# Patient Record
Sex: Female | Born: 1955
Health system: Southern US, Community
[De-identification: ages and names within clinical notes are randomized; demographics above are authoritative.]

## PROBLEM LIST (undated history)

## (undated) DIAGNOSIS — E872 Acidosis, unspecified: Secondary | ICD-10-CM

## (undated) DIAGNOSIS — R569 Unspecified convulsions: Secondary | ICD-10-CM

## (undated) DIAGNOSIS — F419 Anxiety disorder, unspecified: Secondary | ICD-10-CM

## (undated) DIAGNOSIS — I10 Essential (primary) hypertension: Secondary | ICD-10-CM

## (undated) DIAGNOSIS — R748 Abnormal levels of other serum enzymes: Secondary | ICD-10-CM

## (undated) DIAGNOSIS — G43909 Migraine, unspecified, not intractable, without status migrainosus: Secondary | ICD-10-CM

## (undated) DIAGNOSIS — G629 Polyneuropathy, unspecified: Secondary | ICD-10-CM

## (undated) DIAGNOSIS — M4316 Spondylolisthesis, lumbar region: Secondary | ICD-10-CM

## (undated) DIAGNOSIS — R2681 Unsteadiness on feet: Secondary | ICD-10-CM

## (undated) DIAGNOSIS — F329 Major depressive disorder, single episode, unspecified: Secondary | ICD-10-CM

## (undated) DIAGNOSIS — Z9889 Other specified postprocedural states: Secondary | ICD-10-CM

## (undated) DIAGNOSIS — G4730 Sleep apnea, unspecified: Secondary | ICD-10-CM

## (undated) DIAGNOSIS — N179 Acute kidney failure, unspecified: Secondary | ICD-10-CM

## (undated) DIAGNOSIS — E669 Obesity, unspecified: Secondary | ICD-10-CM

## (undated) DIAGNOSIS — E039 Hypothyroidism, unspecified: Secondary | ICD-10-CM

## (undated) DIAGNOSIS — R112 Nausea with vomiting, unspecified: Secondary | ICD-10-CM

## (undated) DIAGNOSIS — D649 Anemia, unspecified: Secondary | ICD-10-CM

## (undated) DIAGNOSIS — E2740 Unspecified adrenocortical insufficiency: Secondary | ICD-10-CM

## (undated) DIAGNOSIS — K589 Irritable bowel syndrome without diarrhea: Secondary | ICD-10-CM

## (undated) DIAGNOSIS — K8020 Calculus of gallbladder without cholecystitis without obstruction: Secondary | ICD-10-CM

## (undated) DIAGNOSIS — Z973 Presence of spectacles and contact lenses: Secondary | ICD-10-CM

## (undated) DIAGNOSIS — Z8739 Personal history of other diseases of the musculoskeletal system and connective tissue: Secondary | ICD-10-CM

## (undated) DIAGNOSIS — M1990 Unspecified osteoarthritis, unspecified site: Secondary | ICD-10-CM

## (undated) DIAGNOSIS — K859 Acute pancreatitis without necrosis or infection, unspecified: Secondary | ICD-10-CM

## (undated) DIAGNOSIS — K219 Gastro-esophageal reflux disease without esophagitis: Secondary | ICD-10-CM

## (undated) HISTORY — DX: Essential (primary) hypertension: I10

## (undated) HISTORY — PX: COLONOSCOPY W/ BIOPSIES AND POLYPECTOMY: SHX1376

## (undated) HISTORY — PX: NASAL SINUS SURGERY: SHX719

## (undated) HISTORY — DX: Irritable bowel syndrome, unspecified: K58.9

## (undated) HISTORY — PX: BACK SURGERY: SHX140

## (undated) HISTORY — DX: Gastro-esophageal reflux disease without esophagitis: K21.9

## (undated) HISTORY — DX: Unspecified osteoarthritis, unspecified site: M19.90

## (undated) HISTORY — DX: Hypothyroidism, unspecified: E03.9

## (undated) HISTORY — DX: Major depressive disorder, single episode, unspecified: F32.9

## (undated) HISTORY — DX: Obesity, unspecified: E66.9

## (undated) HISTORY — PX: ANKLE GANGLION CYST EXCISION: SHX1148

## (undated) HISTORY — PX: LAPAROSCOPIC CHOLECYSTECTOMY: SUR755

## (undated) HISTORY — DX: Sleep apnea, unspecified: G47.30

## (undated) HISTORY — PX: BREAST SURGERY: SHX581

## (undated) HISTORY — DX: Unspecified convulsions: R56.9

## (undated) HISTORY — DX: Calculus of gallbladder without cholecystitis without obstruction: K80.20

---

## 1990-11-27 HISTORY — PX: TUBAL LIGATION: SHX77

## 1999-12-15 ENCOUNTER — Other Ambulatory Visit: Admission: RE | Admit: 1999-12-15 | Discharge: 1999-12-15 | Payer: Self-pay | Admitting: *Deleted

## 2001-11-27 DIAGNOSIS — R569 Unspecified convulsions: Secondary | ICD-10-CM

## 2001-11-27 HISTORY — DX: Unspecified convulsions: R56.9

## 2002-08-20 ENCOUNTER — Emergency Department (HOSPITAL_COMMUNITY): Admission: EM | Admit: 2002-08-20 | Discharge: 2002-08-20 | Payer: Self-pay

## 2005-01-06 ENCOUNTER — Ambulatory Visit (HOSPITAL_COMMUNITY): Admission: RE | Admit: 2005-01-06 | Discharge: 2005-01-06 | Payer: Self-pay

## 2005-02-01 ENCOUNTER — Encounter: Admission: RE | Admit: 2005-02-01 | Discharge: 2005-02-01 | Payer: Self-pay | Admitting: Gastroenterology

## 2005-05-02 ENCOUNTER — Encounter (INDEPENDENT_AMBULATORY_CARE_PROVIDER_SITE_OTHER): Payer: Self-pay | Admitting: Specialist

## 2005-05-02 ENCOUNTER — Ambulatory Visit (HOSPITAL_COMMUNITY): Admission: RE | Admit: 2005-05-02 | Discharge: 2005-05-02 | Payer: Self-pay | Admitting: General Surgery

## 2006-02-22 ENCOUNTER — Encounter: Admission: RE | Admit: 2006-02-22 | Discharge: 2006-02-22 | Payer: Self-pay | Admitting: Neurology

## 2008-09-21 ENCOUNTER — Ambulatory Visit: Payer: Self-pay | Admitting: Gastroenterology

## 2008-10-05 ENCOUNTER — Ambulatory Visit: Payer: Self-pay | Admitting: Gastroenterology

## 2010-09-13 LAB — IFOBT (OCCULT BLOOD): IFOBT: NEGATIVE

## 2010-11-27 HISTORY — PX: BUNIONECTOMY: SHX129

## 2010-11-27 HISTORY — PX: SHOULDER ARTHROSCOPY W/ ROTATOR CUFF REPAIR: SHX2400

## 2010-11-27 HISTORY — PX: EYE SURGERY: SHX253

## 2011-04-14 NOTE — Op Note (Signed)
NAME:  Crystal Larsen, VALVANO NO.:  [id number]   MEDICAL RECORD NO.:  [id number]          PATIENT TYPE:  AMB   LOCATION:  DAY                          FACILITY:  [facility]   PHYSICIAN:  Adolph Pollack, M.D.DATE OF BIRTH:  1956-10-31   DATE OF PROCEDURE:  05/02/2005  DATE OF DISCHARGE:                                 OPERATIVE REPORT   PREOPERATIVE DIAGNOSIS:  Symptomatic cholelithiasis.   POSTOPERATIVE DIAGNOSIS:  Chronic cholecystitis with cholelithiasis and  choledocholithiasis.   PROCEDURE:  Laparoscopic cholecystectomy with intraoperative cholangiogram.   SURGEON:  Adolph Pollack, M.D.   ASSISTANT:  Lebron Conners, M.D.   ANESTHESIA:  General.   INDICATIONS:  Ms. Wallman is a 55 year old female having some epigastric pain.  She has undergone an evaluation and an ultrasound.  This demonstrated that  she had some gallstones and gallbladder sludge and had a slight abnormality  of one of her liver function tests.  She has also had an upper endoscopy  that showed some mild gastritis.  She now presents for elective laparoscopic  cholecystectomy.  The procedure and the risks were discussed with her  preoperatively.   TECHNIQUE:  She was seen in the holding area and brought to the operating  room and placed supine on the operating table, and a general anesthetic was  administered.  The abdominal wall was sterilely prepped and draped.  There  was a previous transverse subumbilical incision with a little fullness at  the umbilicus.  I injected some dilute Marcaine solution superficially and  deep at this location and reincised the transverse incision through the skin  and subcutaneous tissue.  An incision was made in the midline fascia and the  peritoneum, and the peritoneal cavity was entered under direct vision.  A  pursestring suture of 0 Vicryl was placed around the fascial edges.  A  Hasson trocar was introduced into the peritoneal cavity, and  pneumoperitoneum  was created by insufflation of CO2 gas.  A laparoscope was  then introduced.   The area under the trocar was inspected, and no injuries were noted.  She  was placed in reverse Trendelenburg position, with the right side tilted  slightly upward.  A 10/11 mm trocar was placed through an epigastric  incisions, and two 5 mm trocars placed in the right midlateral abdomen.  The  fundus of the gallbladder was grasped, and filmy adhesions between the  gallbladder, duodenum, and omentum were lysed sharply.  The infundibulum was  grasped using blunt dissection, staying on the gallbladder.  The  infundibulum was mobilized.  Common bile duct was identified.  The cystic  duct was isolated and a window created around it.  There appeared to be a  stone passing the cystic duct, and I tried to milk this up into the  gallbladder.  A clip was placed at the cystic duct/gallbladder junction.  A  small incision was made in the cystic duct, and upon milking it more debris  and small stones were noted.  A cholangiocatheter was then passed through  the anterior abdominal wall and put into the cystic duct, and  cholangiogram  was performed.   Under real-time fluoroscopy, dilute contrast material was injected into the  cystic duct which was of moderate length.  Common hepatic and the right and  left hepatic ducts filled.  Common bile duct filled but had a small filling  defect present in it.  It tapered down, but I could not get it to directly  splash into the duodenum.  The small filling defect was consistent with a  small common bile duct stone, millimeters in size at most.  I subsequently  gave her some intravenous Glucagon and waited two or three minutes, as I  repeated the fluoroscopy and the contrast had splashed through.  I injected  more contrast, and it went through easily without any further evidence of a  further defect.   Following this, I removed the cholangiocatheter, clipped the cystic duct  three  times proximally, and divided it.  I then used blunt dissection to  identify an anterior and posterior branch of the cystic artery close to the  gallbladder, clipped them, and divided them.  The gallbladder was dissected  free from the liver bed intact using electrocautery and placed it in an  Endopouch bag.  The gallbladder fossa was irrigated, and bleeding points  were controlled with cautery.  I inspected the area, evacuated the  irrigation fluid, and noticed no bleeding.  I noticed no bile leak.  The  gallbladder was then removed through the subumbilical port.  I did not  detect an obvious fascial defect, just a fullness at the umbilicus.  I then  closed the fascial defect under laparoscopic vision by tightening up and  tying down the pursestring suture.  The perihepatic area was once again  irrigated.  Fluid returned clear, without evidence of bile leak or bleeding.  The remaining trocars were removed, and the pneumoperitoneum was released.  The skin incisions were closed with 4-0 Monocryl subcuticular stitches,  followed by Steri-Strips and sterile dressings.   She tolerated the procedure well, without any apparent complications, and  was taken to the recovery room in satisfactory condition.      TJR/MEDQ  D:  05/02/2005  T:  05/02/2005  Job:  [id number]   cc:   Anselmo Rod, M.D.  [address]  [address]  [place]  [place] [address]  Fax: [phone]   Gloriajean Dell. Andrey Campanile, M.D.  [address]  [place]  [place] [address]  Fax: [phone]   Adolph Pollack, M.D.  [address]  [place]  [place] [address]

## 2011-09-07 ENCOUNTER — Other Ambulatory Visit (HOSPITAL_COMMUNITY): Payer: Self-pay | Admitting: Orthopedic Surgery

## 2011-09-07 ENCOUNTER — Encounter (HOSPITAL_COMMUNITY)
Admission: RE | Admit: 2011-09-07 | Discharge: 2011-09-07 | Disposition: A | Discharge status: Home / Self Care | Payer: 59 | Source: Ambulatory Visit | Attending: Orthopedic Surgery | Admitting: Orthopedic Surgery

## 2011-09-07 DIAGNOSIS — M7540 Impingement syndrome of unspecified shoulder: Secondary | ICD-10-CM

## 2011-09-07 LAB — COMPREHENSIVE METABOLIC PANEL
ALT: 22 U/L (ref 0–35)
CO2: 27 mEq/L (ref 19–32)
Calcium: 10.1 mg/dL (ref 8.4–10.5)
GFR calc Af Amer: 70 mL/min — ABNORMAL LOW (ref >90)
GFR calc non Af Amer: 61 mL/min — ABNORMAL LOW (ref >90)
Glucose, Bld: 93 mg/dL (ref 70–99)
Sodium: 138 mEq/L (ref 135–145)
Total Bilirubin: 0.5 mg/dL (ref 0.3–1.2)

## 2011-09-07 LAB — CBC
MCV: 84 fL (ref 78.0–100.0)
Platelets: 232 10*3/uL (ref 150–400)
RBC: 4.87 MIL/uL (ref 3.87–5.11)
RDW: 13.7 % (ref 11.5–15.5)
WBC: 6.2 10*3/uL (ref 4.0–10.5)

## 2011-09-07 LAB — URINALYSIS, ROUTINE W REFLEX MICROSCOPIC
Bilirubin Urine: NEGATIVE
Leukocytes, UA: NEGATIVE
Nitrite: NEGATIVE
Specific Gravity, Urine: 1.013 (ref 1.005–1.030)
Urobilinogen, UA: 1 mg/dL (ref 0.0–1.0)
pH: 6.5 (ref 5.0–8.0)

## 2011-09-14 ENCOUNTER — Ambulatory Visit (HOSPITAL_COMMUNITY)
Admission: RE | Admit: 2011-09-14 | Discharge: 2011-09-14 | Disposition: A | Discharge status: Home / Self Care | Payer: 59 | Source: Ambulatory Visit | Attending: Orthopedic Surgery | Admitting: Orthopedic Surgery

## 2011-09-14 DIAGNOSIS — G8929 Other chronic pain: Secondary | ICD-10-CM | POA: Insufficient documentation

## 2011-09-14 DIAGNOSIS — M67919 Unspecified disorder of synovium and tendon, unspecified shoulder: Secondary | ICD-10-CM | POA: Insufficient documentation

## 2011-09-14 DIAGNOSIS — Z01818 Encounter for other preprocedural examination: Secondary | ICD-10-CM | POA: Insufficient documentation

## 2011-09-14 DIAGNOSIS — Z01810 Encounter for preprocedural cardiovascular examination: Secondary | ICD-10-CM | POA: Insufficient documentation

## 2011-09-14 DIAGNOSIS — Z01812 Encounter for preprocedural laboratory examination: Secondary | ICD-10-CM | POA: Insufficient documentation

## 2011-09-14 DIAGNOSIS — M25819 Other specified joint disorders, unspecified shoulder: Secondary | ICD-10-CM | POA: Insufficient documentation

## 2011-09-14 DIAGNOSIS — M719 Bursopathy, unspecified: Secondary | ICD-10-CM | POA: Insufficient documentation

## 2011-09-14 DIAGNOSIS — M19019 Primary osteoarthritis, unspecified shoulder: Secondary | ICD-10-CM | POA: Insufficient documentation

## 2011-09-26 NOTE — Op Note (Signed)
Crystal Larsen, Crystal Larsen NO.:  [id number]  MEDICAL RECORD NO.:  [id number]  LOCATION:  [facility]SC                         FACILITY:  [facility]  PHYSICIAN:  Vania Rea. Sabin Gibeault, M.D.  DATE OF BIRTH:  1956-05-10  DATE OF PROCEDURE:  09/14/2011 DATE OF DISCHARGE:  09/14/2011                              OPERATIVE REPORT   PREOPERATIVE DIAGNOSES: 1. Chronic right shoulder impingement syndrome. 2. Right shoulder acromioclavicular joint arthropathy.  POSTOPERATIVE DIAGNOSES: 1. Chronic right shoulder impingement syndrome. 2. Right shoulder acromioclavicular joint arthropathy. 3. Right shoulder full-thickness rotator cuff tear. 4. Right shoulder labral tear.  PROCEDURES: 1. Right shoulder examination under anesthesia. 2. Right shoulder glenohumeral joint diagnostic arthroscopy. 3. Debridement of complex and extensive labral tear. 4. Arthroscopic subacromial decompression and bursectomy. 5. Arthroscopic distal clavicle resection. 6. Arthroscopic rotator cuff repair using a double row suture bridge     repair construct.  SURGEON:  Vania Rea. Vardaan Depascale, MD  ASSISTANT:  Lucita Lora. Shuford, PA-C  ANESTHESIA:  General endotracheal as well as an interscalene block.  ESTIMATED BLOOD LOSS:  Minimal.  DRAINS:  None.  HISTORY:  Crystal Larsen is a 55 year old female who has had chronic and progressively increasing right shoulder pain with weakness and restricted mobility that had been refractory to prolonged attempts at conservative management.  Her preop x-ray showed advanced AC joint arthrosis with an MRI scan showing tendinosis of the rotator cuff but no obvious discrete full-thickness tears.  Due to her ongoing pain and functional limitations, she is brought to the operating room at this time for planned right shoulder arthroscopy as described below.  Preoperatively, we counseled Crystal Larsen on treatment options as well as risks versus benefits thereof.  Possible surgical complications  were reviewed including potential for bleeding, infection, neurovascular injury, persistent pain, loss of motion, anesthetic complication, recurrence of rotator cuff tear, and possible need for additional surgery.  She understands and accepts and agrees with the plan and procedure.  PROCEDURE IN DETAIL:  After undergoing routine preop evaluation, the patient received prophylactic antibiotics and an interscalene block was established in the holding area by the Anesthesia Department.  Placed supine on the operating table and underwent smooth induction of general endotracheal anesthesia.  Turned to the left lateral decubitus position on a beanbag and appropriately padded and protected.  Right shoulder examination under anesthesia reveals a modest restriction of mobility at 150 degrees of forward elevation with firm endpoint.  Gentle manipulation was performed achieving 170 degrees forward elevation as well as 80 degrees of external and internal rotation.  Right arm suspended at 70 degrees and abduction with 10 pounds of traction,  theright shoulder region was sterilely prepped and draped in a standard fashion.  Time-out was called.  I should mention that Crystal Bathe, PA- C was utilized throughout this case as surgical assistant and essential for completion of the procedure, assistance in positioning the extremity, utilizing the arthroscope, passing sutures, wound closure, and intraoperative decision making.  Posterior portal was established in the glenohumeral joint and the anterior portal was established under direct visualization.  The glenohumeral joint surfaces were all in excellent condition.  No instability patterns noted.  There was an extensive tear of  the superior labrum, degenerative in nature, consistent with type 1 SLAP lesion.  This was all debrided with tear to a stable margin and then the Stryker wand was used to obtain hemostasis. The biceps anchor was stable and biceps  tendon normal caliber.  Rotator cuff showed significant articular-sided tearing.  This area was debrided with a shaver and this was concerning for possible full-thickness tear. We did pass a tag suture of 0-PDS at this point involving the distal supraspinatus at its junction with the infraspinatus.  We then completed the glenohumeral joint inspection.  Fluids and instrument were removed. The arm was dropped down to 30 degrees of abduction with the arthroscope introduced in the subacromial space and posterior portal.  Direct lateral portal was established in the subacromial space.  Abundant dense bursal tissue, multiple adhesions were encountered and these were divided and excised with a combination of the shaver and the Stryker wand.  The wand was then used to remove the periosteum from the undersurface of the anterior half of the acromion.  The subacromial decompression was performed with bur creating a type 1 morphology.  The port was then established directly anterior to the distal clavicle and distal clavicle resection was performed with the bur.  Care was taken to confirm visualization of the entire circumference of the distal clavicle to ensure adequate removal of bone.  We then completed the subacromial/subdeltoid bursectomy.  The bursal surface rotator cuff showed severe attenuation and fraying and at the area of the tag suture, there was indeed a full-thickness tear.  We went ahead and debrided the rotator cuff back to a stable healthy appearing margin and ultimately had a defect of approximately 2 cm in width.  The greater tuberosity was then prepared with the Stryker wand and gently abraded with a bur. Accessory portal of Neviaser was established and through a stable lateral margin of the acromion placed an Arthrex PEEK corkscrew suture anchor.  Limbs of the suture anchor were then passed to adjacent margin of the rotator cuff in a horizontal mattress pattern and then tied  with sliding locking knots followed by multiple overhand throws and alternating posts.  We then created "suture bridge" with 2 PushLock suture anchors which nicely reinforced the repair and compressed the margin of the rotator cuff against the bony bed and tuberosity.  The overall repair construct was much to our satisfaction with good stability and  reapposition at the tuberosity.  Suture limbs were clipped.  Fluid and instruments were removed.  Portals closed with Monocryl and Steri-Strips.  A bulky dry dressing was taped at the right shoulder, right arm was placed in a sling immobilizer.  The patient was awakened, extubated, and taken to the recovery room in stable condition.     Vania Rea. Baylea Milburn, M.D.    KMS/MEDQ  D:  09/14/2011  T:  09/14/2011  Job:  [id number]  Electronically Signed by Francena Hanly M.D. on 09/26/2011 05:05:02 PM

## 2011-09-28 ENCOUNTER — Encounter: Payer: Self-pay | Admitting: Gastroenterology

## 2011-09-28 ENCOUNTER — Ambulatory Visit (INDEPENDENT_AMBULATORY_CARE_PROVIDER_SITE_OTHER): Payer: 59 | Admitting: Gastroenterology

## 2011-09-28 VITALS — BP 102/70 | HR 88 | Ht 67.5 in | Wt 203.0 lb

## 2011-09-28 DIAGNOSIS — K219 Gastro-esophageal reflux disease without esophagitis: Secondary | ICD-10-CM

## 2011-09-28 DIAGNOSIS — R195 Other fecal abnormalities: Secondary | ICD-10-CM

## 2011-09-28 DIAGNOSIS — K589 Irritable bowel syndrome without diarrhea: Secondary | ICD-10-CM

## 2011-09-28 DIAGNOSIS — G43909 Migraine, unspecified, not intractable, without status migrainosus: Secondary | ICD-10-CM

## 2011-09-28 DIAGNOSIS — G43109 Migraine with aura, not intractable, without status migrainosus: Secondary | ICD-10-CM

## 2011-09-28 NOTE — Progress Notes (Signed)
History of Present Illness:  This is a 55 year old Caucasian female seen in the past for screening colonoscopy. Recent exam by her primary care physician showed guaiac positive stool. Patient does have chronic constipation but denies hemorrhoidal bleeding. Also has had previous GI workup by Dr. Loreta Ave with a negative colonoscopy in 2006. 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 She cares a long-term diagnosis of IBS with gas and bloating, and has periodic episodes of severe upper GI discomfort every 3-4 years of unexplained etiology. Last severe attack was on July 2 of this year. Is chronically on Protonix, aspirin 81 mg a day, Celebrex 200 mg a day, and Depacon 1000 mg twice a day, Synthroid, and Prinivil and Effexor.  I have reviewed this patient's present history, medical and surgical past history, allergies and medications.     ROS: The remainder of the 10 point ROS is negative.. does have chronic depression, history of recurrent kidney stones, obesity, sleep apnea, visual migraines, rub or myalgia, and has had previous cholecystectomy in 2006. She complains of chronic arthritis and back pain and chronic insomnia. Denies abuse of alcohol, cigarettes, or other NSAIDs.   Past Medical History  Diagnosis Date  . GERD (gastroesophageal reflux disease)   . Chronic headaches   . Hyperthyroidism   . Anxiety and depression   . Arthritis   . Gallstones   . HTN (hypertension)   . Hypothyroidism   . IBS (irritable bowel syndrome)     ?  . Obesity   . Seizures   . Sleep apnea    Past Surgical History  Procedure Date  . Cholecystectomy   . Nasal sinus surgery   . Ganglian cyst right ankle   . Cesarean section   . Bunionectomy     left  . Rotator cuff repair     right    does not have a smoking history on file. She has never used smokeless tobacco. She reports that she drinks alcohol. She reports that she does not use illicit  drugs. family history includes Alcohol abuse in her maternal grandfather; Anxiety disorder in her sister; Asthma in her mother; Fibromyalgia in her sister; Heart disease in her maternal grandfather; Hyperlipidemia in her maternal grandfather; Lung cancer in her mother; and Nephrolithiasis in her daughter. Allergies  Allergen Reactions  . Erythromycin     REACTION: rash  . Sulfonamide Derivatives     REACTION: rash       Physical Exam: General well developed well nourished patient in no acute distress, appearing her stated age Eyes PERRLA, no icterus, fundoscopic exam per opthamologist Skin no lesions noted Neck supple, no adenopathy, no thyroid enlargement, no tenderness Chest clear to percussion and auscultation Heart no significant murmurs, gallops or rubs noted Abdomen no hepatosplenomegaly masses or tenderness, BS normal. . Extremities no acute joint lesions, edema, phlebitis or evidence of cellulitis. Neurologic patient oriented x 3, cranial nerves intact, no focal neurologic deficits noted. Psychological mental status normal and normal affect.  Assessment and plan: Chronic IBS and probable hemorrhoidal bleeding, rule out colonic polyposis underlying IBD. I have scheduled her for colonoscopy with propofol sedation. Also because of her chronic GERD we'll perform endoscopic exam. I have reviewed antireflux regime with her also. I reviewed all of her medications including her Protonix . Continue other medications per Dr. Warrick Parisian. I suggest a high-fiber diet as tolerated with liberal by mouth fluids.  No diagnosis found.

## 2011-09-28 NOTE — Patient Instructions (Addendum)
It has been recommended that you have a Colonoscopy/Endoscopy with Propoful scheduled with Dr Mosetta Pigeon will need to contact the office at [phone] when you are ready to schedule At that time you will be scheduled for your procedures along with a Previsit Nurse to sign paperwork and get all instructions

## 2011-09-29 ENCOUNTER — Encounter: Payer: Self-pay | Admitting: Gastroenterology

## 2011-10-03 ENCOUNTER — Ambulatory Visit (AMBULATORY_SURGERY_CENTER): Payer: 59

## 2011-10-03 ENCOUNTER — Encounter: Payer: Self-pay | Admitting: Gastroenterology

## 2011-10-03 VITALS — Ht 67.5 in | Wt 201.0 lb

## 2011-10-03 DIAGNOSIS — R195 Other fecal abnormalities: Secondary | ICD-10-CM

## 2011-10-03 DIAGNOSIS — K219 Gastro-esophageal reflux disease without esophagitis: Secondary | ICD-10-CM

## 2011-10-03 DIAGNOSIS — K589 Irritable bowel syndrome without diarrhea: Secondary | ICD-10-CM

## 2011-10-03 MED ORDER — PEG-KCL-NACL-NASULF-NA ASC-C 100 G PO SOLR: 1.0000 | Freq: Once | ORAL | Status: AC

## 2011-10-03 NOTE — Progress Notes (Signed)
Pt states she had a endoscopy by Dr Loreta Ave in 2006. A medical release form was filled out and was given to [facility]. Ulis Rias RN

## 2011-10-11 ENCOUNTER — Encounter: Payer: Self-pay | Admitting: Gastroenterology

## 2011-10-11 ENCOUNTER — Ambulatory Visit (AMBULATORY_SURGERY_CENTER): Payer: 59 | Admitting: Gastroenterology

## 2011-10-11 DIAGNOSIS — K2940 Chronic atrophic gastritis without bleeding: Secondary | ICD-10-CM

## 2011-10-11 DIAGNOSIS — R195 Other fecal abnormalities: Secondary | ICD-10-CM

## 2011-10-11 DIAGNOSIS — D126 Benign neoplasm of colon, unspecified: Secondary | ICD-10-CM | POA: Insufficient documentation

## 2011-10-11 DIAGNOSIS — Z1211 Encounter for screening for malignant neoplasm of colon: Secondary | ICD-10-CM

## 2011-10-11 DIAGNOSIS — K2950 Unspecified chronic gastritis without bleeding: Secondary | ICD-10-CM | POA: Insufficient documentation

## 2011-10-11 DIAGNOSIS — K589 Irritable bowel syndrome without diarrhea: Secondary | ICD-10-CM

## 2011-10-11 DIAGNOSIS — K219 Gastro-esophageal reflux disease without esophagitis: Secondary | ICD-10-CM

## 2011-10-11 MED ORDER — SODIUM CHLORIDE 0.9 % IV SOLN: 500.0000 mL | INTRAVENOUS | Status: DC

## 2011-10-11 NOTE — Progress Notes (Signed)
Patient did not experience any of the following events: a burn prior to discharge; a fall within the facility; wrong site/side/patient/procedure/implant event; or a hospital transfer or hospital admission upon discharge from the facility. (G8907) Patient did not have preoperative order for IV antibiotic SSI prophylaxis. (G8918)  

## 2011-10-11 NOTE — Patient Instructions (Signed)
Green and blue discharge instructions reviewed with patient and care partner.  Impressions/recommendations:  Polyp (handout given)  Repeat colonoscopy will be dependent on pathology results of polyp.  Gastritis (handout given)  Resume medications as you had been taking them prior to your procedure.

## 2011-10-11 NOTE — Progress Notes (Signed)
Pt post op 4 weeks shoulder surgery on right, complaining of shoulder hurting 5/10 and stated that she would feel better if she could be on her back with her head elevated. After repositioning she stated that her shoulder pain decreased 2-3/10.

## 2011-10-12 ENCOUNTER — Telehealth: Payer: Self-pay

## 2011-10-12 NOTE — Telephone Encounter (Signed)

## 2011-10-17 ENCOUNTER — Encounter: Payer: Self-pay | Admitting: Gastroenterology

## 2013-02-25 ENCOUNTER — Telehealth: Payer: Self-pay | Admitting: Neurology

## 2013-02-25 NOTE — Telephone Encounter (Signed)
This is an  xpected result of weaning off antidepressants- if this was done to prepare for a sleep stdy , she needs the sleep test ASAP to restart the medications. Or similar meds for a depression/ mood.

## 2013-02-25 NOTE — Telephone Encounter (Signed)
Pt called and has finally finished her taper of pristiq.  She discontinued this over 4 wks.   (missed the last 1/2 tab every third day over 2 wks).  Has now been off for 1 wk.  Noted since sx: dizziness, transient electrical zaps, crying/yelling, slamming doors.  Her questions: is this normal? And how long will this last?  Who does she need to see? Please advise.   LMVM is ok.  [phone].  Allergies: erythromycin, and sulfa.  [facility].

## 2013-02-26 NOTE — Telephone Encounter (Signed)
I called pt and relayed normal effects of coming off depressants.  Duration of these feeling (approximate time frame??)

## 2013-03-06 NOTE — Telephone Encounter (Signed)
I called and LMVM for pt on [phone] and relayed after speaking with Dr. Vickey Huger that expected sx after weaning off antidepressants an last up to 30 days.  If things persist after this time frame then to see pcp.   To call back if questions.

## 2013-03-11 ENCOUNTER — Other Ambulatory Visit: Payer: Self-pay | Admitting: Neurology

## 2013-03-18 ENCOUNTER — Other Ambulatory Visit: Payer: Self-pay | Admitting: Radiology

## 2013-05-27 HISTORY — PX: POSTERIOR LUMBAR FUSION: SHX6036

## 2013-06-05 ENCOUNTER — Other Ambulatory Visit: Payer: Self-pay | Admitting: Neurosurgery

## 2013-06-12 ENCOUNTER — Encounter (HOSPITAL_COMMUNITY): Payer: Self-pay

## 2013-06-20 ENCOUNTER — Encounter (HOSPITAL_COMMUNITY)
Admission: RE | Admit: 2013-06-20 | Discharge: 2013-06-20 | Disposition: A | Discharge status: Home / Self Care | Payer: 59 | Source: Ambulatory Visit | Attending: Anesthesiology | Admitting: Anesthesiology

## 2013-06-20 ENCOUNTER — Other Ambulatory Visit (HOSPITAL_COMMUNITY): Payer: 59

## 2013-06-20 ENCOUNTER — Encounter (HOSPITAL_COMMUNITY): Payer: Self-pay

## 2013-06-20 ENCOUNTER — Encounter (HOSPITAL_COMMUNITY)
Admission: RE | Admit: 2013-06-20 | Discharge: 2013-06-20 | Disposition: A | Discharge status: Home / Self Care | Payer: 59 | Source: Ambulatory Visit | Attending: Neurosurgery | Admitting: Neurosurgery

## 2013-06-20 DIAGNOSIS — Z01812 Encounter for preprocedural laboratory examination: Secondary | ICD-10-CM | POA: Insufficient documentation

## 2013-06-20 DIAGNOSIS — Z01818 Encounter for other preprocedural examination: Secondary | ICD-10-CM | POA: Insufficient documentation

## 2013-06-20 DIAGNOSIS — Z01810 Encounter for preprocedural cardiovascular examination: Secondary | ICD-10-CM | POA: Insufficient documentation

## 2013-06-20 HISTORY — DX: Other specified postprocedural states: Z98.890

## 2013-06-20 HISTORY — DX: Nausea with vomiting, unspecified: R11.2

## 2013-06-20 LAB — BASIC METABOLIC PANEL
Calcium: 10 mg/dL (ref 8.4–10.5)
GFR calc non Af Amer: 57 mL/min — ABNORMAL LOW (ref >90)
Sodium: 136 mEq/L (ref 135–145)

## 2013-06-20 LAB — CBC
MCH: 30.3 pg (ref 26.0–34.0)
Platelets: 231 10*3/uL (ref 150–400)
RBC: 4.95 MIL/uL (ref 3.87–5.11)
WBC: 5.9 10*3/uL (ref 4.0–10.5)

## 2013-06-20 LAB — ABO/RH: ABO/RH(D): O POS

## 2013-06-20 LAB — SURGICAL PCR SCREEN: Staphylococcus aureus: NEGATIVE

## 2013-06-20 NOTE — Pre-Procedure Instructions (Addendum)
Crystal Larsen  06/20/2013   Your procedure is scheduled on:  06/25/13  Report to [facility] at 630 AM.  Call this number if you have problems the morning of surgery: [phone]   Remember:   Do not eat food or drink liquids after midnight.   Take these medicines the morning of surgery with A SIP OF WATER: pristiq,depakote, levothyroxine, pain med, protonix, hormone           STOP aspirin, fish oil, celebrex now   Do not wear jewelry, make-up or nail polish.  Do not wear lotions, powders, or perfumes. You may wear deodorant.  Do not shave 48 hours prior to surgery. Men may shave face and neck.  Do not bring valuables to the hospital.  [facility] is not responsible                   for any belongings or valuables.  Contacts, dentures or bridgework may not be worn into surgery.  Leave suitcase in the car. After surgery it may be brought to your room.  For patients admitted to the hospital, checkout time is 11:00 AM the day of  discharge.   Patients discharged the day of surgery will not be allowed to drive  home.  Name and phone number of your driver:   Special Instructions: Shower using CHG 2 nights before surgery and the night before surgery.  If you shower the day of surgery use CHG.  Use special wash - you have one bottle of CHG for all showers.  You should use approximately 1/3 of the bottle for each shower.   Please read over the following fact sheets that you were given: Pain Booklet, Coughing and Deep Breathing, Blood Transfusion Information, MRSA Information and Surgical Site Infection Prevention

## 2013-06-24 MED ORDER — CEFAZOLIN SODIUM-DEXTROSE 2-3 GM-% IV SOLR
2.0000 g | INTRAVENOUS | Status: AC
  Administered 2013-06-25: 2 g via INTRAVENOUS

## 2013-06-25 ENCOUNTER — Inpatient Hospital Stay (HOSPITAL_COMMUNITY)
Admission: RE | Admit: 2013-06-25 | Discharge: 2013-07-02 | DRG: 460 | Disposition: A | Discharge status: Home / Self Care | Payer: 59 | Source: Ambulatory Visit | Attending: Neurosurgery | Admitting: Neurosurgery

## 2013-06-25 ENCOUNTER — Encounter (HOSPITAL_COMMUNITY): Payer: Self-pay | Admitting: Surgery

## 2013-06-25 ENCOUNTER — Ambulatory Visit (HOSPITAL_COMMUNITY): Payer: 59

## 2013-06-25 ENCOUNTER — Ambulatory Visit (HOSPITAL_COMMUNITY): Payer: 59 | Admitting: Anesthesiology

## 2013-06-25 ENCOUNTER — Encounter (HOSPITAL_COMMUNITY): Payer: Self-pay | Admitting: Anesthesiology

## 2013-06-25 ENCOUNTER — Encounter (HOSPITAL_COMMUNITY)
Admission: RE | Disposition: A | Discharge status: Home / Self Care | Payer: Self-pay | Source: Ambulatory Visit | Attending: Neurosurgery

## 2013-06-25 DIAGNOSIS — Z7982 Long term (current) use of aspirin: Secondary | ICD-10-CM

## 2013-06-25 DIAGNOSIS — M48061 Spinal stenosis, lumbar region without neurogenic claudication: Principal | ICD-10-CM | POA: Diagnosis present

## 2013-06-25 DIAGNOSIS — G4730 Sleep apnea, unspecified: Secondary | ICD-10-CM | POA: Diagnosis present

## 2013-06-25 DIAGNOSIS — R350 Frequency of micturition: Secondary | ICD-10-CM | POA: Diagnosis present

## 2013-06-25 DIAGNOSIS — F411 Generalized anxiety disorder: Secondary | ICD-10-CM | POA: Diagnosis present

## 2013-06-25 DIAGNOSIS — Z885 Allergy status to narcotic agent status: Secondary | ICD-10-CM

## 2013-06-25 DIAGNOSIS — F329 Major depressive disorder, single episode, unspecified: Secondary | ICD-10-CM | POA: Diagnosis present

## 2013-06-25 DIAGNOSIS — M62838 Other muscle spasm: Secondary | ICD-10-CM | POA: Diagnosis present

## 2013-06-25 DIAGNOSIS — K219 Gastro-esophageal reflux disease without esophagitis: Secondary | ICD-10-CM | POA: Diagnosis present

## 2013-06-25 DIAGNOSIS — K589 Irritable bowel syndrome without diarrhea: Secondary | ICD-10-CM | POA: Diagnosis present

## 2013-06-25 DIAGNOSIS — Z882 Allergy status to sulfonamides status: Secondary | ICD-10-CM

## 2013-06-25 DIAGNOSIS — I10 Essential (primary) hypertension: Secondary | ICD-10-CM | POA: Diagnosis present

## 2013-06-25 DIAGNOSIS — E0590 Thyrotoxicosis, unspecified without thyrotoxic crisis or storm: Secondary | ICD-10-CM | POA: Diagnosis present

## 2013-06-25 DIAGNOSIS — E669 Obesity, unspecified: Secondary | ICD-10-CM | POA: Diagnosis present

## 2013-06-25 DIAGNOSIS — Q762 Congenital spondylolisthesis: Secondary | ICD-10-CM

## 2013-06-25 DIAGNOSIS — Z881 Allergy status to other antibiotic agents status: Secondary | ICD-10-CM

## 2013-06-25 DIAGNOSIS — F3289 Other specified depressive episodes: Secondary | ICD-10-CM | POA: Diagnosis present

## 2013-06-25 LAB — CBC
Hemoglobin: 10.7 g/dL — ABNORMAL LOW (ref 12.0–15.0)
MCH: 30.7 pg (ref 26.0–34.0)
RBC: 3.49 MIL/uL — ABNORMAL LOW (ref 3.87–5.11)

## 2013-06-25 LAB — TYPE AND SCREEN: ABO/RH(D): O POS

## 2013-06-25 SURGERY — POSTERIOR LUMBAR FUSION 1 LEVEL
Anesthesia: General | Site: Back | Wound class: Clean

## 2013-06-25 MED ORDER — SENNA 8.6 MG PO TABS
1.0000 | ORAL_TABLET | Freq: Two times a day (BID) | ORAL | Status: DC
  Administered 2013-06-25 – 2013-07-02 (×13): 8.6 mg via ORAL
  Filled 2013-06-25 (×20): qty 1

## 2013-06-25 MED ORDER — LISINOPRIL 40 MG PO TABS
40.0000 mg | ORAL_TABLET | Freq: Every day | ORAL | Status: DC
  Administered 2013-06-25 – 2013-07-02 (×5): 40 mg via ORAL
  Filled 2013-06-25 (×8): qty 1

## 2013-06-25 MED ORDER — VECURONIUM BROMIDE 10 MG IV SOLR
INTRAVENOUS | Status: DC | PRN
  Administered 2013-06-25: 1 mg via INTRAVENOUS
  Administered 2013-06-25: 3 mg via INTRAVENOUS
  Administered 2013-06-25: 2 mg via INTRAVENOUS
  Administered 2013-06-25: 4 mg via INTRAVENOUS
  Administered 2013-06-25: 3 mg via INTRAVENOUS

## 2013-06-25 MED ORDER — ALBUMIN HUMAN 5 % IV SOLN
12.5000 g | Freq: Once | INTRAVENOUS | Status: AC
  Administered 2013-06-25: 12.5 g via INTRAVENOUS

## 2013-06-25 MED ORDER — ESTRADIOL 0.05 MG/24HR TD PTWK
0.0500 mg | MEDICATED_PATCH | TRANSDERMAL | Status: DC
  Filled 2013-06-25 (×2): qty 1

## 2013-06-25 MED ORDER — MORPHINE SULFATE (PF) 1 MG/ML IV SOLN
INTRAVENOUS | Status: DC
  Administered 2013-06-25: 12 mg via INTRAVENOUS
  Administered 2013-06-25: 3 mg via INTRAVENOUS
  Administered 2013-06-25: 13:00:00 via INTRAVENOUS
  Administered 2013-06-26: 4.5 mg via INTRAVENOUS
  Administered 2013-06-26: 25 mg via INTRAVENOUS
  Administered 2013-06-26: 3 mg via INTRAVENOUS
  Filled 2013-06-25: qty 25

## 2013-06-25 MED ORDER — FENTANYL CITRATE 0.05 MG/ML IJ SOLN
INTRAMUSCULAR | Status: DC | PRN
  Administered 2013-06-25: 50 ug via INTRAVENOUS
  Administered 2013-06-25: 100 ug via INTRAVENOUS
  Administered 2013-06-25: 25 ug via INTRAVENOUS
  Administered 2013-06-25: 100 ug via INTRAVENOUS
  Administered 2013-06-25: 50 ug via INTRAVENOUS

## 2013-06-25 MED ORDER — TRIAMTERENE-HCTZ 37.5-25 MG PO TABS
0.5000 | ORAL_TABLET | Freq: Every day | ORAL | Status: DC
  Administered 2013-06-28: 10:00:00 via ORAL
  Administered 2013-06-29 – 2013-07-02 (×4): 0.5 via ORAL
  Filled 2013-06-25 (×7): qty 0.5

## 2013-06-25 MED ORDER — MORPHINE SULFATE (PF) 1 MG/ML IV SOLN
INTRAVENOUS | Status: AC
  Filled 2013-06-25: qty 25

## 2013-06-25 MED ORDER — MIDAZOLAM HCL 5 MG/5ML IJ SOLN
INTRAMUSCULAR | Status: DC | PRN
  Administered 2013-06-25: 2 mg via INTRAVENOUS

## 2013-06-25 MED ORDER — THROMBIN 20000 UNITS EX SOLR
CUTANEOUS | Status: DC | PRN
  Administered 2013-06-25: 09:00:00 via TOPICAL

## 2013-06-25 MED ORDER — PHENOL 1.4 % MT LIQD: 1.0000 | OROMUCOSAL | Status: DC | PRN

## 2013-06-25 MED ORDER — ESTRADIOL 0.05 MG/24HR TD PTWK: 0.0500 mg | MEDICATED_PATCH | TRANSDERMAL | Status: DC

## 2013-06-25 MED ORDER — DIAZEPAM 5 MG PO TABS
5.0000 mg | ORAL_TABLET | Freq: Four times a day (QID) | ORAL | Status: DC | PRN
  Administered 2013-06-26 – 2013-06-28 (×5): 5 mg via ORAL
  Filled 2013-06-25 (×5): qty 1

## 2013-06-25 MED ORDER — NALOXONE HCL 0.4 MG/ML IJ SOLN: 0.4000 mg | INTRAMUSCULAR | Status: DC | PRN

## 2013-06-25 MED ORDER — CEFAZOLIN SODIUM-DEXTROSE 2-3 GM-% IV SOLR
INTRAVENOUS | Status: AC
  Filled 2013-06-25: qty 50

## 2013-06-25 MED ORDER — PROGESTERONE MICRONIZED 100 MG PO CAPS
100.0000 mg | ORAL_CAPSULE | Freq: Every day | ORAL | Status: DC
  Administered 2013-06-26 – 2013-07-02 (×7): 100 mg via ORAL
  Filled 2013-06-25 (×7): qty 1

## 2013-06-25 MED ORDER — MENTHOL 3 MG MT LOZG
1.0000 | LOZENGE | OROMUCOSAL | Status: DC | PRN
  Filled 2013-06-25: qty 9

## 2013-06-25 MED ORDER — NEOSTIGMINE METHYLSULFATE 1 MG/ML IJ SOLN
INTRAMUSCULAR | Status: DC | PRN
  Administered 2013-06-25: 4 mg via INTRAVENOUS

## 2013-06-25 MED ORDER — SODIUM CHLORIDE 0.9 % IJ SOLN: 3.0000 mL | INTRAMUSCULAR | Status: DC | PRN

## 2013-06-25 MED ORDER — CEFAZOLIN SODIUM 1-5 GM-% IV SOLN
1.0000 g | Freq: Three times a day (TID) | INTRAVENOUS | Status: AC
  Administered 2013-06-25 – 2013-06-26 (×2): 1 g via INTRAVENOUS
  Filled 2013-06-25 (×2): qty 50

## 2013-06-25 MED ORDER — EPHEDRINE SULFATE 50 MG/ML IJ SOLN
5.0000 mg | Freq: Once | INTRAMUSCULAR | Status: AC
  Administered 2013-06-25: 5 mg via INTRAVENOUS

## 2013-06-25 MED ORDER — PHENYLEPHRINE HCL 10 MG/ML IJ SOLN
10.0000 mg | INTRAVENOUS | Status: DC | PRN
  Administered 2013-06-25: 50 ug/min via INTRAVENOUS

## 2013-06-25 MED ORDER — DEXAMETHASONE SODIUM PHOSPHATE 4 MG/ML IJ SOLN
INTRAMUSCULAR | Status: DC | PRN
  Administered 2013-06-25: 4 mg via INTRAVENOUS

## 2013-06-25 MED ORDER — LEVOTHYROXINE SODIUM 175 MCG PO TABS
175.0000 ug | ORAL_TABLET | Freq: Every day | ORAL | Status: DC
  Administered 2013-06-26 – 2013-07-02 (×7): 175 ug via ORAL
  Filled 2013-06-25 (×11): qty 1

## 2013-06-25 MED ORDER — DIVALPROEX SODIUM ER 500 MG PO TB24
500.0000 mg | ORAL_TABLET | Freq: Every day | ORAL | Status: DC
  Administered 2013-06-25 – 2013-07-02 (×7): 500 mg via ORAL
  Filled 2013-06-25 (×8): qty 1

## 2013-06-25 MED ORDER — ZOLPIDEM TARTRATE 5 MG PO TABS
5.0000 mg | ORAL_TABLET | Freq: Every evening | ORAL | Status: DC | PRN
  Administered 2013-06-26: 5 mg via ORAL
  Filled 2013-06-25: qty 1

## 2013-06-25 MED ORDER — ARTIFICIAL TEARS OP OINT
TOPICAL_OINTMENT | OPHTHALMIC | Status: DC | PRN
  Administered 2013-06-25: 1 via OPHTHALMIC

## 2013-06-25 MED ORDER — DIVALPROEX SODIUM 500 MG PO DR TAB: 500.0000 mg | DELAYED_RELEASE_TABLET | Freq: Two times a day (BID) | ORAL | Status: DC

## 2013-06-25 MED ORDER — SODIUM CHLORIDE 0.9 % IV SOLN: 250.0000 mL | INTRAVENOUS | Status: DC

## 2013-06-25 MED ORDER — LACTATED RINGERS IV SOLN
INTRAVENOUS | Status: DC | PRN
  Administered 2013-06-25 (×3): via INTRAVENOUS

## 2013-06-25 MED ORDER — ACETAMINOPHEN 650 MG RE SUPP: 650.0000 mg | RECTAL | Status: DC | PRN

## 2013-06-25 MED ORDER — 0.9 % SODIUM CHLORIDE (POUR BTL) OPTIME
TOPICAL | Status: DC | PRN
  Administered 2013-06-25: 1000 mL

## 2013-06-25 MED ORDER — ALBUMIN HUMAN 5 % IV SOLN
INTRAVENOUS | Status: DC | PRN
  Administered 2013-06-25 (×2): via INTRAVENOUS

## 2013-06-25 MED ORDER — LIDOCAINE HCL (CARDIAC) 20 MG/ML IV SOLN
INTRAVENOUS | Status: DC | PRN
  Administered 2013-06-25: 100 mg via INTRAVENOUS

## 2013-06-25 MED ORDER — GLYCOPYRROLATE 0.2 MG/ML IJ SOLN
INTRAMUSCULAR | Status: DC | PRN
  Administered 2013-06-25: 0.6 mg via INTRAVENOUS

## 2013-06-25 MED ORDER — FENTANYL CITRATE 0.05 MG/ML IJ SOLN: 25.0000 ug | INTRAMUSCULAR | Status: DC | PRN

## 2013-06-25 MED ORDER — OXYCODONE-ACETAMINOPHEN 5-325 MG PO TABS: 1.0000 | ORAL_TABLET | ORAL | Status: DC | PRN

## 2013-06-25 MED ORDER — ACETAMINOPHEN 325 MG PO TABS
650.0000 mg | ORAL_TABLET | ORAL | Status: DC | PRN
  Administered 2013-06-26 – 2013-06-30 (×10): 650 mg via ORAL
  Filled 2013-06-25 (×10): qty 2

## 2013-06-25 MED ORDER — PANTOPRAZOLE SODIUM 40 MG PO TBEC
40.0000 mg | DELAYED_RELEASE_TABLET | Freq: Every day | ORAL | Status: DC
  Administered 2013-06-26 – 2013-07-02 (×7): 40 mg via ORAL
  Filled 2013-06-25 (×7): qty 1

## 2013-06-25 MED ORDER — ALBUMIN HUMAN 5 % IV SOLN
INTRAVENOUS | Status: AC
  Filled 2013-06-25: qty 250

## 2013-06-25 MED ORDER — SODIUM CHLORIDE 0.9 % IJ SOLN: 9.0000 mL | INTRAMUSCULAR | Status: DC | PRN

## 2013-06-25 MED ORDER — ROCURONIUM BROMIDE 100 MG/10ML IV SOLN
INTRAVENOUS | Status: DC | PRN
  Administered 2013-06-25: 50 mg via INTRAVENOUS

## 2013-06-25 MED ORDER — VENLAFAXINE HCL ER 37.5 MG PO CP24
37.5000 mg | ORAL_CAPSULE | Freq: Once | ORAL | Status: DC
  Filled 2013-06-25: qty 1

## 2013-06-25 MED ORDER — DIPHENHYDRAMINE HCL 12.5 MG/5ML PO ELIX: 12.5000 mg | ORAL_SOLUTION | Freq: Four times a day (QID) | ORAL | Status: DC | PRN

## 2013-06-25 MED ORDER — ONDANSETRON HCL 4 MG/2ML IJ SOLN: 4.0000 mg | INTRAMUSCULAR | Status: DC | PRN

## 2013-06-25 MED ORDER — PROPOFOL 10 MG/ML IV BOLUS
INTRAVENOUS | Status: DC | PRN
  Administered 2013-06-25: 200 mg via INTRAVENOUS

## 2013-06-25 MED ORDER — SODIUM CHLORIDE 0.9 % IJ SOLN
3.0000 mL | Freq: Two times a day (BID) | INTRAMUSCULAR | Status: DC
  Administered 2013-06-27 – 2013-06-28 (×2): 3 mL via INTRAVENOUS
  Administered 2013-06-28: 10:00:00 via INTRAVENOUS
  Administered 2013-06-29 – 2013-07-01 (×5): 3 mL via INTRAVENOUS

## 2013-06-25 MED ORDER — PHENYLEPHRINE HCL 10 MG/ML IJ SOLN
INTRAMUSCULAR | Status: DC | PRN
  Administered 2013-06-25 (×10): 40 ug via INTRAVENOUS

## 2013-06-25 MED ORDER — SODIUM CHLORIDE 0.9 % IV SOLN
INTRAVENOUS | Status: DC
  Administered 2013-06-25 – 2013-06-26 (×2): via INTRAVENOUS

## 2013-06-25 MED ORDER — DIPHENHYDRAMINE HCL 50 MG/ML IJ SOLN: 12.5000 mg | Freq: Four times a day (QID) | INTRAMUSCULAR | Status: DC | PRN

## 2013-06-25 MED ORDER — ONDANSETRON HCL 4 MG/2ML IJ SOLN
INTRAMUSCULAR | Status: DC | PRN
  Administered 2013-06-25: 4 mg via INTRAVENOUS

## 2013-06-25 MED ORDER — ONDANSETRON HCL 4 MG/2ML IJ SOLN: 4.0000 mg | Freq: Four times a day (QID) | INTRAMUSCULAR | Status: DC | PRN

## 2013-06-25 SURGICAL SUPPLY — 86 items
APL SKNCLS STERI-STRIP NONHPOA (GAUZE/BANDAGES/DRESSINGS) ×1
APL SRG 60D 8 XTD TIP BNDBL (TIP) ×1
BENZOIN TINCTURE PRP APPL 2/3 (GAUZE/BANDAGES/DRESSINGS) ×2 IMPLANT
BLADE SURG ROTATE 9660 (MISCELLANEOUS) IMPLANT
BUR ACORN 6.0 (BURR) ×2 IMPLANT
BUR MATCHSTICK NEURO 3.0 LAGG (BURR) ×2 IMPLANT
CANISTER SUCTION 2500CC (MISCELLANEOUS) ×2 IMPLANT
CAP REVERE LOCKING (Cap) ×6 IMPLANT
CLOTH BEACON ORANGE TIMEOUT ST (SAFETY) ×2 IMPLANT
CONN CROSSLINK REV 6.35 48-60 (Connector) ×2 IMPLANT
CONNECTOR CRSLNK REV6.35 48-60 (Connector) IMPLANT
CONT SPEC 4OZ CLIKSEAL STRL BL (MISCELLANEOUS) ×3 IMPLANT
COVER BACK TABLE 24X17X13 BIG (DRAPES) IMPLANT
COVER TABLE BACK 60X90 (DRAPES) ×2 IMPLANT
DRAPE C-ARM 42X72 X-RAY (DRAPES) ×4 IMPLANT
DRAPE LAPAROTOMY 100X72X124 (DRAPES) ×2 IMPLANT
DRAPE POUCH INSTRU U-SHP 10X18 (DRAPES) ×2 IMPLANT
DRSG PAD ABDOMINAL 8X10 ST (GAUZE/BANDAGES/DRESSINGS) ×1 IMPLANT
DURAPREP 26ML APPLICATOR (WOUND CARE) ×2 IMPLANT
DURASEAL APPLICATOR TIP (TIP) ×1 IMPLANT
DURASEAL SPINE SEALANT 3ML (MISCELLANEOUS) ×1 IMPLANT
ELECT BLADE 4.0 EZ CLEAN MEGAD (MISCELLANEOUS) ×2
ELECT REM PT RETURN 9FT ADLT (ELECTROSURGICAL) ×2
ELECTRODE BLDE 4.0 EZ CLN MEGD (MISCELLANEOUS) IMPLANT
ELECTRODE REM PT RTRN 9FT ADLT (ELECTROSURGICAL) ×1 IMPLANT
EVACUATOR 1/8 PVC DRAIN (DRAIN) IMPLANT
EVACUATOR 3/16  PVC DRAIN (DRAIN) ×1
EVACUATOR 3/16 PVC DRAIN (DRAIN) IMPLANT
GAUZE SPONGE 4X4 16PLY XRAY LF (GAUZE/BANDAGES/DRESSINGS) ×3 IMPLANT
GLOVE BIO SURGEON STRL SZ8.5 (GLOVE) ×1 IMPLANT
GLOVE BIOGEL M 8.0 STRL (GLOVE) ×4 IMPLANT
GLOVE BIOGEL PI IND STRL 7.0 (GLOVE) IMPLANT
GLOVE BIOGEL PI IND STRL 8 (GLOVE) IMPLANT
GLOVE BIOGEL PI INDICATOR 7.0 (GLOVE) ×1
GLOVE BIOGEL PI INDICATOR 8 (GLOVE) ×4
GLOVE ECLIPSE 7.5 STRL STRAW (GLOVE) ×3 IMPLANT
GLOVE ECLIPSE 8.0 STRL XLNG CF (GLOVE) ×1 IMPLANT
GLOVE EXAM NITRILE LRG STRL (GLOVE) IMPLANT
GLOVE EXAM NITRILE MD LF STRL (GLOVE) ×1 IMPLANT
GLOVE EXAM NITRILE XL STR (GLOVE) IMPLANT
GLOVE EXAM NITRILE XS STR PU (GLOVE) IMPLANT
GLOVE SS BIOGEL STRL SZ 8 (GLOVE) IMPLANT
GLOVE SUPERSENSE BIOGEL SZ 8 (GLOVE) ×1
GLOVE SURG SS PI 7.0 STRL IVOR (GLOVE) ×1 IMPLANT
GOWN BRE IMP SLV AUR LG STRL (GOWN DISPOSABLE) ×1 IMPLANT
GOWN BRE IMP SLV AUR XL STRL (GOWN DISPOSABLE) ×5 IMPLANT
GOWN STRL REIN 2XL LVL4 (GOWN DISPOSABLE) ×3 IMPLANT
KIT BASIN OR (CUSTOM PROCEDURE TRAY) ×2 IMPLANT
KIT INFUSE MEDIUM (Orthopedic Implant) ×1 IMPLANT
KIT ROOM TURNOVER OR (KITS) ×2 IMPLANT
MILL MEDIUM DISP (BLADE) ×1 IMPLANT
NDL HYPO 18GX1.5 BLUNT FILL (NEEDLE) IMPLANT
NDL HYPO 21X1.5 SAFETY (NEEDLE) IMPLANT
NDL HYPO 25X1 1.5 SAFETY (NEEDLE) IMPLANT
NEEDLE HYPO 18GX1.5 BLUNT FILL (NEEDLE) IMPLANT
NEEDLE HYPO 21X1.5 SAFETY (NEEDLE) ×2 IMPLANT
NEEDLE HYPO 25X1 1.5 SAFETY (NEEDLE) IMPLANT
NS IRRIG 1000ML POUR BTL (IV SOLUTION) ×2 IMPLANT
PACK LAMINECTOMY NEURO (CUSTOM PROCEDURE TRAY) ×2 IMPLANT
PAD ARMBOARD 7.5X6 YLW CONV (MISCELLANEOUS) ×7 IMPLANT
PATTIES SURGICAL .5 X1 (DISPOSABLE) ×1 IMPLANT
PATTIES SURGICAL .5 X3 (DISPOSABLE) IMPLANT
PATTIES SURGICAL 1X1 (DISPOSABLE) ×1 IMPLANT
ROD REVERE 6.35 CURVED 70MM (Rod) ×1 IMPLANT
ROD REVERE 6.35 CURVED 80MM (Rod) ×1 IMPLANT
SCREW 5.5X55 (Screw) ×2 IMPLANT
SCREW REVERE 5.5X45 (Screw) ×4 IMPLANT
SPACER SUSTAIN O SML 8X22 12MM (Spacer) ×2 IMPLANT
SPACER SUSTAIN O SML 8X22 15MM (Spacer) ×4 IMPLANT
SPONGE GAUZE 4X4 12PLY (GAUZE/BANDAGES/DRESSINGS) ×2 IMPLANT
SPONGE LAP 4X18 X RAY DECT (DISPOSABLE) IMPLANT
SPONGE NEURO XRAY DETECT 1X3 (DISPOSABLE) IMPLANT
SPONGE SURGIFOAM ABS GEL 100 (HEMOSTASIS) ×2 IMPLANT
STRIP CLOSURE SKIN 1/2X4 (GAUZE/BANDAGES/DRESSINGS) ×2 IMPLANT
SUT VIC AB 1 CT1 18XBRD ANBCTR (SUTURE) ×2 IMPLANT
SUT VIC AB 1 CT1 8-18 (SUTURE) ×4
SUT VIC AB 2-0 CP2 18 (SUTURE) ×2 IMPLANT
SUT VIC AB 3-0 SH 8-18 (SUTURE) ×2 IMPLANT
SYR 20CC LL (SYRINGE) IMPLANT
SYR 20ML ECCENTRIC (SYRINGE) ×2 IMPLANT
SYR 5ML LL (SYRINGE) IMPLANT
TAPE CLOTH SURG 4X10 WHT LF (GAUZE/BANDAGES/DRESSINGS) ×1 IMPLANT
TOWEL OR 17X24 6PK STRL BLUE (TOWEL DISPOSABLE) ×2 IMPLANT
TOWEL OR 17X26 10 PK STRL BLUE (TOWEL DISPOSABLE) ×2 IMPLANT
TRAY FOLEY CATH 14FRSI W/METER (CATHETERS) ×2 IMPLANT
WATER STERILE IRR 1000ML POUR (IV SOLUTION) ×2 IMPLANT

## 2013-06-25 NOTE — Progress Notes (Signed)
Doing well. Blood pressure down to 80.got volume. To get a cbc. Family seen her in the PACU

## 2013-06-25 NOTE — Anesthesia Postprocedure Evaluation (Signed)
  Anesthesia Post-op Note  Patient: Crystal Larsen  Procedure(s) Performed: Procedure(s) with comments: Lumbar four-five, [name] (N/A) - POSTERIOR LUMBAR FUSION 1 LEVEL  Patient Location: PACU  Anesthesia Type:General  Level of Consciousness: awake  Airway and Oxygen Therapy: Patient Spontanous Breathing  Post-op Pain: mild  Post-op Assessment: Post-op Vital signs reviewed  Post-op Vital Signs: Reviewed  Complications: No apparent anesthesia complications

## 2013-06-25 NOTE — Progress Notes (Signed)
Pt. Given 500 cc bolus of LR prior to transport , l liter and 2 albumins previously

## 2013-06-25 NOTE — Progress Notes (Signed)
Dr Jeral Fruit and Dr Randa Evens , aware of lab results and BP, oked to take to room after fluids

## 2013-06-25 NOTE — Transfer of Care (Signed)
Immediate Anesthesia Transfer of Care Note  Patient: Crystal Larsen  Procedure(s) Performed: Procedure(s) with comments: Lumbar four-five, [name] (N/A) - POSTERIOR LUMBAR FUSION 1 LEVEL  Patient Location: PACU  Anesthesia Type:General  Level of Consciousness: responds to stimulation  Airway & Oxygen Therapy: Patient Spontanous Breathing and Patient connected to nasal cannula oxygen  Post-op Assessment: Report given to PACU RN and Post -op Vital signs reviewed and stable  Post vital signs: Reviewed and stable  Complications: No apparent anesthesia complications

## 2013-06-25 NOTE — Plan of Care (Signed)
Problem: Consults Goal: Diagnosis - Spinal Surgery Thoraco/Lumbar Spine Fusion     

## 2013-06-25 NOTE — Progress Notes (Signed)
Op note [id number]

## 2013-06-25 NOTE — Anesthesia Preprocedure Evaluation (Addendum)
Anesthesia Evaluation  Patient identified by MRN, date of birth, ID band Patient awake    History of Anesthesia Complications (+) PONV  Airway Mallampati: II TM Distance: <3 FB Neck ROM: Full    Dental  (+) Teeth Intact and Dental Advisory Given   Pulmonary sleep apnea ,  breath sounds clear to auscultation        Cardiovascular hypertension, Rhythm:Regular Rate:Normal     Neuro/Psych    GI/Hepatic Neg liver ROS, GERD-  Medicated and Controlled,  Endo/Other  diabetes  Renal/GU negative Renal ROS     Musculoskeletal   Abdominal   Peds  Hematology   Anesthesia Other Findings   Reproductive/Obstetrics                         Anesthesia Physical Anesthesia Plan  ASA: III  Anesthesia Plan: General   Post-op Pain Management:    Induction: Intravenous  Airway Management Planned: Oral ETT  Additional Equipment:   Intra-op Plan:   Post-operative Plan:   Informed Consent: I have reviewed the patients History and Physical, chart, labs and discussed the procedure including the risks, benefits and alternatives for the proposed anesthesia with the patient or authorized representative who has indicated his/her understanding and acceptance.   Dental advisory given  Plan Discussed with: CRNA, Anesthesiologist and Surgeon  Anesthesia Plan Comments:        Anesthesia Quick Evaluation

## 2013-06-25 NOTE — Anesthesia Procedure Notes (Signed)
Procedure Name: Intubation Date/Time: 06/25/2013 8:43 AM Performed by: Luster Landsberg Pre-anesthesia Checklist: Patient identified, Emergency Drugs available, Suction available and Patient being monitored Patient Re-evaluated:Patient Re-evaluated prior to inductionOxygen Delivery Method: Circle system utilized Preoxygenation: Pre-oxygenation with 100% oxygen Intubation Type: IV induction Ventilation: Mask ventilation with difficulty and Oral airway inserted - appropriate to patient size Grade View: Grade II Tube size: 7.0 mm Number of attempts: 2 Airway Equipment and Method: Stylet and Video-laryngoscopy Placement Confirmation: ETT inserted through vocal cords under direct vision,  positive ETCO2 and breath sounds checked- equal and bilateral Secured at: 22 cm Tube secured with: Tape Dental Injury: Teeth and Oropharynx as per pre-operative assessment  Comments: Glidescope used r/t decreased TM distance and prominent front teeth.  Pt with good neck extension.  DVL x1 by CRNA with glidescope - good view of cords, unable to pass ETT. DVL x1 by MDA good view and ETT passed thru cords.  +/= BS +ETCO2

## 2013-06-25 NOTE — H&P (Signed)
Crystal Larsen is an 57 y.o. female.   Chief Complaint: lbp HPI: patient with a 3 years history of lbp with radiation to the right leg, no better with conservative treatment. Sometime while she walking her leg freeze and she can not walk  Past Medical History  Diagnosis Date  . GERD (gastroesophageal reflux disease)   . Hyperthyroidism   . Anxiety and depression   . Arthritis   . Gallstones   . HTN (hypertension)   . Hypothyroidism   . IBS (irritable bowel syndrome)     ?  . Obesity   . Depression   . PONV (postoperative nausea and vomiting) 81    only time  . Sleep apnea     does not use cpap  . Seizures 03     due to rx  . Chronic headaches     Past Surgical History  Procedure Laterality Date  . Cholecystectomy    . Nasal sinus surgery    . Ganglian cyst right ankle    . Cesarean section    . Bunionectomy      left  . Rotator cuff repair Right     right    Family History  Problem Relation Age of Onset  . Heart disease Maternal Grandfather     PGF  . Alcohol abuse Maternal Grandfather   . Hyperlipidemia Maternal Grandfather   . Anxiety disorder Sister   . Asthma Mother   . Lung cancer Mother   . Fibromyalgia Sister   . Nephrolithiasis Daughter   . Heart disease Father    Social History:  reports that she has never smoked. She has never used smokeless tobacco. She reports that she drinks about 1.2 ounces of alcohol per week. She reports that she does not use illicit drugs.  Allergies:  Allergies  Allergen Reactions  . Erythromycin     REACTION: rash  . Sulfonamide Derivatives     REACTION: rash  . Tramadol     seizures  . Adhesive (Tape) Rash    bandaide    Medications Prior to Admission  Medication Sig Dispense Refill  . aspirin 325 MG tablet Take 325 mg by mouth daily.      . celecoxib (CELEBREX) 200 MG capsule Take 200 mg by mouth daily.        [name] desvenlafaxine (PRISTIQ) 50 MG 24 hr tablet Take 50 mg by mouth daily.      . divalproex  (DEPAKOTE ER) 500 MG 24 hr tablet TAKE ONE TABLET BY MOUTH TWICE DAILY  180 tablet  1  . divalproex (DEPAKOTE) 500 MG DR tablet Take 500 mg by mouth 2 (two) times daily.        . Doxylamine Succinate, Sleep, (SLEEP AID PO) Take 1 tablet by mouth at bedtime.        [name] estradiol (MINIVELLE) 0.1 MG/24HR Place 1 patch onto the skin 2 (two) times a week.      . levothyroxine (SYNTHROID, LEVOTHROID) 175 MCG tablet Take 175 mcg by mouth daily before breakfast.      . lisinopril (PRINIVIL,ZESTRIL) 40 MG tablet Take 40 mg by mouth daily.        . Melatonin 10 MG TBCR Take 1 tablet by mouth at bedtime.        . Multiple Vitamin (MULTIVITAMIN) capsule Take 1 capsule by mouth daily.        . niacin (NIASPAN) 500 MG CR tablet Take 500 mg by mouth at bedtime.      [name]  Omega-3 Fatty Acids (FISH OIL) 1000 MG CAPS Take 3 capsules by mouth daily.        [name] oxyCODONE (OXYCONTIN) 10 MG 12 hr tablet Take 5-10 mg by mouth every 12 (twelve) hours.      . pantoprazole (PROTONIX) 40 MG tablet Take 40 mg by mouth daily.        . progesterone (PROMETRIUM) 100 MG capsule Take 100 mg by mouth daily.      [name] triamterene-hydrochlorothiazide (MAXZIDE-25) 37.5-25 MG per tablet Take 0.5 tablets by mouth daily.         No results found for this or any previous visit (from the past 48 hour(s)). No results found.  Review of Systems  HENT: Positive for neck pain.   Cardiovascular: Negative.   Gastrointestinal:       High cholesterol  Genitourinary: Negative.   Musculoskeletal: Positive for back pain.  Skin: Negative.   Neurological: Positive for sensory change and focal weakness.  Endo/Heme/Allergies: Negative.   Psychiatric/Behavioral: Negative.     Blood pressure 125/87, pulse 68, temperature 97 F (36.1 C), temperature source Oral, resp. rate 20, SpO2 99.00%. Physical Exam hent, nl. Neck, nl. Lung clear. Cv, nl. Abdomen, soft . Extremities, nl,,. NEURO weakness of both feet. Pain with walking in her heels. Mri shos severe  stenosis with the lumbar spine showing spondylolisthesis which gets worse with flexion.  Assessment/Plan Patient to go ahead with decompression and fusion at the l4-5. She is aware of risks and benefits  Casey Maxfield M 06/25/2013, 8:28 AM

## 2013-06-26 MED ORDER — ESTRADIOL 0.1 MG/24HR TD PTWK
0.1000 mg | MEDICATED_PATCH | TRANSDERMAL | Status: DC
  Filled 2013-06-26 (×2): qty 1

## 2013-06-26 MED ORDER — OXYCODONE HCL 5 MG PO TABS
15.0000 mg | ORAL_TABLET | ORAL | Status: DC | PRN
  Administered 2013-06-26 – 2013-06-28 (×11): 15 mg via ORAL
  Filled 2013-06-26 (×11): qty 3

## 2013-06-26 MED FILL — Sodium Chloride IV Soln 0.9%: INTRAVENOUS | Qty: 2000 | Status: AC

## 2013-06-26 MED FILL — Heparin Sodium (Porcine) Inj 1000 Unit/ML: INTRAMUSCULAR | Qty: 30 | Status: AC

## 2013-06-26 NOTE — Progress Notes (Signed)
Patient ID: Crystal Larsen, female   DOB: 10/10/1956, 57 y.o.   MRN: [id number] DOING WELL, NO PAIN IN LEGS. NO WEAKNESS. PT TO HELP

## 2013-06-26 NOTE — Progress Notes (Signed)
   CARE MANAGEMENT NOTE 06/26/2013  Patient:  Crystal Larsen, Crystal Larsen   Account Number:  [id number]  Date Initiated:  06/26/2013  Documentation initiated by:  Jiles Crocker  Subjective/Objective Assessment:   ADMITTED FOR SURGERY - Bilateral laminectomy     Action/Plan:   LIVES AT HOME WITH SPOUSE  CM FOLLOWING FOR DCP   Anticipated DC Date:  06/28/2013   Anticipated DC Plan:  HOME/SELF CARE      DC Planning Services  CM consult          Status of service:  In process, will continue to follow Medicare Important Message given?  NA - LOS <3 / Initial given by admissions (If response is "NO", the following Medicare IM given date fields will be blank)  Per UR Regulation:  Reviewed for med. necessity/level of care/duration of stay  Comments:  06/26/2013- B Antino Mayabb RN,BSN,MHA

## 2013-06-26 NOTE — Care Management Note (Unsigned)
    Page 1 of 1   06/26/2013     2:19:17 PM   CARE MANAGEMENT NOTE 06/26/2013  Patient:  Crystal Larsen, Crystal Larsen   Account Number:  [id number]  Date Initiated:  06/26/2013  Documentation initiated by:  Jiles Crocker  Subjective/Objective Assessment:   ADMITTED FOR SURGERY - Bilateral laminectomy     Action/Plan:   LIVES AT HOME WITH SPOUSE  CM FOLLOWING FOR DCP   Anticipated DC Date:  06/28/2013   Anticipated DC Plan:  HOME/SELF CARE      DC Planning Services  CM consult      Choice offered to / List presented to:     DME arranged  3-N-1  Levan Hurst      DME agency  [facility]        Status of service:  Completed, signed off Medicare Important Message given?  NA - LOS <3 / Initial given by admissions (If response is "NO", the following Medicare IM given date fields will be blank) Date Medicare IM given:   Date Additional Medicare IM given:    Discharge Disposition:    Per UR Regulation:  Reviewed for med. necessity/level of care/duration of stay  If discussed at Long Length of Stay Meetings, dates discussed:    Comments:  06/26/13 1415 Elmer Bales RN, MSN, CM- Met with patient to discuss DME needs.  Rolling Walker and 3N1 were ordered per PT/OT recommendations.  Pt denies any additional needs.  06/26/2013- B CHANDLER RN,BSN,MHA

## 2013-06-26 NOTE — Evaluation (Signed)
Physical Therapy Evaluation Patient Details Name: Crystal Larsen MRN: [id number] DOB: 27-Feb-1956 Today's Date: 06/26/2013 Time: 7829-5621 PT Time Calculation (min): 47 min  PT Assessment / Plan / Recommendation History of Present Illness  [name], [name]   Clinical Impression  Patient is s/p above back surgery resulting in the deficits listed below (see PT Problem List). Patient will benefit from skilled PT to increase their independence and safety with mobility (while adhering to their precautions) to allow discharge home with support of family. Anticipate she will reach modified independent level soon. Likely no need for HHPT at this time. Discussed possibility of OPPT for core strengthening when cleared by surgeon.      PT Assessment  Patient needs continued PT services    Follow Up Recommendations  No PT follow up    Does the patient have the potential to tolerate intense rehabilitation      Barriers to Discharge        Equipment Recommendations  Rolling walker with 5" wheels    Recommendations for Other Services     Frequency Min 5X/week    Precautions / Restrictions Precautions Precautions: Back Restrictions Weight Bearing Restrictions: No   Pertinent Vitals/Pain Reports 5/10 back pain, RN provided pain meds prior to session     Mobility  Bed Mobility Bed Mobility: Rolling Left;Left Sidelying to Sit Rolling Left: 4: Min guard Left Sidelying to Sit: 4: Min guard;HOB flat Details for Bed Mobility Assistance: sequencing cues for safe technique Transfers Transfers: Stand to Sit;Sit to Stand Sit to Stand: 4: Min assist;4: Min guard;With upper extremity assist Stand to Sit: 4: Min guard;With upper extremity assist Details for Transfer Assistance: initially needing min lift off assist for stability and follow through, performed sit<>stand another two times needing only  verbal cues for technique Ambulation/Gait Ambulation/Gait Assistance: 4: Min guard;5: Supervision Ambulation Distance (Feet): 400 Feet Assistive device: Rolling walker Ambulation/Gait Assistance Details: initially needing mingaurdA as she was moving slowly and for stability, as she felt more comfortable she progressed to supervision using RW Gait Pattern: Step-through pattern;Decreased trunk rotation;Decreased stride length Gait velocity: decreased, slow and gaurded Stairs: No    Exercises     PT Diagnosis: Difficulty walking;Acute pain  PT Problem List: Decreased activity tolerance;Decreased mobility;Decreased knowledge of precautions;Decreased knowledge of use of DME PT Treatment Interventions: DME instruction;Gait training;Stair training;Functional mobility training;Therapeutic activities;Therapeutic exercise;Patient/family education     PT Goals(Current goals can be found in the care plan section) Acute Rehab PT Goals Patient Stated Goal: back to walking and to the gym PT Goal Formulation: With patient Time For Goal Achievement: 07/03/13 Potential to Achieve Goals: Good  Visit Information  Last PT Received On: 06/26/13 Assistance Needed: +1 History of Present Illness: [name], [name]        Prior Functioning  Home Living Family/patient expects to be discharged to:: Private residence Living Arrangements: Spouse/significant other;Children Available Help at Discharge: Available 24 hours/day Type of Home: House Home Access: Stairs to enter Secretary/administrator of Steps: 2 Entrance Stairs-Rails: None Home Layout: One level Home Equipment: None Prior Function Level of Independence: Independent Comments: only could stand for 3 minutes before needing to sit down Communication Communication: No difficulties    Cognition  Cognition Arousal/Alertness: Awake/alert Behavior  During Therapy: WFL for tasks assessed/performed Overall Cognitive Status: Within Functional Limits for tasks assessed  Extremity/Trunk Assessment Upper Extremity Assessment Upper Extremity Assessment: Defer to OT evaluation Lower Extremity Assessment Lower Extremity Assessment: Overall WFL for tasks assessed   Balance    End of Session PT - End of Session Equipment Utilized During Treatment: Gait belt Activity Tolerance: Patient tolerated treatment well Patient left: in chair;with call bell/phone within reach Nurse Communication: Mobility status  GP     Ludger Nutting 06/26/2013, 10:35 AM

## 2013-06-26 NOTE — Evaluation (Signed)
Occupational Therapy Evaluation Patient Details Name: Crystal Larsen MRN: [id number] DOB: 1956/02/28 Today's Date: 06/26/2013 Time: 4098-1191 OT Time Calculation (min): 24 min  OT Assessment / Plan / Recommendation History of present illness [name], [name]    Clinical Impression   Patient is s/p lumbar fusion surgery resulting in the deficits listed below (see OT Problem List). Pt is very motivated with very good family support, anticipate good progress Patient will benefit from skilled OT to increase their safety and independence with ADL and functional mobility for ADL (while adhering to their precautions) to facilitate discharge to venue listed below.      OT Assessment  Patient needs continued OT Services    Follow Up Recommendations  No OT follow up;Supervision - Intermittent    Barriers to Discharge      Equipment Recommendations  3 in 1 bedside comode    Recommendations for Other Services    Frequency  Min 2X/week    Precautions / Restrictions Precautions Precautions: Back Restrictions Weight Bearing Restrictions: No   Pertinent Vitals/Pain     ADL  Eating/Feeding: Independent Where Assessed - Eating/Feeding: Chair Grooming: Wash/dry hands;Wash/dry face;Brushing hair;Supervision/safety Where Assessed - Grooming: Supported sitting Upper Body Bathing: Supervision/safety Where Assessed - Upper Body Bathing: Supported sitting Lower Body Bathing: Moderate assistance Where Assessed - Lower Body Bathing: Supported sit to stand Upper Body Dressing: Minimal assistance Where Assessed - Upper Body Dressing: Unsupported sitting Lower Body Dressing: Maximal assistance Where Assessed - Lower Body Dressing: Supported sit to stand Toilet Transfer: Hydrographic surveyor Method: Sit to stand;Stand pivot Acupuncturist: Raised toilet seat with arms (or 3-in-1 over  toilet) Toileting - Clothing Manipulation and Hygiene: Minimal assistance Where Assessed - Engineer, mining and Hygiene: Standing Equipment Used: Rolling walker Transfers/Ambulation Related to ADLs: min guard assist ADL Comments: Pt able to cross ankles over knees, but unable to doff socks due to pain with sitting.  Pt requires min verbal cues for back precautions.  Family very supportive    OT Diagnosis: Generalized weakness;Acute pain  OT Problem List: Decreased strength;Decreased activity tolerance;Decreased knowledge of use of DME or AE;Decreased knowledge of precautions;Pain OT Treatment Interventions: Self-care/ADL training;DME and/or AE instruction;Therapeutic activities;Patient/family education   OT Goals(Current goals can be found in the care plan section) Acute Rehab OT Goals Patient Stated Goal: back to walking and to the gym OT Goal Formulation: With patient Time For Goal Achievement: 07/03/13 Potential to Achieve Goals: Good ADL Goals Pt Will Perform Grooming: with supervision;standing Pt Will Perform Lower Body Bathing: with supervision;sit to/from stand Pt Will Perform Lower Body Dressing: with supervision;sit to/from stand Pt Will Transfer to Toilet: with supervision;ambulating;regular height toilet;bedside commode Pt Will Perform Toileting - Clothing Manipulation and hygiene: with supervision;sit to/from stand Pt Will Perform Tub/Shower Transfer: Shower transfer;with supervision;ambulating Additional ADL Goal #1: Pt will be independent with BADLs during all functional activities  Visit Information  Last OT Received On: 06/26/13 Assistance Needed: +1 History of Present Illness: [name], [name]        Prior Functioning     Home Living Family/patient expects to be discharged to:: Private residence Living Arrangements: Spouse/significant  other;Children Available Help at Discharge: Available 24 hours/day Type of Home: House Home Access: Stairs to enter [employer] of Steps: 2 Entrance Stairs-Rails: None Home Layout: One level Home Equipment: None Prior  Function Level of Independence: Independent Comments: only could stand for 3 minutes before needing to sit down Communication Communication: No difficulties         Vision/Perception     Cognition  Cognition Arousal/Alertness: Awake/alert Behavior During Therapy: WFL for tasks assessed/performed Overall Cognitive Status: Within Functional Limits for tasks assessed    Extremity/Trunk Assessment Upper Extremity Assessment Upper Extremity Assessment: Overall WFL for tasks assessed Lower Extremity Assessment Lower Extremity Assessment: Overall WFL for tasks assessed     Mobility Bed Mobility Bed Mobility: Sit to Sidelying Left Rolling Left: 4: Min guard Left Sidelying to Sit: 4: Min guard;HOB flat Sit to Sidelying Left: 3: Mod assist;HOB flat Details for Bed Mobility Assistance: Verbal cues for technique, back precautions and assist to lift LEs onto bed Transfers Transfers: Sit to Stand;Stand to Sit Sit to Stand: 4: Min guard;With upper extremity assist;From chair/3-in-1;From bed Stand to Sit: 4: Min guard;With upper extremity assist;To bed Details for Transfer Assistance: initially needing min lift off assist for stability and follow through, performed sit<>stand another two times needing only verbal cues for technique     Exercise     Balance     End of Session OT - End of Session Equipment Utilized During Treatment: Rolling walker Activity Tolerance: Patient limited by pain Patient left: in bed;with call bell/phone within reach;with family/visitor present Nurse Communication: Mobility status  GO     Crystal Larsen M 06/26/2013, 11:55 AM

## 2013-06-26 NOTE — Op Note (Signed)
NAMEORALIA, CRIGER NO.:  [id number]  MEDICAL RECORD NO.:  [id number]  LOCATION:  [id number]                        FACILITY:  [facility]  PHYSICIAN:  Hilda Lias, M.D.   DATE OF BIRTH:  30-Mar-1956  DATE OF PROCEDURE: DATE OF DISCHARGE:                              OPERATIVE REPORT   PREOPERATIVE DIAGNOSIS:  Lumbar stenosis L4-5 with spondylolisthesis and chronic radiculopathy.  POSTOPERATIVE DIAGNOSIS:  Lumbar stenosis with spondylolisthesis L4-L5, L5-S1 chronic radiculopathy.  PROCEDURE:  Bilateral laminectomy at L4-L5, facetectomy of l4 and l5 __________, diskectomy L4-L5, L5-S1 more than normal  discectomy  __________ to be able to introduce the cages, pedicle screws L4-L5, and L5-S1, posterolateral arthrodesis with autograft and BMP L4-L5 and L5-S1.  cell saver, c-arm__________.  SURGEON:  Hilda Lias, M.D.  ASSISTANT:  _Dr Jenkins_________  CLINICAL HISTORY:  The lady came to see me in my office complaining of back pain worsened to the right leg to the point then sometimes she was unable to move her right leg.  The pain had been going for many years. She had an MRI read by the neurologist who showed spondylolisthesis L4-5 and severe stenosis at the level.  The x-ray that we did in the office show spondylolisthesis at L4-5 and some step-off between L5-S1. Nevertheless, upon reviewing the MRI, and after I opened the spine, I had a telephone consult with the radiologist, Dr. Kemper Durie, which told me that indeed the L5-1 area was unstable.  This is something that we found during          the surgery when we pulled the spinal process of L5 and the area of the facets of l5s1 was  with quite a bit of degenerative and loose__________.  PROCEDURE:  The patient was taken to the OR, and after intubation, she was positioned on prone manner.  The back was cleaned with Betadine and later on with DuraPrep.  Drapes were applied.  Midline incision from L4 down to L5-S1  was made and retraction was done all the way laterally until we were able to feel the transverse process of L4-5 and the ALA of the sacrum.  Indeed when I probed the spinous process of L4, the area was quite loose.  I proceeded with removal of spinous process of L4, the lamina, and the facet.  The patient had a quite a bit of thickening of the yellow ligament.  Retraction of the thecal sac was made in the right side and later on the left side, and total gross diskectomy was achieved.  At this time, thus when I pulled the spinous process of L5 and found out that indeed that there was degenerative and quite loose. Thus, when the consult with the Radiology was made and from then, I proceeded with removal of spinous process of L5, the lamina, and the facet.  We entered the disk space of L5-S1 and total gross diskectomy was achieved bilaterally.  Then, first at the L4-5 to the left side, we introduced a cage of 15 x 26 with autograft and BMP inside.  The second cage was also introduced at the level of L4-5 and L5-S1.  Then we proceeded to the right side and 2  cages with the same size were introduced in the right side.  From then on using the C-arm first in the AP view and then a lateral view, I probed the pedicles of L4 and L5-S1. At the level of L4-L5, I inserted 2 screws of 5.5 x 45 and the level of S1 were introduced.  Two screws of 5.5 x 55.  The AP and lateral view showed good position of the screws.  Nevertheless, prior to inserting the screws, I was able to fill the holes to be sure that we were surrounded by bony lower quadrants.  Then rod from L4-S1 bilaterally was used and the caps to secure the rod in place.  Then cross-link from right to left was used.  I will laterally have to remove the periosteum of the lateral aspect of the facet of L4-5 and L5-S1, and a mix of BMP and autograft was used for arthrodesis.  The area was irrigated.  Valsalva maneuver was negative. There was an  arachnoih__________ pouch and we used a surgical glue into the area. Then, the area was irrigated.  Hemovac of 10 mm was inserted in the epidural space, and the wound was closed with several stitches of Vicryl and Steri-Strips.          ______________________________ Hilda Lias, M.D.     EB/MEDQ  D:  06/25/2013  T:  06/26/2013  Job:  [id number]

## 2013-06-27 MED ORDER — MANAGING BACK PAIN BOOK
Freq: Once | Status: AC
  Administered 2013-06-27: 06:00:00
  Filled 2013-06-27: qty 1

## 2013-06-27 MED ORDER — DIPHENHYDRAMINE HCL 25 MG PO CAPS: 50.0000 mg | ORAL_CAPSULE | ORAL | Status: DC | PRN

## 2013-06-27 NOTE — Progress Notes (Signed)
Pain radiating down both legs, hips to toes.  Dr Franky Macho notified, no new orders. Adm. Ice to lower back, all analgesics and musc. Relaxer with relief.

## 2013-06-27 NOTE — Progress Notes (Signed)
Patient ID: Crystal Larsen, female   DOB: 02/15/1956, 57 y.o.   MRN: [id number] C/o spasms, no weakness. Drain to be removed. Continue with pt

## 2013-06-27 NOTE — Progress Notes (Signed)
Occupational Therapy Treatment Patient Details Name: Crystal Larsen MRN: [id number] DOB: 07-23-1956 Today's Date: 06/27/2013 Time: 4098-1191 OT Time Calculation (min): 38 min  OT Assessment / Plan / Recommendation  History of present illness [name], [name]    OT comments  Pt currently at a supervision level for simulated selfcare tasks and functional transfers.  Pt will have PRN supervision from her husband at home.  She has been educated on AE use, DME use, and techniques needed to follow back precautions during ADLs.  No further acute OT needs.  Follow Up Recommendations  No OT follow up       Equipment Recommendations  3 in 1 bedside comode          Progress towards OT Goals Progress towards OT goals: Goals met/education completed, patient discharged from OT  Plan All goals met and education completed, patient discharged from OT services    Precautions / Restrictions Precautions Precautions: Back Precaution Comments: Pt able to recall 3/3 back precautions Restrictions Weight Bearing Restrictions: No   Pertinent Vitals/Pain Pain 2/10, meds given prior to session    ADL  Lower Body Dressing: Performed;Supervision/safety Where Assessed - Lower Body Dressing: Supported sit to Pharmacist, hospital Method: Other (comment) (simulated ambulating with the RW) Toilet Transfer Equipment: Bedside commode Toileting - Clothing Manipulation and Hygiene: Simulated;Supervision/safety Where Assessed - Toileting Clothing Manipulation and Hygiene: Sit to stand from 3-in-1 or toilet Tub/Shower Transfer: Simulated;Supervision/safety Tub/Shower Transfer Method: Science writer: Other (comment) (Pt plans to put 3:1 in shower.) Equipment Used: Rolling walker;Sock aid;Reacher Transfers/Ambulation Related to ADLs: Pt able to mobilize with close supervision. ADL Comments:  Pt able to cross LE over knee but feels pain/ stretch in her back.  Educated on AE for LB selfcare based on pt's current difficulty with reaching her feet.  Also discussed kitchen safety using the RW as well.       Visit Information  Last OT Received On: 06/27/13 Assistance Needed: +1 History of Present Illness: [name], [name]           Cognition  Cognition Arousal/Alertness: Awake/alert Behavior During Therapy: WFL for tasks assessed/performed Overall Cognitive Status: Within Functional Limits for tasks assessed    Mobility  Bed Mobility Bed Mobility: Rolling Left;Left Sidelying to Sit;Sit to Supine Rolling Left: 5: Supervision Left Sidelying to Sit: 5: Supervision Sitting - Scoot to Edge of Bed: 5: Supervision Sit to Supine: 5: Supervision Sit to Sidelying Left: 4: Min guard;HOB flat Details for Bed Mobility Assistance: Cues to reinforce back precautions & sequencing.   Transfers Transfers: Sit to Stand Sit to Stand: 5: Supervision;From bed;With upper extremity assist Stand to Sit: To bed;5: Supervision       Balance Balance Balance Assessed: Yes Static Standing Balance Static Standing - Balance Support: Right upper extremity supported;Left upper extremity supported Static Standing - Level of Assistance: 5: Stand by assistance   End of Session OT - End of Session Equipment Utilized During Treatment: Rolling walker Activity Tolerance: Patient tolerated treatment well Patient left: in bed;with call bell/phone within reach Nurse Communication: Mobility status     Ahnna Dungan OTR/L Pager number [phone] 06/27/2013, 2:17 PM

## 2013-06-27 NOTE — Progress Notes (Signed)
Physical Therapy Treatment Patient Details Name: Crystal Larsen MRN: [id number] DOB: 1956-02-06 Today's Date: 06/27/2013 Time: 1030-1059 PT Time Calculation (min): 29 min  PT Assessment / Plan / Recommendation  History of Present Illness [name], [name]    PT Comments   Pt very pleasant & willing to participate in PT session.  She reports better controlled at this time.  She moves well but slowly & guarded.  Pt's husband & daughter present entire session.  Pt & husband educated on & practiced steps today.     Follow Up Recommendations  No PT follow up     Does the patient have the potential to tolerate intense rehabilitation     Barriers to Discharge        Equipment Recommendations  Rolling walker with 5" wheels    Recommendations for Other Services    Frequency Min 5X/week   Progress towards PT Goals Progress towards PT goals: Progressing toward goals  Plan Current plan remains appropriate    Precautions / Restrictions Precautions Precautions: Back Precaution Comments: Pt able to recall 2/3 back precautions.  Reviewed all 3.  Occasional cues for "no twisting" with mobility Restrictions Weight Bearing Restrictions: No   Pertinent Vitals/Pain 2/10 back.      Mobility  Bed Mobility Bed Mobility: Left Sidelying to Sit;Sitting - Scoot to Edge of Bed;Sit to Sidelying Left Left Sidelying to Sit: HOB flat;4: Min guard Sitting - Scoot to Edge of Bed: 5: Supervision Sit to Sidelying Left: 4: Min guard;HOB flat Details for Bed Mobility Assistance: Cues to reinforce back precautions & sequencing.   Transfers Transfers: Sit to Stand;Stand to Sit Sit to Stand: 5: Supervision;With upper extremity assist;From bed;From chair/3-in-1 Stand to Sit: 5: Supervision;With upper extremity assist;With armrests;To chair/3-in-1;To bed Ambulation/Gait Ambulation/Gait Assistance: 5:  Supervision Ambulation Distance (Feet): 400 Feet Assistive device: Rolling walker Ambulation/Gait Assistance Details: Pt with slow gait speed but steady.  Pt does not appear to be relying much on RW at this time.   Gait Pattern: Step-through pattern;Decreased stride length Gait velocity: decreased Stairs: Yes Stairs Assistance: 4: Min assist Stairs Assistance Details (indicate cue type and reason): assist provided via HHA for stability & safety.  Pt's husband present & assisted with 2nd trial.   Stair Management Technique: Step to pattern;One rail Right;Forwards (HHA Lt side) Number of Stairs: 2 (2x's) Wheelchair Mobility Wheelchair Mobility: No      PT Goals (current goals can now be found in the care plan section) Acute Rehab PT Goals Patient Stated Goal: back to walking and to the gym PT Goal Formulation: With patient Time For Goal Achievement: 07/03/13 Potential to Achieve Goals: Good  Visit Information  Last PT Received On: 06/27/13 Assistance Needed: +1 History of Present Illness: [name], [name]     Subjective Data  Patient Stated Goal: back to walking and to the gym   Cognition  Cognition Arousal/Alertness: Awake/alert Behavior During Therapy: WFL for tasks assessed/performed Overall Cognitive Status: Within Functional Limits for tasks assessed    Balance     End of Session PT - End of Session Equipment Utilized During Treatment: Gait belt Activity Tolerance: Patient tolerated treatment well Patient left: in bed;with call bell/phone within reach;with family/visitor present Nurse Communication: Mobility status   GP     Lara Mulch 06/27/2013, 11:16 AM   Verdell Face, PTA [phone]  06/27/2013    

## 2013-06-28 MED ORDER — BACLOFEN 20 MG PO TABS
20.0000 mg | ORAL_TABLET | Freq: Three times a day (TID) | ORAL | Status: DC
  Administered 2013-06-28 – 2013-06-29 (×6): 20 mg via ORAL
  Filled 2013-06-28 (×9): qty 1

## 2013-06-28 MED ORDER — DEXAMETHASONE SODIUM PHOSPHATE 4 MG/ML IJ SOLN: 4.0000 mg | Freq: Four times a day (QID) | INTRAMUSCULAR | Status: DC

## 2013-06-28 MED ORDER — DIAZEPAM 5 MG PO TABS: 5.0000 mg | ORAL_TABLET | Freq: Four times a day (QID) | ORAL | Status: DC | PRN

## 2013-06-28 MED ORDER — DEXAMETHASONE SODIUM PHOSPHATE 4 MG/ML IJ SOLN
8.0000 mg | Freq: Once | INTRAMUSCULAR | Status: AC
  Administered 2013-06-28: 8 mg via INTRAVENOUS
  Filled 2013-06-28 (×2): qty 1

## 2013-06-28 NOTE — Progress Notes (Signed)
Patient ID: Crystal Larsen, female   DOB: Mar 28, 1956, 57 y.o.   MRN: [id number] Afeb.Vss No new neuro deficits. Complains of a lot of leg pain,c/w inflammation. Wound clean and dry. Will give a few doses of decadron, and make muscle relaxant available. See how she is feeling tomorrow.

## 2013-06-28 NOTE — Progress Notes (Signed)
Patient ID: Crystal Larsen, female   DOB: 1956/08/15, 57 y.o.   MRN: [id number] No leg pain or weakness but a lot of spasms specially at night.

## 2013-06-28 NOTE — Progress Notes (Signed)
Physical Therapy Treatment Patient Details Name: Crystal Larsen MRN: [id number] DOB: 1956/08/24 Today's Date: 06/28/2013 Time: 4098-1191 PT Time Calculation (min): 14 min  PT Assessment / Plan / Recommendation  History of Present Illness [name], [name]    PT Comments   Patient having increased spasms in buttocks reported since last night, limited ambulation this session. Planning on hopefully discharging tomorrow if pain can get under control  Follow Up Recommendations  No PT follow up     Does the patient have the potential to tolerate intense rehabilitation     Barriers to Discharge        Equipment Recommendations  Rolling walker with 5" wheels    Recommendations for Other Services    Frequency Min 5X/week   Progress towards PT Goals Progress towards PT goals: Progressing toward goals  Plan Current plan remains appropriate    Precautions / Restrictions Precautions Precautions: Back Precaution Comments: Pt able to recall 3/3 back precautions   Pertinent Vitals/Pain 8/10 buttocks spasms. RN provided medication to assist with pain control     Mobility  Bed Mobility Bed Mobility: Not assessed Transfers Sit to Stand: 5: Supervision;With upper extremity assist;From toilet Stand to Sit: 5: Supervision;To chair/3-in-1;With upper extremity assist Ambulation/Gait Ambulation/Gait Assistance: 5: Supervision Ambulation Distance (Feet): 150 Feet Assistive device: Rolling walker Ambulation/Gait Assistance Details: limited due to spasms Gait Pattern: Step-through pattern;Decreased stride length Gait velocity: decreased    Exercises     PT Diagnosis:    PT Problem List:   PT Treatment Interventions:     PT Goals (current goals can now be found in the care plan section)    Visit Information  Last PT Received On: 06/28/13 Assistance Needed: +1 History of Present Illness: 57  y/o female s/p Lumbar four-five, [name]     Subjective Data      Cognition  Cognition Arousal/Alertness: Awake/alert Behavior During Therapy: WFL for tasks assessed/performed Overall Cognitive Status: Within Functional Limits for tasks assessed    Balance     End of Session PT - End of Session Equipment Utilized During Treatment: Gait belt Activity Tolerance: Patient limited by pain Patient left: in chair;with call bell/phone within reach   GP     Fredrich Birks 06/28/2013, 1:28 PM 06/28/2013 Fredrich Birks PTA [phone] pager [phone] office

## 2013-06-28 NOTE — Progress Notes (Signed)
Pt ambulated in the hall with RN assist and walker, tolerated fairly well, slow but steady gait.

## 2013-06-29 MED ORDER — HYDROCODONE-ACETAMINOPHEN 5-325 MG PO TABS
1.0000 | ORAL_TABLET | ORAL | Status: DC | PRN
  Administered 2013-06-29 – 2013-06-30 (×2): 2 via ORAL
  Administered 2013-06-30: 1 via ORAL
  Administered 2013-07-01: 2 via ORAL
  Filled 2013-06-29: qty 2
  Filled 2013-06-29: qty 1
  Filled 2013-06-29 (×2): qty 2
  Filled 2013-06-29 (×2): qty 1

## 2013-06-29 MED ORDER — OXYCODONE-ACETAMINOPHEN 5-325 MG PO TABS
1.0000 | ORAL_TABLET | ORAL | Status: DC | PRN
  Administered 2013-06-29 – 2013-06-30 (×4): 1 via ORAL
  Administered 2013-06-30: 2 via ORAL
  Filled 2013-06-29 (×6): qty 1

## 2013-06-29 NOTE — Progress Notes (Signed)
Pt was  less alert this morning with delayed responses, although appropriate.  New orders over night for Vicodin in place of Oxy IR.  Pt becoming more alert as the morning progresses, will continue to monitor neuro status.

## 2013-06-29 NOTE — Progress Notes (Signed)
Patient ID: Crystal Larsen, female   DOB: 1956/02/21, 57 y.o.   MRN: [id number] Subjective:  the patient is mildly somnolent but easily arousable. By report at times she gets quite confused with pain medications, and at other times she has a lot of pain. I spoke with the patient's husband and daughter.  Objective: Vital signs in last 24 hours: Temp:  [98.1 F (36.7 C)-101 F (38.3 C)] 98.9 F (37.2 C) (08/03 1031) Pulse Rate:  [54-113] 103 (08/03 1031) Resp:  [16-20] 16 (08/03 1031) BP: (119-143)/(59-78) 127/76 mmHg (08/03 1031) SpO2:  [97 %-100 %] 99 % (08/03 1031)  Intake/Output from previous day: 08/02 0701 - 08/03 0700 In: 443 [P.O.:440; I.V.:3] Out: -  Intake/Output this shift:    Physical exam the patient is alert and pleasant. She is moving her lower extremities well. Her dressing has some old blood staining.  Lab Results: No results found for this basename: WBC, HGB, HCT, PLT,  in the last 72 hours BMET No results found for this basename: NA, K, CL, CO2, GLUCOSE, BUN, CREATININE, CALCIUM,  in the last 72 hours  Studies/Results: No results found.  Assessment/Plan: Postop day #4: I will discontinue the patient's Roxicodone and scar Percocet for severe pain and Norco for moderate pain.   LOS: 4 days     Lamount Bankson D 06/29/2013, 11:20 AM

## 2013-06-29 NOTE — Progress Notes (Signed)
Resident woke up very confused alert oriented to name and place but not month or year. Did mention that she had pain in her legs but  Unable to rate pain.  Call to M.D DR Lovell Sheehan on call order to give Vicodin 1-2 tabs Q4hrs prn pain. Pts vital signs T 100.0 P 118 R 20 B/P 132/58 will continue to monitor.

## 2013-06-29 NOTE — Progress Notes (Signed)
Pt still disoriented and confused although minimal amounts of narcotics (2 Perc in the last 12 hours) have been administered.  MD notified, no new orders at this time.  Will pass along to pm RN to monitor neuro status closely.

## 2013-06-29 NOTE — Progress Notes (Signed)
Physical Therapy Treatment Patient Details Name: Crystal Larsen MRN: [id number] DOB: 1956-09-16 Today's Date: 06/29/2013 Time: 4098-1191 PT Time Calculation (min): 14 min  PT Assessment / Plan / Recommendation  History of Present Illness [name], [name]    PT Comments   Pt with mild confusion although answers appropriately she is slow to respond at times.  Pt also required increased assist with mobility today & more cues to reinforce adherence with back precautions.  MD contributes confusion to medications.     Follow Up Recommendations  No PT follow up     Does the patient have the potential to tolerate intense rehabilitation     Barriers to Discharge        Equipment Recommendations  Rolling walker with 5" wheels    Recommendations for Other Services    Frequency Min 5X/week   Progress towards PT Goals Progress towards PT goals: Progressing toward goals  Plan Current plan remains appropriate    Precautions / Restrictions Precautions Precautions: Back Precaution Comments: Pt able to recall 3/3 back precautions but requires cues for "no twisting" with activity Restrictions Weight Bearing Restrictions: No   Pertinent Vitals/Pain Pain in buttocks, & LE's.  Pt never rated.      Mobility  Bed Mobility Bed Mobility: Not assessed Transfers Transfers: Sit to Stand;Stand to Sit Sit to Stand: 4: Min assist;With upper extremity assist;With armrests;From chair/3-in-1 Stand to Sit: 4: Min assist;With upper extremity assist;With armrests;To chair/3-in-1 Details for Transfer Assistance: cues for hand placement & technique.   Ambulation/Gait Ambulation/Gait Assistance: 4: Min guard Ambulation Distance (Feet): 200 Feet Assistive device: Rolling walker Ambulation/Gait Assistance Details: cues for "no twisting", keep hands on RW.   Last 5' of ambulation LE's began to give out & required  recliner to be brought up behind her for her to sit.   Gait Pattern: Step-through pattern;Decreased stride length Gait velocity: decreased General Gait Details: encouraged increased gait speed & increased stride length.   Stairs: No Wheelchair Mobility Wheelchair Mobility: No        PT Goals (current goals can now be found in the care plan section) Acute Rehab PT Goals PT Goal Formulation: With patient Time For Goal Achievement: 07/03/13 Potential to Achieve Goals: Good  Visit Information  Last PT Received On: 06/29/13 Assistance Needed: +1 History of Present Illness: [name], [name]     Subjective Data      Cognition  Cognition Arousal/Alertness: Awake/alert Behavior During Therapy: WFL for tasks assessed/performed Overall Cognitive Status: Impaired/Different from baseline (2/2 medications per MD)    Balance     End of Session PT - End of Session Equipment Utilized During Treatment: Gait belt Activity Tolerance: Patient tolerated treatment well Patient left: in chair;with call bell/phone within reach;with family/visitor present Nurse Communication: Mobility status   GP     Lara Mulch 06/29/2013, 11:15 AM   Verdell Face, PTA [phone] 06/29/2013

## 2013-06-29 NOTE — Progress Notes (Signed)
Patient alert easy to arouse  Oriented x4 rates pain at a 0 on the scale of 1-10. Will continue to monitor.

## 2013-06-30 LAB — URINALYSIS, ROUTINE W REFLEX MICROSCOPIC
Glucose, UA: NEGATIVE mg/dL
Ketones, ur: NEGATIVE mg/dL
Leukocytes, UA: NEGATIVE
Specific Gravity, Urine: 1.006 (ref 1.005–1.030)
pH: 7.5 (ref 5.0–8.0)

## 2013-06-30 LAB — URINE MICROSCOPIC-ADD ON

## 2013-06-30 MED ORDER — POLYETHYLENE GLYCOL 3350 17 G PO PACK
17.0000 g | PACK | Freq: Every day | ORAL | Status: DC
  Administered 2013-06-30 – 2013-07-02 (×3): 17 g via ORAL
  Filled 2013-06-30 (×3): qty 1

## 2013-06-30 NOTE — Progress Notes (Signed)
Pt more alert throughout the afternoon. Still with intermittent confusion but seems to be starting to get back to her baseline. Is now A/Ox3 (except time). Walked pt around the entire unit with min assist just needed cueing to remember to hold onto walker for balance and safety purposes. Has not had but 1 vicodin since 9am and only complains of occassional spasms in her R leg.

## 2013-06-30 NOTE — Progress Notes (Signed)
Patient is refusing IV re-stick attempt at this time. She verbalized understanding of in case of an emergency that we will place an IV because she is a full code.

## 2013-06-30 NOTE — Progress Notes (Signed)
Physical Therapy Treatment Patient Details Name: Crystal Larsen MRN: [id number] DOB: [birth year] Today's Date: 06/30/2013 Time: 3086-5784 PT Time Calculation (min): 32 min  PT Assessment / Plan / Recommendation  History of Present Illness [name], [name].  Pt with post-op confusion/AMS, slurred speech.  MD adjusting pain meds.     PT Comments   Pt is POD #5 with continued confusion, decreased attention, decreased safety awareness, decreased STM, and slow to process information.  She was not able to walk into the hallway this AM due to feeling hot and lightheaded.  PT to check back later this PM to try to progress gait into hallway with chair to follow for safety as pt has increased knee flexion and is unaware of leg weakness.    Follow Up Recommendations  No PT follow up (as long as pt clears mentally/cognition)     Does the patient have the potential to tolerate intense rehabilitation    NA  Barriers to Discharge   None      Equipment Recommendations  Rolling walker with 5" wheels    Recommendations for Other Services   None  Frequency Min 5X/week   Progress towards PT Goals Progress towards PT goals: Not progressing toward goals - comment (due to lethargy, confusion)  Plan Current plan remains appropriate    Precautions / Restrictions Precautions Precautions: Back Precaution Comments: Reviewed back precautions with pt and family as pt is unable to recall precautions at this time.   Required Braces or Orthoses:  (None)   Pertinent Vitals/Pain See vitals flow sheet.    Mobility  Bed Mobility Bed Mobility: Rolling Left;Left Sidelying to Sit;Sitting - Scoot to Edge of Bed Rolling Left: 4: Min guard;With rail Left Sidelying to Sit: 4: Min guard;HOB flat;With rails Sitting - Scoot to Edge of Bed: 5: Supervision;With rail Sit to Sidelying Left: 5: Supervision;With rail;HOB  flat Details for Bed Mobility Assistance: Verbal cues for technique to maintain back precautions, min guard assist to help with hand placement and prevent twisting during transition to sitting.  Max verbal cues to slow down and wait for therapist for next step.   Transfers Sit to Stand: 4: Min assist;With armrests;From bed;From toilet Stand to Sit: 4: Min assist;With upper extremity assist;To toilet;To bed Details for Transfer Assistance: Min assist to steady pt over flexed knees during transition especially from lower toilet.  Max verbal cues for safe hand palcement during transitions.   Ambulation/Gait Ambulation/Gait Assistance: 4: Min assist Ambulation Distance (Feet): 15 Feet Assistive device: Rolling walker Ambulation/Gait Assistance Details: cues for upright posture and knee extension as pt was walking and standing at times with flexed knee posture.  With cues she was able to right herself.   Gait Pattern: Step-through pattern;Right flexed knee in stance;Left flexed knee in stance;Trunk flexed General Gait Details: Pt felt lightheaded and "hot" after going to the bathroom.  Vitals checked and O2 sats 100% on RA, HR 89 bpm and BP 128/68 in sitting.  Cold cloth given and pt repositioned back in the bed.  PT to check back after lunch to re-attempt gait into the hallway.        PT Goals (current goals can now be found in the care plan section) Acute Rehab PT Goals Patient Stated Goal: family would like the post-op confusion to clear.    Visit Information  Last PT Received On: 06/30/13 Assistance Needed: +1 History of Present Illness: 57 y/o  female s/p Lumbar four-five, [name].  Pt with post-op confusion/AMS, slurred speech.  MD adjusting pain meds.      Subjective Data  Subjective: Pt not oriented to day, date, how long she has been in the hospital, and calling daughter by son's name.   Patient Stated Goal: family  would like the post-op confusion to clear.     Cognition  Cognition Arousal/Alertness: Lethargic Behavior During Therapy: Impulsive (mildly impulsive, needs verbal cues for safety) Overall Cognitive Status: Impaired/Different from baseline (likely due to heavy pain meds, MD adjusting at this time) Area of Impairment: Attention;Memory;Safety/judgement;Awareness;Orientation Orientation Level: Disoriented to;Person;Time Current Attention Level: Focused Memory: Decreased recall of precautions;Decreased short-term memory Safety/Judgement: Decreased awareness of safety;Decreased awareness of deficits Awareness: Intellectual General Comments: Pt needs cues for safety, in bathroom was unaware that her legs were becoming weak and more flexed when standing to wash hands at the sink.         End of Session PT - End of Session Activity Tolerance: Patient limited by fatigue;Patient limited by lethargy Patient left: in bed;with call bell/phone within reach;with family/visitor present (husband and daughter) Nurse Communication: Mobility status    Crystal Larsen, PT, DPT [phone]   06/30/2013, 10:53 AM

## 2013-06-30 NOTE — Progress Notes (Signed)
Physical Therapy Treatment Patient Details Name: Crystal Larsen MRN: [id number] DOB: 13-Jan-1956 Today's Date: 06/30/2013 Time: 4098-1191 PT Time Calculation (min): 28 min  PT Assessment / Plan / Recommendation  History of Present Illness [name], [name].  Pt with post-op confusion/AMS, slurred speech.  MD adjusting pain meds.     PT Comments   Pt is a little clearer this afternoon in regards to her cognition.  She is still not at baseline, but she has less word retrieval difficulties and is more oriented.  She was able to walk with minimal assistance around the entire unit, but is at high risk for falls as she is constantly taking her hands off of the RW while walking and had 4-5 LOB during gait. She needed constant reinforcement of safety cues.  Because of her continued cognitive deficits and balance deficits I am recommending HHPT f/u at discharge.   Follow Up Recommendations  Home health PT (due to continued safety and balance deficits)     Does the patient have the potential to tolerate intense rehabilitation    Yes  Barriers to Discharge   NA      Equipment Recommendations  Rolling walker with 5" wheels    Recommendations for Other Services   none  Frequency Min 5X/week   Progress towards PT Goals Progress towards PT goals: Progressing toward goals  Plan Current plan remains appropriate    Precautions / Restrictions Precautions Precautions: Back Precaution Comments: Pt able to report 2/3 back precautions.  Reviewed back precautions and functional examples of when she might break them.   Required Braces or Orthoses:  (None)   Pertinent Vitals/Pain See vitals flow sheet.     Mobility  Bed Mobility Bed Mobility: Not assessed (pt OOB in bathroom with RN tech) Transfers Sit to Stand: 4: Min assist;With upper extremity assist;From toilet Stand to Sit: 4: Min assist;With upper  extremity assist;With armrests;To chair/3-in-1 Details for Transfer Assistance: Min assist to support trunk for balance over weak knees.   Ambulation/Gait Ambulation/Gait Assistance: 4: Min assist Ambulation Distance (Feet): 400 Feet Assistive device: Rolling walker Ambulation/Gait Assistance Details: min assist for balance as pt would frequently take one hand off of RW while walking and lose her balance.  Max verbal cues to stop, then use the hand, then keep walking.   Gait Pattern: Step-through pattern;Trunk flexed General Gait Details: No reports of being light headed this afternoon.        PT Goals (current goals can now be found in the care plan section) Acute Rehab PT Goals Patient Stated Goal: family would like the post-op confusion to clear.    Visit Information  Last PT Received On: 06/30/13 Assistance Needed: +1 History of Present Illness: [name], [name].  Pt with post-op confusion/AMS, slurred speech.  MD adjusting pain meds.      Subjective Data  Subjective: Pt seemed mildly clearer this PM.  Remebers therapist visit in the AM.   Patient Stated Goal: family would like the post-op confusion to clear.     Cognition  Cognition Arousal/Alertness: Awake/alert Behavior During Therapy: Impulsive Overall Cognitive Status: Impaired/Different from baseline Area of Impairment: Attention;Memory;Safety/judgement;Awareness Current Attention Level: Focused Memory: Decreased recall of precautions;Decreased short-term memory Safety/Judgement: Decreased awareness of safety Awareness: Emergent General Comments: Pt needed repeated cues for safety throughout gait with RW.  End of Session PT - End of Session Activity Tolerance: Patient tolerated treatment well Patient left: in chair;with call bell/phone within reach Nurse Communication: Mobility status (need a chair alarm, RN aware  and looking for one)    Lurena Joiner B. Delaine Canter, PT, DPT [phone]   06/30/2013, 4:00 PM

## 2013-06-30 NOTE — Progress Notes (Signed)
Sedated. No weakneass, plan to stop strong pain medications including baclofen and benadryl. Spoke with husband and daughter

## 2013-07-01 MED ORDER — CYCLOBENZAPRINE HCL 10 MG PO TABS
10.0000 mg | ORAL_TABLET | Freq: Three times a day (TID) | ORAL | Status: DC
  Administered 2013-07-01 – 2013-07-02 (×3): 10 mg via ORAL
  Filled 2013-07-01 (×5): qty 1

## 2013-07-01 NOTE — Progress Notes (Signed)
C/o spasms. Increase urinary frequency, negative urine

## 2013-07-01 NOTE — Progress Notes (Signed)
Pt much more alert this morning, oriented x4, eating breakfast says her head is just a "little foggy, but not bad". Did have a small BM today and is starting to feel "less bloated."  Has not had much pain medicine over the last 24hrs and c/o only spasms especially while voiding. Family agrees that pts mental status is starting to appear at her baseline now. Will continue to monitor.

## 2013-07-01 NOTE — Progress Notes (Signed)
Physical Therapy Treatment Patient Details Name: Crystal Larsen MRN: [id number] DOB: 10-18-1956 Today's Date: 07/01/2013 Time: 0865-7846 PT Time Calculation (min): 44 min  PT Assessment / Plan / Recommendation  History of Present Illness [name], [name].  Pt with post-op confusion/AMS, slurred speech.  MD adjusting pain meds.     PT Comments   Appears more like herself today. Still feels a bit cloudy with the situation. Ambulating at supervision level. Making great progress.  Follow Up Recommendations  Home health PT; 24 hr assist     Does the patient have the potential to tolerate intense rehabilitation     Barriers to Discharge        Equipment Recommendations  Rolling walker with 5" wheels    Recommendations for Other Services    Frequency Min 5X/week   Progress towards PT Goals Progress towards PT goals: Progressing toward goals  Plan Current plan remains appropriate    Precautions / Restrictions Precautions Precautions: Back Precaution Comments: Pt able to report 3/3 back precautions.  Reviewed back precautions and functional examples of when she might break them.  Needs several reminders to not twist during functional activities   Pertinent Vitals/Pain Some spasm in her right leg initially, repositioned and it improved   Mobility  Bed Mobility Bed Mobility: Not assessed Transfers Transfers: Sit to Stand;Stand to Sit Sit to Stand: 4: Min assist;4: Min guard;With upper extremity assist Stand to Sit: 4: Min guard;With upper extremity assist Details for Transfer Assistance: to stand from surface without armrests she needed minA for stability and sequencing cues, stood mingaurdA from chair with armrests Ambulation/Gait Ambulation/Gait Assistance: 5: Supervision Ambulation Distance (Feet): 450 Feet Assistive device: Rolling walker Ambulation/Gait Assistance Details: cues for  safety with RW Gait velocity: decreased Stairs: Yes Stairs Assistance: 4: Min guard Stairs Assistance Details (indicate cue type and reason): attempted steps sideways and forwards with rail on the right, good performance, mingaurdA Stair Management Technique: Sideways;Forwards;One rail Right;Step to pattern Number of Stairs: 4 (2x2)      PT Goals (current goals can now be found in the care plan section)    Visit Information  Last PT Received On: 07/01/13 Assistance Needed: +1 History of Present Illness: [name], [name].  Pt with post-op confusion/AMS, slurred speech.  MD adjusting pain meds.      Subjective Data      Cognition  Cognition Arousal/Alertness: Awake/alert Overall Cognitive Status: Within Functional Limits for tasks assessed Awareness: Emergent General Comments: x2 took her hand off the RW during ambulation, seemed more aware of this today, much clearer today, a bit anxious and at times emotional    Balance     End of Session PT - End of Session Equipment Utilized During Treatment: Gait belt Activity Tolerance: Patient tolerated treatment well Patient left: in chair;with call bell/phone within reach;with chair alarm set Nurse Communication: Mobility status   GP     Ludger Nutting 07/01/2013, 4:01 PM

## 2013-07-01 NOTE — Progress Notes (Signed)
Patient ID: Crystal Larsen, female   DOB: Apr 11, 1956, 56 y.o.   MRN: [id number] Mentally back to normal. C/o muscle spasms and increase of urinary frequency. See orders

## 2013-07-01 NOTE — Progress Notes (Signed)
UR COMPLETED  

## 2013-07-02 LAB — URINE CULTURE: Colony Count: 80000

## 2013-07-02 NOTE — Progress Notes (Signed)
Discharge information reviewed with patient and family. No questions asked at this time. All belongs sent with patient.  Arthor Captain, RN

## 2013-07-02 NOTE — Discharge Summary (Signed)
Physician Discharge Summary  Patient ID: Crystal Larsen MRN: [id number] DOB/AGE: December 22, 1955 57 y.o.  Admit date: 06/25/2013 Discharge date: 07/02/2013  Admission Diagnoses:l45, l5s1 spondylolisthesis. Severe spinal stenosis  Discharge Diagnoses: same Active Problems:   * No active hospital problems. *   Discharged Condition:no pain  Hospital Course: surgery. oversedated Consults  none  Significant Diagnostic Studies: mri  Treatments: l4 to s1 fusion  Discharge Exam: Blood pressure 92/53, pulse 85, temperature 97.9 F (36.6 C), temperature source Oral, resp. rate 20, height 5\' 7"  (1.702 m), weight 94.4 kg (208 lb 1.8 oz), SpO2 100.00%. Ambulating, no pain  Disposition: home. To see me in 3 weeks   Future Appointments Provider Department Dept Phone   10/02/2013 1:00 PM Melvyn Novas, MD [facility] [phone]       Medication List    ASK your doctor about these medications       aspirin 325 MG tablet  Take 325 mg by mouth daily.     celecoxib 200 MG capsule  Commonly known as:  CELEBREX  Take 200 mg by mouth daily.     desvenlafaxine 50 MG 24 hr tablet  Commonly known as:  PRISTIQ  Take 50 mg by mouth daily.     divalproex 500 MG 24 hr tablet  Commonly known as:  DEPAKOTE ER  TAKE ONE TABLET BY MOUTH TWICE DAILY     Fish Oil 1000 MG Caps  Take 3 capsules by mouth daily.     levothyroxine 175 MCG tablet  Commonly known as:  SYNTHROID, LEVOTHROID  Take 175 mcg by mouth daily before breakfast.     lisinopril 40 MG tablet  Commonly known as:  PRINIVIL,ZESTRIL  Take 40 mg by mouth daily.     Melatonin 10 MG Tbcr  Take 1 tablet by mouth at bedtime.     MINIVELLE 0.1 MG/24HR patch  Generic drug:  estradiol  Place 1 patch onto the skin 2 (two) times a week.     multivitamin capsule  Take 1 capsule by mouth daily.     niacin 500 MG CR tablet  Commonly known as:  NIASPAN  Take 500 mg by mouth at bedtime.     oxyCODONE 10 MG 12  hr tablet  Commonly known as:  OXYCONTIN  Take 5-10 mg by mouth every 12 (twelve) hours.     pantoprazole 40 MG tablet  Commonly known as:  PROTONIX  Take 40 mg by mouth daily.     progesterone 100 MG capsule  Commonly known as:  PROMETRIUM  Take 100 mg by mouth daily.     SLEEP AID PO  Take 1 tablet by mouth at bedtime.     triamterene-hydrochlorothiazide 37.5-25 MG per tablet  Commonly known as:  MAXZIDE-25  Take 0.5 tablets by mouth daily.         Signed: Karn Cassis 07/02/2013, 10:13 AM

## 2013-07-02 NOTE — Progress Notes (Signed)
Orthopedic Tech Progress Note Patient Details:  Crystal Larsen [birth year] [id number]  Patient ID: Crystal Larsen, female   DOB: February 20, 1956, 57 y.o.   MRN: [id number]   Crystal Larsen 07/02/2013, 2:18 PMLumbar corset completed by bio-tech.

## 2013-07-02 NOTE — Progress Notes (Signed)
Physical Therapy Treatment Patient Details Name: Crystal Larsen MRN: [id number] DOB: Oct 29, 1956 Today's Date: 07/02/2013 Time: 4098-1191 PT Time Calculation (min): 25 min  PT Assessment / Plan / Recommendation  History of Present Illness [name], [name].  Pt with post-op confusion/AMS, slurred speech.  MD adjusting pain meds.     PT Comments   Pt presents with clearer mentation this session, occasionally forgetful about insignificant things, however safely maneuvers with RW and maintains back precautions without cues.  Ascend/descend stairs and has equipment on order. NO need for HHPT at d/c, which is planned for today.     Follow Up Recommendations  No PT follow up     Does the patient have the potential to tolerate intense rehabilitation     Barriers to Discharge        Equipment Recommendations       Recommendations for Other Services    Frequency Min 5X/week   Progress towards PT Goals Progress towards PT goals: Progressing toward goals  Plan Current plan remains appropriate    Precautions / Restrictions Precautions Precautions: Back Precaution Comments: demonstrates consistent application of back precautions this session   Pertinent Vitals/Pain No c/o     Mobility  Bed Mobility Bed Mobility: Rolling Left;Left Sidelying to Sit;Sitting - Scoot to Edge of Bed Rolling Left: 7: Independent Left Sidelying to Sit: 7: Independent;HOB flat Sitting - Scoot to Edge of Bed: 7: Independent Transfers Transfers: Sit to Stand;Stand to Sit Sit to Stand: 5: Supervision;With upper extremity assist;From bed;From toilet Stand to Sit: 5: Supervision;With upper extremity assist;To chair/3-in-1;To toilet Details for Transfer Assistance: struggles from lower surfaces, however moves well without cues or reminders to/from higher seats and seats with armrests Ambulation/Gait Ambulation/Gait  Assistance: 5: Supervision Ambulation Distance (Feet): 80 Feet Assistive device: Rolling walker Ambulation/Gait Assistance Details: able to walk and talk without loss of balance or stopping Gait Pattern: Step-through pattern Stairs: Yes Stairs Assistance: 5: Supervision Stairs Assistance Details (indicate cue type and reason): demonstrational cues for ascend/descend with walker Stair Management Technique: No rails;Backwards;Forwards;With walker;Step to pattern Number of Stairs: 2    Exercises     PT Diagnosis:    PT Problem List:   PT Treatment Interventions:     PT Goals (current goals can now be found in the care plan section) Acute Rehab PT Goals PT Goal Formulation: With patient Time For Goal Achievement: 07/03/13 Potential to Achieve Goals: Good  Visit Information  Last PT Received On: 07/02/13 Assistance Needed: +1 History of Present Illness: [name], [name].  Pt with post-op confusion/AMS, slurred speech.  MD adjusting pain meds.      Subjective Data      Cognition  Cognition Arousal/Alertness: Awake/alert Behavior During Therapy: WFL for tasks assessed/performed Overall Cognitive Status: Within Functional Limits for tasks assessed Current Attention Level: Selective Awareness: Intellectual General Comments: pt did not demonstrate behavior that would suggest cognitive problems this session.  Occasionally she will remember after the fact to (eg) wash hands after toileting; did not pose any overt safety concerns    Balance     End of Session PT - End of Session Activity Tolerance: Patient tolerated treatment well Patient left: in chair;with call bell/phone within reach;with chair alarm set Nurse Communication: Mobility status   GP     Dennis Bast 07/02/2013, 1:45 PM

## 2013-07-04 ENCOUNTER — Other Ambulatory Visit (HOSPITAL_COMMUNITY): Payer: 59

## 2013-08-19 ENCOUNTER — Other Ambulatory Visit: Payer: Self-pay | Admitting: Family Medicine

## 2013-08-19 DIAGNOSIS — E049 Nontoxic goiter, unspecified: Secondary | ICD-10-CM

## 2013-08-21 ENCOUNTER — Ambulatory Visit
Admission: RE | Admit: 2013-08-21 | Discharge: 2013-08-21 | Disposition: A | Discharge status: Home / Self Care | Payer: 59 | Source: Ambulatory Visit | Attending: Family Medicine | Admitting: Family Medicine

## 2013-08-21 ENCOUNTER — Other Ambulatory Visit: Payer: 59

## 2013-08-21 DIAGNOSIS — E049 Nontoxic goiter, unspecified: Secondary | ICD-10-CM

## 2013-09-16 ENCOUNTER — Other Ambulatory Visit: Payer: Self-pay | Admitting: Neurology

## 2013-09-30 ENCOUNTER — Encounter (INDEPENDENT_AMBULATORY_CARE_PROVIDER_SITE_OTHER): Payer: Self-pay

## 2013-09-30 ENCOUNTER — Encounter: Payer: Self-pay | Admitting: Neurology

## 2013-09-30 ENCOUNTER — Ambulatory Visit (INDEPENDENT_AMBULATORY_CARE_PROVIDER_SITE_OTHER): Payer: 59 | Admitting: Neurology

## 2013-09-30 VITALS — BP 105/72 | HR 73 | Resp 16 | Ht 68.0 in | Wt 186.0 lb

## 2013-09-30 DIAGNOSIS — F0781 Postconcussional syndrome: Secondary | ICD-10-CM | POA: Insufficient documentation

## 2013-09-30 DIAGNOSIS — G44309 Post-traumatic headache, unspecified, not intractable: Secondary | ICD-10-CM

## 2013-09-30 MED ORDER — DESVENLAFAXINE SUCCINATE ER 100 MG PO TB24: 100.0000 mg | ORAL_TABLET | Freq: Every day | ORAL | Status: DC

## 2013-09-30 MED ORDER — DIVALPROEX SODIUM ER 250 MG PO TB24: 250.0000 mg | ORAL_TABLET | Freq: Two times a day (BID) | ORAL | Status: DC

## 2013-09-30 NOTE — Patient Instructions (Signed)

## 2013-09-30 NOTE — Progress Notes (Signed)
[facility]  Provider:  Melvyn Novas, M D  Referring Provider: Halina Maidens., MD Primary Care Physician:  Warrick Parisian, MD  Chief Complaint  Patient presents with  . Headache/Dizziness    Annual    HPI:  Crystal Larsen is a 57 y.o. female  Is seen here as a revisit  from Dr. Creta Levin for her annual visit , post concussion syndrome.  Crystal Larsen was last seen in our office in November 2013 by Penelope Galas, she had originally been a patient of Dr. Alfredo Bach 20 years ago and later followed with Dr. Clint Bolder. Dr. Alfredo Bach had started the patient on fracture or for the treatment of dizziness after concussion. She remained having occasional headaches and balance issues.  Effexor also caused severe withdrawal symptoms , when not taken on a regular basis. These are described as electric shock sensations, like  a "cattle prod "and are frequently felt I man and within is a skipping  doses or even several doses of Effexor.  The patient change to Pristiq 50 mg daily.  She felt  Good,  she denied any depression- sometime she noticed a milder stinging sensation not nearly the degree that she had suffered under been taking Effexor. Discontinued the Pristiq , but needed to return after she had family stressors, grief and loss reaction earlier this year.   Christmas eve in 2011 she had a vision, a visual hallucination, thought to be a  migraine aura. She continues to take depakote Bid.     Review of Systems: Out of a complete 14 system review, the patient complains of only the following symptoms, and all other reviewed systems are negative. weight gain, menopause , hospital surgery - back  pain was  improving  right  after surgery , now, not longer on ATB> able to stand and walk.   History   Social History  . Marital Status: Married    Spouse Name: N/A    Number of Children: 2  . Years of Education: N/A   Occupational History  . retired    Social History Main  Topics  . Smoking status: Never Smoker   . Smokeless tobacco: Never Used  . Alcohol Use: 1.2 oz/week    2 Glasses of wine per week     Comment: ocass  . Drug Use: No  . Sexual Activity: Not on file   Other Topics Concern  . Not on file   Social History Narrative  . No narrative on file    Family History  Problem Relation Age of Onset  . Heart disease Maternal Grandfather     PGF  . Alcohol abuse Maternal Grandfather   . Hyperlipidemia Maternal Grandfather   . Anxiety disorder Sister   . Asthma Mother   . Lung cancer Mother   . Fibromyalgia Sister   . Nephrolithiasis Daughter   . Heart disease Father     Past Medical History  Diagnosis Date  . GERD (gastroesophageal reflux disease)   . Hyperthyroidism   . Anxiety and depression   . Arthritis   . Gallstones   . HTN (hypertension)   . Hypothyroidism   . IBS (irritable bowel syndrome)     ?  . Obesity   . Depression   . PONV (postoperative nausea and vomiting) 80    only time  . Sleep apnea     does not use cpap  . Seizures 03     due to rx  . Chronic headaches  Past Surgical History  Procedure Laterality Date  . Cholecystectomy    . Nasal sinus surgery    . Ganglian cyst right ankle    . Cesarean section    . Bunionectomy      left  . Rotator cuff repair Right     right    Current Outpatient Prescriptions  Medication Sig Dispense Refill  . aspirin 325 MG tablet Take 325 mg by mouth daily.      . celecoxib (CELEBREX) 200 MG capsule Take 200 mg by mouth daily.        [name] desvenlafaxine (PRISTIQ) 50 MG 24 hr tablet Take 100 mg by mouth daily.       . divalproex (DEPAKOTE ER) 500 MG 24 hr tablet TAKE ONE TABLET BY MOUTH TWICE DAILY  180 tablet  0  . Doxylamine Succinate, Sleep, (SLEEP AID PO) Take 1 tablet by mouth at bedtime.        [name] estradiol (MINIVELLE) 0.1 MG/24HR Place 1 patch onto the skin 2 (two) times a week.      . levothyroxine (SYNTHROID, LEVOTHROID) 175 MCG tablet Take 175 mcg by mouth  daily before breakfast.      . lisinopril (PRINIVIL,ZESTRIL) 40 MG tablet Take 40 mg by mouth daily.        . Melatonin 10 MG TBCR Take 1 tablet by mouth at bedtime.        . Multiple Vitamin (MULTIVITAMIN) capsule Take 1 capsule by mouth daily.        . niacin (NIASPAN) 500 MG CR tablet Take 500 mg by mouth at bedtime.      . Omega-3 Fatty Acids (FISH OIL) 1000 MG CAPS Take 3 capsules by mouth daily.        [name] oxyCODONE (OXYCONTIN) 10 MG 12 hr tablet Take 5-10 mg by mouth every 12 (twelve) hours.      . pantoprazole (PROTONIX) 40 MG tablet Take 40 mg by mouth daily.        . progesterone (PROMETRIUM) 100 MG capsule Take 100 mg by mouth daily.      [name] triamterene-hydrochlorothiazide (MAXZIDE-25) 37.5-25 MG per tablet Take 0.5 tablets by mouth daily.        No current facility-administered medications for this visit.    Allergies as of 09/30/2013 - Review Complete 09/30/2013  Allergen Reaction Noted  . Erythromycin  09/21/2008  . Sulfonamide derivatives  09/21/2008  . Tramadol  06/12/2013  . Adhesive [tape] Rash 06/20/2013    Vitals: BP 105/72  Pulse 73  Resp 16  Ht 5\' 8"  (1.727 m)  Wt 186 lb (84.369 kg)  BMI 28.29 kg/m2 Last Weight:  Wt Readings from Last 1 Encounters:  09/30/13 186 lb (84.369 kg)   Last Height:   Ht Readings from Last 1 Encounters:  09/30/13 5\' 8"  (1.727 m)   Physical exam:  General: The patient is awake, alert and appears not in acute distress. The patient is well groomed. Head: Normocephalic, atraumatic. Neck is supple. Mallampati 3 , neck circumference: 14 , retrognathia, there is evidence of  Bruxism.  Cardiovascular:  Regular rate and rhythm 68 without  murmurs or carotid bruit, and without distended neck veins. Respiratory: Lungs are clear to auscultation. Skin:  Without evidence of edema, or rash Trunk: BMI is still elevated and patient  has normal posture.  Neurologic exam : The patient is awake and alert, oriented to place and time.  Memory  subjective  described as intact. There is a normal attention span &  concentration ability.  Speech is fluent without   dysarthria, dysphonia or aphasia. Mood and affect are appropriate.  Cranial nerves: Pupils are equal and briskly reactive to light. Funduscopic exam without evidence of pallor or edema. Extraocular movements  in vertical and horizontal planes intact and without nystagmus. Visual fields by finger perimetry are intact. Hearing to finger rub intact.  Facial sensation intact to fine touch. Facial motor strength is symmetric and tongue and uvula move midline.  Motor exam:   Normal tone and normal muscle bulk and symmetric normal strength in all extremities.  Sensory:  Fine touch, pinprick and vibration were normal.  Coordination: Rapid alternating movements in the fingers/hands is tested and normal. Finger-to-nose maneuver tested and normal without evidence of ataxia, dysmetria or tremor.  Gait and station: Patient walks without assistive device . Strength within normal limits. Stance is stable and normal.   Steps are unfragmented. Romberg testing is normal.  Deep tendon reflexes: in the  upper and lower extremities are symmetric and intact. Babinski maneuver downgoing.    Assessment:  After physical and neurologic examination, review of pre-existing records, assessment is : 1) migraine aura without headaches , controlled on Depakote.  2) sleep talking and bruxism.  3) post concussion,  Remote.   Plan:  Treatment plan and additional workup :  Stay on pristiq for post-consussion and depression,  100 mg daily.  Stay on depakote.

## 2013-10-02 ENCOUNTER — Ambulatory Visit: Payer: Self-pay | Admitting: Neurology

## 2014-06-09 ENCOUNTER — Telehealth: Payer: Self-pay | Admitting: Neurology

## 2014-06-09 NOTE — Telephone Encounter (Signed)
Patient would like to discuss Alzheimer testing and has some questions. Please call

## 2014-06-12 NOTE — Telephone Encounter (Signed)
Spoke to patient. She said she would like to be tested for memory loss and have some questions about some minor memory issues she has been having over the past couple of years. The patient is concerned because she has a family hx of Alzheimer's. Advised patient to start first by contacting her PCP in reference to these new problems. Patient agreed.

## 2014-06-26 ENCOUNTER — Other Ambulatory Visit: Payer: Self-pay | Admitting: Neurosurgery

## 2014-06-26 DIAGNOSIS — M5416 Radiculopathy, lumbar region: Secondary | ICD-10-CM

## 2014-07-03 ENCOUNTER — Ambulatory Visit
Admission: RE | Admit: 2014-07-03 | Discharge: 2014-07-03 | Disposition: A | Discharge status: Home / Self Care | Payer: 59 | Source: Ambulatory Visit | Attending: Neurosurgery | Admitting: Neurosurgery

## 2014-07-03 DIAGNOSIS — M5416 Radiculopathy, lumbar region: Secondary | ICD-10-CM

## 2014-07-03 MED ORDER — IOHEXOL 180 MG/ML  SOLN
15.0000 mL | Freq: Once | INTRAMUSCULAR | Status: AC | PRN
  Administered 2014-07-03: 15 mL via INTRATHECAL

## 2014-07-03 MED ORDER — DIAZEPAM 5 MG PO TABS
10.0000 mg | ORAL_TABLET | Freq: Once | ORAL | Status: AC
  Administered 2014-07-03: 10 mg via ORAL

## 2014-07-03 NOTE — Progress Notes (Signed)
Patient states she has been off Pristiq for the past two days.  Brita Romp, RN

## 2014-07-03 NOTE — Discharge Instructions (Signed)
Myelogram Discharge Instructions  1. Go home and rest quietly for the next 24 hours.  It is important to lie flat for the next 24 hours.  Get up only to go to the restroom.  You may lie in the bed or on a couch on your back, your stomach, your left side or your right side.  You may have one pillow under your head.  You may have pillows between your knees while you are on your side or under your knees while you are on your back.  2. DO NOT drive today.  Recline the seat as far back as it will go, while still wearing your seat belt, on the way home.  3. You may get up to go to the bathroom as needed.  You may sit up for 10 minutes to eat.  You may resume your normal diet and medications unless otherwise indicated.  Drink lots of extra fluids today and tomorrow.  4. The incidence of headache, nausea, or vomiting is about 5% (one in 20 patients).  If you develop a headache, lie flat and drink plenty of fluids until the headache goes away.  Caffeinated beverages may be helpful.  If you develop severe nausea and vomiting or a headache that does not go away with flat bed rest, call [phone].  5. You may resume normal activities after your 24 hours of bed rest is over; however, do not exert yourself strongly or do any heavy lifting tomorrow. If when you get up you have a headache when standing, go back to bed and force fluids for another 24 hours.  6. Call your physician for a follow-up appointment.  The results of your myelogram will be sent directly to your physician by the following day.  7. If you have any questions or if complications develop after you arrive home, please call [phone].  Discharge instructions have been explained to the patient.  The patient, or the person responsible for the patient, fully understands these instructions.      May resume Pristig on Aug. 8, 2015, after 8:30 am.

## 2014-09-03 ENCOUNTER — Other Ambulatory Visit: Payer: Self-pay | Admitting: Neurosurgery

## 2014-09-03 DIAGNOSIS — M545 Low back pain: Secondary | ICD-10-CM

## 2014-09-07 ENCOUNTER — Ambulatory Visit
Admission: RE | Admit: 2014-09-07 | Discharge: 2014-09-07 | Disposition: A | Discharge status: Home / Self Care | Payer: 59 | Source: Ambulatory Visit | Attending: Neurosurgery | Admitting: Neurosurgery

## 2014-09-07 VITALS — BP 125/91 | HR 80

## 2014-09-07 DIAGNOSIS — M545 Low back pain: Secondary | ICD-10-CM

## 2014-09-07 MED ORDER — METHYLPREDNISOLONE ACETATE 40 MG/ML INJ SUSP (RADIOLOG
120.0000 mg | Freq: Once | INTRAMUSCULAR | Status: AC
  Administered 2014-09-07: 120 mg via EPIDURAL

## 2014-09-07 MED ORDER — IOHEXOL 180 MG/ML  SOLN
1.0000 mL | Freq: Once | INTRAMUSCULAR | Status: AC | PRN
  Administered 2014-09-07: 1 mL via EPIDURAL

## 2014-09-07 MED ORDER — DIAZEPAM 5 MG PO TABS
10.0000 mg | ORAL_TABLET | Freq: Once | ORAL | Status: AC
  Administered 2014-09-07: 10 mg via ORAL

## 2014-09-07 NOTE — Discharge Instructions (Signed)

## 2014-09-28 ENCOUNTER — Other Ambulatory Visit: Payer: Self-pay | Admitting: Neurosurgery

## 2014-09-30 ENCOUNTER — Encounter: Payer: Self-pay | Admitting: Neurology

## 2014-09-30 ENCOUNTER — Ambulatory Visit (INDEPENDENT_AMBULATORY_CARE_PROVIDER_SITE_OTHER): Payer: 59 | Admitting: Neurology

## 2014-09-30 VITALS — BP 119/82 | HR 88 | Temp 98.0°F | Resp 14 | Ht 67.0 in | Wt 187.0 lb

## 2014-09-30 DIAGNOSIS — G44309 Post-traumatic headache, unspecified, not intractable: Secondary | ICD-10-CM

## 2014-09-30 DIAGNOSIS — R270 Ataxia, unspecified: Secondary | ICD-10-CM

## 2014-09-30 DIAGNOSIS — F0781 Postconcussional syndrome: Secondary | ICD-10-CM

## 2014-09-30 MED ORDER — DIVALPROEX SODIUM ER 250 MG PO TB24: 250.0000 mg | ORAL_TABLET | Freq: Every day | ORAL | Status: DC

## 2014-09-30 MED ORDER — METHYLPHENIDATE HCL 10 MG PO TABS: 10.0000 mg | ORAL_TABLET | Freq: Two times a day (BID) | ORAL | Status: DC

## 2014-09-30 MED ORDER — DESVENLAFAXINE SUCCINATE ER 50 MG PO TB24: 50.0000 mg | ORAL_TABLET | Freq: Every day | ORAL | Status: DC

## 2014-09-30 NOTE — Progress Notes (Signed)
[facility]  Provider:  Larey Larsen, M D  Referring Provider: Herbert Pun., MD Primary Care Physician:  Crystal Logan, MD  MEMORY LOSS;   HPI:  Crystal Larsen is a 58 y.o. female  Is seen here as a revisit  from Crystal Larsen for her annual visit , post concussion syndrome.  Crystal Larsen was last seen in our office in November 2013 by Crystal Larsen, she had originally been a patient of Crystal Larsen 20 years ago and later followed with Crystal Larsen. Crystal Larsen had started the patient on fracture or for the treatment of dizziness after concussion. She remained having occasional headaches and balance issues.  Effexor also caused severe withdrawal symptoms , when not taken on a regular basis. These are described as electric shock sensations, like  a "cattle prod "and are frequently felt I man and within is a skipping  doses or even several doses of Effexor.  The patient change to Pristiq 50 mg daily.  She felt good,  she denied any depression- sometime she noticed a milder stinging sensation not nearly the degree that she had suffered under been taking Effexor. Discontinued the Pristiq , but needed to return after she had family stressors, grief and loss reaction earlier this year.   Christmas eve in 2011 she had a vision, a visual hallucination, thought to be a  migraine aura. She continues to take depakote Bid.    09-30-14 Interval history;  The patient has done well on Pristiq and her postconcussion syndrome is healed, She has a good appetite,  Her sleep is unchanged , she has some balance problems.  Headaches have resolved.  Last Migraine over a year ago. With visual aura.   MOCA 29-30.  Trail making failed.  she gets lost on the way to this office, she forgets words and names and still misplaces objects.  She has forgotten doctors appointments, easily distractable, multitasking is difficult.  She read a book " Still Alice " and she has seen herself in  this story of a 10 some year old woman suffering from Alzheimer's.  A positive dementia family history is also her concern, she retired 6 years ago and has not been involved in many projects she wanted to get involved in.  Crossword puzzles have been fun for her.     Decreased  depakote  To once a day for tremor. Decreased pristiq to 50 mg daily from 100mg .    Will try Ritalin.       Review of Systems: Out of a complete 14 system review, the patient complains of only the following symptoms, and all other reviewed systems are negative. weight gain, menopause , hospital surgery - back  pain was  improving  right  after surgery , now able to stand and walk.   She fell in June 2015 and now has to repeat back surgery .  She had a myelogram , L3 -4 -5-S1 have had surgical fusion.  Can not have a back MRI.   History   Social History  . Marital Status: Married    Spouse Name: N/A    Number of Children: 2  . Years of Education: N/A   Occupational History  . retired    Social History Main Topics  . Smoking status: Never Smoker   . Smokeless tobacco: Never Used  . Alcohol Use: 1.2 oz/week    2 Glasses of wine per week     Comment: ocass  . Drug Use:  No  . Sexual Activity: Not on file   Other Topics Concern  . Not on file   Social History Narrative   Right handed. Caffeine 3 cups coffee daily, BS, Married, 2 kids.     Family History  Problem Relation Age of Onset  . Heart disease Maternal Grandfather     PGF  . Alcohol abuse Maternal Grandfather   . Hyperlipidemia Maternal Grandfather   . Anxiety disorder Sister   . Asthma Mother   . Lung cancer Mother   . Fibromyalgia Sister   . Nephrolithiasis Daughter   . Heart disease Father     Past Medical History  Diagnosis Date  . GERD (gastroesophageal reflux disease)   . Hyperthyroidism   . Anxiety and depression   . Arthritis   . Gallstones   . HTN (hypertension)   . Hypothyroidism   . IBS (irritable bowel syndrome)      ?  . Obesity   . Depression   . PONV (postoperative nausea and vomiting) 80    only time  . Sleep apnea     does not use cpap  . Seizures 03     due to rx  . Chronic headaches     Past Surgical History  Procedure Laterality Date  . Cholecystectomy    . Nasal sinus surgery    . Ganglian cyst right ankle    . Cesarean section    . Bunionectomy      left  . Rotator cuff repair Right     right    Current Outpatient Prescriptions  Medication Sig Dispense Refill  . aspirin 81 MG tablet Take 81 mg by mouth daily.    . celecoxib (CELEBREX) 200 MG capsule Take 400 mg by mouth daily.     [name] desvenlafaxine (PRISTIQ) 100 MG 24 hr tablet Take 1 tablet (100 mg total) by mouth daily. 90 tablet 3  . divalproex (DEPAKOTE ER) 250 MG 24 hr tablet Take 1 tablet (250 mg total) by mouth daily. 90 tablet 3  . levothyroxine (SYNTHROID, LEVOTHROID) 175 MCG tablet Take 175 mcg by mouth daily before breakfast.    . lisinopril (PRINIVIL,ZESTRIL) 40 MG tablet Take 40 mg by mouth daily.      [name] loratadine (CLARITIN) 10 MG tablet Take 10 mg by mouth daily as needed for allergies.    [name] LYRICA 50 MG capsule 2-3 tabs daily    . Melatonin 10 MG TBCR Take 1 tablet by mouth at bedtime.      [name] MINIVELLE 0.0375 MG/24HR 1 patch 2 (two) times a week.     . Multiple Vitamin (MULTIVITAMIN) capsule Take 1 capsule by mouth daily.      . niacin (NIASPAN) 500 MG CR tablet Take 500 mg by mouth at bedtime.    . Omega-3 Fatty Acids (FISH OIL) 1000 MG CAPS Take 3 capsules by mouth daily.      . pantoprazole (PROTONIX) 40 MG tablet Take 40 mg by mouth daily.      . progesterone (PROMETRIUM) 100 MG capsule Take 100 mg by mouth daily.    [name] triamterene-hydrochlorothiazide (MAXZIDE-25) 37.5-25 MG per tablet Take 0.5 tablets by mouth daily.     . Doxylamine Succinate, Sleep, (SLEEP AID PO) Take 1 tablet by mouth at bedtime.      . methylphenidate (RITALIN) 10 MG tablet Take 1 tablet (10 mg total) by mouth 2 (two) times daily.  60 tablet 0   No current facility-administered medications for this visit.  Allergies as of 09/30/2014 - Review Complete 09/30/2014  Allergen Reaction Noted  . Tramadol Other (See Comments) 06/12/2013  . Gabapentin Itching 09/30/2014  . Adhesive [tape] Rash 06/20/2013  . Erythromycin Rash 09/21/2008  . Sulfonamide derivatives Rash 09/21/2008    Vitals: BP 119/82 mmHg  Pulse 88  Temp(Src) 98 F (36.7 C) (Oral)  Resp 14  Ht 5\' 7"  (1.702 m)  Wt 187 lb (84.823 kg)  BMI 29.28 kg/m2 Last Weight:  Wt Readings from Last 1 Encounters:  09/30/14 187 lb (84.823 kg)   Last Height:   Ht Readings from Last 1 Encounters:  09/30/14 5\' 7"  (1.702 m)   Physical exam:  General: The patient is awake, alert and appears not in acute distress. The patient is well groomed. Head: Normocephalic, atraumatic. Neck is supple. Mallampati 3 , neck circumference: 14 , retrognathia, there is evidence of  Bruxism.  Cardiovascular:  Regular rate and rhythm 68 without  murmurs or carotid bruit, and without distended neck veins. Respiratory: Lungs are clear to auscultation. Skin:  Without evidence of edema, or rash Trunk: BMI is still elevated and patient  has normal posture.  Neurologic exam : The patient is awake and alert, oriented to place and time.   Memory subjective  described as impaired.   There is a normal attention span & concentration ability.  Speech is fluent without  dysarthria, dysphonia or aphasia.  Mood and affect are appropriate.  Cranial nerves: Pupils are equal and briskly reactive to light. Funduscopic exam without evidence of pallor or edema. Extraocular movements  in vertical and horizontal planes intact and without nystagmus. Visual fields by finger perimetry are intact. Hearing to finger rub intact.  Facial sensation intact to fine touch. Facial motor strength is symmetric and tongue and uvula move midline.  Motor exam:   Normal tone and normal muscle bulk and symmetric  normal strength in all extremities.  Sensory:  Fine touch, pinprick and vibration were normal.  Coordination: Rapid alternating movements in the fingers/hands is tested and normal. Finger-to-nose maneuver tested and normal without evidence of ataxia, dysmetria or tremor.  Gait and station: Patient walks without assistive device . Strength within normal limits. Stance is stable and normal.   Steps are unfragmented. Romberg testing is normal.  Deep tendon reflexes: in the  upper and lower extremities are symmetric and intact. Babinski maneuver downgoing.    Assessment:  After physical and neurologic examination, review of pre-existing records, assessment is : 1) migraine aura without headaches , controlled on Depakote.  2) sleep talking and bruxism.  3) post concussion,  Remote.   Plan:  Treatment plan and additional workup :  Stay on pristiq for post-consussion and depression,  100 mg daily.  Inattentiveness ?  Started ritalin at low po dose.

## 2014-10-26 NOTE — Pre-Procedure Instructions (Signed)
[facility]  10/26/2014   Your procedure is scheduled on:  Wednesday, December 9th  Report to [place] at 630 AM.  Call this number if you have problems the morning of surgery: [phone]   Remember:   Do not eat food or drink liquids after midnight.   Take these medicines the morning of surgery with A SIP OF WATER: depakote, synthroid, claritin, protonix, lyrica  Stop taking aspirin, OTC vitamins/herbal medications, NSAIDS (ibuprofen, advil, motrin) 5 days prior to surgery.    Do not wear jewelry, make-up or nail polish.  Do not wear lotions, powders, or perfumes, deodorant.  Do not shave 48 hours prior to surgery. Men may shave face and neck.  Do not bring valuables to the hospital.  [facility] is not responsible   for any belongings or valuables.               Contacts, dentures or bridgework may not be worn into surgery.  Leave suitcase in the car. After surgery it may be brought to your room.  For patients admitted to the hospital, discharge time is determined by your treatment team.               Patients discharged the day of surgery will not be allowed to drive home.  Please read over the following fact sheets that you were given: Pain Booklet, Coughing and Deep Breathing, Blood Transfusion Information, MRSA Information and Surgical Site Infection Prevention  [place] - Preparing for Surgery  Before surgery, you can play an important role.  Because skin is not sterile, your skin needs to be as free of germs as possible.  You can reduce the number of germs on you skin by washing with CHG (chlorahexidine gluconate) soap before surgery.  CHG is an antiseptic cleaner which kills germs and bonds with the skin to continue killing germs even after washing.  Please DO NOT use if you have an allergy to CHG or antibacterial soaps.  If your skin becomes reddened/irritated stop using the CHG and inform your nurse when you arrive at Short Stay.  Do not  shave (including legs and underarms) for at least 48 hours prior to the first CHG shower.  You may shave your face.  Please follow these instructions carefully:   1.  Shower with CHG Soap the night before surgery and the morning of Surgery.  2.  If you choose to wash your hair, wash your hair first as usual with your normal shampoo.  3.  After you shampoo, rinse your hair and body thoroughly to remove the shampoo.  4.  Use CHG as you would any other liquid soap.  You can apply CHG directly to the skin and wash gently with scrungie or a clean washcloth.  5.  Apply the CHG Soap to your body ONLY FROM THE NECK DOWN.  Do not use on open wounds or open sores.  Avoid contact with your eyes, ears, mouth and genitals (private parts).  Wash genitals (private parts) with your normal soap.  6.  Wash thoroughly, paying special attention to the area where your surgery will be performed.  7.  Thoroughly rinse your body with warm water from the neck down.  8.  DO NOT shower/wash with your normal soap after using and rinsing off the CHG Soap.  9.  Pat yourself dry with a clean towel.            10.  Wear clean pajamas.  11.  Place clean sheets on your bed the night of your first shower and do not sleep with pets.  Day of Surgery  Do not apply any lotions/deoderants the morning of surgery.  Please wear clean clothes to the hospital/surgery center.

## 2014-10-27 ENCOUNTER — Encounter (HOSPITAL_COMMUNITY)
Admission: RE | Admit: 2014-10-27 | Discharge: 2014-10-27 | Disposition: A | Discharge status: Home / Self Care | Payer: 59 | Source: Ambulatory Visit | Attending: Neurosurgery | Admitting: Neurosurgery

## 2014-10-27 ENCOUNTER — Encounter (HOSPITAL_COMMUNITY): Payer: Self-pay

## 2014-10-27 DIAGNOSIS — I10 Essential (primary) hypertension: Secondary | ICD-10-CM | POA: Diagnosis not present

## 2014-10-27 DIAGNOSIS — K219 Gastro-esophageal reflux disease without esophagitis: Secondary | ICD-10-CM | POA: Insufficient documentation

## 2014-10-27 DIAGNOSIS — Z6829 Body mass index (BMI) 29.0-29.9, adult: Secondary | ICD-10-CM | POA: Diagnosis not present

## 2014-10-27 DIAGNOSIS — E039 Hypothyroidism, unspecified: Secondary | ICD-10-CM | POA: Diagnosis not present

## 2014-10-27 DIAGNOSIS — G4733 Obstructive sleep apnea (adult) (pediatric): Secondary | ICD-10-CM | POA: Insufficient documentation

## 2014-10-27 DIAGNOSIS — R569 Unspecified convulsions: Secondary | ICD-10-CM | POA: Insufficient documentation

## 2014-10-27 DIAGNOSIS — Z01818 Encounter for other preprocedural examination: Secondary | ICD-10-CM | POA: Diagnosis not present

## 2014-10-27 HISTORY — DX: Unsteadiness on feet: R26.81

## 2014-10-27 HISTORY — DX: Polyneuropathy, unspecified: G62.9

## 2014-10-27 LAB — CBC
HCT: 40.6 % (ref 36.0–46.0)
Hemoglobin: 13.7 g/dL (ref 12.0–15.0)
MCH: 28.7 pg (ref 26.0–34.0)
MCHC: 33.7 g/dL (ref 30.0–36.0)
MCV: 85.1 fL (ref 78.0–100.0)
PLATELETS: 234 10*3/uL (ref 150–400)
RBC: 4.77 MIL/uL (ref 3.87–5.11)
RDW: 14.4 % (ref 11.5–15.5)
WBC: 4.5 10*3/uL (ref 4.0–10.5)

## 2014-10-27 LAB — BASIC METABOLIC PANEL
ANION GAP: 13 (ref 5–15)
BUN: 17 mg/dL (ref 6–23)
CALCIUM: 9.8 mg/dL (ref 8.4–10.5)
CO2: 24 mEq/L (ref 19–32)
CREATININE: 0.85 mg/dL (ref 0.50–1.10)
Chloride: 102 mEq/L (ref 96–112)
GFR calc Af Amer: 85 mL/min — ABNORMAL LOW (ref >90)
GFR calc non Af Amer: 74 mL/min — ABNORMAL LOW (ref >90)
Glucose, Bld: 92 mg/dL (ref 70–99)
Potassium: 4.3 mEq/L (ref 3.7–5.3)
Sodium: 139 mEq/L (ref 137–147)

## 2014-10-27 LAB — TYPE AND SCREEN
ABO/RH(D): O POS
ANTIBODY SCREEN: NEGATIVE

## 2014-10-27 LAB — SURGICAL PCR SCREEN
MRSA, PCR: NEGATIVE
Staphylococcus aureus: NEGATIVE

## 2014-10-27 NOTE — Progress Notes (Addendum)
[facility] - [facility] NP  No cardiologist ekg in epic from 2014 no other cardiac testing

## 2014-10-29 NOTE — Progress Notes (Signed)
Anesthesia Chart Review:  Pt is 58 year old female scheduled for augmentation to L3-4 with decompression/fusion/pedicle screws on 11/04/2014 with Dr. Joya Salm.   PMH: HTN, OSA, seizures, hypothyroidism, GERD. BMI 29.  Medications include: ASA, lisinopril, maxzide, ritalin, depakote, levothyroxine, lyrica.   Preoperative labs reviewed.   EKG: NSR. Possible L atrial enlargement.   If no changes, I anticipate pt can proceed with surgery as scheduled.   Willeen Cass, FNP-BC [facility]/Anesthesiology Phone: [phone] 10/29/2014 2:41 PM

## 2014-11-03 MED ORDER — CEFAZOLIN SODIUM-DEXTROSE 2-3 GM-% IV SOLR
2.0000 g | INTRAVENOUS | Status: AC
  Administered 2014-11-04: 2 g via INTRAVENOUS
  Filled 2014-11-03: qty 50

## 2014-11-03 NOTE — H&P (Signed)
Crystal Larsen is an 58 y.o. female.   Chief Complaint: lumbar pain HPI: patient who in the past had a lumbar fusion atl45,l5s1. She did well but she fell and landed in her gluteus and since then she has been complaining of burning sensation in the left thigh and weakness in the right. Medication has not helped. Myelogram shows stenosis and spondylolisthesis at l34.  Past Medical History  Diagnosis Date  . GERD (gastroesophageal reflux disease)   . Anxiety and depression   . Arthritis   . Gallstones   . HTN (hypertension)   . Hypothyroidism   . IBS (irritable bowel syndrome)     ?  . Obesity   . Depression   . PONV (postoperative nausea and vomiting) 80    only time  . Sleep apnea     does not use cpap  . Chronic headaches   . Unsteady gait   . Seizures 03     due to tramadol reaction -- takes depakote for migraine prevention  . Neuropathy     right leg    Past Surgical History  Procedure Laterality Date  . Cholecystectomy    . Nasal sinus surgery    . Ganglian cyst right ankle    . Cesarean section    . Bunionectomy      left  . Rotator cuff repair Right     right  . Back surgery      Family History  Problem Relation Age of Onset  . Heart disease Maternal Grandfather     PGF  . Alcohol abuse Maternal Grandfather   . Hyperlipidemia Maternal Grandfather   . Anxiety disorder Sister   . Asthma Mother   . Lung cancer Mother   . Fibromyalgia Sister   . Nephrolithiasis Daughter   . Heart disease Father    Social History:  reports that she has never smoked. She has never used smokeless tobacco. She reports that she drinks about 1.2 oz of alcohol per week. She reports that she does not use illicit drugs.  Allergies:  Allergies  Allergen Reactions  . Tramadol Other (See Comments)    seizures  . Gabapentin Itching  . Adhesive [Tape] Rash    bandaide  . Erythromycin Rash  . Latex Rash  . Sulfonamide Derivatives Rash    No prescriptions prior to admission     No results found for this or any previous visit (from the past 48 hour(s)). No results found.  Review of Systems  Constitutional: Negative.   Eyes: Negative.   Respiratory: Negative.   Cardiovascular: Negative.   Gastrointestinal: Negative.   Genitourinary: Negative.   Musculoskeletal: Positive for back pain.  Skin: Negative.   Neurological: Positive for sensory change and focal weakness.  Endo/Heme/Allergies: Negative.   Psychiatric/Behavioral: Negative.     There were no vitals taken for this visit. Physical Examhent, nl. Neck, nl. Cv, nl. Lungs,clear. Abdomen, soft. Extremities, nl. NEURO femoral stretch positive bilaterally. Dtr, nl. Sensation normal but she complains of burning in the left thigh. Myelogram shows solid fusion at l45,l5s1. At l34, stenosis and spondylolisthesis   Assessment/Plan Patient to go ahead with decompression and fusion at l34. She and her husband are aware of risks and benefit  Ramonica Grigg M 11/03/2014, 4:51 PM

## 2014-11-04 ENCOUNTER — Inpatient Hospital Stay (HOSPITAL_COMMUNITY): Payer: 59

## 2014-11-04 ENCOUNTER — Encounter (HOSPITAL_COMMUNITY)
Admission: RE | Disposition: A | Discharge status: Home Health | Payer: 59 | Source: Ambulatory Visit | Attending: Neurosurgery

## 2014-11-04 ENCOUNTER — Inpatient Hospital Stay (HOSPITAL_COMMUNITY): Payer: 59 | Admitting: Critical Care Medicine

## 2014-11-04 ENCOUNTER — Inpatient Hospital Stay (HOSPITAL_COMMUNITY): Payer: 59 | Admitting: Emergency Medicine

## 2014-11-04 ENCOUNTER — Inpatient Hospital Stay (HOSPITAL_COMMUNITY)
Admission: RE | Admit: 2014-11-04 | Discharge: 2014-11-07 | DRG: 460 | Disposition: A | Discharge status: Home Health | Payer: 59 | Source: Ambulatory Visit | Attending: Neurosurgery | Admitting: Neurosurgery

## 2014-11-04 ENCOUNTER — Encounter (HOSPITAL_COMMUNITY): Payer: Self-pay | Admitting: Critical Care Medicine

## 2014-11-04 DIAGNOSIS — I10 Essential (primary) hypertension: Secondary | ICD-10-CM | POA: Diagnosis present

## 2014-11-04 DIAGNOSIS — M545 Low back pain: Secondary | ICD-10-CM | POA: Diagnosis present

## 2014-11-04 DIAGNOSIS — G629 Polyneuropathy, unspecified: Secondary | ICD-10-CM | POA: Diagnosis present

## 2014-11-04 DIAGNOSIS — R569 Unspecified convulsions: Secondary | ICD-10-CM | POA: Diagnosis present

## 2014-11-04 DIAGNOSIS — E039 Hypothyroidism, unspecified: Secondary | ICD-10-CM | POA: Diagnosis present

## 2014-11-04 DIAGNOSIS — M5116 Intervertebral disc disorders with radiculopathy, lumbar region: Secondary | ICD-10-CM | POA: Diagnosis present

## 2014-11-04 DIAGNOSIS — G4730 Sleep apnea, unspecified: Secondary | ICD-10-CM | POA: Diagnosis present

## 2014-11-04 DIAGNOSIS — M4316 Spondylolisthesis, lumbar region: Secondary | ICD-10-CM | POA: Diagnosis present

## 2014-11-04 HISTORY — DX: Migraine, unspecified, not intractable, without status migrainosus: G43.909

## 2014-11-04 HISTORY — PX: POSTERIOR LUMBAR FUSION: SHX6036

## 2014-11-04 HISTORY — DX: Personal history of other diseases of the musculoskeletal system and connective tissue: Z87.39

## 2014-11-04 SURGERY — POSTERIOR LUMBAR FUSION 1 LEVEL
Anesthesia: General | Site: Spine Lumbar

## 2014-11-04 MED ORDER — MIDAZOLAM HCL 2 MG/2ML IJ SOLN
INTRAMUSCULAR | Status: AC
  Filled 2014-11-04: qty 2

## 2014-11-04 MED ORDER — LEVOTHYROXINE SODIUM 50 MCG PO TABS
175.0000 ug | ORAL_TABLET | Freq: Every day | ORAL | Status: DC
  Administered 2014-11-05 – 2014-11-07 (×3): 175 ug via ORAL
  Filled 2014-11-04 (×10): qty 1

## 2014-11-04 MED ORDER — HYDROMORPHONE HCL 1 MG/ML IJ SOLN
INTRAMUSCULAR | Status: AC
  Filled 2014-11-04: qty 1

## 2014-11-04 MED ORDER — SODIUM CHLORIDE 0.9 % IV SOLN
INTRAVENOUS | Status: DC
  Administered 2014-11-04 – 2014-11-05 (×3): via INTRAVENOUS

## 2014-11-04 MED ORDER — PROPOFOL 10 MG/ML IV BOLUS
INTRAVENOUS | Status: DC | PRN
Start: 2014-11-04 — End: 2014-11-04
  Administered 2014-11-04: 140 mg via INTRAVENOUS

## 2014-11-04 MED ORDER — SODIUM CHLORIDE 0.9 % IJ SOLN
INTRAMUSCULAR | Status: AC
  Filled 2014-11-04: qty 10

## 2014-11-04 MED ORDER — SODIUM CHLORIDE 0.9 % IJ SOLN: 9.0000 mL | INTRAMUSCULAR | Status: DC | PRN

## 2014-11-04 MED ORDER — PHENOL 1.4 % MT LIQD
1.0000 | OROMUCOSAL | Status: DC | PRN
  Filled 2014-11-04: qty 177

## 2014-11-04 MED ORDER — VANCOMYCIN HCL 1000 MG IV SOLR
INTRAVENOUS | Status: DC | PRN
  Administered 2014-11-04: 1000 mg

## 2014-11-04 MED ORDER — ONDANSETRON HCL 4 MG/2ML IJ SOLN
4.0000 mg | Freq: Four times a day (QID) | INTRAMUSCULAR | Status: DC | PRN
  Filled 2014-11-04: qty 2

## 2014-11-04 MED ORDER — KETOROLAC TROMETHAMINE 30 MG/ML IJ SOLN
INTRAMUSCULAR | Status: AC
  Administered 2014-11-04: 30 mg via INTRAVENOUS
  Filled 2014-11-04: qty 1

## 2014-11-04 MED ORDER — TRIAMTERENE-HCTZ 37.5-25 MG PO TABS
0.5000 | ORAL_TABLET | Freq: Every day | ORAL | Status: DC
  Administered 2014-11-05 – 2014-11-07 (×3): 0.5 via ORAL
  Filled 2014-11-04 (×2): qty 1
  Filled 2014-11-04: qty 0.5
  Filled 2014-11-04: qty 1

## 2014-11-04 MED ORDER — SCOPOLAMINE 1 MG/3DAYS TD PT72
MEDICATED_PATCH | TRANSDERMAL | Status: AC
Start: 2014-11-04 — End: 2014-11-04
  Administered 2014-11-04: 1 via TRANSDERMAL
  Filled 2014-11-04: qty 1

## 2014-11-04 MED ORDER — DIPHENHYDRAMINE HCL 50 MG/ML IJ SOLN
12.5000 mg | Freq: Four times a day (QID) | INTRAMUSCULAR | Status: DC | PRN
  Filled 2014-11-04: qty 0.25

## 2014-11-04 MED ORDER — DEXAMETHASONE SODIUM PHOSPHATE 4 MG/ML IJ SOLN
INTRAMUSCULAR | Status: AC
  Filled 2014-11-04: qty 1

## 2014-11-04 MED ORDER — NALOXONE HCL 0.4 MG/ML IJ SOLN
0.4000 mg | INTRAMUSCULAR | Status: DC | PRN
  Filled 2014-11-04: qty 1

## 2014-11-04 MED ORDER — ZOLPIDEM TARTRATE 5 MG PO TABS
5.0000 mg | ORAL_TABLET | Freq: Every evening | ORAL | Status: DC | PRN
  Administered 2014-11-06: 5 mg via ORAL
  Filled 2014-11-04: qty 1

## 2014-11-04 MED ORDER — SODIUM CHLORIDE 0.9 % IV SOLN: 250.0000 mL | INTRAVENOUS | Status: DC

## 2014-11-04 MED ORDER — LIDOCAINE HCL (CARDIAC) 20 MG/ML IV SOLN
INTRAVENOUS | Status: AC
  Filled 2014-11-04: qty 5

## 2014-11-04 MED ORDER — DICYCLOMINE HCL 20 MG PO TABS
20.0000 mg | ORAL_TABLET | Freq: Three times a day (TID) | ORAL | Status: DC | PRN
  Filled 2014-11-04: qty 1

## 2014-11-04 MED ORDER — DIPHENHYDRAMINE HCL 12.5 MG/5ML PO ELIX
12.5000 mg | ORAL_SOLUTION | Freq: Four times a day (QID) | ORAL | Status: DC | PRN
  Filled 2014-11-04: qty 5

## 2014-11-04 MED ORDER — VANCOMYCIN HCL 1000 MG IV SOLR
INTRAVENOUS | Status: AC
  Filled 2014-11-04: qty 1000

## 2014-11-04 MED ORDER — LACTATED RINGERS IV SOLN
INTRAVENOUS | Status: DC | PRN
  Administered 2014-11-04 (×2): via INTRAVENOUS

## 2014-11-04 MED ORDER — 0.9 % SODIUM CHLORIDE (POUR BTL) OPTIME
TOPICAL | Status: DC | PRN
  Administered 2014-11-04: 1000 mL

## 2014-11-04 MED ORDER — ARTIFICIAL TEARS OP OINT
TOPICAL_OINTMENT | OPHTHALMIC | Status: AC
  Filled 2014-11-04: qty 3.5

## 2014-11-04 MED ORDER — HYDROMORPHONE HCL 1 MG/ML IJ SOLN
0.2500 mg | INTRAMUSCULAR | Status: DC | PRN
  Administered 2014-11-04 (×2): 0.5 mg via INTRAVENOUS

## 2014-11-04 MED ORDER — ROCURONIUM BROMIDE 100 MG/10ML IV SOLN
INTRAVENOUS | Status: DC | PRN
  Administered 2014-11-04: 50 mg via INTRAVENOUS
  Administered 2014-11-04 (×2): 10 mg via INTRAVENOUS

## 2014-11-04 MED ORDER — GLYCOPYRROLATE 0.2 MG/ML IJ SOLN
INTRAMUSCULAR | Status: AC
Start: 2014-11-04 — End: 2014-11-04
  Filled 2014-11-04: qty 3

## 2014-11-04 MED ORDER — LIDOCAINE HCL (CARDIAC) 20 MG/ML IV SOLN
INTRAVENOUS | Status: DC | PRN
  Administered 2014-11-04: 50 mg via INTRAVENOUS

## 2014-11-04 MED ORDER — LISINOPRIL 20 MG PO TABS
40.0000 mg | ORAL_TABLET | Freq: Every day | ORAL | Status: DC
  Administered 2014-11-05 – 2014-11-06 (×2): 40 mg via ORAL
  Filled 2014-11-04: qty 2
  Filled 2014-11-04: qty 1
  Filled 2014-11-04 (×2): qty 2

## 2014-11-04 MED ORDER — ROCURONIUM BROMIDE 50 MG/5ML IV SOLN
INTRAVENOUS | Status: AC
  Filled 2014-11-04: qty 1

## 2014-11-04 MED ORDER — ACETAMINOPHEN 650 MG RE SUPP: 650.0000 mg | RECTAL | Status: DC | PRN

## 2014-11-04 MED ORDER — MEPERIDINE HCL 25 MG/ML IJ SOLN: 6.2500 mg | INTRAMUSCULAR | Status: DC | PRN

## 2014-11-04 MED ORDER — VENLAFAXINE HCL ER 37.5 MG PO CP24
37.5000 mg | ORAL_CAPSULE | Freq: Every day | ORAL | Status: DC
  Filled 2014-11-04 (×2): qty 1

## 2014-11-04 MED ORDER — MENTHOL 3 MG MT LOZG
1.0000 | LOZENGE | OROMUCOSAL | Status: DC | PRN
  Filled 2014-11-04: qty 9

## 2014-11-04 MED ORDER — THROMBIN 20000 UNITS EX SOLR
CUTANEOUS | Status: DC | PRN
  Administered 2014-11-04: 20 mL via TOPICAL

## 2014-11-04 MED ORDER — SENNA 8.6 MG PO TABS
1.0000 | ORAL_TABLET | Freq: Every day | ORAL | Status: DC
  Administered 2014-11-05 – 2014-11-07 (×3): 8.6 mg via ORAL
  Filled 2014-11-04 (×3): qty 1

## 2014-11-04 MED ORDER — ONDANSETRON HCL 4 MG/2ML IJ SOLN
INTRAMUSCULAR | Status: AC
  Filled 2014-11-04: qty 2

## 2014-11-04 MED ORDER — GLYCOPYRROLATE 0.2 MG/ML IJ SOLN
INTRAMUSCULAR | Status: DC | PRN
  Administered 2014-11-04: 0.6 mg via INTRAVENOUS

## 2014-11-04 MED ORDER — BUPIVACAINE LIPOSOME 1.3 % IJ SUSP
20.0000 mL | INTRAMUSCULAR | Status: AC
  Filled 2014-11-04: qty 20

## 2014-11-04 MED ORDER — ACETAMINOPHEN 325 MG PO TABS: 650.0000 mg | ORAL_TABLET | ORAL | Status: DC | PRN

## 2014-11-04 MED ORDER — KETOROLAC TROMETHAMINE 30 MG/ML IJ SOLN
15.0000 mg | Freq: Once | INTRAMUSCULAR | Status: AC | PRN
  Administered 2014-11-04: 30 mg via INTRAVENOUS

## 2014-11-04 MED ORDER — LORATADINE 10 MG PO TABS
10.0000 mg | ORAL_TABLET | Freq: Every day | ORAL | Status: DC | PRN
  Filled 2014-11-04: qty 1

## 2014-11-04 MED ORDER — HYDROMORPHONE HCL 1 MG/ML IJ SOLN
INTRAMUSCULAR | Status: DC
Start: 2014-11-04 — End: 2014-11-04
  Filled 2014-11-04: qty 1

## 2014-11-04 MED ORDER — PROMETHAZINE HCL 25 MG/ML IJ SOLN: 6.2500 mg | INTRAMUSCULAR | Status: DC | PRN

## 2014-11-04 MED ORDER — ROCURONIUM BROMIDE 50 MG/5ML IV SOLN
INTRAVENOUS | Status: AC
  Filled 2014-11-04: qty 2

## 2014-11-04 MED ORDER — METHYLPHENIDATE HCL 5 MG PO TABS
10.0000 mg | ORAL_TABLET | Freq: Two times a day (BID) | ORAL | Status: DC
  Administered 2014-11-04 – 2014-11-06 (×3): 10 mg via ORAL
  Filled 2014-11-04 (×5): qty 2

## 2014-11-04 MED ORDER — FENTANYL CITRATE 0.05 MG/ML IJ SOLN
INTRAMUSCULAR | Status: DC | PRN
  Administered 2014-11-04: 50 ug via INTRAVENOUS
  Administered 2014-11-04 (×2): 25 ug via INTRAVENOUS
  Administered 2014-11-04 (×6): 50 ug via INTRAVENOUS

## 2014-11-04 MED ORDER — SODIUM CHLORIDE 0.9 % IJ SOLN
3.0000 mL | Freq: Two times a day (BID) | INTRAMUSCULAR | Status: DC
  Administered 2014-11-05 – 2014-11-06 (×3): 3 mL via INTRAVENOUS

## 2014-11-04 MED ORDER — PREGABALIN 50 MG PO CAPS
50.0000 mg | ORAL_CAPSULE | Freq: Every day | ORAL | Status: DC
  Administered 2014-11-04 – 2014-11-07 (×4): 50 mg via ORAL
  Filled 2014-11-04 (×4): qty 1

## 2014-11-04 MED ORDER — ONDANSETRON HCL 4 MG/2ML IJ SOLN
INTRAMUSCULAR | Status: DC | PRN
  Administered 2014-11-04: 4 mg via INTRAVENOUS

## 2014-11-04 MED ORDER — FENTANYL 10 MCG/ML IV SOLN
INTRAVENOUS | Status: DC
  Administered 2014-11-04: 0 ug via INTRAVENOUS
  Administered 2014-11-04: 14:00:00 via INTRAVENOUS
  Administered 2014-11-05 (×3): 0 mL via INTRAVENOUS
  Administered 2014-11-05: 0 ug via INTRAVENOUS
  Administered 2014-11-05 (×2): 30 ug via INTRAVENOUS
  Administered 2014-11-06: 0 via INTRAVENOUS
  Administered 2014-11-06: 30 ug via INTRAVENOUS
  Filled 2014-11-04 (×2): qty 50

## 2014-11-04 MED ORDER — NEOSTIGMINE METHYLSULFATE 10 MG/10ML IV SOLN
INTRAVENOUS | Status: AC
  Filled 2014-11-04: qty 1

## 2014-11-04 MED ORDER — SODIUM CHLORIDE 0.9 % IJ SOLN: 3.0000 mL | INTRAMUSCULAR | Status: DC | PRN

## 2014-11-04 MED ORDER — BUPIVACAINE LIPOSOME 1.3 % IJ SUSP
INTRAMUSCULAR | Status: DC | PRN
  Administered 2014-11-04: 20 mL

## 2014-11-04 MED ORDER — PANTOPRAZOLE SODIUM 40 MG PO TBEC
40.0000 mg | DELAYED_RELEASE_TABLET | Freq: Every day | ORAL | Status: DC
  Administered 2014-11-04 – 2014-11-07 (×4): 40 mg via ORAL
  Filled 2014-11-04 (×4): qty 1

## 2014-11-04 MED ORDER — FENTANYL CITRATE 0.05 MG/ML IJ SOLN
INTRAMUSCULAR | Status: AC
  Filled 2014-11-04: qty 5

## 2014-11-04 MED ORDER — DEXAMETHASONE SODIUM PHOSPHATE 4 MG/ML IJ SOLN
INTRAMUSCULAR | Status: DC | PRN
  Administered 2014-11-04: 4 mg via INTRAVENOUS

## 2014-11-04 MED ORDER — DEXTROSE 5 % IV SOLN
10.0000 mg | INTRAVENOUS | Status: DC | PRN
  Administered 2014-11-04: 10 ug/min via INTRAVENOUS

## 2014-11-04 MED ORDER — ONDANSETRON HCL 4 MG/2ML IJ SOLN: 4.0000 mg | INTRAMUSCULAR | Status: DC | PRN

## 2014-11-04 MED ORDER — PHENYLEPHRINE HCL 10 MG/ML IJ SOLN
INTRAMUSCULAR | Status: DC | PRN
  Administered 2014-11-04: 40 ug via INTRAVENOUS

## 2014-11-04 MED ORDER — MIDAZOLAM HCL 2 MG/2ML IJ SOLN: 0.5000 mg | Freq: Once | INTRAMUSCULAR | Status: DC | PRN

## 2014-11-04 MED ORDER — DIVALPROEX SODIUM ER 250 MG PO TB24
250.0000 mg | ORAL_TABLET | Freq: Every day | ORAL | Status: DC
  Administered 2014-11-05 – 2014-11-07 (×3): 250 mg via ORAL
  Filled 2014-11-04 (×5): qty 1

## 2014-11-04 MED ORDER — CEFAZOLIN SODIUM 1-5 GM-% IV SOLN
1.0000 g | Freq: Three times a day (TID) | INTRAVENOUS | Status: AC
  Administered 2014-11-04 – 2014-11-05 (×2): 1 g via INTRAVENOUS
  Filled 2014-11-04 (×2): qty 50

## 2014-11-04 MED ORDER — DIAZEPAM 5 MG PO TABS
5.0000 mg | ORAL_TABLET | Freq: Four times a day (QID) | ORAL | Status: DC | PRN
  Administered 2014-11-05 – 2014-11-07 (×2): 5 mg via ORAL
  Filled 2014-11-04 (×2): qty 1

## 2014-11-04 MED ORDER — OXYCODONE-ACETAMINOPHEN 5-325 MG PO TABS
2.0000 | ORAL_TABLET | ORAL | Status: DC | PRN
  Administered 2014-11-07: 2 via ORAL
  Filled 2014-11-04: qty 2

## 2014-11-04 MED ORDER — HYDROMORPHONE 0.3 MG/ML IV SOLN
INTRAVENOUS | Status: DC
Start: 2014-11-04 — End: 2014-11-04
  Filled 2014-11-04: qty 25

## 2014-11-04 MED ORDER — MIDAZOLAM HCL 5 MG/5ML IJ SOLN
INTRAMUSCULAR | Status: DC | PRN
  Administered 2014-11-04: 2 mg via INTRAVENOUS

## 2014-11-04 MED ORDER — PROPOFOL 10 MG/ML IV BOLUS
INTRAVENOUS | Status: AC
  Filled 2014-11-04: qty 20

## 2014-11-04 MED ORDER — EPHEDRINE SULFATE 50 MG/ML IJ SOLN
INTRAMUSCULAR | Status: AC
  Filled 2014-11-04: qty 1

## 2014-11-04 MED ORDER — ARTIFICIAL TEARS OP OINT
TOPICAL_OINTMENT | OPHTHALMIC | Status: DC | PRN
  Administered 2014-11-04: 1 via OPHTHALMIC

## 2014-11-04 MED ORDER — POLYETHYLENE GLYCOL 3350 17 G PO PACK
17.0000 g | PACK | Freq: Every day | ORAL | Status: DC
  Filled 2014-11-04: qty 1

## 2014-11-04 MED ORDER — SUCCINYLCHOLINE CHLORIDE 20 MG/ML IJ SOLN
INTRAMUSCULAR | Status: AC
  Filled 2014-11-04: qty 1

## 2014-11-04 MED ORDER — PROGESTERONE MICRONIZED 100 MG PO CAPS
100.0000 mg | ORAL_CAPSULE | Freq: Every day | ORAL | Status: DC
  Administered 2014-11-04 – 2014-11-07 (×4): 100 mg via ORAL
  Filled 2014-11-04 (×4): qty 1

## 2014-11-04 MED ORDER — NEOSTIGMINE METHYLSULFATE 10 MG/10ML IV SOLN
INTRAVENOUS | Status: DC | PRN
  Administered 2014-11-04: 4 mg via INTRAVENOUS

## 2014-11-04 MED ORDER — PHENYLEPHRINE 40 MCG/ML (10ML) SYRINGE FOR IV PUSH (FOR BLOOD PRESSURE SUPPORT)
PREFILLED_SYRINGE | INTRAVENOUS | Status: AC
  Filled 2014-11-04: qty 10

## 2014-11-04 MED ORDER — DOCUSATE SODIUM 100 MG PO CAPS
100.0000 mg | ORAL_CAPSULE | Freq: Two times a day (BID) | ORAL | Status: DC
  Administered 2014-11-04 – 2014-11-07 (×5): 100 mg via ORAL
  Filled 2014-11-04 (×6): qty 1

## 2014-11-04 MED FILL — Heparin Sodium (Porcine) Inj 1000 Unit/ML: INTRAMUSCULAR | Qty: 30 | Status: AC

## 2014-11-04 MED FILL — Sodium Chloride IV Soln 0.9%: INTRAVENOUS | Qty: 1000 | Status: AC

## 2014-11-04 SURGICAL SUPPLY — 79 items
APL SKNCLS STERI-STRIP NONHPOA (GAUZE/BANDAGES/DRESSINGS) ×1
BENZOIN TINCTURE PRP APPL 2/3 (GAUZE/BANDAGES/DRESSINGS) ×3 IMPLANT
BLADE CLIPPER SURG (BLADE) IMPLANT
BUR ACORN 6.0 (BURR) ×2 IMPLANT
BUR ACORN 6.0MM (BURR) ×1
BUR MATCHSTICK NEURO 3.0 LAGG (BURR) ×3 IMPLANT
CANISTER SUCT 3000ML (MISCELLANEOUS) ×3 IMPLANT
CAP REVERE LOCKING (Cap) ×4 IMPLANT
CLAMP CONNECTOR 6.5-6.5MM (Clamp) ×4 IMPLANT
CLOSURE WOUND 1/2 X4 (GAUZE/BANDAGES/DRESSINGS) ×1
CONT SPEC 4OZ CLIKSEAL STRL BL (MISCELLANEOUS) ×5 IMPLANT
COVER BACK TABLE 60X90IN (DRAPES) ×3 IMPLANT
DRAPE C-ARM 42X72 X-RAY (DRAPES) ×6 IMPLANT
DRAPE LAPAROTOMY 100X72X124 (DRAPES) ×3 IMPLANT
DRAPE POUCH INSTRU U-SHP 10X18 (DRAPES) ×3 IMPLANT
DRSG OPSITE 4X5.5 SM (GAUZE/BANDAGES/DRESSINGS) ×2 IMPLANT
DRSG OPSITE POSTOP 4X6 (GAUZE/BANDAGES/DRESSINGS) ×2 IMPLANT
DRSG PAD ABDOMINAL 8X10 ST (GAUZE/BANDAGES/DRESSINGS) IMPLANT
DURAPREP 26ML APPLICATOR (WOUND CARE) ×3 IMPLANT
ELECT BLADE 4.0 EZ CLEAN MEGAD (MISCELLANEOUS) ×3
ELECT REM PT RETURN 9FT ADLT (ELECTROSURGICAL) ×3
ELECTRODE BLDE 4.0 EZ CLN MEGD (MISCELLANEOUS) IMPLANT
ELECTRODE REM PT RTRN 9FT ADLT (ELECTROSURGICAL) ×1 IMPLANT
EVACUATOR 1/8 PVC DRAIN (DRAIN) IMPLANT
EVACUATOR 3/16  PVC DRAIN (DRAIN) ×2
EVACUATOR 3/16 PVC DRAIN (DRAIN) IMPLANT
GAUZE SPONGE 4X4 12PLY STRL (GAUZE/BANDAGES/DRESSINGS) ×3 IMPLANT
GAUZE SPONGE 4X4 16PLY XRAY LF (GAUZE/BANDAGES/DRESSINGS) ×3 IMPLANT
GLOVE BIOGEL M 8.0 STRL (GLOVE) ×1 IMPLANT
GLOVE BIOGEL PI IND STRL 7.5 (GLOVE) IMPLANT
GLOVE BIOGEL PI INDICATOR 7.5 (GLOVE) ×8
GLOVE EXAM NITRILE LRG STRL (GLOVE) IMPLANT
GLOVE EXAM NITRILE MD LF STRL (GLOVE) IMPLANT
GLOVE EXAM NITRILE XL STR (GLOVE) IMPLANT
GLOVE EXAM NITRILE XS STR PU (GLOVE) IMPLANT
GLOVE SS N UNI LF 7.5 STRL (GLOVE) ×8 IMPLANT
GLOVE SS N UNI LF 8.0 STRL (GLOVE) ×6 IMPLANT
GLOVE SURG SS PI 7.0 STRL IVOR (GLOVE) ×4 IMPLANT
GLOVE SURG SS PI 7.5 STRL IVOR (GLOVE) ×2 IMPLANT
GLOVE SURG SS PI 8.0 STRL IVOR (GLOVE) ×4 IMPLANT
GOWN STRL REUS W/ TWL LRG LVL3 (GOWN DISPOSABLE) ×1 IMPLANT
GOWN STRL REUS W/ TWL XL LVL3 (GOWN DISPOSABLE) IMPLANT
GOWN STRL REUS W/TWL 2XL LVL3 (GOWN DISPOSABLE) IMPLANT
GOWN STRL REUS W/TWL LRG LVL3 (GOWN DISPOSABLE) ×3
GOWN STRL REUS W/TWL XL LVL3 (GOWN DISPOSABLE) ×9
IMPLANT RISE 11X17-8MM SPINE (Neuro Prosthesis/Implant) ×4 IMPLANT
KIT BASIN OR (CUSTOM PROCEDURE TRAY) ×3 IMPLANT
KIT INFUSE MEDIUM (Orthopedic Implant) ×2 IMPLANT
KIT ROOM TURNOVER OR (KITS) ×3 IMPLANT
NDL HYPO 18GX1.5 BLUNT FILL (NEEDLE) IMPLANT
NDL HYPO 21X1.5 SAFETY (NEEDLE) IMPLANT
NDL HYPO 25X1 1.5 SAFETY (NEEDLE) IMPLANT
NEEDLE HYPO 18GX1.5 BLUNT FILL (NEEDLE) IMPLANT
NEEDLE HYPO 21X1.5 SAFETY (NEEDLE) ×3 IMPLANT
NEEDLE HYPO 25X1 1.5 SAFETY (NEEDLE) ×3 IMPLANT
NS IRRIG 1000ML POUR BTL (IV SOLUTION) ×3 IMPLANT
PACK LAMINECTOMY NEURO (CUSTOM PROCEDURE TRAY) ×3 IMPLANT
PAD ARMBOARD 7.5X6 YLW CONV (MISCELLANEOUS) ×13 IMPLANT
PATTIES SURGICAL .5 X1 (DISPOSABLE) ×1 IMPLANT
PATTIES SURGICAL .5 X3 (DISPOSABLE) IMPLANT
ROD REVERE 7.5MM (Rod) ×2 IMPLANT
ROD REVERE CURVED 65MM (Rod) ×2 IMPLANT
SCREW REVERE 5.5X45 (Screw) ×4 IMPLANT
SPONGE LAP 4X18 X RAY DECT (DISPOSABLE) IMPLANT
SPONGE NEURO XRAY DETECT 1X3 (DISPOSABLE) IMPLANT
SPONGE SURGIFOAM ABS GEL 100 (HEMOSTASIS) ×3 IMPLANT
STRIP CLOSURE SKIN 1/2X4 (GAUZE/BANDAGES/DRESSINGS) ×2 IMPLANT
SUT VIC AB 1 CT1 18XBRD ANBCTR (SUTURE) ×2 IMPLANT
SUT VIC AB 1 CT1 8-18 (SUTURE) ×6
SUT VIC AB 2-0 CP2 18 (SUTURE) ×3 IMPLANT
SUT VIC AB 3-0 SH 8-18 (SUTURE) ×3 IMPLANT
SYR 20CC LL (SYRINGE) ×2 IMPLANT
SYR 20ML ECCENTRIC (SYRINGE) ×3 IMPLANT
SYR 5ML LL (SYRINGE) IMPLANT
TAPE STRIPS DRAPE STRL (GAUZE/BANDAGES/DRESSINGS) ×2 IMPLANT
TOWEL OR 17X24 6PK STRL BLUE (TOWEL DISPOSABLE) ×3 IMPLANT
TOWEL OR 17X26 10 PK STRL BLUE (TOWEL DISPOSABLE) ×3 IMPLANT
TRAY FOLEY CATH 14FRSI W/METER (CATHETERS) ×3 IMPLANT
WATER STERILE IRR 1000ML POUR (IV SOLUTION) ×3 IMPLANT

## 2014-11-04 NOTE — Plan of Care (Signed)
Problem: Consults Goal: Skin Care Protocol Initiated - if Braden Score 18 or less If consults are not indicated, leave blank or document N/A  Outcome: Completed/Met Date Met:  11/04/14     

## 2014-11-04 NOTE — Progress Notes (Signed)
Patient stood up on the side of the bed with physical therapy.  Since brace is not here, until tomorrow, she will not ambulate until it is brought in.  She tolerated well. Sat and ate her supper on the edge of the bed.

## 2014-11-04 NOTE — Anesthesia Postprocedure Evaluation (Signed)
Anesthesia Post Note  Patient: Crystal Larsen  Procedure(s) Performed: Procedure(s) (LRB): Augmentation to Lumbar three-four with Decompression/Fusion/Pedicle Screws (N/A)  Anesthesia type: GA  Patient location: PACU  Post pain: Pain level controlled  Post assessment: Post-op Vital signs reviewed  Last Vitals:  Filed Vitals:   11/04/14 1220  BP: 107/65  Pulse: 85  Temp: 37.1 C  Resp: 17    Post vital signs: Reviewed  Level of consciousness: sedated  Complications: No apparent anesthesia complications

## 2014-11-04 NOTE — Transfer of Care (Signed)
Immediate Anesthesia Transfer of Care Note  Patient: Crystal Larsen  Procedure(s) Performed: Procedure(s): Augmentation to Lumbar three-four with Decompression/Fusion/Pedicle Screws (N/A)  Patient Location: PACU  Anesthesia Type:General  Level of Consciousness: awake, alert  and oriented  Airway & Oxygen Therapy: Patient Spontanous Breathing and Patient connected to nasal cannula oxygen  Post-op Assessment: Report given to PACU RN, Post -op Vital signs reviewed and stable and Patient moving all extremities X 4  Post vital signs: Reviewed and stable  Complications: No apparent anesthesia complications

## 2014-11-04 NOTE — Anesthesia Preprocedure Evaluation (Addendum)
Anesthesia Evaluation  Patient identified by MRN, date of birth, ID band Patient awake    Reviewed: Allergy & Precautions, H&P , Patient's Chart, lab work & pertinent test results, reviewed documented beta blocker date and time   History of Anesthesia Complications (+) PONV and history of anesthetic complications  Airway Mallampati: II  TM Distance: >3 FB Neck ROM: full    Dental  (+) Teeth Intact, Dental Advisory Given   Pulmonary sleep apnea ,  breath sounds clear to auscultation        Cardiovascular Exercise Tolerance: Good hypertension, Rhythm:regular Rate:Normal     Neuro/Psych  Headaches, Seizures -,  PSYCHIATRIC DISORDERS Depression    GI/Hepatic GERD-  ,  Endo/Other  Hypothyroidism   Renal/GU      Musculoskeletal  (+) Arthritis -,   Abdominal   Peds  Hematology   Anesthesia Other Findings   Reproductive/Obstetrics                            Anesthesia Physical Anesthesia Plan  ASA: II  Anesthesia Plan: General ETT   Post-op Pain Management:    Induction: Intravenous  Airway Management Planned: Oral ETT and Video Laryngoscope Planned  Additional Equipment:   Intra-op Plan:   Post-operative Plan: Extubation in OR  Informed Consent: I have reviewed the patients History and Physical, chart, labs and discussed the procedure including the risks, benefits and alternatives for the proposed anesthesia with the patient or authorized representative who has indicated his/her understanding and acceptance.   Dental Advisory Given  Plan Discussed with: CRNA and Surgeon  Anesthesia Plan Comments:        Anesthesia Quick Evaluation

## 2014-11-04 NOTE — Evaluation (Signed)
Physical Therapy Evaluation Patient Details Name: Crystal Larsen MRN: [id number] DOB: [birth year] Today's Date: 11/04/2014   History of Present Illness  Patient is a 58 y/o female s/p Augmentation to Lumbar three-four with Decompression/Fusion/Pedicle Screws. PMH of HTN, depression, seizures and unsteady gait.    Clinical Impression  Patient presents with generalized weakness and pain during evaluation. Mobility assessment limited due to not having brace in room. Husband to bring back brace tomorrow AM. Pt highly motivated to sit up EOB and start mobilizing. Education provided on back precautions. Pt would benefit from skilled PT to improve transfers, gait, balance and mobility so pt can maximize independence and return to PLOF.    Follow Up Recommendations Home health PT;Supervision - Intermittent    Equipment Recommendations  None recommended by PT    Recommendations for Other Services       Precautions / Restrictions Precautions Precautions: Back Precaution Booklet Issued: Yes (comment) Precaution Comments: Reviewed back precautions.  Required Braces or Orthoses: Spinal Brace Spinal Brace: Lumbar corset;Applied in sitting position Restrictions Weight Bearing Restrictions: No Other Position/Activity Restrictions: Brace not present in room. Husband to bring tomorrow AM by 9 am.      Mobility  Bed Mobility Overal bed mobility: Needs Assistance Bed Mobility: Rolling;Sidelying to Sit Rolling: Supervision Sidelying to sit: Supervision       General bed mobility comments: HOB flat, use of rails. VC's for log roll technique.  Transfers Overall transfer level: Needs assistance Equipment used: Rolling walker (2 wheeled) Transfers: Sit to/from Stand Sit to Stand: Min guard         General transfer comment: Min guard for safety. VC's for hand placement.  Ambulation/Gait Ambulation/Gait assistance:  (Gait deferred due to not having LSO brace in room.)               Stairs            Wheelchair Mobility    Modified Rankin (Stroke Patients Only)       Balance Overall balance assessment: Needs assistance Sitting-balance support: Feet supported;No upper extremity supported Sitting balance-Leahy Scale: Good Sitting balance - Comments: Able to reach outside BoS without difficulty.    Standing balance support: During functional activity;Bilateral upper extremity supported Standing balance-Leahy Scale: Poor Standing balance comment: Requires BUE support during static standing for balance/pain control.                             Pertinent Vitals/Pain Pain Assessment: 0-10 Pain Score: 4  Pain Location: surgical site Pain Descriptors / Indicators: Sore;Aching Pain Intervention(s): Monitored during session;Repositioned    Home Living Family/patient expects to be discharged to:: Private residence Living Arrangements: Spouse/significant other Available Help at Discharge: Family;Available 24 hours/day Type of Home: House Home Access: Stairs to enter Entrance Stairs-Rails: None Entrance Stairs-Number of Steps: 2 Home Layout: One level Home Equipment: Walker - 2 wheels;Bedside commode      Prior Function Level of Independence: Independent               Hand Dominance        Extremity/Trunk Assessment   Upper Extremity Assessment: Defer to OT evaluation;Overall WFL for tasks assessed           Lower Extremity Assessment: Generalized weakness         Communication   Communication: No difficulties  Cognition Arousal/Alertness: Awake/alert Behavior During Therapy: WFL for tasks assessed/performed Overall Cognitive Status: Within Functional Limits for tasks assessed  General Comments      Exercises        Assessment/Plan    PT Assessment Patient needs continued PT services  PT Diagnosis Acute pain;Generalized weakness   PT Problem List Pain;Decreased  strength;Decreased mobility;Decreased balance  PT Treatment Interventions Balance training;Gait training;Stair training;Patient/family education;Functional mobility training;Therapeutic activities;Therapeutic exercise   PT Goals (Current goals can be found in the Care Plan section) Acute Rehab PT Goals Patient Stated Goal: to return to independence PT Goal Formulation: With patient Time For Goal Achievement: 11/18/14 Potential to Achieve Goals: Good    Frequency Min 5X/week   Barriers to discharge        Co-evaluation               End of Session Equipment Utilized During Treatment: Gait belt Activity Tolerance: Patient tolerated treatment well Patient left: in bed;with call bell/phone within reach;with family/visitor present;with nursing/sitter in room (Sitting EOB eating dinner.) Nurse Communication: Mobility status         Time: 3128-1188 PT Time Calculation (min) (ACUTE ONLY): 17 min   Charges:   PT Evaluation $Initial PT Evaluation Tier I: 1 Procedure PT Treatments $Therapeutic Activity: 8-22 mins   PT G CodesCandy Sledge A 11/04/2014, 5:00 PM  Candy Sledge, [place], DPT [phone]

## 2014-11-04 NOTE — Progress Notes (Signed)
Patient arrived from PACU.  She has been settled in the room.  Vital signs and assessment has been completed

## 2014-11-04 NOTE — Plan of Care (Signed)
Problem: Consults Goal: Spinal Surgery Patient Education See Patient Education Module for education specifics. Outcome: Completed/Met Date Met:  11/04/14     

## 2014-11-04 NOTE — Plan of Care (Signed)
Problem: Consults Goal: Diabetes Guidelines if Diabetic/Glucose > 140 If diabetic or lab glucose is > 140 mg/dl - Initiate Diabetes/Hyperglycemia Guidelines & Document Interventions  Outcome: Not Applicable Date Met:  11/04/14     

## 2014-11-04 NOTE — Plan of Care (Signed)
Problem: Phase I Progression Outcomes Goal: Log roll for position change Outcome: Completed/Met Date Met:  11/04/14     

## 2014-11-04 NOTE — Op Note (Signed)
Crystal Larsen, Crystal Larsen NO.:  [id number]  MEDICAL RECORD NO.:  [id number]  LOCATION:  [id number]                        FACILITY:  [facility]  PHYSICIAN:  Leeroy Cha, M.D.   DATE OF BIRTH:  1956/03/02  DATE OF PROCEDURE:  11/04/2014 DATE OF DISCHARGE:                              OPERATIVE REPORT   PREOPERATIVE DIAGNOSIS:  L3-L4 spondylolisthesis with a large herniated disk radiculopathy.  Status post fusion 4-5 and 5-1.  POSTOPERATIVE DIAGNOSIS:  L3-L4 spondylolisthesis with a large herniated disk radiculopathy. Status post fusion 4-5 and 5-1.  PROCEDURE:  Bilateral L3 laminectomy and facetectomy, bilateral L3-L4 diskectomy more than 1 to be able to introduce 2 expandable cages. Lysis of adhesions, decompression of the thecal sac as well as L3 and L4 nerve root.  New set of pedicle screws at the level of L3.  Expand on the fusion from hooks at the level of the L4 rod to the new pedicle screws.  Posterolateral arthrodesis with BMP and autograft.  Cell Saver. C-arm.  SURGEON:  Leeroy Cha, MD  ASSISTANT:  Eustace Moore, MD.  CLINICAL HISTORY:  The patient underwent a fusion of the level 4-5 and 5- 1.  The patient did well.  She went back to normal.  She was going to the gym, lifting weight, doing aerobics, but unfortunately she fell and landed in her gluteus.  Since then, she developed back pain with radiation to both legs with a burning sensation mostly in the quadriceps.  She has failed with conservative treatment.  Myelogram showed that she has stenosis at level L3-L4 with spondylolisthesis and a large herniated disk at that level.  The patient has failed with conservative treatment and surgery was advised.  DESCRIPTION OF PROCEDURE:  The patient was taken to the OR, and after intubation, she was positioned on prone manner.  The skin was prepped with Betadine and DuraPrep.  Drapes were applied.  Then, midline incision using the upper part of the previous  one was made all the way up to the level of L3-L2.  Muscle was retracted laterally.  At the level of the upper fusion, the patient had quite a bit of overgrowth of bone secondary to the previous fusion and it took [place] at least half an hour to dissect the area to be able to see the crosslink as well as the upper part of the rod and the screws.  From then on, the thick yellow ligament was removed and the thecal sac was fully decompressed.  Lysis of adhesion to clean the L3 and L4 nerve root was achieved.  From then on, we retracted the thecal sac, first in the right side and then in the left side and we entered the disk space.  Total gross diskectomy laterally was achieved.  The diskectomy was more than normal to be able to introduce 2 cages expandable that were introduced and expanded all the way up to 40 mm height.  The cage had a BMP autograft inside.  Then, using the C-arm first in AP view and then in lateral view, we made 2 holes in the pedicle of L3.  Prior to insertion of the pedicle screws, we feel the hole just  to be sure that we were surrounded by bone in all 4 quadrants.  Two screws of 5.5 x 45 were inserted.  Then, after we removed the crosslink, we cleaned the rods bilaterally and we were able to introduce a hook for extension of the new fusion.  Then, 2 new hooks medially from the upper part of the L4 fusion to the new L3 pedicle screw was introduced and kept in place with caps.  AP and lateral showed good position of the new screws and the rods.  From then on, we went laterally and we removed the periosteum of L3-L4 and a mix of BMP and autograft was used for arthrodesis.  The area was irrigated.  Valsalva maneuver was negative.  Prior to closure, I went back and took a look of the foramen just to be sure that we were surrounded by bone.  Valsalva maneuver was negative up to 45.  From then on, Hemovac was left in the epidural space.  The wound was closed with several layers of  Vicryl and the skin with Steri-Strips.          ______________________________ Leeroy Cha, M.D.     EB/MEDQ  D:  11/04/2014  T:  11/04/2014  Job:  [id number]

## 2014-11-04 NOTE — Anesthesia Procedure Notes (Signed)
Procedure Name: Intubation Date/Time: 11/04/2014 8:42 AM Performed by: Carola Frost Pre-anesthesia Checklist: Patient identified, Timeout performed, Emergency Drugs available, Suction available and Patient being monitored Patient Re-evaluated:Patient Re-evaluated prior to inductionOxygen Delivery Method: Circle system utilized Preoxygenation: Pre-oxygenation with 100% oxygen Intubation Type: IV induction Ventilation: Mask ventilation without difficulty and Oral airway inserted - appropriate to patient size Grade View: Grade I Tube type: Oral Tube size: 7.0 mm Number of attempts: 1 Airway Equipment and Method: Stylet and Video-laryngoscopy Placement Confirmation: CO2 detector,  positive ETCO2,  ETT inserted through vocal cords under direct vision and breath sounds checked- equal and bilateral Secured at: 21 cm Tube secured with: Tape Dental Injury: Teeth and Oropharynx as per pre-operative assessment  Comments: Previous surgery intubated with glidescope, attempts x2.  Successful intubation with glidescope. DLX1 - grade 1 view. Easy mask.  Atraumatic placement with confirmation of tube placement.

## 2014-11-05 MED ORDER — DESVENLAFAXINE SUCCINATE ER 50 MG PO TB24
50.0000 mg | ORAL_TABLET | Freq: Every day | ORAL | Status: DC
  Administered 2014-11-05 – 2014-11-07 (×3): 50 mg via ORAL
  Filled 2014-11-05 (×3): qty 1

## 2014-11-05 MED ORDER — SIMETHICONE 80 MG PO CHEW
80.0000 mg | CHEWABLE_TABLET | Freq: Four times a day (QID) | ORAL | Status: DC | PRN
  Administered 2014-11-05 – 2014-11-06 (×3): 80 mg via ORAL
  Filled 2014-11-05 (×3): qty 1

## 2014-11-05 MED ORDER — BISACODYL 5 MG PO TBEC: 5.0000 mg | DELAYED_RELEASE_TABLET | Freq: Every day | ORAL | Status: DC | PRN

## 2014-11-05 NOTE — Progress Notes (Signed)
Physical Therapy Treatment Patient Details Name: Crystal Larsen MRN: [id number] DOB: [birth year] Today's Date: 11/05/2014    History of Present Illness Patient is a 58 y/o female s/p Augmentation to Lumbar three-four with Decompression/Fusion/Pedicle Screws. PMH of HTN, depression, seizures and unsteady gait.    PT Comments    Patient progressing well with mobility. Tolerated ambulation with drop in Sa02 on RA to high 80s. Pain in abdomen limiting mobility today. Pt able to recall 2/3 back precautions but requires reminders at times to avoid twisting. Will need to assess ability to negotiate steps next session. Will continue to follow acutely.   Follow Up Recommendations  Home health PT;Supervision - Intermittent     Equipment Recommendations  None recommended by PT    Recommendations for Other Services       Precautions / Restrictions Precautions Precautions: Back Precaution Booklet Issued: Yes (comment) Precaution Comments: Able to verbalize 2/3 back precautions. Required Braces or Orthoses: Spinal Brace Spinal Brace: Lumbar corset;Applied in sitting position Restrictions Weight Bearing Restrictions: No    Mobility  Bed Mobility Overal bed mobility: Needs Assistance Bed Mobility: Rolling;Sidelying to Sit Rolling: Supervision Sidelying to sit: Min assist       General bed mobility comments: HOB flat, no rails to simulate home environment. VC's for log roll technique. Reaching for husband's support however cues to perform independently. Min A to elevate trunk.  Transfers Overall transfer level: Needs assistance Equipment used: Rolling walker (2 wheeled) Transfers: Sit to/from Stand Sit to Stand: Min guard         General transfer comment: Min guard for safety. VC's for hand placement as pt with tendency to pull up on RW. Stood from [employer] x2; transferred to chair x1.  Ambulation/Gait Ambulation/Gait assistance: Min guard Ambulation Distance (Feet): 150  Feet Assistive device: Rolling walker (2 wheeled) Gait Pattern/deviations: Step-through pattern;Decreased stride length   Gait velocity interpretation: Below normal speed for age/gender General Gait Details: Pt with slow, mildly unsteady gait. No overt LOB. Sa02 decreased to high 80s however able to resolve quickly with cues for pursed lip breathing.   Stairs            Wheelchair Mobility    Modified Rankin (Stroke Patients Only)       Balance Overall balance assessment: Needs assistance Sitting-balance support: Feet supported;No upper extremity supported Sitting balance-Leahy Scale: Good Sitting balance - Comments: Able to donn brace with Min A for positioning.    Standing balance support: During functional activity Standing balance-Leahy Scale: Fair Standing balance comment: Able to perform static standing without UE support, requires BUE support during gait for balance/safety.                    Cognition Arousal/Alertness: Awake/alert Behavior During Therapy: WFL for tasks assessed/performed Overall Cognitive Status: Within Functional Limits for tasks assessed       Memory: Decreased recall of precautions              Exercises      General Comments General comments (skin integrity, edema, etc.): Encouraged pt to sit up in chair for all meals and change positions frequently. Took some time to get pt comfortable in recliner chair.      Pertinent Vitals/Pain Pain Assessment: 0-10 Pain Score: 5  Pain Location: back and abdomen(gas) Pain Descriptors / Indicators: Sore;Aching Pain Intervention(s): Limited activity within patient's tolerance;Monitored during session;Repositioned    Home Living  Prior Function            PT Goals (current goals can now be found in the care plan section) Progress towards PT goals: Progressing toward goals    Frequency  Min 5X/week    PT Plan Current plan remains appropriate     Co-evaluation             End of Session Equipment Utilized During Treatment: Gait belt Activity Tolerance: Patient tolerated treatment well Patient left: in chair;with call bell/phone within reach;with family/visitor present (Instructed pt to ask for assist back to bed for safety.)     Time: 0920-0950 PT Time Calculation (min) (ACUTE ONLY): 30 min  Charges:  $Gait Training: 8-22 mins $Therapeutic Activity: 8-22 mins                    G CodesCandy Sledge A 11/07/14, 10:06 AM Candy Sledge, PT, DPT [phone]

## 2014-11-05 NOTE — Progress Notes (Signed)
Occupational Therapy Evaluation Patient Details Name: Crystal Larsen MRN: [id number] DOB: 1956-08-22 Today's Date: 11/05/2014    History of Present Illness Patient is a 58 y/o female s/p Augmentation to Lumbar three-four with Decompression/Fusion/Pedicle Screws. PMH of HTN, depression, seizures and unsteady gait.   Clinical Impression   Patient is s/p above surgery resulting in functional limitations due to the deficits listed below (see OT problem list). Patient with abdominal pain limiting participation today. Patient will benefit from skilled OT acutely to increase independence and safety with ADLS to allow discharge home.    Follow Up Recommendations  No OT follow up;Supervision/Assistance - 24 hour    Equipment Recommendations  None recommended by OT    Recommendations for Other Services       Precautions / Restrictions Precautions Precautions: Back Precaution Booklet Issued: Yes (comment) Precaution Comments: Able to verbalize 3/3 precautions Required Braces or Orthoses: Spinal Brace Spinal Brace: Lumbar corset;Applied in sitting position Restrictions Weight Bearing Restrictions: No      Mobility Bed Mobility Overal bed mobility: Needs Assistance Bed Mobility: Rolling;Sit to Sidelying Rolling: Min guard Sidelying to sit: Min assist     Sit to sidelying: Min guard General bed mobility comments: HOB flat; no bedrails to simulate home environment. Verbal cues for log roll technique.  Transfers Overall transfer level: Needs assistance Equipment used: Rolling walker (2 wheeled) Transfers: Sit to/from Stand Sit to Stand: Min guard         General transfer comment: Min guard for safety. Verbal cues for hand placement as pt with tendency to pull up on RW.    Balance Overall balance assessment: Needs assistance Sitting-balance support: No upper extremity supported;Feet supported Sitting balance-Leahy Scale: Good Sitting balance - Comments: Able to complete R  and L lateral lean while therapist removed gown from underneath her   Standing balance support: Bilateral upper extremity supported;During functional activity Standing balance-Leahy Scale: Fair Standing balance comment: Able to perform static standing without UE support, requires BUE support during gait for balance/safety.                            ADL Overall ADL's : Needs assistance/impaired     Grooming: Wash/dry hands;Wash/dry face;Oral care;Min guard;Cueing for safety;Standing           Upper Body Dressing : Supervision/safety;Sitting Upper Body Dressing Details (indicate cue type and reason): Pt able to doff back brace while seated EOB.     Toilet Transfer: Min guard;Cueing for safety;Ambulation;BSC;RW Toilet Transfer Details (indicate cue type and reason): Min guard (A) for safety and balance. Verbal cues for RW positioning         Functional mobility during ADLs: Min guard;Rolling walker General ADL Comments: Pt with good adherence to back precautions during ADLs and transfers. Min verbal cues for RW positioning and hand placement. Pt already using compensatory technique for oral care (2 cups) from previous surgery.      Vision                     Perception     Praxis      Pertinent Vitals/Pain Pain Assessment: 0-10 Pain Score: 5  Pain Location: abdominal pain (gas) Pain Descriptors / Indicators: Discomfort Pain Intervention(s): Limited activity within patient's tolerance;Monitored during session;Repositioned     Hand Dominance Right   Extremity/Trunk Assessment Upper Extremity Assessment Upper Extremity Assessment: Generalized weakness   Lower Extremity Assessment Lower Extremity Assessment: Defer to PT evaluation  Cervical / Trunk Assessment Cervical / Trunk Assessment: Normal   Communication Communication Communication: No difficulties   Cognition Arousal/Alertness: Awake/alert Behavior During Therapy: WFL for tasks  assessed/performed Overall Cognitive Status: Within Functional Limits for tasks assessed       Memory: Decreased recall of precautions             General Comments       Exercises       Shoulder Instructions      Home Living Family/patient expects to be discharged to:: Private residence Living Arrangements: Spouse/significant other Available Help at Discharge: Family;Available 24 hours/day Type of Home: House Home Access: Stairs to enter [employer] of Steps: 2 Entrance Stairs-Rails: None Home Layout: One level     Bathroom Shower/Tub: Walk-in shower;Door   [employer] Toilet: Standard     Home Equipment: Environmental consultant - 2 wheels;Bedside commode;Hand held shower head          Prior Functioning/Environment Level of Independence: Independent        Comments: Was using RW to get in and out of bed    OT Diagnosis: Generalized weakness;Acute pain   OT Problem List: Decreased range of motion;Decreased strength;Decreased activity tolerance;Impaired balance (sitting and/or standing);Decreased coordination;Decreased safety awareness;Decreased knowledge of use of DME or AE;Decreased knowledge of precautions;Pain   OT Treatment/Interventions: Self-care/ADL training;Therapeutic exercise;DME and/or AE instruction;Therapeutic activities;Balance training;Patient/family education    OT Goals(Current goals can be found in the care plan section) Acute Rehab OT Goals Patient Stated Goal: to return to independence OT Goal Formulation: With patient Time For Goal Achievement: 11/19/14 Potential to Achieve Goals: Good ADL Goals Pt Will Perform Upper Body Bathing: with modified independence;sitting Pt Will Perform Lower Body Bathing: with modified independence;sitting/lateral leans Pt Will Perform Lower Body Dressing: with modified independence;with adaptive equipment;sit to/from stand Pt Will Transfer to Toilet: with modified independence;ambulating;bedside commode Pt Will  Perform Toileting - Clothing Manipulation and hygiene: with modified independence;sit to/from stand  OT Frequency: Min 2X/week   Barriers to D/C:            Co-evaluation              End of Session Equipment Utilized During Treatment: Gait belt;Rolling walker;Back brace Nurse Communication: Mobility status;Precautions  Activity Tolerance: Patient tolerated treatment well Patient left: in bed;with call bell/phone within reach;with bed alarm set;with nursing/sitter in room   Time: 1638-4536 OT Time Calculation (min): 24 min Charges:    G-Codes:    Redmond Baseman 11-15-2014, 11:29 AM

## 2014-11-05 NOTE — Plan of Care (Signed)
Problem: Phase I Progression Outcomes Goal: Pain controlled with appropriate interventions Outcome: Progressing     

## 2014-11-05 NOTE — Progress Notes (Signed)
Patient ID: Crystal Larsen, female   DOB: [birth year], 58 y.o.   MRN: [id number] Doing well. Ambulating. No weakness. C/o flatus

## 2014-11-06 NOTE — Plan of Care (Signed)
Problem: Phase I Progression Outcomes Goal: OOB as tolerated unless otherwise ordered Outcome: Progressing     

## 2014-11-06 NOTE — Progress Notes (Signed)
Wasted 33 cc of fentanyl PCA with Roselyn Reef, RN in sink.  Angeline Slim I 11/06/2014 5:32 PM

## 2014-11-06 NOTE — Progress Notes (Addendum)
Occupational Therapy Treatment Patient Details Name: Crystal Larsen MRN: [id number] DOB: February 28, 1956 Today's Date: 11/06/2014    History of present illness Patient is a 58 y/o female s/p Augmentation to Lumbar three-four with Decompression/Fusion/Pedicle Screws. PMH of HTN, depression, seizures and unsteady gait.   OT comments  Pt. Progressing well.  Able to complete shower stall transfer, toileting, LB dressing all while demonstrating back precautions and at S level of assist.  Eager for d/c home.  Goals met for acute OT.  Will notify evaluating therapist of D/C.   Follow Up Recommendations       Equipment Recommendations       Recommendations for Other Services      Precautions / Restrictions Precautions Precautions: Back Precaution Comments: Able to verbalize 3/3 precautions Required Braces or Orthoses: Spinal Brace Spinal Brace: Lumbar corset;Applied in sitting position       Mobility Bed Mobility               General bed mobility comments: Patient up in recliner before and after session  Transfers Overall transfer level: Needs assistance Equipment used: Rolling walker (2 wheeled) Transfers: Sit to/from [facility] Sit to Stand: Supervision         General transfer comment: Supervision for safety. No cues needed    Balance                                   ADL Overall ADL's : Needs assistance/impaired             Lower Body Bathing: Supervison/ safety;Sitting/lateral leans Lower Body Bathing Details (indicate cue type and reason): pt. able to cross L/R leg over knee, no A/E needs Upper Body Dressing : Supervision/safety;Sitting Upper Body Dressing Details (indicate cue type and reason): able to don brace with set up in sitting Lower Body Dressing: Supervision/safety;Sitting/lateral leans;Sit to/from stand Lower Body Dressing Details (indicate cue type and reason): able to cross L/R leg over knee to don/doff sock  and underwear, no A/E needs Toilet Transfer: Supervision/safety;Ambulation;RW;Regular Toilet;Grab bars   Toileting- Clothing Manipulation and Hygiene: Supervision/safety;Sitting/lateral lean;Sit to/from stand;Adhering to back precautions   Tub/ Shower Transfer: Min guard;Ambulation;Anterior/posterior;3 in 1   Functional mobility during ADLs: Supervision/safety;Rolling walker General ADL Comments: eager for d/c home, demonstrates safety techniques and adhering to back precautions.        Vision                     Perception     Praxis      Cognition   Behavior During Therapy: WFL for tasks assessed/performed Overall Cognitive Status: Within Functional Limits for tasks assessed                       Extremity/Trunk Assessment               Exercises     Shoulder Instructions       General Comments      Pertinent Vitals/ Pain       Pain Assessment: 0-10 Pain Score: 4  Pain Location: back Pain Descriptors / Indicators: Discomfort Pain Intervention(s): Repositioned;Monitored during session  Home Living                                          Prior Functioning/Environment  Frequency       Progress Toward Goals  OT Goals(current goals can now be found in the care plan section)  Progress towards OT goals: Goals met/education completed, patient discharged from [place] of Session     Activity Tolerance     Patient Left     Nurse Communication  reviewed with RN, pts. Eagerness for d/c home, OT goals met and D/C from OT        Time: [phone] OT Time Calculation (min): 13 min  Charges: OT General Charges $OT Visit: 1 Procedure OT Treatments $Self Care/Home Management : 8-22 mins  Janice Coffin, COTA/L 11/06/2014, 10:04 AM

## 2014-11-06 NOTE — Progress Notes (Signed)
Patient ID: Crystal Larsen, female   DOB: October 13, 1956, 58 y.o.   MRN: [id number] Doing well. Ambulating. No weakness. Discharge over the weekend

## 2014-11-06 NOTE — Progress Notes (Signed)
Physical Therapy Treatment Patient Details Name: Crystal Larsen MRN: [id number] DOB: [birth year] Today's Date: 11/06/2014    History of Present Illness Patient is a 58 y/o female s/p Augmentation to Lumbar three-four with Decompression/Fusion/Pedicle Screws. PMH of HTN, depression, seizures and unsteady gait.    PT Comments    Patient is progressing well with ambulation and was able to complete stair training this AM. Patient is hopeful that she can go home today but has not spoken with MD. Encouraged to ambulate with husband on IV pole has been disconnected. RN made aware. Patient safe to D/C from a mobility standpoint based on progression towards goals set on PT eval.    Follow Up Recommendations  Home health PT;Supervision - Intermittent     Equipment Recommendations  None recommended by PT    Recommendations for Other Services       Precautions / Restrictions Precautions Precautions: Back Precaution Comments: Able to verbalize 3/3 precautions Required Braces or Orthoses: Spinal Brace Spinal Brace: Lumbar corset;Applied in sitting position    Mobility  Bed Mobility               General bed mobility comments: Patient up in recliner before and after session  Transfers Overall transfer level: Needs assistance Equipment used: Rolling walker (2 wheeled)   Sit to Stand: Supervision         General transfer comment: Supervision for safety. No cues needed  Ambulation/Gait Ambulation/Gait assistance: Supervision Ambulation Distance (Feet): 250 Feet Assistive device: Rolling walker (2 wheeled) Gait Pattern/deviations: Step-through pattern;Decreased stride length     General Gait Details: Patient with good use of RW.    Stairs Stairs: Yes Stairs assistance: Min assist Stair Management: Step to pattern;Forwards;No rails Number of Stairs: 2 General stair comments: HHA on L. Cues for technique. Husband will assist at home.   Wheelchair Mobility     Modified Rankin (Stroke Patients Only)       Balance                                    Cognition Arousal/Alertness: Awake/alert Behavior During Therapy: WFL for tasks assessed/performed Overall Cognitive Status: Within Functional Limits for tasks assessed                      Exercises      General Comments        Pertinent Vitals/Pain Pain Score: 3  Pain Location: abdominal pain Pain Descriptors / Indicators: Discomfort Pain Intervention(s): Monitored during session    Home Living                      Prior Function            PT Goals (current goals can now be found in the care plan section) Progress towards PT goals: Progressing toward goals    Frequency  Min 5X/week    PT Plan Current plan remains appropriate    Co-evaluation             End of Session Equipment Utilized During Treatment: Gait belt Activity Tolerance: Patient tolerated treatment well Patient left: in chair;with call bell/phone within reach     Time: 0803-0828 PT Time Calculation (min) (ACUTE ONLY): 25 min  Charges:  $Gait Training: 8-22 mins $Therapeutic Activity: 8-22 mins  G Codes:      Jacqualyn Posey 11/06/2014, 8:33 AM  11/06/2014 Jacqualyn Posey PTA [phone] pager [phone] office

## 2014-11-06 NOTE — Plan of Care (Signed)
Problem: Phase I Progression Outcomes Goal: Hemodynamically stable Outcome: Completed/Met Date Met:  11/06/14

## 2014-11-06 NOTE — Plan of Care (Signed)
Problem: Phase I Progression Outcomes Goal: Pain controlled with appropriate interventions Outcome: Progressing     

## 2014-11-07 MED ORDER — OXYCODONE-ACETAMINOPHEN 5-325 MG PO TABS: 2.0000 | ORAL_TABLET | ORAL | Status: DC | PRN

## 2014-11-07 MED ORDER — DIAZEPAM 5 MG PO TABS: 5.0000 mg | ORAL_TABLET | Freq: Four times a day (QID) | ORAL | Status: DC | PRN

## 2014-11-07 NOTE — Progress Notes (Signed)
Physical Therapy Discharge Patient Details Name: [name] MRN: [id number] DOB: 09-22-1956 Today's Date: 11/07/2014 Time: 7530-0511 PT Time Calculation (min) (ACUTE ONLY): 28 min  Patient discharged from PT services secondary to goals met and no further PT needs identified.  Please see latest therapy progress note for current level of functioning and progress toward goals.    Progress and discharge plan discussed with patient and/or caregiver: Patient/Caregiver agrees with plan Patient has all needed equipment. Anticipates will have family assistance by husband and dau who is home for holidays.  Church members will assist with meals. Patient safe to ambulate with RW modified independently, needs physical assistance only for stair negoitation.  Patient may benefit from therapy on next venue of care to address balance, strength and activity tolerance, to which patient agreed.  Divide, PT [phone] [place] 11/07/2014, 9:38 AM

## 2014-11-07 NOTE — Progress Notes (Signed)
Pt transported out of unit per wheelchair with belongings and DME by 2 nurse techs. No acute distress noted.   Angeline Slim I 11/07/2014 1:45 PM

## 2014-11-07 NOTE — Progress Notes (Signed)
Discharge instructions and prescriptions given to pt. Pt verbally acknowledged understanding. Pt  Voiced no questions when prompted. Pt to be transported per car by family member. Pt to be transported to lobby per wheelchair by nurse tech after pt gets dressed.  Will monitor   Crystal Larsen I 11/07/2014 12:56 PM

## 2014-11-07 NOTE — Discharge Summary (Signed)
Physician Discharge Summary  Patient ID: PAULLA Larsen MRN: [id number] DOB/AGE: 03/05/1956 58 y.o.  Admit date: 11/04/2014 Discharge date: 11/07/2014  Admission Diagnoses: Lumbar Spondylosis  Discharge Diagnoses: Same Active Problems:   Spondylolisthesis of lumbar region   Discharged Condition: Stable  Hospital Course:  Crystal Larsen is a 58 y.o. female who was electively admitted after lumbar fusion. Her postoperative course was unremarkable. At the time of discharge, she was ambulating, voiding normally, tolerating diet, and pain was controlled with oral analgesics.  Treatments: Surgery - L3-4 lumbar fusion  Discharge Exam: Blood pressure 97/62, pulse 86, temperature 99.2 F (37.3 C), temperature source Oral, resp. rate 18, height 5\' 7"  (1.702 m), weight 86.728 kg (191 lb 3.2 oz), SpO2 98 %. Awake, alert, oriented Speech fluent, appropriate CN grossly intact 5/5 BUE/BLE Wound c/d/i  Follow-up: Follow-up in Dr. Harley Hallmark office [facility] [phone]) in 2-3 weeks  Disposition: 01-Home or Self Care     Medication List    STOP taking these medications        aspirin 81 MG tablet      TAKE these medications        celecoxib 200 MG capsule  Commonly known as:  CELEBREX  Take 400 mg by mouth daily.     desvenlafaxine 50 MG 24 hr tablet  Commonly known as:  PRISTIQ  Take 1 tablet (50 mg total) by mouth daily.     diazepam 5 MG tablet  Commonly known as:  VALIUM  Take 1 tablet (5 mg total) by mouth every 6 (six) hours as needed for muscle spasms.     dicyclomine 20 MG tablet  Commonly known as:  BENTYL  Take 20 mg by mouth 3 (three) times daily as needed for spasms.     divalproex 250 MG 24 hr tablet  Commonly known as:  DEPAKOTE ER  Take 1 tablet (250 mg total) by mouth daily.     Fish Oil 1000 MG Caps  Take 3 capsules by mouth daily.     levothyroxine 175 MCG tablet  Commonly known as:  SYNTHROID, LEVOTHROID  Take  175 mcg by mouth daily before breakfast.     lisinopril 40 MG tablet  Commonly known as:  PRINIVIL,ZESTRIL  Take 40 mg by mouth daily.     loratadine 10 MG tablet  Commonly known as:  CLARITIN  Take 10 mg by mouth daily as needed for allergies.     LYRICA 50 MG capsule  Generic drug:  pregabalin  Take 50 mg by mouth daily.     Melatonin 5 MG Tabs  Take 5 mg by mouth at bedtime.     methylphenidate 10 MG tablet  Commonly known as:  RITALIN  Take 1 tablet (10 mg total) by mouth 2 (two) times daily.     MINIVELLE 0.0375 MG/24HR  Generic drug:  estradiol  Place 1 patch onto the skin 2 (two) times a week. Thursday AND Sunday     multivitamin capsule  Take 1 capsule by mouth daily.     niacin 500 MG CR tablet  Commonly known as:  NIASPAN  Take 500 mg by mouth at bedtime.     oxyCODONE-acetaminophen 5-325 MG per tablet  Commonly known as:  PERCOCET/ROXICET  Take 2 tablets by mouth every 4 (four) hours as needed for moderate pain.     pantoprazole 40 MG tablet  Commonly known as:  PROTONIX  Take 40 mg by mouth daily.  progesterone 100 MG capsule  Commonly known as:  PROMETRIUM  Take 100 mg by mouth daily.     triamterene-hydrochlorothiazide 37.5-25 MG per tablet  Commonly known as:  MAXZIDE-25  Take 0.5 tablets by mouth daily.         SignedConsuella Lose, C 11/07/2014, 7:27 AM

## 2015-01-04 ENCOUNTER — Telehealth: Payer: Self-pay | Admitting: *Deleted

## 2015-01-04 NOTE — Telephone Encounter (Signed)
Called patient to reschedule appointment To 12 pm

## 2015-01-04 NOTE — Telephone Encounter (Signed)
rescheduled the appointment to 330 wed

## 2015-01-05 ENCOUNTER — Ambulatory Visit: Payer: 59 | Admitting: Adult Health

## 2015-01-06 ENCOUNTER — Ambulatory Visit (INDEPENDENT_AMBULATORY_CARE_PROVIDER_SITE_OTHER): Payer: 59 | Admitting: Adult Health

## 2015-01-06 ENCOUNTER — Encounter: Payer: Self-pay | Admitting: Adult Health

## 2015-01-06 VITALS — BP 113/80 | HR 72 | Ht 68.0 in | Wt 186.0 lb

## 2015-01-06 DIAGNOSIS — F0781 Postconcussional syndrome: Secondary | ICD-10-CM

## 2015-01-06 DIAGNOSIS — R413 Other amnesia: Secondary | ICD-10-CM

## 2015-01-06 DIAGNOSIS — G43009 Migraine without aura, not intractable, without status migrainosus: Secondary | ICD-10-CM

## 2015-01-06 NOTE — Progress Notes (Signed)
PATIENT: Crystal Larsen DOB: 1956/09/19  REASON FOR VISIT: follow up HISTORY FROM: patient  HISTORY OF PRESENT ILLNESS: Crystal Larsen is a 59 year old female with a history of post concussive syndrome. She returns today for follow-up. She is currently taking Prestiq for depression. She was started on Ritalin to help with her attentiveness. She reports that she really didn't notice the benefit. She did not have any refills so she is no longer taking it. She states that she has trouble remember things. She will think conversations take place that doesn't but after a couple of days she will recall it. Or she might forget what day something happened but the next day she can recall it. She is concerned about alzheimer's disease because it does run in her family.  She is also on Depakote for migraines and her dose was recently decreased. She continues to not have any migraines.    HISTORY 09/30/14 [facility]): Crystal Larsen is a 58 y.o. female  Is seen here as a revisit  from Dr. Nolon Rod for her annual visit , post concussion syndrome. Crystal Larsen was last seen in our office in November 2013 by Darleen Crocker, she had originally been a patient of Dr. Laveda Abbe 20 years ago and later followed with Dr. Shaune Pollack. Dr. Laveda Abbe had started the patient on fracture or for the treatment of dizziness after concussion. She remained having occasional headaches and balance issues.  Effexor also caused severe withdrawal symptoms , when not taken on a regular basis. These are described as electric shock sensations, like  a "cattle prod "and are frequently felt I man and within is a skipping  doses or even several doses of Effexor.   The patient change to Pristiq 50 mg daily.  She felt good,  she denied any depression- sometime she noticed a milder stinging sensation not nearly the degree that she had suffered under been taking Effexor. Discontinued the Pristiq , but needed to return after she had family stressors,  grief and loss reaction earlier this year.   Christmas eve in 2011 she had a vision, a visual hallucination, thought to be a  migraine aura. She continues to take Depakote Bid.    09-30-14 Interval history;   The patient has done well on Pristiq and her postconcussion syndrome is healed, She has a good appetite,  Her sleep is unchanged , she has some balance problems.   Headaches have resolved.  Last Migraine over a year ago. With visual aura.   MOCA 29-30.  Trail making failed.   she gets lost on the way to this office, she forgets words and names and still misplaces objects.  She has forgotten doctors appointments, easily distractable, multitasking is difficult.   She read a book " Still Alice " and she has seen herself in this story of a 3 some year old woman suffering from Alzheimer's.   A positive dementia family history is also her concern, she retired 6 years ago and has not been involved in many projects she wanted to get involved in.   Crossword puzzles have been fun for her.     Decreased  Depakote  To once a day for tremor. Decreased pristiq to 50 mg daily from 100mg .   REVIEW OF SYSTEMS: Out of a complete 14 system review of symptoms, the patient complains only of the following symptoms, and all other reviewed systems are negative.  Memory loss, snoring  ALLERGIES: Allergies  Allergen Reactions  . Tramadol  Other (See Comments)    seizures  . Gabapentin Itching  . Adhesive [Tape] Rash    bandaide  . Erythromycin Rash  . Latex Rash  . Sulfonamide Derivatives Rash    HOME MEDICATIONS: Outpatient Prescriptions Prior to Visit  Medication Sig Dispense Refill  . celecoxib (CELEBREX) 200 MG capsule Take 400 mg by mouth daily.     [name] desvenlafaxine (PRISTIQ) 50 MG 24 hr tablet Take 1 tablet (50 mg total) by mouth daily. 90 tablet 3  . dicyclomine (BENTYL) 20 MG tablet Take 20 mg by mouth 3 (three) times daily as needed for spasms.    . divalproex (DEPAKOTE ER) 250 MG 24  hr tablet Take 1 tablet (250 mg total) by mouth daily. 90 tablet 3  . levothyroxine (SYNTHROID, LEVOTHROID) 175 MCG tablet Take 175 mcg by mouth daily before breakfast.    . lisinopril (PRINIVIL,ZESTRIL) 40 MG tablet Take 40 mg by mouth daily.      [name] loratadine (CLARITIN) 10 MG tablet Take 10 mg by mouth daily as needed for allergies.    [name] LYRICA 50 MG capsule Take 50 mg by mouth daily.     . Melatonin 5 MG TABS Take 5 mg by mouth at bedtime.    [name] MINIVELLE 0.0375 MG/24HR Place 1 patch onto the skin 2 (two) times a week. Thursday AND Sunday    . Multiple Vitamin (MULTIVITAMIN) capsule Take 1 capsule by mouth daily.      . niacin (NIASPAN) 500 MG CR tablet Take 500 mg by mouth at bedtime.    . Omega-3 Fatty Acids (FISH OIL) 1000 MG CAPS Take 3 capsules by mouth daily.      . pantoprazole (PROTONIX) 40 MG tablet Take 40 mg by mouth daily.      . progesterone (PROMETRIUM) 100 MG capsule Take 100 mg by mouth daily.    [name] triamterene-hydrochlorothiazide (MAXZIDE-25) 37.5-25 MG per tablet Take 0.5 tablets by mouth daily.     . diazepam (VALIUM) 5 MG tablet Take 1 tablet (5 mg total) by mouth every 6 (six) hours as needed for muscle spasms. (Patient not taking: Reported on 01/06/2015) 30 tablet 0  . methylphenidate (RITALIN) 10 MG tablet Take 1 tablet (10 mg total) by mouth 2 (two) times daily. (Patient not taking: Reported on 01/06/2015) 60 tablet 0  . oxyCODONE-acetaminophen (PERCOCET/ROXICET) 5-325 MG per tablet Take 2 tablets by mouth every 4 (four) hours as needed for moderate pain. (Patient not taking: Reported on 01/06/2015) 60 tablet 0   No facility-administered medications prior to visit.    PAST MEDICAL HISTORY: Past Medical History  Diagnosis Date  . GERD (gastroesophageal reflux disease)   . Gallstones   . HTN (hypertension)   . Hypothyroidism   . IBS (irritable bowel syndrome)     ?  . Obesity   . Sleep apnea     does not use cpap  . Unsteady gait   . Neuropathy     right leg  .  PONV (postoperative nausea and vomiting) 1981 X 1    only time  . Migraine     "maybe once/yr" (11/04/2014)  . Seizures 2003     due to tramadol reaction -- takes depakote for migraine prevention  . Arthritis     "left foot; left hand; right shoulder; back" (11/04/2014)  . History of gout   . Depression     PAST SURGICAL HISTORY: Past Surgical History  Procedure Laterality Date  . Nasal sinus surgery  1980's  .  Ankle ganglion cyst excision Right 1980's  . Cesarean section  1986  . Bunionectomy Left 2012  . Shoulder arthroscopy w/ rotator cuff repair Right 2012  . Back surgery    . Posterior lumbar fusion  05/2013    L4-5; S1  . Posterior lumbar fusion  11/04/2014    L3-4  . Laparoscopic cholecystectomy  1990's  . Tubal ligation  1992  . Eye surgery Bilateral 2012    "lasered a hole in my iris cause pressure was building up"    FAMILY HISTORY: Family History  Problem Relation Age of Onset  . Heart disease Maternal Grandfather     PGF  . Alcohol abuse Maternal Grandfather   . Hyperlipidemia Maternal Grandfather   . Anxiety disorder Sister   . Asthma Mother   . Lung cancer Mother   . Fibromyalgia Sister   . Nephrolithiasis Daughter   . Heart disease Father          PHYSICAL EXAM  Filed Vitals:   01/06/15 1530  BP: 113/80  Pulse: 72  Height: 5\' 8"  (1.727 m)  Weight: 186 lb (84.369 kg)   Body mass index is 28.29 kg/(m^2).  Generalized: Well developed, in no acute distress   Neurological examination  Mentation: Alert oriented to time, place, history taking. Follows all commands speech and language fluent. MOCA 28/30 Cranial nerve II-XII: Pupils were equal round reactive to light. Extraocular movements were full, visual field were full on confrontational test. Facial sensation and strength were normal. Uvula tongue midline. Head turning and shoulder shrug  were normal and symmetric. Motor: The motor testing reveals 5 over 5 strength of all 4 extremities. Good  symmetric motor tone is noted throughout.  Sensory: Sensory testing is intact to soft touch on all 4 extremities. No evidence of extinction is noted.  Coordination: Cerebellar testing reveals good finger-nose-finger and heel-to-shin bilaterally.  Gait and station: Gait is normal. Tandem gait is normal. Romberg is negative. No drift is seen.  Reflexes: Deep tendon reflexes are symmetric and normal bilaterally.    DIAGNOSTIC DATA (LABS, IMAGING, TESTING) - I reviewed patient records, labs, notes, testing and imaging myself where available.  Lab Results  Component Value Date   WBC 4.5 10/27/2014   HGB 13.7 10/27/2014   HCT 40.6 10/27/2014   MCV 85.1 10/27/2014   PLT 234 10/27/2014      Component Value Date/Time   NA 139 10/27/2014 1033   K 4.3 10/27/2014 1033   CL 102 10/27/2014 1033   CO2 24 10/27/2014 1033   GLUCOSE 92 10/27/2014 1033   BUN 17 10/27/2014 1033   CREATININE 0.85 10/27/2014 1033   CALCIUM 9.8 10/27/2014 1033   PROT 7.7 09/07/2011 1044   ALBUMIN 3.5 09/07/2011 1044   AST 29 09/07/2011 1044   ALT 22 09/07/2011 1044   ALKPHOS 80 09/07/2011 1044   BILITOT 0.5 09/07/2011 1044   GFRNONAA 74* 10/27/2014 1033   GFRAA 28* 10/27/2014 1033      ASSESSMENT AND PLAN 59 y.o. year old female  has a past medical history of GERD (gastroesophageal reflux disease); Gallstones; HTN (hypertension); Hypothyroidism; IBS (irritable bowel syndrome); Obesity; Sleep apnea; Unsteady gait; Neuropathy; PONV (postoperative nausea and vomiting) (1981 X 1); Migraine; Seizures (2003); Arthritis; History of gout; and Depression. here with:  1. Hx of post concussive syndrome 2. Memory disturbance 3. Migraines  Overall patient is doing well. She continues to have issues remembering things but usually can recall it later. Her MOCA is 28/30- consistent with  previous score. We will continue to monitor. Ritalin was not benificial- this will be D/C. She continues to not experience any migraines and  it has been this way for several years. I will D/C Depakote. If her migraines return she will let us know. F/U in 6 months or sooner if needed.    Ward Givens, MSN, NP-C 01/06/2015, 3:33 PM [facility] [address], [place], [place] [address] [phone]  Note: This document was prepared with digital dictation and possible smart phrase technology. Any transcriptional errors that result from this process are unintentional.

## 2015-01-06 NOTE — Patient Instructions (Signed)
Stop Depakote. Continue Pristiq.  If symptoms worsen or you develop new symptoms please let us know.

## 2015-01-07 ENCOUNTER — Encounter: Payer: Self-pay | Admitting: Adult Health

## 2015-01-07 NOTE — Progress Notes (Signed)
I agree with the assessment and plan as directed by NP .The patient is known to me .   Alanya Vukelich, MD  

## 2015-02-01 ENCOUNTER — Other Ambulatory Visit: Payer: Self-pay | Admitting: Family Medicine

## 2015-02-03 ENCOUNTER — Emergency Department (HOSPITAL_COMMUNITY)
Admission: EM | Admit: 2015-02-03 | Discharge: 2015-02-03 | Disposition: A | Discharge status: Home / Self Care | Payer: 59 | Attending: Emergency Medicine | Admitting: Emergency Medicine

## 2015-02-03 ENCOUNTER — Encounter (HOSPITAL_COMMUNITY): Payer: Self-pay | Admitting: Emergency Medicine

## 2015-02-03 ENCOUNTER — Emergency Department (HOSPITAL_COMMUNITY): Payer: 59

## 2015-02-03 DIAGNOSIS — F329 Major depressive disorder, single episode, unspecified: Secondary | ICD-10-CM | POA: Insufficient documentation

## 2015-02-03 DIAGNOSIS — S299XXA Unspecified injury of thorax, initial encounter: Secondary | ICD-10-CM | POA: Diagnosis not present

## 2015-02-03 DIAGNOSIS — E669 Obesity, unspecified: Secondary | ICD-10-CM | POA: Insufficient documentation

## 2015-02-03 DIAGNOSIS — K219 Gastro-esophageal reflux disease without esophagitis: Secondary | ICD-10-CM | POA: Insufficient documentation

## 2015-02-03 DIAGNOSIS — M546 Pain in thoracic spine: Secondary | ICD-10-CM

## 2015-02-03 DIAGNOSIS — Z79899 Other long term (current) drug therapy: Secondary | ICD-10-CM | POA: Diagnosis not present

## 2015-02-03 DIAGNOSIS — Y998 Other external cause status: Secondary | ICD-10-CM | POA: Insufficient documentation

## 2015-02-03 DIAGNOSIS — E039 Hypothyroidism, unspecified: Secondary | ICD-10-CM | POA: Insufficient documentation

## 2015-02-03 DIAGNOSIS — Z791 Long term (current) use of non-steroidal anti-inflammatories (NSAID): Secondary | ICD-10-CM | POA: Insufficient documentation

## 2015-02-03 DIAGNOSIS — Y9289 Other specified places as the place of occurrence of the external cause: Secondary | ICD-10-CM | POA: Insufficient documentation

## 2015-02-03 DIAGNOSIS — Y9301 Activity, walking, marching and hiking: Secondary | ICD-10-CM | POA: Insufficient documentation

## 2015-02-03 DIAGNOSIS — W1830XA Fall on same level, unspecified, initial encounter: Secondary | ICD-10-CM | POA: Diagnosis not present

## 2015-02-03 DIAGNOSIS — Y9389 Activity, other specified: Secondary | ICD-10-CM | POA: Insufficient documentation

## 2015-02-03 DIAGNOSIS — I10 Essential (primary) hypertension: Secondary | ICD-10-CM | POA: Insufficient documentation

## 2015-02-03 DIAGNOSIS — G43909 Migraine, unspecified, not intractable, without status migrainosus: Secondary | ICD-10-CM | POA: Diagnosis not present

## 2015-02-03 DIAGNOSIS — Z91040 Latex allergy status: Secondary | ICD-10-CM | POA: Diagnosis not present

## 2015-02-03 NOTE — Discharge Instructions (Signed)
Back Exercises Back exercises help treat and prevent back injuries. The goal of back exercises is to increase the strength of your abdominal and back muscles and the flexibility of your back. These exercises should be started when you no longer have back pain. Back exercises include:  Pelvic Tilt. Lie on your back with your knees bent. Tilt your pelvis until the lower part of your back is against the floor. Hold this position 5 to 10 sec and repeat 5 to 10 times.  Knee to Chest. Pull first 1 knee up against your chest and hold for 20 to 30 seconds, repeat this with the other knee, and then both knees. This may be done with the other leg straight or bent, whichever feels better.  Sit-Ups or Curl-Ups. Bend your knees 90 degrees. Start with tilting your pelvis, and do a partial, slow sit-up, lifting your trunk only 30 to 45 degrees off the floor. Take at least 2 to 3 seconds for each sit-up. Do not do sit-ups with your knees out straight. If partial sit-ups are difficult, simply do the above but with only tightening your abdominal muscles and holding it as directed.  Hip-Lift. Lie on your back with your knees flexed 90 degrees. Push down with your feet and shoulders as you raise your hips a couple inches off the floor; hold for 10 seconds, repeat 5 to 10 times.  Back arches. Lie on your stomach, propping yourself up on bent elbows. Slowly press on your hands, causing an arch in your low back. Repeat 3 to 5 times. Any initial stiffness and discomfort should lessen with repetition over time.  Shoulder-Lifts. Lie face down with arms beside your body. Keep hips and torso pressed to floor as you slowly lift your head and shoulders off the floor. Do not overdo your exercises, especially in the beginning. Exercises may cause you some mild back discomfort which lasts for a few minutes; however, if the pain is more severe, or lasts for more than 15 minutes, do not continue exercises until you see your caregiver.  Improvement with exercise therapy for back problems is slow.  See your caregivers for assistance with developing a proper back exercise program. Document Released: 12/21/2004 Document Revised: 02/05/2012 Document Reviewed: 09/14/2011 [facility] Patient Information 2015 [place], [place]. This information is not intended to replace advice given to you by your health care provider. Make sure you discuss any questions you have with your health care provider.   Please contact your surgeon and inform him of your visit today. Please ask him to review all documents related to your visit today. Please use Ibuprofen and ice as needed for back discomfort. If symptoms worsen or you experience new symptoms please return for further evaluation.

## 2015-02-03 NOTE — ED Notes (Signed)
Pt transported to DG at present time.

## 2015-02-03 NOTE — ED Provider Notes (Signed)
CSN: [id number]     Arrival date & time 02/03/15  1150 History   First MD Initiated Contact with Patient 02/03/15 1222     Chief Complaint  Patient presents with  . Back Pain  . Fall   HPI Comments: 59 YOF with a history L3-S1 spinal fusion three months ago fell this morning landing on her buttocks. The fall was caused by her dog running into her. She states that she felt mild lower thoracic upper lumbar pain. She states that the pain is dull with no radiation of symptoms. She states the pain is made worse with back extension. She denies numbness, tingling, weakness, or difficulty ambulating. Denies no changes in bowel or bladder functioning. Pt states that she was concerned because of her recent back surgery; she spoke with her spine surgeons office and they recommend she have x-ray films. She attempted getting them at urgent care but they stated they do not have a radiologist to read them.    Patient is a 59 y.o. female presenting with back pain and fall.  Back Pain Fall    Past Medical History  Diagnosis Date  . GERD (gastroesophageal reflux disease)   . Gallstones   . HTN (hypertension)   . Hypothyroidism   . IBS (irritable bowel syndrome)     ?  . Obesity   . Sleep apnea     does not use cpap  . Unsteady gait   . Neuropathy     right leg  . PONV (postoperative nausea and vomiting) 1981 X 1    only time  . Migraine     "maybe once/yr" (11/04/2014)  . Seizures 2003     due to tramadol reaction -- takes depakote for migraine prevention  . Arthritis     "left foot; left hand; right shoulder; back" (11/04/2014)  . History of gout   . Depression    Past Surgical History  Procedure Laterality Date  . Nasal sinus surgery  1980's  . Ankle ganglion cyst excision Right 1980's  . Cesarean section  1986  . Bunionectomy Left 2012  . Shoulder arthroscopy w/ rotator cuff repair Right 2012  . Back surgery    . Posterior lumbar fusion  05/2013    L4-5; S1  . Posterior lumbar fusion   11/04/2014    L3-4  . Laparoscopic cholecystectomy  1990's  . Tubal ligation  1992  . Eye surgery Bilateral 2012    "lasered a hole in my iris cause pressure was building up"   Family History  Problem Relation Age of Onset  . Heart disease Maternal Grandfather     PGF  . Alcohol abuse Maternal Grandfather   . Hyperlipidemia Maternal Grandfather   . Anxiety disorder Sister   . Asthma Mother   . Lung cancer Mother   . Fibromyalgia Sister   . Nephrolithiasis Daughter   . Heart disease Father    History  Substance Use Topics  . Smoking status: Never Smoker   . Smokeless tobacco: Never Used  . Alcohol Use: 1.2 oz/week    2 Glasses of wine per week   OB History    No data available     Review of Systems  Musculoskeletal: Positive for back pain.  All other systems reviewed and are negative.   Allergies  Tramadol; Gabapentin; Adhesive; Erythromycin; Latex; and Sulfonamide derivatives  Home Medications   Prior to Admission medications   Medication Sig Start Date End Date Taking? Authorizing Provider  celecoxib (CELEBREX) 200  MG capsule Take 400 mg by mouth daily.     Historical Provider, MD  desvenlafaxine (PRISTIQ) 50 MG 24 hr tablet Take 1 tablet (50 mg total) by mouth daily. 09/30/14   Asencion Partridge Dohmeier, MD  dicyclomine (BENTYL) 20 MG tablet Take 20 mg by mouth 3 (three) times daily as needed for spasms.    Historical Provider, MD  divalproex (DEPAKOTE ER) 250 MG 24 hr tablet Take 1 tablet (250 mg total) by mouth daily. 09/30/14   Asencion Partridge Dohmeier, MD  levothyroxine (SYNTHROID, LEVOTHROID) 175 MCG tablet Take 175 mcg by mouth daily before breakfast.    Historical Provider, MD  lisinopril (PRINIVIL,ZESTRIL) 40 MG tablet Take 40 mg by mouth daily.      Historical Provider, MD  loratadine (CLARITIN) 10 MG tablet Take 10 mg by mouth daily as needed for allergies.    Historical Provider, MD  LYRICA 50 MG capsule Take 50 mg by mouth daily.  09/14/14   Historical Provider, MD   Melatonin 5 MG TABS Take 5 mg by mouth at bedtime.    Historical Provider, MD  MINIVELLE 0.0375 MG/24HR Place 1 patch onto the skin 2 (two) times a week. Thursday AND Sunday 09/23/14   Historical Provider, MD  Multiple Vitamin (MULTIVITAMIN) capsule Take 1 capsule by mouth daily.      Historical Provider, MD  niacin (NIASPAN) 500 MG CR tablet Take 500 mg by mouth at bedtime.    Historical Provider, MD  Omega-3 Fatty Acids (FISH OIL) 1000 MG CAPS Take 3 capsules by mouth daily.      Historical Provider, MD  pantoprazole (PROTONIX) 40 MG tablet Take 40 mg by mouth daily.      Historical Provider, MD  progesterone (PROMETRIUM) 100 MG capsule Take 100 mg by mouth daily.    Historical Provider, MD  triamterene-hydrochlorothiazide (MAXZIDE-25) 37.5-25 MG per tablet Take 0.5 tablets by mouth daily.  09/27/11   Historical Provider, MD   BP 150/100 mmHg  Pulse 81  Temp(Src) 97.8 F (36.6 C) (Oral)  Resp 18  SpO2 100% Physical Exam  Constitutional: She is oriented to person, place, and time. She appears well-developed and well-nourished.  HENT:  Head: Normocephalic and atraumatic.  Eyes: Conjunctivae are normal. Pupils are equal, round, and reactive to light. Right eye exhibits no discharge. Left eye exhibits no discharge. No scleral icterus.  Neck: Normal range of motion. No JVD present. No tracheal deviation present.  Pulmonary/Chest: Effort normal. No stridor.  Musculoskeletal:       Thoracic back: She exhibits tenderness. She exhibits normal range of motion, no bony tenderness, no swelling, no edema, no deformity, no laceration, no pain, no spasm and normal pulse.       Lumbar back: She exhibits normal range of motion, no tenderness, no bony tenderness, no swelling, no edema, no deformity, no laceration, no pain and no spasm.  Neurological: She is alert and oriented to person, place, and time. She has normal strength and normal reflexes. She displays no atrophy and no tremor. No sensory deficit.  She exhibits normal muscle tone. She displays no seizure activity. Coordination and gait normal.  Psychiatric: She has a normal mood and affect. Her behavior is normal. Judgment and thought content normal.  Nursing note and vitals reviewed.   ED Course  Procedures (including critical care time) Labs Review Labs Reviewed - No data to display  Imaging Review Dg Thoracic Spine 2 View  02/03/2015   CLINICAL DATA:  Fall, interscapular back pain without radiation. Initial encounter.  EXAM: THORACIC SPINE - 2 VIEW  COMPARISON:  Chest radiograph 06/20/2013.  FINDINGS: Alignment is anatomic. Vertebral body height is maintained. Mild endplate degenerative changes in the upper/mid thoracic spine. Cervicothoracic junction is grossly in anatomic alignment. Degenerative changes are incidentally imaged in the lower cervical spine.  IMPRESSION: Thoracic spondylosis without acute finding.   Electronically Signed   By: Lorin Picket M.D.   On: 02/03/2015 13:12   Dg Lumbar Spine Complete  02/03/2015   CLINICAL DATA:  Fall with low lumbar back pain. No radiation. Initial encounter.  EXAM: LUMBAR SPINE - COMPLETE 4+ VIEW  COMPARISON:  12/21/2014.  FINDINGS: Postoperative changes of L3-S1 posterior lumbar interbody fusion without complicating feature or change from the prior exam. The appearance of screws extending beyond anterior margin of S1 is likely projectional. Vertebral body height is maintained in the lower thoracic and upper lumbar spine. No degenerative changes.  IMPRESSION: Postoperative changes of L3-S1 PLIF without acute finding.   Electronically Signed   By: Lorin Picket M.D.   On: 02/03/2015 13:14     EKG Interpretation None      MDM   Final diagnoses:  Midline thoracic back pain   Pt's main concern for today's visit was making sure her hardware was not affected by today's fall. She reports only minor back pain and denied needing medication for pain control. Films showed not acute changes  from previous films. Pt was encouraged to follow-up with surgeon. She was also counseled to seek further care if new or worsening symptoms present. Pt has no concerns at time of evaluation and was able to ambulate without pain.     Okey Regal, PA-C 02/03/15 [place], MD 02/04/15 [phone]

## 2015-02-03 NOTE — ED Notes (Signed)
Pt reports was walking dog two hours ago resulting to fall. Pt reports L3/L4 back surgery 3 months ago and per protocol surgeon request DG to confirm no injury.

## 2015-05-26 ENCOUNTER — Ambulatory Visit: Payer: 59 | Admitting: Adult Health

## 2015-07-07 ENCOUNTER — Ambulatory Visit (INDEPENDENT_AMBULATORY_CARE_PROVIDER_SITE_OTHER): Payer: Commercial Managed Care - HMO | Admitting: Adult Health

## 2015-07-07 ENCOUNTER — Encounter: Payer: Self-pay | Admitting: Adult Health

## 2015-07-07 VITALS — BP 101/72 | HR 81 | Ht 67.0 in | Wt 179.0 lb

## 2015-07-07 DIAGNOSIS — R413 Other amnesia: Secondary | ICD-10-CM | POA: Diagnosis not present

## 2015-07-07 DIAGNOSIS — F0781 Postconcussional syndrome: Secondary | ICD-10-CM

## 2015-07-07 DIAGNOSIS — G43809 Other migraine, not intractable, without status migrainosus: Secondary | ICD-10-CM | POA: Diagnosis not present

## 2015-07-07 NOTE — Progress Notes (Signed)
I have read the note, and I agree with the clinical assessment and plan.  Crystal Larsen,Crystal Larsen   

## 2015-07-07 NOTE — Patient Instructions (Addendum)
Your memory score has remained stable. Discontinue Depakote. Let me know if your headaches return. If your symptoms worsen or you develop new symptoms please let us know.

## 2015-07-07 NOTE — Progress Notes (Signed)
I agree with the assessment and plan as directed by NP .The patient is known to me .   Kalayla Shadden, MD  

## 2015-07-07 NOTE — Progress Notes (Signed)
PATIENT: Crystal Larsen DOB: [birth year]  REASON FOR VISIT: follow up -postconcussive syndrome HISTORY FROM: patient  HISTORY OF PRESENT ILLNESS: Crystal Larsen is a 59 year old female with a history of postconcussive syndrome. She was referred by Dr. Nolon Rod. She returns today for follow-up. The patient was taking Depakote for migraine- after the last visit she decreased her dose by half- taking 250 mg daily. The patient reports that she has not had any migraine headaches. She continues to notice some changes in her memory. She has trouble remembering conversations but can recall it later on. Denies having to give up anything due to her memory. She is able to complete all ADLs independently. She operates a Teacher, music without difficulty. Denies any new neurological symptoms. She returns today for an evaluation  HISTORY 01/06/15: Crystal Larsen is a 59 year old female with a history of post concussive syndrome. She returns today for follow-up. She is currently taking Prestiq for depression. She was started on Ritalin to help with her attentiveness. She reports that she really didn't notice the benefit. She did not have any refills so she is no longer taking it. She states that she has trouble remember things. She will think conversations take place that doesn't but after a couple of days she will recall it. Or she might forget what day something happened but the next day she can recall it. She is concerned about alzheimer's disease because it does run in her family. She is also on Depakote for migraines and her dose was recently decreased. She continues to not have any migraines.   REVIEW OF SYSTEMS: Out of a complete 14 system review of symptoms, the patient complains only of the following symptoms, and all other reviewed systems are negative.  snoring  ALLERGIES: Allergies  Allergen Reactions  . Tramadol Other (See Comments)    seizures  . Gabapentin Itching  . Adhesive [Tape] Rash   bandaide  . Erythromycin Rash  . Latex Rash  . Sulfonamide Derivatives Rash    HOME MEDICATIONS: Outpatient Prescriptions Prior to Visit  Medication Sig Dispense Refill  . celecoxib (CELEBREX) 200 MG capsule Take 400 mg by mouth daily.     [name] desvenlafaxine (PRISTIQ) 50 MG 24 hr tablet Take 1 tablet (50 mg total) by mouth daily. 90 tablet 3  . dicyclomine (BENTYL) 20 MG tablet Take 20 mg by mouth 3 (three) times daily as needed for spasms.    . divalproex (DEPAKOTE ER) 250 MG 24 hr tablet Take 1 tablet (250 mg total) by mouth daily. 90 tablet 3  . levothyroxine (SYNTHROID, LEVOTHROID) 175 MCG tablet Take 175 mcg by mouth daily before breakfast.    . lisinopril (PRINIVIL,ZESTRIL) 40 MG tablet Take 40 mg by mouth daily.      [name] loratadine (CLARITIN) 10 MG tablet Take 10 mg by mouth daily as needed for allergies.    [name] LYRICA 50 MG capsule Take 50 mg by mouth daily.     . Melatonin 5 MG TABS Take 5 mg by mouth at bedtime.    [name] MINIVELLE 0.0375 MG/24HR Place 1 patch onto the skin 2 (two) times a week. Thursday AND Sunday    . Multiple Vitamin (MULTIVITAMIN) capsule Take 1 capsule by mouth daily.      . niacin (NIASPAN) 500 MG CR tablet Take 500 mg by mouth at bedtime.    . Omega-3 Fatty Acids (FISH OIL) 1000 MG CAPS Take 3 capsules by mouth daily.      [name]  pantoprazole (PROTONIX) 40 MG tablet Take 40 mg by mouth daily.      . progesterone (PROMETRIUM) 100 MG capsule Take 100 mg by mouth daily.    [name] triamterene-hydrochlorothiazide (MAXZIDE-25) 37.5-25 MG per tablet Take 0.5 tablets by mouth daily.      No facility-administered medications prior to visit.    PAST MEDICAL HISTORY: Past Medical History  Diagnosis Date  . GERD (gastroesophageal reflux disease)   . Gallstones   . HTN (hypertension)   . Hypothyroidism   . IBS (irritable bowel syndrome)     ?  . Obesity   . Sleep apnea     does not use cpap  . Unsteady gait   . Neuropathy     right leg  . PONV (postoperative nausea and  vomiting) 1981 X 1    only time  . Migraine     "maybe once/yr" (11/04/2014)  . Seizures 2003     due to tramadol reaction -- takes depakote for migraine prevention  . Arthritis     "left foot; left hand; right shoulder; back" (11/04/2014)  . History of gout   . Depression     PAST SURGICAL HISTORY: Past Surgical History  Procedure Laterality Date  . Nasal sinus surgery  1980's  . Ankle ganglion cyst excision Right 1980's  . Cesarean section  1986  . Bunionectomy Left 2012  . Shoulder arthroscopy w/ rotator cuff repair Right 2012  . Back surgery    . Posterior lumbar fusion  05/2013    L4-5; S1  . Posterior lumbar fusion  11/04/2014    L3-4  . Laparoscopic cholecystectomy  1990's  . Tubal ligation  1992  . Eye surgery Bilateral 2012    "lasered a hole in my iris cause pressure was building up"    FAMILY HISTORY: Family History  Problem Relation Age of Onset  . Heart disease Maternal Grandfather     PGF  . Alcohol abuse Maternal Grandfather   . Hyperlipidemia Maternal Grandfather   . Anxiety disorder Sister   . Asthma Mother   . Lung cancer Mother   . Fibromyalgia Sister   . Nephrolithiasis Daughter   . Heart disease Father     SOCIAL HISTORY: Social History   Social History  . Marital Status: Married    Spouse Name: N/A  . Number of Children: 2  . Years of Education: N/A   Occupational History  . retired    Social History Main Topics  . Smoking status: Never Smoker   . Smokeless tobacco: Never Used  . Alcohol Use: 1.2 oz/week    2 Glasses of wine per week  . Drug Use: No  . Sexual Activity: Not Currently   Other Topics Concern  . Not on file   Social History Narrative   Right handed. Caffeine 3 cups coffee daily, BS, Married, 2 kids.       PHYSICAL EXAM  Filed Vitals:   07/07/15 1502  BP: 101/72  Pulse: 81  Height: 5\' 7"  (1.702 m)  Weight: 179 lb (81.194 kg)   Body mass index is 28.03 kg/(m^2).   Montreal Cognitive Assessment   07/07/2015  Visuospatial/ Executive (0/5) 5  Naming (0/3) 3  Attention: Read list of digits (0/2) 2  Attention: Read list of letters (0/1) 1  Attention: Serial 7 subtraction starting at 100 (0/3) 2  Language: Repeat phrase (0/2) 2  Language : Fluency (0/1) 1  Abstraction (0/2) 2  Delayed Recall (0/5) 4  Orientation (0/6)  6  Total 28     Generalized: Well developed, in no acute distress   Neurological examination  Mentation: Alert oriented to time, place, history taking. Follows all commands speech and language fluent. MOCA 28/30 Cranial nerve II-XII: Pupils were equal round reactive to light. Extraocular movements were full, visual field were full on confrontational test. Facial sensation and strength were normal. Uvula tongue midline. Head turning and shoulder shrug  were normal and symmetric. Motor: The motor testing reveals 5 over 5 strength of all 4 extremities. Good symmetric motor tone is noted throughout.  Sensory: Sensory testing is intact to soft touch on all 4 extremities. No evidence of extinction is noted.  Coordination: Cerebellar testing reveals good finger-nose-finger and heel-to-shin bilaterally.  Gait and station: Gait is normal. Tandem gait is normal. Romberg is negative. No drift is seen.  Reflexes: Deep tendon reflexes are symmetric and normal bilaterally.   DIAGNOSTIC DATA (LABS, IMAGING, TESTING) - I reviewed patient records, labs, notes, testing and imaging myself where available.      ASSESSMENT AND PLAN 59 y.o. year old female  has a past medical history of GERD (gastroesophageal reflux disease); Gallstones; HTN (hypertension); Hypothyroidism; IBS (irritable bowel syndrome); Obesity; Sleep apnea; Unsteady gait; Neuropathy; PONV (postoperative nausea and vomiting) (1981 X 1); Migraine; Seizures (2003); Arthritis; History of gout; and Depression. here with:  1. History of Postconcussive syndrome 2. Memory disturbance 3. Migraines   Overall the patient is  doing well. She continues to not have any migraine headaches. She can discontinue Depakote. Advised to call and let us know if her headaches return. Her memory score is stable. Her MOCA today is 28/30 was previously 28/30. Patient advised that if her symptoms worsen or she develops new symptoms she should let us know. She will follow-up in one year or sooner if needed.  i    Ward Givens, MSN, NP-C 07/07/2015, 2:51 PM [facility] [address], [place], [place] [address] [phone]  Note: This document was prepared with digital dictation and possible smart phrase technology. Any transcriptional errors that result from this process are unintentional.

## 2015-08-30 ENCOUNTER — Other Ambulatory Visit: Payer: Self-pay | Admitting: Family Medicine

## 2015-08-30 DIAGNOSIS — N183 Chronic kidney disease, stage 3 unspecified: Secondary | ICD-10-CM

## 2015-09-03 ENCOUNTER — Ambulatory Visit
Admission: RE | Admit: 2015-09-03 | Discharge: 2015-09-03 | Disposition: A | Discharge status: Home / Self Care | Payer: 59 | Source: Ambulatory Visit | Attending: Family Medicine | Admitting: Family Medicine

## 2015-09-03 DIAGNOSIS — N183 Chronic kidney disease, stage 3 unspecified: Secondary | ICD-10-CM

## 2015-09-22 ENCOUNTER — Other Ambulatory Visit: Payer: Self-pay | Admitting: Neurology

## 2016-02-15 ENCOUNTER — Other Ambulatory Visit: Payer: Self-pay | Admitting: Gastroenterology

## 2016-02-15 DIAGNOSIS — R1013 Epigastric pain: Secondary | ICD-10-CM

## 2016-02-23 ENCOUNTER — Ambulatory Visit
Admission: RE | Admit: 2016-02-23 | Discharge: 2016-02-23 | Disposition: A | Discharge status: Home / Self Care | Payer: Commercial Managed Care - HMO | Source: Ambulatory Visit | Attending: Gastroenterology | Admitting: Gastroenterology

## 2016-02-23 DIAGNOSIS — R1013 Epigastric pain: Secondary | ICD-10-CM

## 2016-02-23 MED ORDER — GADOBENATE DIMEGLUMINE 529 MG/ML IV SOLN
17.0000 mL | Freq: Once | INTRAVENOUS | Status: AC | PRN
  Administered 2016-02-23: 17 mL via INTRAVENOUS

## 2016-03-23 ENCOUNTER — Other Ambulatory Visit: Payer: Self-pay | Admitting: Neurology

## 2016-04-24 ENCOUNTER — Emergency Department (HOSPITAL_COMMUNITY)
Admission: EM | Admit: 2016-04-24 | Discharge: 2016-04-25 | Disposition: A | Discharge status: Home / Self Care | Payer: Commercial Managed Care - HMO | Attending: Emergency Medicine | Admitting: Emergency Medicine

## 2016-04-24 ENCOUNTER — Emergency Department (HOSPITAL_COMMUNITY): Payer: Commercial Managed Care - HMO

## 2016-04-24 ENCOUNTER — Encounter (HOSPITAL_COMMUNITY): Payer: Self-pay | Admitting: *Deleted

## 2016-04-24 DIAGNOSIS — Z91040 Latex allergy status: Secondary | ICD-10-CM | POA: Diagnosis not present

## 2016-04-24 DIAGNOSIS — M545 Low back pain, unspecified: Secondary | ICD-10-CM

## 2016-04-24 DIAGNOSIS — F329 Major depressive disorder, single episode, unspecified: Secondary | ICD-10-CM | POA: Insufficient documentation

## 2016-04-24 DIAGNOSIS — E039 Hypothyroidism, unspecified: Secondary | ICD-10-CM | POA: Diagnosis not present

## 2016-04-24 DIAGNOSIS — Z79891 Long term (current) use of opiate analgesic: Secondary | ICD-10-CM | POA: Diagnosis not present

## 2016-04-24 DIAGNOSIS — I10 Essential (primary) hypertension: Secondary | ICD-10-CM | POA: Diagnosis not present

## 2016-04-24 DIAGNOSIS — Z79899 Other long term (current) drug therapy: Secondary | ICD-10-CM | POA: Diagnosis not present

## 2016-04-24 MED ORDER — DIAZEPAM 2 MG PO TABS
2.0000 mg | ORAL_TABLET | Freq: Once | ORAL | Status: AC
  Administered 2016-04-24: 2 mg via ORAL
  Filled 2016-04-24: qty 1

## 2016-04-24 MED ORDER — OXYCODONE-ACETAMINOPHEN 5-325 MG PO TABS
1.0000 | ORAL_TABLET | Freq: Once | ORAL | Status: AC
  Administered 2016-04-24: 1 via ORAL
  Filled 2016-04-24: qty 1

## 2016-04-24 NOTE — ED Provider Notes (Signed)
CSN: [id number]     Arrival date & time 04/24/16  2136 History   First MD Initiated Contact with Patient 04/24/16 2238     Chief Complaint  Patient presents with  . Back Pain     (Consider location/radiation/quality/duration/timing/severity/associated sxs/prior Treatment) HPI   Patient is a 60 year old female past medical history of reflux, hypertension, arthritis of back pain, onset one week. Patient reports she started having right lower back pain 1 week ago after cleaning her bathtub at home. She reports having constant cramping tightness to her right lower back . She notes the pain worsened over the week and began to present in her right anterior thigh, resulting in her seen her PCP 4 days ago. She states she was given muscle relaxant and pain meds. Patient reports initially having mild relief with medications but notes over the weekend her pain worsen. She states she began having the same cramping tightness in her right lower back which began to radiate around to her right lower abdomen/side and into her right anterior thigh. Patient reports pain is worse with movement. Patient denies any recent fall. Pt denies fever, N/V/D, numbness, tingling, saddle anesthesia, urinary retention, dysuria, hematuria, loss of bowel or bladder, weakness, IVDU, cancer or recent spinal manipulation. Pt reports hx of L3-S1 spinal fusion.  Past Medical History  Diagnosis Date  . GERD (gastroesophageal reflux disease)   . Gallstones   . HTN (hypertension)   . Hypothyroidism   . IBS (irritable bowel syndrome)     ?  . Obesity   . Sleep apnea     does not use cpap  . Unsteady gait   . Neuropathy (HCC)     right leg  . PONV (postoperative nausea and vomiting) 1981 X 1    only time  . Migraine     "maybe once/yr" (11/04/2014)  . Seizures ([place]) 2003     due to tramadol reaction -- takes depakote for migraine prevention  . Arthritis     "left foot; left hand; right shoulder; back" (11/04/2014)  . History  of gout   . Depression    Past Surgical History  Procedure Laterality Date  . Nasal sinus surgery  1980's  . Ankle ganglion cyst excision Right 1980's  . Cesarean section  1986  . Bunionectomy Left 2012  . Shoulder arthroscopy w/ rotator cuff repair Right 2012  . Back surgery    . Posterior lumbar fusion  05/2013    L4-5; S1  . Posterior lumbar fusion  11/04/2014    L3-4  . Laparoscopic cholecystectomy  1990's  . Tubal ligation  1992  . Eye surgery Bilateral 2012    "lasered a hole in my iris cause pressure was building up"   Family History  Problem Relation Age of Onset  . Heart disease Maternal Grandfather     PGF  . Alcohol abuse Maternal Grandfather   . Hyperlipidemia Maternal Grandfather   . Anxiety disorder Sister   . Asthma Mother   . Lung cancer Mother   . Fibromyalgia Sister   . Nephrolithiasis Daughter   . Heart disease Father    Social History  Substance Use Topics  . Smoking status: Never Smoker   . Smokeless tobacco: Never Used  . Alcohol Use: No   OB History    No data available     Review of Systems  Gastrointestinal: Positive for abdominal pain.  Musculoskeletal: Positive for myalgias (righ thigh pain) and back pain.  All other systems reviewed and  are negative.     Allergies  Tramadol; Gabapentin; Adhesive; Erythromycin; Latex; and Sulfonamide derivatives  Home Medications   Prior to Admission medications   Medication Sig Start Date End Date Taking? Authorizing Provider  dicyclomine (BENTYL) 20 MG tablet Take 20 mg by mouth 3 (three) times daily as needed for spasms.   Yes Historical Provider, MD  divalproex (DEPAKOTE ER) 250 MG 24 hr tablet Take 1 tablet (250 mg total) by mouth daily. 09/30/14  Yes Carmen Dohmeier, MD  levothyroxine (SYNTHROID, LEVOTHROID) 175 MCG tablet Take 150 mcg by mouth daily before breakfast.    Yes Historical Provider, MD  lisinopril (PRINIVIL,ZESTRIL) 40 MG tablet Take 1 tablet by mouth daily. 03/23/16  Yes  Historical Provider, MD  loratadine (CLARITIN) 10 MG tablet Take 10 mg by mouth daily as needed for allergies.   Yes Historical Provider, MD  Melatonin 5 MG TABS Take 5 mg by mouth at bedtime.   Yes Historical Provider, MD  MINIVELLE 0.0375 MG/24HR Place 0.5 patches onto the skin 2 (two) times a week. Thursday AND Sunday 09/23/14  Yes Historical Provider, MD  Multiple Vitamin (MULTIVITAMIN) capsule Take 1 capsule by mouth daily.     Yes Historical Provider, MD  niacin (NIASPAN) 500 MG CR tablet Take 500 mg by mouth at bedtime.   Yes Historical Provider, MD  Omega-3 Fatty Acids (FISH OIL) 1000 MG CAPS Take 3 capsules by mouth daily.     Yes Historical Provider, MD  PRISTIQ 50 MG 24 hr tablet TAKE ONE TABLET BY MOUTH ONCE DAILY 03/24/16  Yes Larey Seat, MD  progesterone (PROMETRIUM) 100 MG capsule Take 100 mg by mouth daily.   Yes Historical Provider, MD  triamterene-hydrochlorothiazide (MAXZIDE-25) 37.5-25 MG per tablet Take 0.5 tablets by mouth daily.  09/27/11  Yes Historical Provider, MD  diazepam (VALIUM) 5 MG tablet Take 1 tablet (5 mg total) by mouth every 12 (twelve) hours as needed for muscle spasms. 04/25/16   Nona Dell, PA-C  oxyCODONE-acetaminophen (PERCOCET/ROXICET) 5-325 MG tablet Take 1-2 tablets by mouth every 4 (four) hours as needed for severe pain. 04/25/16   Chesley Noon Nadeau, PA-C   BP 140/83 mmHg  Pulse 71  Temp(Src) 97.9 F (36.6 C) (Oral)  Resp 20  Ht 5\' 7"  (1.702 m)  Wt 83.462 kg  BMI 28.81 kg/m2  SpO2 99% Physical Exam  Constitutional: She is oriented to person, place, and time. She appears well-developed and well-nourished. No distress.  HENT:  Head: Normocephalic and atraumatic.  Eyes: Conjunctivae and EOM are normal. Right eye exhibits no discharge. Left eye exhibits no discharge. No scleral icterus.  Neck: Normal range of motion. Neck supple.  Cardiovascular: Normal rate, regular rhythm, normal heart sounds and intact distal pulses.    Pulmonary/Chest: Effort normal and breath sounds normal. No respiratory distress. She has no wheezes. She has no rales. She exhibits no tenderness.  Abdominal: Soft. Bowel sounds are normal. She exhibits no distension and no mass. There is tenderness (RLQ). There is no rebound and no guarding.  Musculoskeletal: Normal range of motion. She exhibits tenderness. She exhibits no edema.  No midline C, T, or L tenderness. TTP over right lumbar paraspinal muscles radiating around to RLQ of abdomen and right quadricep. Full range of motion of neck and decreased ROM of back due to reported pain. Full range of motion of bilateral upper extremities, dec ROM of right leg at hip due to reported back pain with movement. Full passive ROM of right hip. Sensation intact.  2+ radial and PT pulses. Cap refill <2 seconds.   Neurological: She is alert and oriented to person, place, and time. She has normal strength and normal reflexes. No sensory deficit.  Skin: Skin is warm and dry. She is not diaphoretic.  Nursing note and vitals reviewed.   ED Course  Procedures (including critical care time) Labs Review Labs Reviewed  URINE CULTURE - Abnormal; Notable for the following:    Culture MULTIPLE SPECIES PRESENT, SUGGEST RECOLLECTION (*)    All other components within normal limits  URINALYSIS, ROUTINE W REFLEX MICROSCOPIC (NOT AT [facility]) - Abnormal; Notable for the following:    Leukocytes, UA SMALL (*)    All other components within normal limits  URINE MICROSCOPIC-ADD ON - Abnormal; Notable for the following:    Squamous Epithelial / LPF 0-5 (*)    Bacteria, UA RARE (*)    All other components within normal limits    Imaging Review No results found. I have personally reviewed and evaluated these images and lab results as part of my medical decision-making.   EKG Interpretation None      MDM   Final diagnoses:  Right-sided low back pain without sciatica    Patient presents with worsening right lower  back pain radiating to her right lower quadrant and right anterior thigh. Pain initially started after bending over while cleaning the bathtub a week ago. Denies fall. No back pain red flags. VSS. Exam revealed tenderness over right lumbar paraspinal muscles, right lower quadrant and right anterior thigh. No peritoneal signs. No midline spinal tenderness. Reported pain with flexion of right and decreased range of motion of right leg due to reported pain. No neuro deficits. Patient given pain meds and muscle relaxant. Plan to order lumbar spine x-ray and UA to evaluate for vertebrae abnormality vs. Kidney stone.   Lumbar spine x-ray revealed no acute osseous abnormality. On reevaluation patient reports her pain has mildly improved. Patient able to stand and ambulate to the restroom. UA unremarkable, no concern for UTI or kidney stone. I suspect pt's sxs are likely musculoskeletal in etiology associated with her back pain from reported injury. I do not suspect cauda equina syndrome or appendicitis at this time and do not feel that any further workup or imaging is warranted at this time. Discussed results and plan for discharge with patient. Patient discharged home with symptomatic treatment advised to follow up with PCP within the next 2 days. Discussed return precautions with patient including fever, abdominal pain, vomiting, saddle anesthesia, urinary incontinence, numbness, tingling, weakness. Patient reports understanding and agreement.   Chesley Noon [place], [place] 04/27/16 [place], MD 04/27/16 2128

## 2016-04-24 NOTE — ED Notes (Signed)
Pt in radiology 

## 2016-04-24 NOTE — ED Notes (Signed)
Informed the pt that a urine specimen is needed. 

## 2016-04-24 NOTE — ED Notes (Signed)
Pt reports right sided back pain, pain down her right leg. Pt says she called her doctor who thinks she may have a budging disk. Pt took muscle relaxer and pain meds around 8pm without relief.

## 2016-04-25 LAB — URINALYSIS, ROUTINE W REFLEX MICROSCOPIC
BILIRUBIN URINE: NEGATIVE
GLUCOSE, UA: NEGATIVE mg/dL
Hgb urine dipstick: NEGATIVE
KETONES UR: NEGATIVE mg/dL
NITRITE: NEGATIVE
Protein, ur: NEGATIVE mg/dL
Specific Gravity, Urine: 1.02 (ref 1.005–1.030)
pH: 5.5 (ref 5.0–8.0)

## 2016-04-25 LAB — URINE MICROSCOPIC-ADD ON: RBC / HPF: NONE SEEN RBC/hpf (ref 0–5)

## 2016-04-25 MED ORDER — DIAZEPAM 5 MG PO TABS
5.0000 mg | ORAL_TABLET | Freq: Two times a day (BID) | ORAL | Status: DC | PRN
Start: 2016-04-25 — End: 2016-07-11

## 2016-04-25 MED ORDER — OXYCODONE-ACETAMINOPHEN 5-325 MG PO TABS: 1.0000 | ORAL_TABLET | ORAL | Status: DC | PRN

## 2016-04-25 NOTE — Discharge Instructions (Signed)
Take your medications as prescribed as needed for pain relief. I recommend continuing to rest and apply heat to affected area for 15-20 minutes 3-4 times daily for pain relief. Follow-up with your primary care provider within the next 2 days. Please return to the Emergency Department if symptoms worsen or new onset of fever, numbness, tingling, groin numbness, new/worsening abdominal pain, vomiting, loss of bowel or bladder, weakness.

## 2016-04-26 LAB — URINE CULTURE: Special Requests: NORMAL

## 2016-04-27 ENCOUNTER — Other Ambulatory Visit: Payer: Self-pay | Admitting: Neurosurgery

## 2016-04-27 DIAGNOSIS — M5416 Radiculopathy, lumbar region: Secondary | ICD-10-CM

## 2016-04-28 ENCOUNTER — Ambulatory Visit
Admission: RE | Admit: 2016-04-28 | Discharge: 2016-04-28 | Disposition: A | Discharge status: Home / Self Care | Payer: Commercial Managed Care - HMO | Source: Ambulatory Visit | Attending: Neurosurgery | Admitting: Neurosurgery

## 2016-04-28 ENCOUNTER — Other Ambulatory Visit: Payer: Self-pay | Admitting: Neurosurgery

## 2016-04-28 VITALS — BP 102/70 | HR 76

## 2016-04-28 DIAGNOSIS — M5416 Radiculopathy, lumbar region: Secondary | ICD-10-CM

## 2016-04-28 DIAGNOSIS — M4316 Spondylolisthesis, lumbar region: Secondary | ICD-10-CM

## 2016-04-28 MED ORDER — IOPAMIDOL (ISOVUE-M 200) INJECTION 41%
1.0000 mL | Freq: Once | INTRAMUSCULAR | Status: AC
  Administered 2016-04-28: 1 mL via EPIDURAL

## 2016-04-28 MED ORDER — DIAZEPAM 5 MG PO TABS
5.0000 mg | ORAL_TABLET | Freq: Once | ORAL | Status: AC
  Administered 2016-04-28: 5 mg via ORAL

## 2016-04-28 MED ORDER — METHYLPREDNISOLONE ACETATE 40 MG/ML INJ SUSP (RADIOLOG
120.0000 mg | Freq: Once | INTRAMUSCULAR | Status: AC
  Administered 2016-04-28: 120 mg via EPIDURAL

## 2016-04-28 NOTE — Discharge Instructions (Signed)

## 2016-06-22 ENCOUNTER — Other Ambulatory Visit: Payer: Self-pay | Admitting: Neurology

## 2016-07-06 ENCOUNTER — Ambulatory Visit: Payer: Commercial Managed Care - HMO | Admitting: Neurology

## 2016-07-11 ENCOUNTER — Ambulatory Visit (INDEPENDENT_AMBULATORY_CARE_PROVIDER_SITE_OTHER): Payer: Commercial Managed Care - HMO | Admitting: Neurology

## 2016-07-11 ENCOUNTER — Encounter: Payer: Self-pay | Admitting: Neurology

## 2016-07-11 VITALS — BP 102/68 | HR 68 | Resp 20 | Ht 67.0 in | Wt 176.0 lb

## 2016-07-11 DIAGNOSIS — G43109 Migraine with aura, not intractable, without status migrainosus: Secondary | ICD-10-CM | POA: Diagnosis not present

## 2016-07-11 NOTE — Progress Notes (Signed)
PATIENT: Crystal Larsen DOB: [birth year]  REASON FOR VISIT: follow up -postconcussive syndrome HISTORY FROM: patient  HISTORY OF PRESENT ILLNESS:  07-11-2016 CD  Crystal Larsen is a 60 year old female with a history of postconcussive syndrome- The concussion occurred 24 years ago. She was originally referred by Dr. Nolon Rod in 2015. The patient was taking Depakote for migraine- after the last visit she decreased her dose by half- taking 250 mg daily and meanwhile discontinued the medication completely.  The patient reports that she has not had any migraine headaches. She continues to notice some changes in her memory. She has trouble remembering conversations but can recall it later on. Denies having to give up anything due to her perceived memory disturbance. She is able to complete all ADLs independently. She operates a Teacher, music without difficulty. Denies any new neurological symptoms. She returns today for an evaluation after having had 3-4 migrainous auras without any pain. She called the office and was told to take Depakote as a pain medication, not a preventive. I explained today that we should use IV Depakote for headaches, not for aura. She is here for a repeat MOCA. Crystal Larsen reports that she sometimes misses parts of the conversation that her family members may recall. She especially mentioned an emergency room visit after which she was convinced that she was told she had muscle spasms but neither her husband nor her daughter recalls her being given a diagnosis at all.  HISTORY 01/06/15: Crystal Larsen is a 60 year old female with a history of post concussive syndrome. She returns today for follow-up. She is currently taking Prestiq for depression. She was started on Ritalin to help with her attentiveness. She reports that she really didn't notice the benefit. She did not have any refills so she is no longer taking it. She states that she has trouble remember things. She will think  conversations take place that doesn't but after a couple of days she will recall it. Or she might forget what day something happened but the next day she can recall it. She is concerned about alzheimer's disease because it does run in her family. She is also on Depakote for migraines and her dose was recently decreased. She continues to not have any migraines.   REVIEW OF SYSTEMS: Out of a complete 14 system review of symptoms, the patient complains only of the following symptoms, and all other reviewed systems are negative.  Snoring, mild cognitive impairment, visual aura.   ALLERGIES: Allergies  Allergen Reactions  . Tramadol Other (See Comments)    seizures  . Gabapentin Itching  . Adhesive [Tape] Rash    bandaide  . Erythromycin Rash  . Latex Rash  . Sulfonamide Derivatives Rash    HOME MEDICATIONS: Outpatient Medications Prior to Visit  Medication Sig Dispense Refill  . dicyclomine (BENTYL) 20 MG tablet Take 20 mg by mouth 3 (three) times daily as needed for spasms.    Crystal Larsen levothyroxine (SYNTHROID, LEVOTHROID) 175 MCG tablet Take 150 mcg by mouth daily before breakfast.     . lisinopril (PRINIVIL,ZESTRIL) 40 MG tablet Take 1 tablet by mouth daily.    Crystal Larsen loratadine (CLARITIN) 10 MG tablet Take 10 mg by mouth daily as needed for allergies.    . Melatonin 5 MG TABS Take 5 mg by mouth at bedtime.    Crystal Larsen MINIVELLE 0.0375 MG/24HR Place 0.5 patches onto the skin 2 (two) times a week. Thursday AND Sunday    . Multiple Vitamin (  MULTIVITAMIN) capsule Take 1 capsule by mouth daily.      . niacin (NIASPAN) 500 MG CR tablet Take 500 mg by mouth at bedtime.    . Omega-3 Fatty Acids (FISH OIL) 1000 MG CAPS Take 3 capsules by mouth daily.      Crystal Larsen oxyCODONE-acetaminophen (PERCOCET/ROXICET) 5-325 MG tablet Take 1-2 tablets by mouth every 4 (four) hours as needed for severe pain. 12 tablet 0  . PRISTIQ 50 MG 24 hr tablet TAKE ONE TABLET BY MOUTH ONCE DAILY 90 tablet 0  . progesterone (PROMETRIUM) 100  MG capsule Take 100 mg by mouth daily.    Crystal Larsen triamterene-hydrochlorothiazide (MAXZIDE-25) 37.5-25 MG per tablet Take 0.5 tablets by mouth daily.     . diazepam (VALIUM) 5 MG tablet Take 1 tablet (5 mg total) by mouth every 12 (twelve) hours as needed for muscle spasms. 10 tablet 0  . divalproex (DEPAKOTE ER) 250 MG 24 hr tablet Take 1 tablet (250 mg total) by mouth daily. 90 tablet 3   No facility-administered medications prior to visit.     PAST MEDICAL HISTORY: Past Medical History:  Diagnosis Date  . Arthritis    "left foot; left hand; right shoulder; back" (11/04/2014)  . Depression   . Gallstones   . GERD (gastroesophageal reflux disease)   . History of gout   . HTN (hypertension)   . Hypothyroidism   . IBS (irritable bowel syndrome)    ?  Crystal Larsen Migraine    "maybe once/yr" (11/04/2014)  . Neuropathy (HCC)    right leg  . Obesity   . PONV (postoperative nausea and vomiting) 1981 X 1   only time  . Seizures ([place]) 2003    due to tramadol reaction -- takes depakote for migraine prevention  . Sleep apnea    does not use cpap  . Unsteady gait     PHYSICAL EXAM  Vitals:   07/11/16 0958  BP: 102/68  Pulse: 68  Resp: 20  Weight: 176 lb (79.8 kg)  Height: 5\' 7"  (1.702 m)   Body mass index is 27.57 kg/m.  Generalized: Well developed, in no acute distress , very jovial and happy. At ease.  Neurological examination  Mentation: Alert oriented to time, place, history taking. Follows all commands speech and language fluent.  Montreal Cognitive Assessment  07/11/2016 07/07/2015  Visuospatial/ Executive (0/5) 5 5  Naming (0/3) 3 3  Attention: Read list of digits (0/2) 2 2  Attention: Read list of letters (0/1) 1 1  Attention: Serial 7 subtraction starting at 100 (0/3) 3 2  Language: Repeat phrase (0/2) 2 2  Language : Fluency (0/1) 1 1  Abstraction (0/2) 2 2  Delayed Recall (0/5) 5 4  Orientation (0/6) 6 6  Total 30 28  Adjusted Score (based on education) 30 -    Cranial  nerve ; The patient reports a change in her taste perception but not so much in smell. Food tastes different seasonings are not bringing the tastes they were before. This caused a [place] impairment can be related to a medication side effect. Pupils were equal round reactive to light. Extraocular movements were full, visual field were full on confrontational test. Facial sensation and strength were normal. Uvula tongue midline. Head turning and shoulder shrug  were normal and symmetric. Motor: The motor testing reveals 5 over 5 strength of all 4 extremities. Good symmetric motor tone is noted throughout.  Sensory: Sensory testing is intact to soft touch on all 4 extremities. No evidence  of extinction is noted.  Coordination: Cerebellar testing reveals good finger-nose-finger and heel-to-shin bilaterally.  Gait and station: Gait is normal. Tandem gait is normal. Romberg is negative. No drift is seen.  Reflexes: Deep tendon reflexes are symmetric and normal bilaterally.   DIAGNOSTIC DATA (LABS, IMAGING, TESTING) - I reviewed patient records, labs, notes, testing and imaging myself where available.  MOCA 30-30  and geriatric depression score 5/15  ASSESSMENT AND PLAN 60 y.o. year old female  has a past medical history of Arthritis; Depression; Gallstones; GERD (gastroesophageal reflux disease); History of gout; HTN (hypertension); Hypothyroidism; IBS (irritable bowel syndrome); Migraine; Neuropathy ([place]); Obesity; PONV (postoperative nausea and vomiting) (1981 X 1); Seizures ([place]) (2003); Sleep apnea; and Unsteady gait. here with:  1. History of Postconcussive syndrome, 24 years ago-  2. Memory disturbance, related to postconcussion .  3. Migraines, painless but visual aura- un related to trauma, was previously on Depakote, meantime discontinued.   Overall the patient is doing well. She continues to not have any migraine associated  Headaches, only a visual aura. New is a gustatory change.  She can  discontinue Depakote. Advised to call and let us know if her headaches return- in that case I would recommend IV treatment during office hours. Her memory score is stable.  Her MOCA today is 30/30 was previously 28/30. Patient advised that if her symptoms worsen or she develops new symptoms she should let us know. She will follow-up prn with NP.       Larey Seat, MD   [facility], Charlynn Grimes  07/11/2016, 10:11 AM [facility] [address] [place], [place] [address] [phone]

## 2016-07-14 ENCOUNTER — Other Ambulatory Visit: Payer: Self-pay | Admitting: Neurosurgery

## 2016-07-14 DIAGNOSIS — M5416 Radiculopathy, lumbar region: Secondary | ICD-10-CM

## 2016-07-17 ENCOUNTER — Encounter: Payer: Self-pay | Admitting: Gastroenterology

## 2016-07-25 ENCOUNTER — Other Ambulatory Visit: Payer: Self-pay | Admitting: Neurosurgery

## 2016-07-25 ENCOUNTER — Ambulatory Visit
Admission: RE | Admit: 2016-07-25 | Discharge: 2016-07-25 | Disposition: A | Discharge status: Home / Self Care | Payer: Commercial Managed Care - HMO | Source: Ambulatory Visit | Attending: Neurosurgery | Admitting: Neurosurgery

## 2016-07-25 DIAGNOSIS — M5416 Radiculopathy, lumbar region: Secondary | ICD-10-CM

## 2016-07-25 MED ORDER — IOPAMIDOL (ISOVUE-M 200) INJECTION 41%
1.0000 mL | Freq: Once | INTRAMUSCULAR | Status: AC
  Administered 2016-07-25: 1 mL via EPIDURAL

## 2016-07-25 MED ORDER — METHYLPREDNISOLONE ACETATE 40 MG/ML INJ SUSP (RADIOLOG
120.0000 mg | Freq: Once | INTRAMUSCULAR | Status: AC
  Administered 2016-07-25: 120 mg via EPIDURAL

## 2016-07-25 MED ORDER — DIAZEPAM 5 MG PO TABS
5.0000 mg | ORAL_TABLET | Freq: Once | ORAL | Status: AC
  Administered 2016-07-25: 5 mg via ORAL

## 2016-08-18 ENCOUNTER — Other Ambulatory Visit: Payer: Self-pay | Admitting: Neurosurgery

## 2016-08-18 DIAGNOSIS — M5416 Radiculopathy, lumbar region: Secondary | ICD-10-CM

## 2016-08-28 ENCOUNTER — Ambulatory Visit
Admission: RE | Admit: 2016-08-28 | Discharge: 2016-08-28 | Disposition: A | Discharge status: Home / Self Care | Payer: Commercial Managed Care - HMO | Source: Ambulatory Visit | Attending: Neurosurgery | Admitting: Neurosurgery

## 2016-08-28 VITALS — BP 97/58 | HR 65

## 2016-08-28 DIAGNOSIS — M5416 Radiculopathy, lumbar region: Secondary | ICD-10-CM

## 2016-08-28 DIAGNOSIS — M4316 Spondylolisthesis, lumbar region: Secondary | ICD-10-CM

## 2016-08-28 MED ORDER — IOPAMIDOL (ISOVUE-M 200) INJECTION 41%
15.0000 mL | Freq: Once | INTRAMUSCULAR | Status: AC
  Administered 2016-08-28: 15 mL via INTRATHECAL

## 2016-08-28 MED ORDER — ONDANSETRON HCL 4 MG/2ML IJ SOLN
4.0000 mg | Freq: Once | INTRAMUSCULAR | Status: AC
  Administered 2016-08-28: 4 mg via INTRAMUSCULAR

## 2016-08-28 MED ORDER — DIAZEPAM 5 MG PO TABS
10.0000 mg | ORAL_TABLET | Freq: Once | ORAL | Status: AC
  Administered 2016-08-28: 10 mg via ORAL

## 2016-08-28 MED ORDER — MEPERIDINE HCL 100 MG/ML IJ SOLN
75.0000 mg | Freq: Once | INTRAMUSCULAR | Status: AC
  Administered 2016-08-28: 75 mg via INTRAMUSCULAR

## 2016-08-28 NOTE — Progress Notes (Signed)
Pt states last dose of Pistiq was Saturday.

## 2016-08-28 NOTE — Discharge Instructions (Signed)
Myelogram Discharge Instructions  1. Go home and rest quietly for the next 24 hours.  It is important to lie flat for the next 24 hours.  Get up only to go to the restroom.  You may lie in the bed or on a couch on your back, your stomach, your left side or your right side.  You may have one pillow under your head.  You may have pillows between your knees while you are on your side or under your knees while you are on your back.  2. DO NOT drive today.  Recline the seat as far back as it will go, while still wearing your seat belt, on the way home.  3. You may get up to go to the bathroom as needed.  You may sit up for 10 minutes to eat.  You may resume your normal diet and medications unless otherwise indicated.  Drink lots of extra fluids today and tomorrow.  4. The incidence of headache, nausea, or vomiting is about 5% (one in 20 patients).  If you develop a headache, lie flat and drink plenty of fluids until the headache goes away.  Caffeinated beverages may be helpful.  If you develop severe nausea and vomiting or a headache that does not go away with flat bed rest, call [phone].  5. You may resume normal activities after your 24 hours of bed rest is over; however, do not exert yourself strongly or do any heavy lifting tomorrow. If when you get up you have a headache when standing, go back to bed and force fluids for another 24 hours.  6. Call your physician for a follow-up appointment.  The results of your myelogram will be sent directly to your physician by the following day.  7. If you have any questions or if complications develop after you arrive home, please call [phone].  Discharge instructions have been explained to the patient.  The patient, or the person responsible for the patient, fully understands these instructions.        May resume Pristiq on Oct. 3, 2017, after 10:30 am.

## 2016-08-30 ENCOUNTER — Telehealth: Payer: Self-pay

## 2016-08-30 NOTE — Telephone Encounter (Signed)
Spoke with patient after her myelogram here 08/28/16.  She denies any headache but reports some lingering pain at injection site.  jkl

## 2016-09-11 ENCOUNTER — Other Ambulatory Visit: Payer: Self-pay | Admitting: Neurosurgery

## 2016-09-11 DIAGNOSIS — M4316 Spondylolisthesis, lumbar region: Secondary | ICD-10-CM

## 2016-09-20 ENCOUNTER — Ambulatory Visit
Admission: RE | Admit: 2016-09-20 | Discharge: 2016-09-20 | Disposition: A | Discharge status: Home / Self Care | Payer: Commercial Managed Care - HMO | Source: Ambulatory Visit | Attending: Neurosurgery | Admitting: Neurosurgery

## 2016-09-20 DIAGNOSIS — M4316 Spondylolisthesis, lumbar region: Secondary | ICD-10-CM

## 2016-09-20 MED ORDER — DIAZEPAM 5 MG PO TABS
10.0000 mg | ORAL_TABLET | Freq: Once | ORAL | Status: AC
  Administered 2016-09-20: 10 mg via ORAL

## 2016-09-20 MED ORDER — IOPAMIDOL (ISOVUE-M 200) INJECTION 41%
1.0000 mL | Freq: Once | INTRAMUSCULAR | Status: AC
  Administered 2016-09-20: 1 mL via EPIDURAL

## 2016-09-20 MED ORDER — METHYLPREDNISOLONE ACETATE 40 MG/ML INJ SUSP (RADIOLOG
120.0000 mg | Freq: Once | INTRAMUSCULAR | Status: AC
  Administered 2016-09-20: 120 mg via EPIDURAL

## 2016-09-20 NOTE — Discharge Instructions (Signed)

## 2016-09-23 ENCOUNTER — Telehealth: Payer: Self-pay | Admitting: Neurology

## 2016-10-11 ENCOUNTER — Telehealth: Payer: Self-pay | Admitting: Radiology

## 2016-10-11 NOTE — Telephone Encounter (Signed)
Broke out in rash 10 days after her injection with lots of itching. Explained I didn't think this was from her injection but I couldn't completely rule it out. Asked her to see her dermatologist and see what he thought.

## 2016-10-25 ENCOUNTER — Telehealth: Payer: Self-pay | Admitting: Neurology

## 2016-10-25 NOTE — Telephone Encounter (Signed)
error 

## 2016-11-06 NOTE — Telephone Encounter (Signed)
Diana: I have currently no experience with the generic Pristiq, and can not advise her either way. She should probably try it out. CD

## 2016-11-06 NOTE — Telephone Encounter (Signed)
Patient is calling to see if it is ok to take generic PRISTIQ 50 MG 24 hr tablet. Her insurance will not cover brand name as of November 27, 2016. Please call patient  and discuss.

## 2016-11-07 NOTE — Telephone Encounter (Signed)
Per Dr Brett Fairy, spoke with patient and advised her of Dr Dohmeier's reply. She stated she still has medication, but when she needs refill she will have pharmacist request generic may be given. Advised she call with any questions, problems. She verbalized understanding, appreciation.

## 2016-11-28 DIAGNOSIS — L308 Other specified dermatitis: Secondary | ICD-10-CM | POA: Diagnosis not present

## 2016-11-28 DIAGNOSIS — L0390 Cellulitis, unspecified: Secondary | ICD-10-CM | POA: Diagnosis not present

## 2016-12-15 DIAGNOSIS — K219 Gastro-esophageal reflux disease without esophagitis: Secondary | ICD-10-CM | POA: Diagnosis not present

## 2016-12-15 DIAGNOSIS — E039 Hypothyroidism, unspecified: Secondary | ICD-10-CM | POA: Diagnosis not present

## 2016-12-25 ENCOUNTER — Other Ambulatory Visit: Payer: Self-pay

## 2016-12-25 ENCOUNTER — Telehealth: Payer: Self-pay | Admitting: Neurology

## 2016-12-25 MED ORDER — DESVENLAFAXINE SUCCINATE ER 50 MG PO TB24: 50.0000 mg | ORAL_TABLET | Freq: Every day | ORAL | 0 refills | Status: DC

## 2016-12-25 NOTE — Telephone Encounter (Signed)
Patient called to see if she is able to receive a one month supply for generic pristiq due to mail order being delivered in about a month.  Pharmacy [facility]

## 2016-12-25 NOTE — Telephone Encounter (Signed)
Received a fax from [facility]. Pt is requesting a 90 day supply of generic pristiq refilled to [facility]. Sent to Dr. Brett Fairy for review.

## 2016-12-25 NOTE — Telephone Encounter (Signed)
error 

## 2016-12-25 NOTE — Telephone Encounter (Signed)
I spoke to pt and advised her that Dr. Brett Fairy approved a one month supply for generic pristiq that went to [facility] on [address]. Pt verbalized understanding.

## 2016-12-25 NOTE — Addendum Note (Signed)
Addended by: Lester [place] A on: 12/25/2016 04:46 PM   Modules accepted: Orders

## 2017-01-04 DIAGNOSIS — L309 Dermatitis, unspecified: Secondary | ICD-10-CM | POA: Diagnosis not present

## 2017-01-04 DIAGNOSIS — L500 Allergic urticaria: Secondary | ICD-10-CM | POA: Diagnosis not present

## 2017-01-08 DIAGNOSIS — L989 Disorder of the skin and subcutaneous tissue, unspecified: Secondary | ICD-10-CM | POA: Diagnosis not present

## 2017-01-31 DIAGNOSIS — M4316 Spondylolisthesis, lumbar region: Secondary | ICD-10-CM | POA: Diagnosis not present

## 2017-02-02 ENCOUNTER — Other Ambulatory Visit: Payer: Self-pay | Admitting: Neurological Surgery

## 2017-02-13 ENCOUNTER — Encounter (HOSPITAL_COMMUNITY): Payer: Self-pay

## 2017-02-14 ENCOUNTER — Encounter (HOSPITAL_COMMUNITY)
Admission: RE | Admit: 2017-02-14 | Discharge: 2017-02-14 | Disposition: A | Discharge status: Home / Self Care | Payer: Commercial Managed Care - HMO | Source: Ambulatory Visit | Attending: Neurological Surgery | Admitting: Neurological Surgery

## 2017-02-14 ENCOUNTER — Encounter (HOSPITAL_COMMUNITY): Payer: Self-pay

## 2017-02-14 DIAGNOSIS — Z01812 Encounter for preprocedural laboratory examination: Secondary | ICD-10-CM | POA: Insufficient documentation

## 2017-02-14 DIAGNOSIS — I10 Essential (primary) hypertension: Secondary | ICD-10-CM | POA: Insufficient documentation

## 2017-02-14 DIAGNOSIS — E669 Obesity, unspecified: Secondary | ICD-10-CM | POA: Diagnosis not present

## 2017-02-14 DIAGNOSIS — Z01810 Encounter for preprocedural cardiovascular examination: Secondary | ICD-10-CM | POA: Insufficient documentation

## 2017-02-14 DIAGNOSIS — M4316 Spondylolisthesis, lumbar region: Secondary | ICD-10-CM | POA: Diagnosis not present

## 2017-02-14 HISTORY — DX: Abnormal levels of other serum enzymes: R74.8

## 2017-02-14 HISTORY — DX: Acute pancreatitis without necrosis or infection, unspecified: K85.90

## 2017-02-14 HISTORY — DX: Spondylolisthesis, lumbar region: M43.16

## 2017-02-14 HISTORY — DX: Presence of spectacles and contact lenses: Z97.3

## 2017-02-14 LAB — COMPREHENSIVE METABOLIC PANEL
ALT: 24 U/L (ref 14–54)
ANION GAP: 10 (ref 5–15)
AST: 32 U/L (ref 15–41)
Albumin: 3.7 g/dL (ref 3.5–5.0)
Alkaline Phosphatase: 76 U/L (ref 38–126)
BUN: 21 mg/dL — ABNORMAL HIGH (ref 6–20)
CALCIUM: 9.7 mg/dL (ref 8.9–10.3)
CHLORIDE: 107 mmol/L (ref 101–111)
CO2: 22 mmol/L (ref 22–32)
Creatinine, Ser: 1.21 mg/dL — ABNORMAL HIGH (ref 0.44–1.00)
GFR calc non Af Amer: 47 mL/min — ABNORMAL LOW (ref >60)
GFR, EST AFRICAN AMERICAN: 55 mL/min — AB (ref >60)
Glucose, Bld: 86 mg/dL (ref 65–99)
Potassium: 3.9 mmol/L (ref 3.5–5.1)
SODIUM: 139 mmol/L (ref 135–145)
Total Bilirubin: 0.8 mg/dL (ref 0.3–1.2)
Total Protein: 7.6 g/dL (ref 6.5–8.1)

## 2017-02-14 LAB — SURGICAL PCR SCREEN
MRSA, PCR: POSITIVE — AB
Staphylococcus aureus: POSITIVE — AB

## 2017-02-14 LAB — TYPE AND SCREEN
ABO/RH(D): O POS
Antibody Screen: NEGATIVE

## 2017-02-14 LAB — CBC
HCT: 39.8 % (ref 36.0–46.0)
HEMOGLOBIN: 13.5 g/dL (ref 12.0–15.0)
MCH: 30.2 pg (ref 26.0–34.0)
MCHC: 33.9 g/dL (ref 30.0–36.0)
MCV: 89 fL (ref 78.0–100.0)
Platelets: 269 10*3/uL (ref 150–400)
RBC: 4.47 MIL/uL (ref 3.87–5.11)
RDW: 14.8 % (ref 11.5–15.5)
WBC: 5 10*3/uL (ref 4.0–10.5)

## 2017-02-14 NOTE — Pre-Procedure Instructions (Signed)
SHERIDEN ARCHIBEQUE  02/14/2017      [place], [place] [address] [place] [address] Phone: [phone] Fax: [place], [place] [facility] [address] [place] [address] [place] [address] Phone: [phone] Fax: [phone]    Your procedure is scheduled on Monday, February 19, 2017  Report to [facility] Admitting at 9:00 A.M.  Call this number if you have problems the morning of surgery:  [phone]   Remember:  Do not eat food or drink liquids after midnight Sunday, February 18, 2017  Take these medicines the morning of surgery with A SIP OF WATER : desvenlafaxine (PRISTIQ),  levothyroxine (SYNTHROID), loratadine (CLARITIN), ranitidine (ZANTAC), if needed: Tylenol for pain Stop taking Aspirin, vitamins, fish oil and herbal medications (Melatonin) . Do not take any NSAIDs ie: Ibuprofen, Advil, Naproxen ( Anaprox) or any medication containing Aspirin; stop now.  Do not wear jewelry, make-up or nail polish.  Do not wear lotions, powders, or perfumes, or deoderant.  Do not shave 48 hours prior to surgery.    Do not bring valuables to the hospital.  [facility] is not responsible for any belongings or valuables.  Contacts, dentures or bridgework may not be worn into surgery.  Leave your suitcase in the car.  After surgery it may be brought to your room. For patients admitted to the hospital, discharge time will be determined by your treatment team. Patients discharged the day of surgery will not be allowed to drive home.  Special instructions:   [place] - Preparing for Surgery  Before surgery, you can play an important role.  Because skin is not sterile, your skin needs to be as free of germs as possible.  You can reduce the number of germs on you skin by washing with CHG (chlorahexidine gluconate) soap before surgery.  CHG is an antiseptic cleaner which  kills germs and bonds with the skin to continue killing germs even after washing.  Please DO NOT use if you have an allergy to CHG or antibacterial soaps.  If your skin becomes reddened/irritated stop using the CHG and inform your nurse when you arrive at Short Stay.  Do not shave (including legs and underarms) for at least 48 hours prior to the first CHG shower.  You may shave your face.  Please follow these instructions carefully:   1.  Shower with CHG Soap the night before surgery and the morning of Surgery.  2.  If you choose to wash your hair, wash your hair first as usual with your normal shampoo.  3.  After you shampoo, rinse your hair and body thoroughly to remove the Shampoo.  4.  Use CHG as you would any other liquid soap.  You can apply chg directly  to the skin and wash gently with scrungie or a clean washcloth.  5.  Apply the CHG Soap to your body ONLY FROM THE NECK DOWN.  Do not use on open wounds or open sores.  Avoid contact with your eyes, ears, mouth and genitals (private parts).  Wash genitals (private parts) with your normal soap.  6.  Wash thoroughly, paying special attention to the area where your surgery will be performed.  7.  Thoroughly rinse your body with warm water from the neck down.  8.  DO NOT shower/wash with your normal soap after using and rinsing off the CHG Soap.  9.  Pat yourself dry with a clean towel.            10.  Wear clean pajamas.            11.  Place clean sheets on your bed the night of your first shower and do not sleep with pets.  Day of Surgery  Do not apply any lotions/deodorants the morning of surgery.  Please wear clean clothes to the hospital/surgery center.  Please read over the following fact sheets that you were given. Pain Booklet, Coughing and Deep Breathing, Blood Transfusion Information, MRSA Information and Surgical Site Infection Prevention

## 2017-02-14 NOTE — Progress Notes (Signed)
Pt denies SOB, chest pain, and being under the care of a cardiologist. Pt denies having a stress test, echo and cardiac cath. Pt denies having an EKG and chest x ray within the last year. Pt denies having recent labs. 

## 2017-02-18 MED ORDER — CEFAZOLIN SODIUM-DEXTROSE 2-4 GM/100ML-% IV SOLN
2.0000 g | INTRAVENOUS | Status: AC
  Administered 2017-02-19: 2 g via INTRAVENOUS
  Filled 2017-02-18: qty 100

## 2017-02-18 NOTE — H&P (Signed)
She has been a patient of Dr. Harley Hallmark, and she has been followed by him with the last visit being in October of this past year.  At that time, she had a myelogram and a post-myelogram CAT scan that demonstrated that she has developed a retrolisthesis at L2-L3 with centralized disc bulging, facet hypertrophy, and a severe and high-grade stenosis.  I demonstrated the findings on the films.  It appeared that the myelogram injection was done above the L2-L3 level, and while she was lying flat and standing all the dye had remained above that level.  Then suddenly, with flexion, it appears the dye traversed the L2-L3 level to fill the lower portion of the spinal canal.  The CT scan demonstrated the degree and severity of the stenosis at L2-L3.  Crystal Larsen has a fusion at L3-L4 that was done in the second procedure, and the original fusion from L4 to the sacrum.  Each of these levels appears to have healed nicely.  The L1-L2 level above her L2-L3 level appears to be quite healthy with normal disc height and smooth borders.  An MRI scan was last completed in 2014 that demonstrated that aside from L4-L5 and L5-S1 at that time, the other discs appear to be quite healthy.     Crystal Larsen appears to be having substantial problems with back pain and some radicular pain that radiates around to the anterior border of the abdomen, particularly on the right side.  She notes that standing straight for any length of time, sitting for too long, and any particular posture for any length of time tends to aggravate pain significantly.  She notes that she has had to give up a lot of physical activities that she enjoyed over the past year because of the degree and severity of this pain.  She is seen now for further evaluation of this process.  She has also had an evaluation by Dr. Lynann Bologna who noted the stenosis at L2-L3 and also suggested that she does need surgery for this process.  EXAMINATION: On examination today, I note that  Crystal Larsen can stand straight and erect for brief periods of time, but in a casual position she does tend to favor a slight forward stoop.  Her motor function appears good in the iliopsoas and the quadriceps, and the tibialis anterior and gastrocs is noted by her ability to toe and heel walk and step onto an 8-inch step with either foot individually.  Her reflexes are symmetric and intact in the patellae and the achilles both.  IMPRESSION: Crystal Larsen has evidence of a significant and quite severe stenosis at the level of L2-L3.  I noted that her adjacent levels above that appear to be healthy.  However, this was the case with her previous surgeries also.  I noted to her that unfortunately, none of [place] can determine when and how soon an adjacent level will degenerate.  But, it is part of a natural history of problems with the discs in the lumbar spine, and this L2-L3 process is no exception.  I did note that at the current time, certainly, her B2-W4, and the joints above that, appear to be quite stable and healthy.  As regard to her postoperative activities, I suggested to Crystal Larsen that I much prefer that she stay as physically active as possible.  I did suggest a program of some postural exercises, and these include things such as yoga, low-impact Pilates, dancing, or either non-contact martial arts that seem to focus on the  patient's posture and stance, engaging the muscles of the lower extremities and the core in an effort to stabilize the spine.     Mckynlie has previously had good relief with the epidural steroid injections, though she did have 1 injection that seemed to yield no relief.  Though the epidurals can be tried intermittently, if the relief is affording her less than 2 to 3 months of relief, this is not a good way to treat this process for the long term.  Ultimately, I do believe that Crystal Larsen needs to consider seriously surgical decompression and stabilization of the L2-L3 joint.  The fate of the  adjacent levels thereafter will be determined independently, but I do believe that she would see good relief with the symptoms that she is experiencing now given the process that is occurring at L2-L3.

## 2017-02-19 ENCOUNTER — Encounter (HOSPITAL_COMMUNITY)
Admission: RE | Disposition: A | Discharge status: Home / Self Care | Payer: Self-pay | Source: Ambulatory Visit | Attending: Neurological Surgery

## 2017-02-19 ENCOUNTER — Inpatient Hospital Stay (HOSPITAL_COMMUNITY): Payer: Commercial Managed Care - HMO

## 2017-02-19 ENCOUNTER — Inpatient Hospital Stay (HOSPITAL_COMMUNITY)
Admission: RE | Admit: 2017-02-19 | Discharge: 2017-02-21 | DRG: 455 | Disposition: A | Discharge status: Home / Self Care | Payer: Commercial Managed Care - HMO | Source: Ambulatory Visit | Attending: Neurological Surgery | Admitting: Neurological Surgery

## 2017-02-19 ENCOUNTER — Encounter (HOSPITAL_COMMUNITY): Payer: Self-pay | Admitting: Certified Registered Nurse Anesthetist

## 2017-02-19 ENCOUNTER — Inpatient Hospital Stay (HOSPITAL_COMMUNITY): Payer: Commercial Managed Care - HMO | Admitting: Certified Registered Nurse Anesthetist

## 2017-02-19 DIAGNOSIS — E039 Hypothyroidism, unspecified: Secondary | ICD-10-CM | POA: Diagnosis not present

## 2017-02-19 DIAGNOSIS — M5416 Radiculopathy, lumbar region: Secondary | ICD-10-CM | POA: Diagnosis present

## 2017-02-19 DIAGNOSIS — M479 Spondylosis, unspecified: Secondary | ICD-10-CM | POA: Diagnosis present

## 2017-02-19 DIAGNOSIS — M48062 Spinal stenosis, lumbar region with neurogenic claudication: Secondary | ICD-10-CM | POA: Diagnosis not present

## 2017-02-19 DIAGNOSIS — K589 Irritable bowel syndrome without diarrhea: Secondary | ICD-10-CM | POA: Diagnosis not present

## 2017-02-19 DIAGNOSIS — I10 Essential (primary) hypertension: Secondary | ICD-10-CM | POA: Diagnosis not present

## 2017-02-19 DIAGNOSIS — M4316 Spondylolisthesis, lumbar region: Secondary | ICD-10-CM | POA: Diagnosis not present

## 2017-02-19 DIAGNOSIS — Z981 Arthrodesis status: Secondary | ICD-10-CM | POA: Diagnosis not present

## 2017-02-19 DIAGNOSIS — M4326 Fusion of spine, lumbar region: Secondary | ICD-10-CM | POA: Diagnosis not present

## 2017-02-19 DIAGNOSIS — M1990 Unspecified osteoarthritis, unspecified site: Secondary | ICD-10-CM | POA: Diagnosis present

## 2017-02-19 DIAGNOSIS — K219 Gastro-esophageal reflux disease without esophagitis: Secondary | ICD-10-CM | POA: Diagnosis present

## 2017-02-19 DIAGNOSIS — G4730 Sleep apnea, unspecified: Secondary | ICD-10-CM | POA: Diagnosis present

## 2017-02-19 DIAGNOSIS — Z419 Encounter for procedure for purposes other than remedying health state, unspecified: Secondary | ICD-10-CM

## 2017-02-19 SURGERY — POSTERIOR LUMBAR FUSION 1 LEVEL
Anesthesia: General | Site: Spine Lumbar

## 2017-02-19 MED ORDER — SODIUM CHLORIDE 0.9 % IV SOLN
INTRAVENOUS | Status: DC
  Administered 2017-02-19: 16:00:00 via INTRAVENOUS

## 2017-02-19 MED ORDER — HYDROMORPHONE HCL 1 MG/ML IJ SOLN
1.0000 mg | INTRAMUSCULAR | Status: DC | PRN
  Administered 2017-02-19: 1 mg via INTRAVENOUS
  Filled 2017-02-19: qty 1

## 2017-02-19 MED ORDER — 0.9 % SODIUM CHLORIDE (POUR BTL) OPTIME
TOPICAL | Status: DC | PRN
  Administered 2017-02-19: 1000 mL

## 2017-02-19 MED ORDER — LIDOCAINE-EPINEPHRINE 2 %-1:100000 IJ SOLN
INTRAMUSCULAR | Status: AC
  Filled 2017-02-19: qty 1

## 2017-02-19 MED ORDER — THROMBIN 5000 UNITS EX SOLR
OROMUCOSAL | Status: DC | PRN
  Administered 2017-02-19: 5 mL via TOPICAL

## 2017-02-19 MED ORDER — TRIAMTERENE-HCTZ 37.5-25 MG PO TABS
0.5000 | ORAL_TABLET | Freq: Every day | ORAL | Status: DC
  Administered 2017-02-20 – 2017-02-21 (×2): 0.5 via ORAL
  Filled 2017-02-19 (×3): qty 0.5

## 2017-02-19 MED ORDER — DOCUSATE SODIUM 100 MG PO CAPS
100.0000 mg | ORAL_CAPSULE | Freq: Two times a day (BID) | ORAL | Status: DC
  Administered 2017-02-19 – 2017-02-21 (×4): 100 mg via ORAL
  Filled 2017-02-19 (×4): qty 1

## 2017-02-19 MED ORDER — MELATONIN 5 MG PO TABS
5.0000 mg | ORAL_TABLET | Freq: Every day | ORAL | Status: DC
  Filled 2017-02-19: qty 1

## 2017-02-19 MED ORDER — BUPIVACAINE HCL (PF) 0.5 % IJ SOLN
INTRAMUSCULAR | Status: DC | PRN
  Administered 2017-02-19: 12 mL
  Administered 2017-02-19: 5 mL

## 2017-02-19 MED ORDER — ALUM & MAG HYDROXIDE-SIMETH 200-200-20 MG/5ML PO SUSP
30.0000 mL | Freq: Four times a day (QID) | ORAL | Status: DC | PRN
  Administered 2017-02-21: 30 mL via ORAL
  Filled 2017-02-19: qty 30

## 2017-02-19 MED ORDER — CHLORHEXIDINE GLUCONATE CLOTH 2 % EX PADS: 6.0000 | MEDICATED_PAD | Freq: Once | CUTANEOUS | Status: DC

## 2017-02-19 MED ORDER — PROBIOTIC PO CAPS: ORAL_CAPSULE | Freq: Every day | ORAL | Status: DC

## 2017-02-19 MED ORDER — DEXAMETHASONE 2 MG PO TABS
2.0000 mg | ORAL_TABLET | Freq: Two times a day (BID) | ORAL | Status: DC
  Administered 2017-02-19 – 2017-02-21 (×4): 2 mg via ORAL
  Filled 2017-02-19 (×5): qty 1

## 2017-02-19 MED ORDER — LIDOCAINE 2% (20 MG/ML) 5 ML SYRINGE
INTRAMUSCULAR | Status: DC | PRN
  Administered 2017-02-19: 80 mg via INTRAVENOUS

## 2017-02-19 MED ORDER — ALBUMIN HUMAN 5 % IV SOLN
INTRAVENOUS | Status: DC | PRN
  Administered 2017-02-19: 12:00:00 via INTRAVENOUS

## 2017-02-19 MED ORDER — OXYCODONE HCL 5 MG PO TABS
5.0000 mg | ORAL_TABLET | ORAL | Status: DC | PRN
  Administered 2017-02-20 – 2017-02-21 (×7): 10 mg via ORAL
  Filled 2017-02-19 (×7): qty 2

## 2017-02-19 MED ORDER — POLYETHYLENE GLYCOL 3350 17 G PO PACK
17.0000 g | PACK | Freq: Every day | ORAL | Status: DC
  Administered 2017-02-20 – 2017-02-21 (×2): 17 g via ORAL
  Filled 2017-02-19 (×2): qty 1

## 2017-02-19 MED ORDER — SODIUM CHLORIDE 0.9% FLUSH
3.0000 mL | Freq: Two times a day (BID) | INTRAVENOUS | Status: DC
  Administered 2017-02-19 – 2017-02-20 (×2): 3 mL via INTRAVENOUS

## 2017-02-19 MED ORDER — MIDAZOLAM HCL 5 MG/5ML IJ SOLN
INTRAMUSCULAR | Status: DC | PRN
  Administered 2017-02-19: 2 mg via INTRAVENOUS

## 2017-02-19 MED ORDER — CHLORHEXIDINE GLUCONATE CLOTH 2 % EX PADS
6.0000 | MEDICATED_PAD | Freq: Every day | CUTANEOUS | Status: DC
  Administered 2017-02-20 – 2017-02-21 (×2): 6 via TOPICAL

## 2017-02-19 MED ORDER — LIDOCAINE-EPINEPHRINE 2 %-1:100000 IJ SOLN
INTRAMUSCULAR | Status: DC | PRN
  Administered 2017-02-19: 5 mL

## 2017-02-19 MED ORDER — FLEET ENEMA 7-19 GM/118ML RE ENEM: 1.0000 | ENEMA | Freq: Once | RECTAL | Status: DC | PRN

## 2017-02-19 MED ORDER — SODIUM CHLORIDE 0.9 % IR SOLN
Status: DC | PRN
  Administered 2017-02-19: 500 mL

## 2017-02-19 MED ORDER — BISACODYL 10 MG RE SUPP: 10.0000 mg | Freq: Every day | RECTAL | Status: DC | PRN

## 2017-02-19 MED ORDER — FAMOTIDINE 20 MG PO TABS
20.0000 mg | ORAL_TABLET | Freq: Two times a day (BID) | ORAL | Status: DC
  Administered 2017-02-19 – 2017-02-21 (×4): 20 mg via ORAL
  Filled 2017-02-19 (×4): qty 1

## 2017-02-19 MED ORDER — FENTANYL CITRATE (PF) 100 MCG/2ML IJ SOLN
INTRAMUSCULAR | Status: DC | PRN
  Administered 2017-02-19: 25 ug via INTRAVENOUS
  Administered 2017-02-19: 50 ug via INTRAVENOUS
  Administered 2017-02-19: 25 ug via INTRAVENOUS
  Administered 2017-02-19: 100 ug via INTRAVENOUS

## 2017-02-19 MED ORDER — ACETAMINOPHEN 325 MG PO TABS
650.0000 mg | ORAL_TABLET | ORAL | Status: DC | PRN
  Administered 2017-02-20 – 2017-02-21 (×2): 650 mg via ORAL
  Filled 2017-02-19 (×2): qty 2

## 2017-02-19 MED ORDER — FENTANYL CITRATE (PF) 100 MCG/2ML IJ SOLN
INTRAMUSCULAR | Status: AC
  Filled 2017-02-19: qty 4

## 2017-02-19 MED ORDER — DEXAMETHASONE SODIUM PHOSPHATE 10 MG/ML IJ SOLN
INTRAMUSCULAR | Status: DC | PRN
  Administered 2017-02-19: 10 mg via INTRAVENOUS

## 2017-02-19 MED ORDER — MENTHOL 3 MG MT LOZG: 1.0000 | LOZENGE | OROMUCOSAL | Status: DC | PRN

## 2017-02-19 MED ORDER — RISAQUAD PO CAPS
1.0000 | ORAL_CAPSULE | Freq: Every day | ORAL | Status: DC
  Administered 2017-02-20 – 2017-02-21 (×2): 1 via ORAL
  Filled 2017-02-19 (×2): qty 1

## 2017-02-19 MED ORDER — LACTATED RINGERS IV SOLN
INTRAVENOUS | Status: DC
Start: 2017-02-19 — End: 2017-02-21
  Administered 2017-02-19 (×2): via INTRAVENOUS
  Administered 2017-02-19: 50 mL/h via INTRAVENOUS

## 2017-02-19 MED ORDER — LEVOCETIRIZINE DIHYDROCHLORIDE 5 MG PO TABS: 5.0000 mg | ORAL_TABLET | Freq: Every day | ORAL | Status: DC

## 2017-02-19 MED ORDER — DIPHENHYDRAMINE HCL 25 MG PO CAPS: 25.0000 mg | ORAL_CAPSULE | Freq: Every evening | ORAL | Status: DC | PRN

## 2017-02-19 MED ORDER — CEFAZOLIN SODIUM-DEXTROSE 2-4 GM/100ML-% IV SOLN
2.0000 g | Freq: Three times a day (TID) | INTRAVENOUS | Status: AC
  Administered 2017-02-19 – 2017-02-20 (×2): 2 g via INTRAVENOUS
  Filled 2017-02-19 (×2): qty 100

## 2017-02-19 MED ORDER — SURGIFOAM 100 EX MISC
CUTANEOUS | Status: DC | PRN
  Administered 2017-02-19: 20 mL via TOPICAL

## 2017-02-19 MED ORDER — PROPOFOL 10 MG/ML IV BOLUS
INTRAVENOUS | Status: DC | PRN
Start: 2017-02-19 — End: 2017-02-19
  Administered 2017-02-19: 40 mg via INTRAVENOUS
  Administered 2017-02-19: 130 mg via INTRAVENOUS

## 2017-02-19 MED ORDER — VENLAFAXINE HCL ER 37.5 MG PO CP24
37.5000 mg | ORAL_CAPSULE | Freq: Every day | ORAL | Status: DC
  Administered 2017-02-20 – 2017-02-21 (×2): 37.5 mg via ORAL
  Filled 2017-02-19 (×2): qty 1

## 2017-02-19 MED ORDER — LEVOTHYROXINE SODIUM 137 MCG PO TABS
137.0000 ug | ORAL_TABLET | Freq: Every day | ORAL | Status: DC
  Administered 2017-02-20 – 2017-02-21 (×2): 137 ug via ORAL
  Filled 2017-02-19 (×2): qty 1

## 2017-02-19 MED ORDER — SENNA 8.6 MG PO TABS
1.0000 | ORAL_TABLET | Freq: Two times a day (BID) | ORAL | Status: DC
  Administered 2017-02-19 – 2017-02-20 (×2): 8.6 mg via ORAL
  Filled 2017-02-19 (×2): qty 1

## 2017-02-19 MED ORDER — PROGESTERONE MICRONIZED 100 MG PO CAPS
100.0000 mg | ORAL_CAPSULE | Freq: Every day | ORAL | Status: DC
  Administered 2017-02-19 – 2017-02-20 (×2): 100 mg via ORAL
  Filled 2017-02-19 (×2): qty 1

## 2017-02-19 MED ORDER — ONDANSETRON HCL 4 MG/2ML IJ SOLN: 4.0000 mg | Freq: Four times a day (QID) | INTRAMUSCULAR | Status: DC | PRN

## 2017-02-19 MED ORDER — LORATADINE 10 MG PO TABS
10.0000 mg | ORAL_TABLET | Freq: Every day | ORAL | Status: DC
  Administered 2017-02-20 – 2017-02-21 (×2): 10 mg via ORAL
  Filled 2017-02-19 (×2): qty 1

## 2017-02-19 MED ORDER — SUCCINYLCHOLINE CHLORIDE 200 MG/10ML IV SOSY
PREFILLED_SYRINGE | INTRAVENOUS | Status: DC | PRN
  Administered 2017-02-19: 60 mg via INTRAVENOUS

## 2017-02-19 MED ORDER — SUGAMMADEX SODIUM 200 MG/2ML IV SOLN
INTRAVENOUS | Status: DC | PRN
  Administered 2017-02-19: 168.4 mg via INTRAVENOUS

## 2017-02-19 MED ORDER — PHENYLEPHRINE 40 MCG/ML (10ML) SYRINGE FOR IV PUSH (FOR BLOOD PRESSURE SUPPORT)
PREFILLED_SYRINGE | INTRAVENOUS | Status: DC | PRN
  Administered 2017-02-19 (×5): 40 ug via INTRAVENOUS

## 2017-02-19 MED ORDER — PHENYLEPHRINE HCL 10 MG/ML IJ SOLN
INTRAVENOUS | Status: DC | PRN
  Administered 2017-02-19: 20 ug/min via INTRAVENOUS

## 2017-02-19 MED ORDER — THROMBIN 5000 UNITS EX SOLR
CUTANEOUS | Status: AC
  Filled 2017-02-19: qty 5000

## 2017-02-19 MED ORDER — ROCURONIUM BROMIDE 10 MG/ML (PF) SYRINGE
PREFILLED_SYRINGE | INTRAVENOUS | Status: DC | PRN
  Administered 2017-02-19: 50 mg via INTRAVENOUS
  Administered 2017-02-19: 10 mg via INTRAVENOUS
  Administered 2017-02-19: 20 mg via INTRAVENOUS

## 2017-02-19 MED ORDER — DOXYLAMINE SUCCINATE (SLEEP) 25 MG PO TABS
25.0000 mg | ORAL_TABLET | Freq: Every day | ORAL | Status: DC
  Filled 2017-02-19: qty 1

## 2017-02-19 MED ORDER — THROMBIN 20000 UNITS EX SOLR
CUTANEOUS | Status: AC
  Filled 2017-02-19: qty 20000

## 2017-02-19 MED ORDER — NIACIN ER (ANTIHYPERLIPIDEMIC) 500 MG PO TBCR
500.0000 mg | EXTENDED_RELEASE_TABLET | Freq: Every day | ORAL | Status: DC
  Filled 2017-02-19 (×2): qty 1

## 2017-02-19 MED ORDER — ESTRADIOL 0.05 MG/24HR TD PTWK: 0.0500 mg | MEDICATED_PATCH | TRANSDERMAL | Status: DC

## 2017-02-19 MED ORDER — ONDANSETRON HCL 4 MG/2ML IJ SOLN
INTRAMUSCULAR | Status: DC | PRN
  Administered 2017-02-19: 4 mg via INTRAVENOUS

## 2017-02-19 MED ORDER — LISINOPRIL 20 MG PO TABS
40.0000 mg | ORAL_TABLET | Freq: Every day | ORAL | Status: DC
  Filled 2017-02-19: qty 2

## 2017-02-19 MED ORDER — DEXAMETHASONE SODIUM PHOSPHATE 4 MG/ML IJ SOLN
2.0000 mg | Freq: Two times a day (BID) | INTRAMUSCULAR | Status: DC
Start: 2017-02-19 — End: 2017-02-21
  Administered 2017-02-19: 2 mg via INTRAVENOUS
  Filled 2017-02-19 (×2): qty 1

## 2017-02-19 MED ORDER — PHENOL 1.4 % MT LIQD: 1.0000 | OROMUCOSAL | Status: DC | PRN

## 2017-02-19 MED ORDER — ONDANSETRON HCL 4 MG PO TABS
4.0000 mg | ORAL_TABLET | Freq: Four times a day (QID) | ORAL | Status: DC | PRN
  Administered 2017-02-19: 4 mg via ORAL
  Filled 2017-02-19: qty 1

## 2017-02-19 MED ORDER — PROPOFOL 10 MG/ML IV BOLUS
INTRAVENOUS | Status: AC
  Filled 2017-02-19: qty 20

## 2017-02-19 MED ORDER — MELATONIN 3 MG PO TABS
3.0000 mg | ORAL_TABLET | Freq: Every day | ORAL | Status: DC
  Administered 2017-02-19 – 2017-02-20 (×2): 3 mg via ORAL
  Filled 2017-02-19 (×2): qty 1

## 2017-02-19 MED ORDER — SODIUM CHLORIDE 0.9% FLUSH: 3.0000 mL | INTRAVENOUS | Status: DC | PRN

## 2017-02-19 MED ORDER — MIDAZOLAM HCL 2 MG/2ML IJ SOLN
INTRAMUSCULAR | Status: AC
  Filled 2017-02-19: qty 2

## 2017-02-19 MED ORDER — POLYETHYLENE GLYCOL 3350 17 G PO PACK: 17.0000 g | PACK | Freq: Every day | ORAL | Status: DC | PRN

## 2017-02-19 MED ORDER — ACETAMINOPHEN 650 MG RE SUPP: 650.0000 mg | RECTAL | Status: DC | PRN

## 2017-02-19 SURGICAL SUPPLY — 73 items
ADH SKN CLS APL DERMABOND .7 (GAUZE/BANDAGES/DRESSINGS) ×1
APL SRG 60D 8 XTD TIP BNDBL (TIP)
BAG DECANTER FOR FLEXI CONT (MISCELLANEOUS) ×3 IMPLANT
BASKET BONE COLLECTION (BASKET) ×2 IMPLANT
BLADE CLIPPER SURG (BLADE) IMPLANT
BUR MATCHSTICK NEURO 3.0 LAGG (BURR) ×3 IMPLANT
CAGE COROENT MP 8X23 (Cage) ×4 IMPLANT
CANISTER SUCT 3000ML PPV (MISCELLANEOUS) ×3 IMPLANT
CARTRIDGE OIL MAESTRO DRILL (MISCELLANEOUS) ×1 IMPLANT
CONNECTOR RELINE 5-6/6.35MM (Connector) ×4 IMPLANT
CONT SPEC 4OZ CLIKSEAL STRL BL (MISCELLANEOUS) ×3 IMPLANT
COVER BACK TABLE 60X90IN (DRAPES) ×3 IMPLANT
DECANTER SPIKE VIAL GLASS SM (MISCELLANEOUS) ×3 IMPLANT
DERMABOND ADVANCED (GAUZE/BANDAGES/DRESSINGS) ×2
DERMABOND ADVANCED .7 DNX12 (GAUZE/BANDAGES/DRESSINGS) ×1 IMPLANT
DEVICE DISSECT PLASMABLAD 3.0S (MISCELLANEOUS) ×1 IMPLANT
DIFFUSER DRILL AIR PNEUMATIC (MISCELLANEOUS) ×1 IMPLANT
DRAPE C-ARM 42X72 X-RAY (DRAPES) ×6 IMPLANT
DRAPE HALF SHEET 40X57 (DRAPES) ×2 IMPLANT
DRAPE LAPAROTOMY 100X72X124 (DRAPES) ×3 IMPLANT
DRAPE POUCH INSTRU U-SHP 10X18 (DRAPES) ×3 IMPLANT
DRSG OPSITE POSTOP 4X8 (GAUZE/BANDAGES/DRESSINGS) ×2 IMPLANT
DURAPREP 26ML APPLICATOR (WOUND CARE) ×3 IMPLANT
DURASEAL APPLICATOR TIP (TIP) IMPLANT
DURASEAL SPINE SEALANT 3ML (MISCELLANEOUS) IMPLANT
ELECT REM PT RETURN 9FT ADLT (ELECTROSURGICAL) ×3
ELECTRODE REM PT RTRN 9FT ADLT (ELECTROSURGICAL) ×1 IMPLANT
GAUZE SPONGE 4X4 12PLY STRL (GAUZE/BANDAGES/DRESSINGS) ×1 IMPLANT
GAUZE SPONGE 4X4 16PLY XRAY LF (GAUZE/BANDAGES/DRESSINGS) ×2 IMPLANT
GLOVE BIOGEL PI IND STRL 7.0 (GLOVE) IMPLANT
GLOVE BIOGEL PI IND STRL 7.5 (GLOVE) IMPLANT
GLOVE BIOGEL PI IND STRL 8.5 (GLOVE) ×2 IMPLANT
GLOVE BIOGEL PI INDICATOR 7.0 (GLOVE) ×4
GLOVE BIOGEL PI INDICATOR 7.5 (GLOVE) ×4
GLOVE BIOGEL PI INDICATOR 8.5 (GLOVE) ×4
GLOVE ECLIPSE 8.5 STRL (GLOVE) ×2 IMPLANT
GLOVE SURG SS PI 7.0 STRL IVOR (GLOVE) ×10 IMPLANT
GLOVE SURG SS PI 8.5 STRL IVOR (GLOVE) ×4
GLOVE SURG SS PI 8.5 STRL STRW (GLOVE) IMPLANT
GOWN STRL REUS W/ TWL LRG LVL3 (GOWN DISPOSABLE) IMPLANT
GOWN STRL REUS W/ TWL XL LVL3 (GOWN DISPOSABLE) IMPLANT
GOWN STRL REUS W/TWL 2XL LVL3 (GOWN DISPOSABLE) ×6 IMPLANT
GOWN STRL REUS W/TWL LRG LVL3 (GOWN DISPOSABLE) ×3
GOWN STRL REUS W/TWL XL LVL3 (GOWN DISPOSABLE) ×6
HEMOSTAT POWDER KIT SURGIFOAM (HEMOSTASIS) ×2 IMPLANT
KIT BASIN OR (CUSTOM PROCEDURE TRAY) ×3 IMPLANT
KIT INFUSE SMALL (Orthopedic Implant) ×2 IMPLANT
KIT ROOM TURNOVER OR (KITS) ×3 IMPLANT
MODULE POWER NUVASIVE (MISCELLANEOUS) IMPLANT
NEEDLE HYPO 22GX1.5 SAFETY (NEEDLE) ×3 IMPLANT
NS IRRIG 1000ML POUR BTL (IV SOLUTION) ×3 IMPLANT
OIL CARTRIDGE MAESTRO DRILL (MISCELLANEOUS)
PACK LAMINECTOMY NEURO (CUSTOM PROCEDURE TRAY) ×3 IMPLANT
PAD ARMBOARD 7.5X6 YLW CONV (MISCELLANEOUS) ×13 IMPLANT
PATTIES SURGICAL .5 X1 (DISPOSABLE) ×1 IMPLANT
PLASMABLADE 3.0S (MISCELLANEOUS) ×3
POWER MODULE NUVASIVE (MISCELLANEOUS) ×3
ROD RELINE TI LATERAL MED OFF (Rod) ×4 IMPLANT
SCREW LOCK RELINE 5.5 TULIP (Screw) ×16 IMPLANT
SCREW RELINE-O POLY 6.5X45 (Screw) ×8 IMPLANT
SPONGE LAP 4X18 X RAY DECT (DISPOSABLE) IMPLANT
SPONGE SURGIFOAM ABS GEL 100 (HEMOSTASIS) ×3 IMPLANT
SUT PROLENE 6 0 BV (SUTURE) IMPLANT
SUT VIC AB 1 CT1 18XBRD ANBCTR (SUTURE) ×1 IMPLANT
SUT VIC AB 1 CT1 8-18 (SUTURE) ×3
SUT VIC AB 2-0 CP2 18 (SUTURE) ×3 IMPLANT
SUT VIC AB 3-0 SH 8-18 (SUTURE) ×5 IMPLANT
SYR 3ML LL SCALE MARK (SYRINGE) ×12 IMPLANT
TOWEL GREEN STERILE (TOWEL DISPOSABLE) ×3 IMPLANT
TOWEL GREEN STERILE FF (TOWEL DISPOSABLE) ×3 IMPLANT
TRAY FOLEY CATH SILVER 16FR LF (SET/KITS/TRAYS/PACK) ×2 IMPLANT
TRAY FOLEY W/METER SILVER 16FR (SET/KITS/TRAYS/PACK) ×1 IMPLANT
WATER STERILE IRR 1000ML POUR (IV SOLUTION) ×3 IMPLANT

## 2017-02-19 NOTE — Transfer of Care (Signed)
Immediate Anesthesia Transfer of Care Note  Patient: Crystal Larsen  Procedure(s) Performed: Procedure(s): Lumbar two-three Posterior lumbar interbody fusion with revision to adjacent level (N/A)  Patient Location: PACU  Anesthesia Type:General  Level of Consciousness: awake and alert   Airway & Oxygen Therapy: Patient Spontanous Breathing and Patient connected to nasal cannula oxygen  Post-op Assessment: Report given to RN and Post -op Vital signs reviewed and stable  Post vital signs: Reviewed and stable  Last Vitals:  Vitals:   02/19/17 0921  BP: 109/83  Pulse: 69  Resp: 20  Temp: 36.6 C    Last Pain:  Vitals:   02/19/17 0921  TempSrc: Oral      Patients Stated Pain Goal: 3 (83/66/29 [id number])  Complications: No apparent anesthesia complications

## 2017-02-19 NOTE — Anesthesia Postprocedure Evaluation (Signed)
Anesthesia Post Note  Patient: Crystal Larsen  Procedure(s) Performed: Procedure(s) (LRB): Lumbar two-three Posterior lumbar interbody fusion with revision to adjacent level (N/A)  Patient location during evaluation: PACU Anesthesia Type: General Level of consciousness: awake and alert Pain management: pain level controlled Vital Signs Assessment: post-procedure vital signs reviewed and stable Respiratory status: spontaneous breathing, nonlabored ventilation, respiratory function stable and patient connected to nasal cannula oxygen Cardiovascular status: blood pressure returned to baseline and stable Postop Assessment: no signs of nausea or vomiting Anesthetic complications: no       Last Vitals:  Vitals:   02/19/17 1515 02/19/17 1530  BP: 93/64 111/64  Pulse: 91 83  Resp: (!) 22 15  Temp:  36.6 C    Last Pain:  Vitals:   02/19/17 1515  TempSrc:   PainSc: Tyler Deis

## 2017-02-19 NOTE — Op Note (Signed)
Date of surgery: 02/19/2017 Preoperative diagnosis: Lumbar spinal stenosis L2-L3, status post decompression arthrodesis L3 to sacrum. Neurogenic claudication, lumbar radiculopathy. Postoperative diagnosis: Same Procedure: Decompression of L2-L3 with bilateral laminotomies and work more then for simple interbody technique. Posterior lumbar interbody arthrodesis using peek spacers local autograft and allograft and infuse L2-3, segmental fixation L2-L3 to sacrum with removal of previous hardware from L3 and revision fixation L2 to the sacrum. Posterior lateral arthrodesis with local autograft and allograft and infuse.  Surgeon: Kristeen Miss First assistant: None Anesthesia: Gen. endotracheal Indications: Crystal Larsen is 61 year old individual who's had significant back bilateral lower extremity pain. She's had previous decompression fusion done by Dr. Joya Salm a myelogram performed in November demonstrated the recurrence of stenosis now at the adjacent level of L2-L3. She was advised regarding the need for surgery.  Procedure: Patient was brought to the operating room supine on a stretcher. After the smooth induction of general endotracheal anesthesia she was carefully turned prone. The back was prepped without wall DuraPrep and draped in a sterile fashion. An elliptical incision was made around her previous scars in the lumbar spine. These were excised. Subcutaneous tissue was dissected down to the lumbar dorsal fascia which was opened on his side of midline to expose the old hardware. At L3 the pedicle screws were asked opposed and the hardware was exposed and to its connection from the previous L4 to sacrum fusion. Side connectors were loosened and the hardware including the L3 screws and side connectors were all removed. Then after exploring and removing fascial tissues around this area to side cars were placed on the previous hardware to allow placement of a 5.5 mm Z rod to the new hardware. The L3  pedicle screws were removed. The dissection was then continued cephalad.  The lamina of L2 was then removed out to and including the entirety of the facet at the L to 3 junction. The thickened redundant yellow ligament and this region was taken up and this allowed for good decompression of the central canal across L2-3 then by working laterally were able to mobilize dura off a significant mass of severely degenerated and desiccated disc material in the subligamentous space. This was removed. A total discectomy performed at the L2-3 level. Comminution of curettes and rongeurs were used to remove the articular cartilage from the endplates at the Q0-0 space. Once the area was prepared appropriately and interbody spacer measuring 8 mm in height with 4 of lordosis and 23 mm in length was used to support this interspace. Similar procedure was carried out on the opposite side and to 8 x 8 x 23 mm 4 lordotic spacers were placed. A total of 6 mL of autologous bone graft along with infuse was placed into the interspace at L2-3. Then pedicle entry sites were chosen at the L2 vertebrae. Transverse processes were cleared and decorticated as were the transverse processes and region of L3. 6.5 x 45 mm pedicle screws were then placed into the pedicles of L2 to and 6.5 x 45 mm screws were placed into L3 bilaterally. A Z rod was then fashioned to the appropriate length and contoured to fit neutrally in the saddles from L2 down to the L4 to sacrum fixation in the side cars. WERE applied and the system was torqued and tightened in a neutral construct. Final radiographs confirm good position of the hardware. Lateral gutters which had previously been decorticated within packed with the remainder of autograft and infuse at L2-L3. Hemostasis in the soft tissues  was checked final confirmation that the common dural tube the L2 and the L3 nerve roots were well decompressed was obtained. With this lumbar dorsal fascia was closed with #1  Vicryl in interrupted fashion 2-0 Vicryl in subcutaneous anus tissues and 3-0 Vicryl subcuticularly. Estimated at 500 mL.

## 2017-02-19 NOTE — Anesthesia Preprocedure Evaluation (Addendum)
Anesthesia Evaluation  Patient identified by MRN, date of birth, ID band Patient awake    Reviewed: Allergy & Precautions, NPO status , Patient's Chart, lab work & pertinent test results  Airway Mallampati: II  TM Distance: >3 FB Neck ROM: Full    Dental  (+) Dental Advisory Given   Pulmonary sleep apnea ,    breath sounds clear to auscultation       Cardiovascular hypertension, Pt. on medications  Rhythm:Regular Rate:Normal     Neuro/Psych    GI/Hepatic Neg liver ROS, GERD  ,  Endo/Other  Hypothyroidism   Renal/GU negative Renal ROS     Musculoskeletal  (+) Arthritis ,   Abdominal   Peds  Hematology negative hematology ROS (+)   Anesthesia Other Findings   Reproductive/Obstetrics                            Lab Results  Component Value Date   WBC 5.0 02/14/2017   HGB 13.5 02/14/2017   HCT 39.8 02/14/2017   MCV 89.0 02/14/2017   PLT 269 02/14/2017   Lab Results  Component Value Date   CREATININE 1.21 (H) 02/14/2017   BUN 21 (H) 02/14/2017   NA 139 02/14/2017   K 3.9 02/14/2017   CL 107 02/14/2017   CO2 22 02/14/2017    Anesthesia Physical Anesthesia Plan  ASA: II  Anesthesia Plan: General   Post-op Pain Management:    Induction: Intravenous  Airway Management Planned: Oral ETT  Additional Equipment:   Intra-op Plan:   Post-operative Plan: Extubation in OR  Informed Consent: I have reviewed the patients History and Physical, chart, labs and discussed the procedure including the risks, benefits and alternatives for the proposed anesthesia with the patient or authorized representative who has indicated his/her understanding and acceptance.   Dental advisory given  Plan Discussed with:   Anesthesia Plan Comments:        Anesthesia Quick Evaluation

## 2017-02-19 NOTE — Anesthesia Procedure Notes (Signed)
Procedure Name: Intubation Date/Time: 02/19/2017 11:18 AM Performed by: Merdis Delay Pre-anesthesia Checklist: Patient identified, Emergency Drugs available, Suction available, Patient being monitored and Timeout performed Patient Re-evaluated:Patient Re-evaluated prior to inductionOxygen Delivery Method: Circle system utilized Preoxygenation: Pre-oxygenation with 100% oxygen Intubation Type: IV induction Ventilation: Mask ventilation without difficulty Laryngoscope Size: Miller and 2 Grade View: Grade III Tube type: Oral Tube size: 7.5 mm Number of attempts: 2 Placement Confirmation: ETT inserted through vocal cords under direct vision,  positive ETCO2 and breath sounds checked- equal and bilateral Secured at: 22 cm Tube secured with: Tape Dental Injury: Injury to lip  Difficulty Due To: Difficulty was anticipated Comments: Performed by Ermalene Postin MD. Poor view with MAC/Miller blades. Stiff larynx. Bougie utilized. RECOMMEND GLIDESCOPE as previously used.

## 2017-02-19 NOTE — Progress Notes (Signed)
Patient ID: Crystal Larsen, female   DOB: 1956-11-23, 61 y.o.   MRN: [id number] Vital signs are stable Patient is doing reasonably well No significant leg pain Dressing is clean and dry

## 2017-02-20 LAB — CBC
HCT: 31.4 % — ABNORMAL LOW (ref 36.0–46.0)
HEMOGLOBIN: 10.8 g/dL — AB (ref 12.0–15.0)
MCH: 30.4 pg (ref 26.0–34.0)
MCHC: 34.4 g/dL (ref 30.0–36.0)
MCV: 88.5 fL (ref 78.0–100.0)
Platelets: 232 10*3/uL (ref 150–400)
RBC: 3.55 MIL/uL — ABNORMAL LOW (ref 3.87–5.11)
RDW: 15.1 % (ref 11.5–15.5)
WBC: 9.1 10*3/uL (ref 4.0–10.5)

## 2017-02-20 LAB — BASIC METABOLIC PANEL
Anion gap: 9 (ref 5–15)
BUN: 16 mg/dL (ref 6–20)
CHLORIDE: 105 mmol/L (ref 101–111)
CO2: 22 mmol/L (ref 22–32)
Calcium: 8.7 mg/dL — ABNORMAL LOW (ref 8.9–10.3)
Creatinine, Ser: 0.97 mg/dL (ref 0.44–1.00)
GFR calc Af Amer: >60 mL/min (ref >60)
GFR calc non Af Amer: >60 mL/min (ref >60)
GLUCOSE: 146 mg/dL — AB (ref 65–99)
POTASSIUM: 4.3 mmol/L (ref 3.5–5.1)
SODIUM: 136 mmol/L (ref 135–145)

## 2017-02-20 MED ORDER — METHOCARBAMOL 500 MG PO TABS
500.0000 mg | ORAL_TABLET | Freq: Four times a day (QID) | ORAL | Status: DC | PRN
  Administered 2017-02-20 – 2017-02-21 (×3): 500 mg via ORAL
  Filled 2017-02-20 (×3): qty 1

## 2017-02-20 NOTE — Evaluation (Signed)
Occupational Therapy Evaluation Patient Details Name: Crystal Larsen MRN: [id number] DOB: 08-10-1956 Today's Date: 02/20/2017    History of Present Illness Pt is a 61 y.o. female S/P L2-L3 laminectomies and fusion to sacrum.  PMH includes L2-L3 spinal stenosis, arthrodesis L3 to sacrum, neurogenic  claudication, lumbar radiculopathy, HTN, sleep apnea, GERD, hypothyroidism and arthritis   Clinical Impression   Patient evaluated by Occupational Therapy with no further acute OT needs identified. All education has been completed and the patient has no further questions. See below for any follow-up Occupational Therapy or equipment needs. OT to sign off. Thank you for referral.      Follow Up Recommendations  No OT follow up    Equipment Recommendations  None recommended by OT    Recommendations for Other Services       Precautions / Restrictions Precautions Precautions: Back;Fall Precaution Booklet Issued: Yes (comment) Precaution Comments: Back Precautions reviewed for adls. Required Braces or Orthoses: Spinal Brace Spinal Brace: Lumbar corset;Applied in sitting position (adjusted in standing) Restrictions Weight Bearing Restrictions: No      Mobility Bed Mobility               General bed mobility comments: in chair on arrival and does not express concern or questions for bed mobility at thi s time  Transfers       Sit to Stand: Supervision              Balance                                           ADL either performed or assessed with clinical judgement   ADL Overall ADL's : Modified independent                                       General ADL Comments: pt will have spouse (A) upon d/c   Pt educated on bathing and avoid washing directly on incision. Pt educated to use new wash cloth and towel each day. Pt educated to allow water to run across dressing and not to soak in a tub at this time. Pt advised RN will  instruct on any bandages required otherwise is open to air. Back handout provided and reviewed adls in detail. Pt educated on: clothing between brace, never sleep in brace, set an alarm at night for medication, avoid sitting for long periods of time, correct bed positioning for sleeping, correct sequence for bed mobility, avoiding lifting more than 5 pounds and never wash directly over incision. All education is complete and patient indicates understanding. Pt educated on dog safety in the home.        Vision   Vision Assessment?: No apparent visual deficits     Perception     Praxis      Pertinent Vitals/Pain Pain Assessment: 0-10 Pain Score: 4  Pain Location: incision site Pain Descriptors / Indicators: Sore;Operative site guarding Pain Intervention(s): Monitored during session;Premedicated before session;Repositioned     Hand Dominance Right   Extremity/Trunk Assessment Upper Extremity Assessment Upper Extremity Assessment: Overall WFL for tasks assessed   Lower Extremity Assessment Lower Extremity Assessment: Defer to PT evaluation   Cervical / Trunk Assessment Cervical / Trunk Assessment: Normal   Communication Communication Communication: No difficulties   Cognition Arousal/Alertness: Awake/alert Behavior  During Therapy: WFL for tasks assessed/performed;Anxious Overall Cognitive Status: Within Functional Limits for tasks assessed                                 General Comments: pt reports things are a little different this time but in a good way   General Comments  pt reports "i am a little sore from getting up but I am really good compared to last time. "     Exercises     Shoulder Instructions      Home Living Family/patient expects to be discharged to:: Private residence Living Arrangements: Spouse/significant other Available Help at Discharge: Family Type of Home: House Home Access: Stairs to enter Technical brewer of Steps:  2 Entrance Stairs-Rails: None Home Layout: One level     Bathroom Shower/Tub: Teacher, early years/pre: Standard Bathroom Accessibility: Yes   Home Equipment: Environmental consultant - 2 wheels;Bedside commode;Hand held shower head   Additional Comments: educated on safety with 92 pound german shepard dog at home. given strategies for safety and home setup      Prior Functioning/Environment Level of Independence: Independent        Comments: uses 3-in-1 for bathing with supervision from husband for safety        OT Problem List:        OT Treatment/Interventions:      OT Goals(Current goals can be found in the care plan section) Acute Rehab OT Goals Patient Stated Goal: Wants to get back to volunteering at [employer] to Achieve Goals: Good  OT Frequency:     Barriers to D/C:            Co-evaluation              End of Session Equipment Utilized During Treatment: Back brace Nurse Communication: Mobility status;Precautions  Activity Tolerance: Patient tolerated treatment well Patient left: in chair;with call bell/phone within reach  OT Visit Diagnosis: Unsteadiness on feet (R26.81)                Time: 2694-8546 OT Time Calculation (min): 28 min Charges:  OT General Charges $OT Visit: 1 Procedure OT Evaluation $OT Eval Moderate Complexity: 1 Procedure G-Codes:      Jeri Modena   OTR/L Pager: [phone] Office: [phone] .   Parke Poisson B 02/20/2017, 1:53 PM

## 2017-02-20 NOTE — Progress Notes (Signed)
Patient ID: Crystal Larsen, female   DOB: 11-28-1955, 61 y.o.   MRN: [id number] Vital signs are stable Patient is feeling well She is ambulated today Voiding spontaneously Mobilizing well We'll plan discharge for the morning

## 2017-02-20 NOTE — Evaluation (Signed)
Physical Therapy Evaluation Patient Details Name: Crystal Larsen MRN: [id number] DOB: 10-03-1956 Today's Date: 02/20/2017   History of Present Illness  Pt is a 60 y.o. female S/P L2-L3 laminectomies and fusion to sacrum.  PMH includes L2-L3 spinal stenosis, arthrodesis L3 to sacrum, neurogenic  claudication, lumbar radiculopathy, HTN, sleep apnea, GERD, hypothyroidism and arthritis  Clinical Impression  Pt admitted with above diagnosis. Pt currently with functional limitations due to the deficits listed below (see PT Problem List). At the time of PT eval pt was able to perform transfers and ambulation with gross min guard assist to supervision for safety. Pt completed stair training this session and was educated on precautions and brace application. Pt anticipates d/c home with husband either later today or tomorrow. Pt will benefit from skilled PT to increase their independence and safety with mobility to allow discharge to the venue listed below.      Follow Up Recommendations Outpatient PT    Equipment Recommendations  Single Point Cane    Recommendations for Other Services       Precautions / Restrictions Precautions Precautions: Back;Fall Precaution Booklet Issued: Yes (comment) Precaution Comments: Back Precautions Required Braces or Orthoses: Spinal Brace Spinal Brace: Lumbar corset;Applied in sitting position (adjusted in standing) Restrictions Weight Bearing Restrictions: No      Mobility  Bed Mobility Overal bed mobility: Needs Assistance Bed Mobility: Rolling;Sidelying to Sit Rolling: Min guard Sidelying to sit: Min guard       General bed mobility comments: Pt familiar with log roll from previous surgeries, but needed reinforcement and practice with sequencing and maintaining precautions.  Transfers Overall transfer level: Needs assistance Equipment used: Straight cane;Rolling walker (2 wheeled) Transfers: Sit to/from Stand Sit to Stand: Supervision          General transfer comment: no AD for sit to stand but pt had straight cane for stand to sit. pt instructed to put cane aside and use both hands on armrests for controlled descent into chair.  Ambulation/Gait Ambulation/Gait assistance: Min guard;Supervision Ambulation Distance (Feet): 250 Feet Assistive device: None;Straight cane Gait Pattern/deviations: Step-through pattern;Decreased stride length;Wide base of support Gait velocity: decreased Gait velocity interpretation: Below normal speed for age/gender General Gait Details: pt started without cane, but requested to try it after about 100 feet.  Pt did not depend on the cane for support, but felt more confident and safe with it.  She switched from holding it in R and L hand because of uncertainty which felt better.  Stairs Stairs: Yes Stairs assistance: Min guard Stair Management: One rail Left;Step to pattern;With cane Number of Stairs: 3 (x 2) General stair comments: Pt used L rail on first trial but with second trial, demonstrated more confidence and stability and was able to complete without railing.  After stair training pt was surprised with her performance.  Wheelchair Mobility    Modified Rankin (Stroke Patients Only)       Balance Overall balance assessment: Needs assistance Sitting-balance support: Bilateral upper extremity supported Sitting balance-Leahy Scale: Good     Standing balance support: No upper extremity supported;Single extremity supported Standing balance-Leahy Scale: Good Standing balance comment: Pt's perceived balance abilities were possibly altered by her lack of self confidence.                             Pertinent Vitals/Pain Pain Assessment: 0-10 Pain Score: 4  Pain Location: incision site Pain Descriptors / Indicators: Sore;Operative site guarding Pain  Intervention(s): Monitored during session;Repositioned    Home Living Family/patient expects to be discharged to:: Private  residence Living Arrangements: Spouse/significant other Available Help at Discharge: Family Type of Home: House Home Access: Stairs to enter Entrance Stairs-Rails: None Entrance Stairs-Number of Steps: 2 Home Layout: One level Home Equipment: Environmental consultant - 2 wheels;Bedside commode;Hand held shower head      Prior Function Level of Independence: Independent         Comments: uses 3-in-1 for bathing with supervision from husband for safety     Hand Dominance        Extremity/Trunk Assessment   Upper Extremity Assessment Upper Extremity Assessment: Defer to OT evaluation    Lower Extremity Assessment Lower Extremity Assessment: Overall WFL for tasks assessed;Generalized weakness    Cervical / Trunk Assessment Cervical / Trunk Assessment: Normal  Communication   Communication: No difficulties  Cognition Arousal/Alertness: Awake/alert Behavior During Therapy: WFL for tasks assessed/performed;Anxious Overall Cognitive Status: Within Functional Limits for tasks assessed                                 General Comments: pt was unsure of what she is "allowed to do" and seemed easily distracted at different times during the session. Pt able to recall back precautions 3/3 with added "A" for "arch."      General Comments General comments (skin integrity, edema, etc.): Pt was surprised by her abilities after surgery.  She had spinal surgery in 2015 and stated that her therapy experience was much different from today's: she was unable to get up before therapy saw her before, but stated she had been up with nursing before we came.    Exercises     Assessment/Plan    PT Assessment Patient needs continued PT services  PT Problem List Decreased strength;Decreased balance;Decreased coordination;Decreased mobility;Decreased activity tolerance;Decreased safety awareness;Decreased knowledge of precautions;Pain       PT Treatment Interventions DME instruction;Gait  training;Stair training;Functional mobility training;Therapeutic activities;Therapeutic exercise;Balance training;Neuromuscular re-education;Patient/family education    PT Goals (Current goals can be found in the Care Plan section)  Acute Rehab PT Goals Patient Stated Goal: Wants to get back to volunteering at [facility] PT Goal Formulation: With patient Time For Goal Achievement: 02/27/18 Potential to Achieve Goals: Good    Frequency Min 5X/week   Barriers to discharge        Co-evaluation               End of Session Equipment Utilized During Treatment: Gait belt;Back brace Activity Tolerance: Patient tolerated treatment well Patient left: in chair;with call bell/phone within reach Nurse Communication: Mobility status PT Visit Diagnosis: Unsteadiness on feet (R26.81);Pain Pain - part of body:  (Back)    Time: 4536-4680 PT Time Calculation (min) (ACUTE ONLY): 42 min   Charges:   PT Evaluation $PT Eval Moderate Complexity: 1 Procedure PT Treatments $Gait Training: 23-37 mins   PT G Codes:        Rolinda Roan, PT, DPT Acute Rehabilitation Services Pager: [phone]   Thelma Comp 02/20/2017, 10:44 AM   Rolinda Roan, PT, DPT Acute Rehabilitation Services Pager: [phone]

## 2017-02-21 MED ORDER — OXYCODONE HCL 5 MG PO TABS: 5.0000 mg | ORAL_TABLET | ORAL | 0 refills | Status: DC | PRN

## 2017-02-21 MED ORDER — DEXAMETHASONE 1 MG PO TABS: ORAL_TABLET | ORAL | 0 refills | Status: DC

## 2017-02-21 MED ORDER — METHOCARBAMOL 500 MG PO TABS: 500.0000 mg | ORAL_TABLET | Freq: Four times a day (QID) | ORAL | 3 refills | Status: DC | PRN

## 2017-02-21 NOTE — Progress Notes (Signed)
Patient alert and oriented, mae's well, voiding adequate amount of urine, swallowing without difficulty, no c/o pain at time of discharge. Patient discharged home with family. Script and discharged instructions given to patient. Patient and family stated understanding of instructions given. Patient has an appointment with Dr. Elsner  

## 2017-02-21 NOTE — Progress Notes (Signed)
qPhysical Therapy Treatment and [place] Patient Details Name: JAVIONNA LEDER MRN: [id number] DOB: 16-May-1956 Today's Date: 02/21/2017    History of Present Illness Pt is a 61 y.o. female S/P L2-L3 laminectomies and fusion to sacrum.  PMH includes L2-L3 spinal stenosis, arthrodesis L3 to sacrum, neurogenic  claudication, lumbar radiculopathy, HTN, sleep apnea, GERD, hypothyroidism and arthritis.    PT Comments    Pt is progressing well towards goals and is Modified Independent with mobility, transfers, and ambulation.  She will benefit from Outpatient PT to regain strength, endurance, and full functional mobility.  PT has signed off but please contact if further assistance is needed.    Follow Up Recommendations  Outpatient PT     Equipment Recommendations  Cane (Pt bought independently)    Recommendations for Other Services       Precautions / Restrictions Precautions Precautions: Back Precaution Comments: Pt able to recall precautions BLT, adding A for Arch. Required Braces or Orthoses: Spinal Brace Spinal Brace: Lumbar corset Restrictions Weight Bearing Restrictions: No    Mobility  Bed Mobility Overal bed mobility: Modified Independent Bed Mobility: Sit to Sidelying;Rolling Rolling: Independent       Sit to sidelying: Modified independent (Device/Increase time) General bed mobility comments: Pt mentioned discomfort and bloated feeling with brace when sitting in chair. Pt went to bed at end of session to take a break from wearing brace.  Transfers Overall transfer level: Modified independent Equipment used: Straight cane Transfers: Sit to/from Stand Sit to Stand: Modified independent (Device/Increase time)         General transfer comment: Pt maintained precautions and demonstrated proper hand placement.  Ambulation/Gait Ambulation/Gait assistance: Modified independent (Device/Increase time) Ambulation Distance (Feet): 250 Feet Assistive device: Straight  cane Gait Pattern/deviations: Decreased stride length;Step-through pattern;Wide base of support Gait velocity: decreased Gait velocity interpretation: at or above normal speed for age/gender General Gait Details: pt used cane but did not depend on it for stability.  She seems more confident by having an extra contact point with the ground.  Held cane in L hand.   Stairs Stairs: Yes   Stair Management: Step to pattern Number of Stairs: 6 General stair comments: Pt cued on correct sequence- up with the good, down with the bad.  She used rail on R to descend. Pt stated she was comfortable and did not need a second practice trial on stairs.  Wheelchair Mobility    Modified Rankin (Stroke Patients Only)       Balance Overall balance assessment: Modified Independent Sitting-balance support: Bilateral upper extremity supported Sitting balance-Leahy Scale: Good     Standing balance support: No upper extremity supported;Single extremity supported Standing balance-Leahy Scale: Good Standing balance comment: Pt's perceived balance abilities were possibly altered by her lack of self confidence.                            Cognition Arousal/Alertness: Awake/alert Behavior During Therapy: WFL for tasks assessed/performed Overall Cognitive Status: Within Functional Limits for tasks assessed                                 General Comments: Pt still seemed anxious but less than previous session.      Exercises      General Comments General comments (skin integrity, edema, etc.): Pt's main source of discomfort was incision site and brace tightness.  Pertinent Vitals/Pain Pain Assessment: Faces Faces Pain Scale: Hurts a little bit Pain Location: incision site Pain Descriptors / Indicators: Sore;Operative site guarding Pain Intervention(s): Monitored during session;Repositioned    Home Living                      Prior Function             PT Goals (current goals can now be found in the care plan section) Acute Rehab PT Goals Patient Stated Goal: Wants to get back to volunteering at [facility] PT Goal Formulation: With patient Time For Goal Achievement: 02/28/17 Potential to Achieve Goals: Good Progress towards PT goals: Progressing toward goals    Frequency    Min 5X/week      PT Plan Current plan remains appropriate    Co-evaluation             End of Session Equipment Utilized During Treatment: Gait belt;Back brace Activity Tolerance: Patient tolerated treatment well Patient left: in bed;with call bell/phone within reach Nurse Communication: Mobility status PT Visit Diagnosis: Unsteadiness on feet (R26.81);Pain Pain - part of body:  (Back)     Time: 8828-0034 PT Time Calculation (min) (ACUTE ONLY): 24 min  Charges:  $Gait Training: 23-37 mins                    G Codes:        Gaetano Net SPT  Gaetano Net 02/21/2017, 1:21 PM

## 2017-02-21 NOTE — Discharge Summary (Signed)
Physician Discharge Summary  Patient ID: Crystal Larsen MRN: [id number] DOB/AGE: [birth year] 61 y.o.  Admit date: 02/19/2017 Discharge date: 02/21/2017  Admission Diagnoses:Spondylosis and stenosis L2-L3 with lumbar radiculopathy status post arthrodesis L3 to sacrum  Discharge Diagnoses: Spondylosis and stenosis L2-3 with lumbar radiculopathy, status post arthrodesis L3 to sacrum. Active Problems:   Lumbar stenosis with neurogenic claudication   Discharged Condition: good  Hospital Course: Patient was admitted to undergo surgical revision of her back fusion with an additional fusion at L2-L3. She tolerated this well.  Consults: None  Significant Diagnostic Studies: None  Treatments: surgery: Decompression and fusion L2-3 with tie into L3 to sacrum fusion.  Discharge Exam: Blood pressure 108/74, pulse 71, temperature 97.5 F (36.4 C), temperature source Axillary, resp. rate 18, SpO2 100 %. Incision is clean and dry station and gait are intact.  Disposition: 01-Home or Self Care  Discharge Instructions    Call MD for:  redness, tenderness, or signs of infection (pain, swelling, redness, odor or green/yellow discharge around incision site)    Complete by:  As directed    Call MD for:  severe uncontrolled pain    Complete by:  As directed    Call MD for:  temperature >100.4    Complete by:  As directed    Diet - low sodium heart healthy    Complete by:  As directed    Discharge instructions    Complete by:  As directed    Okay to shower. Do not apply salves or appointments to incision. No heavy lifting with the upper extremities greater than 15 pounds. May resume driving when not requiring pain medication and patient feels comfortable with doing so.   Incentive spirometry RT    Complete by:  As directed    Increase activity slowly    Complete by:  As directed      Allergies as of 02/21/2017      Reactions   Ultram [tramadol] Other (See Comments)   seizures   Adhesive  [tape] Rash   bandaide   Erythromycin Rash   Latex Rash   Neurontin [gabapentin] Itching   Sulfonamide Derivatives Rash      Medication List    STOP taking these medications   naproxen sodium 220 MG tablet Commonly known as:  ANAPROX     TAKE these medications   acetaminophen 500 MG tablet Commonly known as:  TYLENOL Take 1,000 mg by mouth every 6 (six) hours as needed (for general aches/pain.).   aspirin EC 81 MG tablet Take 81 mg by mouth at bedtime.   desvenlafaxine 50 MG 24 hr tablet Commonly known as:  PRISTIQ Take 1 tablet (50 mg total) by mouth daily.   dexamethasone 1 MG tablet Commonly known as:  DECADRON 2 tablets twice daily for 2 days, one tablet twice daily for 2 days, one tablet daily for 2 days.   doxylamine (Sleep) 25 MG tablet Commonly known as:  UNISOM Take 25 mg by mouth at bedtime.   Fish Oil 1000 MG Caps Take 3,000 mg by mouth at bedtime.   levocetirizine 5 MG tablet Commonly known as:  XYZAL Take 5 mg by mouth at bedtime.   levothyroxine 137 MCG tablet Commonly known as:  SYNTHROID, LEVOTHROID Take 137 mcg by mouth daily before breakfast.   lisinopril 40 MG tablet Commonly known as:  PRINIVIL,ZESTRIL Take 40 mg by mouth daily.   loratadine 10 MG tablet Commonly known as:  CLARITIN Take 10 mg by mouth daily.  Melatonin 5 MG Tabs Take 5 mg by mouth at bedtime.   methocarbamol 500 MG tablet Commonly known as:  ROBAXIN Take 1 tablet (500 mg total) by mouth every 6 (six) hours as needed for muscle spasms.   MINIVELLE 0.0375 MG/24HR Generic drug:  estradiol Place 0.5 patches onto the skin 2 (two) times a week. Thursday AND Sunday   multivitamin with minerals Tabs tablet Take 1 tablet by mouth at bedtime.   niacin 500 MG CR tablet Commonly known as:  NIASPAN Take 500 mg by mouth at bedtime.   oxyCODONE 5 MG immediate release tablet Commonly known as:  Oxy IR/ROXICODONE Take 1-2 tablets (5-10 mg total) by mouth every 3 (three)  hours as needed for breakthrough pain.   polyethylene glycol packet Commonly known as:  MIRALAX / GLYCOLAX Take 17 g by mouth daily.   PROBIOTIC PO Take 1 capsule by mouth daily.   progesterone 100 MG capsule Commonly known as:  PROMETRIUM Take 100 mg by mouth at bedtime.   ranitidine 150 MG tablet Commonly known as:  ZANTAC Take 150 mg by mouth 2 (two) times daily.   triamterene-hydrochlorothiazide 37.5-25 MG tablet Commonly known as:  MAXZIDE-25 Take 0.5 tablets by mouth daily.        SignedEarleen Newport 02/21/2017, 9:02 AM

## 2017-03-06 NOTE — Addendum Note (Signed)
Encounter addended by: Ronnette Hila, RN on: 03/06/2017  3:17 PM<BR>    Actions taken: Delete clinical note

## 2017-03-15 DIAGNOSIS — M4316 Spondylolisthesis, lumbar region: Secondary | ICD-10-CM | POA: Diagnosis not present

## 2017-03-19 DIAGNOSIS — K219 Gastro-esophageal reflux disease without esophagitis: Secondary | ICD-10-CM | POA: Diagnosis not present

## 2017-03-20 DIAGNOSIS — Z79899 Other long term (current) drug therapy: Secondary | ICD-10-CM | POA: Diagnosis not present

## 2017-03-20 DIAGNOSIS — N183 Chronic kidney disease, stage 3 (moderate): Secondary | ICD-10-CM | POA: Diagnosis not present

## 2017-03-20 DIAGNOSIS — Z1159 Encounter for screening for other viral diseases: Secondary | ICD-10-CM | POA: Diagnosis not present

## 2017-03-20 DIAGNOSIS — E039 Hypothyroidism, unspecified: Secondary | ICD-10-CM | POA: Diagnosis not present

## 2017-03-20 DIAGNOSIS — Z0000 Encounter for general adult medical examination without abnormal findings: Secondary | ICD-10-CM | POA: Diagnosis not present

## 2017-03-20 DIAGNOSIS — I10 Essential (primary) hypertension: Secondary | ICD-10-CM | POA: Diagnosis not present

## 2017-03-22 ENCOUNTER — Other Ambulatory Visit: Payer: Self-pay | Admitting: Neurology

## 2017-04-09 DIAGNOSIS — L239 Allergic contact dermatitis, unspecified cause: Secondary | ICD-10-CM | POA: Diagnosis not present

## 2017-04-09 DIAGNOSIS — Z0182 Encounter for allergy testing: Secondary | ICD-10-CM | POA: Diagnosis not present

## 2017-04-09 DIAGNOSIS — L509 Urticaria, unspecified: Secondary | ICD-10-CM | POA: Diagnosis not present

## 2017-04-11 DIAGNOSIS — L239 Allergic contact dermatitis, unspecified cause: Secondary | ICD-10-CM | POA: Diagnosis not present

## 2017-04-11 DIAGNOSIS — Z0182 Encounter for allergy testing: Secondary | ICD-10-CM | POA: Diagnosis not present

## 2017-04-13 DIAGNOSIS — L239 Allergic contact dermatitis, unspecified cause: Secondary | ICD-10-CM | POA: Diagnosis not present

## 2017-04-13 DIAGNOSIS — Z0182 Encounter for allergy testing: Secondary | ICD-10-CM | POA: Diagnosis not present

## 2017-04-18 DIAGNOSIS — J309 Allergic rhinitis, unspecified: Secondary | ICD-10-CM | POA: Diagnosis not present

## 2017-04-18 DIAGNOSIS — L299 Pruritus, unspecified: Secondary | ICD-10-CM | POA: Diagnosis not present

## 2017-05-16 DIAGNOSIS — Z1231 Encounter for screening mammogram for malignant neoplasm of breast: Secondary | ICD-10-CM | POA: Diagnosis not present

## 2017-06-13 DIAGNOSIS — Z01419 Encounter for gynecological examination (general) (routine) without abnormal findings: Secondary | ICD-10-CM | POA: Diagnosis not present

## 2017-08-20 DIAGNOSIS — L509 Urticaria, unspecified: Secondary | ICD-10-CM | POA: Diagnosis not present

## 2017-09-05 DIAGNOSIS — M48062 Spinal stenosis, lumbar region with neurogenic claudication: Secondary | ICD-10-CM | POA: Diagnosis not present

## 2017-09-05 DIAGNOSIS — M4316 Spondylolisthesis, lumbar region: Secondary | ICD-10-CM | POA: Diagnosis not present

## 2017-09-06 ENCOUNTER — Encounter: Payer: Self-pay | Admitting: Adult Health

## 2017-09-06 ENCOUNTER — Ambulatory Visit (INDEPENDENT_AMBULATORY_CARE_PROVIDER_SITE_OTHER): Payer: 59 | Admitting: Adult Health

## 2017-09-06 VITALS — BP 149/84 | HR 92 | Ht 67.0 in | Wt 186.0 lb

## 2017-09-06 DIAGNOSIS — H539 Unspecified visual disturbance: Secondary | ICD-10-CM | POA: Diagnosis not present

## 2017-09-06 DIAGNOSIS — R51 Headache: Secondary | ICD-10-CM

## 2017-09-06 DIAGNOSIS — F0781 Postconcussional syndrome: Secondary | ICD-10-CM

## 2017-09-06 DIAGNOSIS — R519 Headache, unspecified: Secondary | ICD-10-CM

## 2017-09-06 MED ORDER — DIVALPROEX SODIUM ER 250 MG PO TB24: 250.0000 mg | ORAL_TABLET | Freq: Every day | ORAL | 5 refills | Status: DC

## 2017-09-06 NOTE — Progress Notes (Signed)
PATIENT: Crystal Larsen DOB: 05-20-1956  REASON FOR VISIT: follow up-post concussive syndrome HISTORY FROM: patient  HISTORY OF PRESENT ILLNESS: Today 09/06/17 Ms. Nixon is a 61 year old female with a history of postconcussive syndrome. She returns today for follow-up. Her headaches had essentially resolved until about 2-3 months ago. She states that she has 2 types of headaches. She reports that she will get an aura that consists of changes with her vision. She reports that those changes including blurry vision and the inability to focus on objects. She reports that this will last 30-45 minutes but is typically never followed by a headache. She states that it occurs approximately once every 3 weeks. She does note that fluorescent lights and bright lights are triggers for these events. She reports that she does have headaches that occur consecutively for 6-8 days a month. These are not associated with the visual changes.  These headaches are typically located in the frontal region and behind the eyes. She denies photophobia and phonophobia as well as nausea and vomiting. She states that she does feel that she has a fullness in the maxillary regions bilaterally. She is on four anti-histamines due to itching. She does not feel that the anti-histamines has made the headaches worse or better. In the past she has been on Depakote and that essentially resolved her headaches. She returns today for an evaluation.  HISTORY 07/11/16: Ms. Lievanos is a 61 year old female with a history of postconcussive syndrome- The concussion occurred 24 years ago. She was originally referred by Dr. Nolon Rod in 2015. The patient was taking Depakote for migraine- after the last visit she decreased her dose by half- taking 250 mg daily and meanwhile discontinued the medication completely.  The patient reports that she has not had any migraine headaches. She continues to notice some changes in her memory. She has trouble  remembering conversations but can recall it later on. Denies having to give up anything due to her perceived memory disturbance. She is able to complete all ADLs independently. She operates a Teacher, music without difficulty. Denies any new neurological symptoms. She returns today for an evaluation after having had 3-4 migrainous auras without any pain. She called the office and was told to take Depakote as a pain medication, not a preventive. I explained today that we should use IV Depakote for headaches, not for aura. She is here for a repeat MOCA. Mrs. Rickett reports that she sometimes misses parts of the conversation that her family members may recall. She especially mentioned an emergency room visit after which she was convinced that she was told she had muscle spasms but neither her husband nor her daughter recalls her being given a diagnosis at all.   REVIEW OF SYSTEMS: Out of a complete 14 system review of symptoms, the patient complains only of the following symptoms, and all other reviewed systems are negative.  Blurred vision, environmental allergies, headache, itching  ALLERGIES: Allergies  Allergen Reactions  . Ultram [Tramadol] Other (See Comments)    seizures  . Adhesive [Tape] Rash    bandaide  . Erythromycin Rash  . Latex Rash  . Neurontin [Gabapentin] Itching  . Sulfonamide Derivatives Rash    HOME MEDICATIONS: Outpatient Medications Prior to Visit  Medication Sig Dispense Refill  . acetaminophen (TYLENOL) 500 MG tablet Take 1,000 mg by mouth every 6 (six) hours as needed (for general aches/pain.).    [name] aspirin EC 81 MG tablet Take 81 mg by mouth at bedtime.    [name]  desvenlafaxine (PRISTIQ) 50 MG 24 hr tablet TAKE 1 TABLET BY MOUTH  DAILY 90 tablet 1  . dexamethasone (DECADRON) 1 MG tablet 2 tablets twice daily for 2 days, one tablet twice daily for 2 days, one tablet daily for 2 days. 15 tablet 0  . doxylamine, Sleep, (UNISOM) 25 MG tablet Take 25 mg by mouth at bedtime.      [name] levocetirizine (XYZAL) 5 MG tablet Take 5 mg by mouth at bedtime.    [name] levothyroxine (SYNTHROID, LEVOTHROID) 137 MCG tablet Take 137 mcg by mouth daily before breakfast.    . lisinopril (PRINIVIL,ZESTRIL) 40 MG tablet Take 40 mg by mouth daily.     [name] loratadine (CLARITIN) 10 MG tablet Take 10 mg by mouth daily.     . Melatonin 5 MG TABS Take 5 mg by mouth at bedtime.    . methocarbamol (ROBAXIN) 500 MG tablet Take 1 tablet (500 mg total) by mouth every 6 (six) hours as needed for muscle spasms. 40 tablet 3  . MINIVELLE 0.0375 MG/24HR Place 0.5 patches onto the skin 2 (two) times a week. Thursday AND Sunday    . Multiple Vitamin (MULTIVITAMIN WITH MINERALS) TABS tablet Take 1 tablet by mouth at bedtime.    . niacin (NIASPAN) 500 MG CR tablet Take 500 mg by mouth at bedtime.    . Omega-3 Fatty Acids (FISH OIL) 1000 MG CAPS Take 3,000 mg by mouth at bedtime.    [name] oxyCODONE (OXY IR/ROXICODONE) 5 MG immediate release tablet Take 1-2 tablets (5-10 mg total) by mouth every 3 (three) hours as needed for breakthrough pain. 60 tablet 0  . polyethylene glycol (MIRALAX / GLYCOLAX) packet Take 17 g by mouth daily.    . Probiotic Product (PROBIOTIC PO) Take 1 capsule by mouth daily.     . progesterone (PROMETRIUM) 100 MG capsule Take 100 mg by mouth at bedtime.     . ranitidine (ZANTAC) 150 MG tablet Take 150 mg by mouth 2 (two) times daily.    [name] triamterene-hydrochlorothiazide (MAXZIDE-25) 37.5-25 MG per tablet Take 0.5 tablets by mouth daily.      No facility-administered medications prior to visit.     PAST MEDICAL HISTORY: Past Medical History:  Diagnosis Date  . Arthritis    "left foot; left hand; right shoulder; back" (11/04/2014)  . Depression   . Elevated liver enzymes   . Gallstones   . GERD (gastroesophageal reflux disease)   . History of gout   . HTN (hypertension)   . Hypothyroidism   . IBS (irritable bowel syndrome)    ?  [name] Migraine    "maybe once/yr" (11/04/2014)  . Neuropathy  (HCC)    right leg  . Obesity   . Pancreatitis   . PONV (postoperative nausea and vomiting) 1981 X 1   only time  . Seizures ([place]) 2003    due to tramadol reaction -- takes depakote for migraine prevention  . Sleep apnea    does not use cpap  . Spondylolisthesis of lumbar region   . Unsteady gait   . Wears glasses     PAST SURGICAL HISTORY: Past Surgical History:  Procedure Laterality Date  . ANKLE GANGLION CYST EXCISION Right 1980's  . BACK SURGERY    . BREAST SURGERY     biopsy  . BUNIONECTOMY Left 2012  . CESAREAN SECTION  1986  . COLONOSCOPY W/ BIOPSIES AND POLYPECTOMY    . EYE SURGERY Bilateral 2012   "lasered a hole in my iris  cause pressure was building up"  . LAPAROSCOPIC CHOLECYSTECTOMY  1990's  . NASAL SINUS SURGERY  1980's  . POSTERIOR LUMBAR FUSION  05/2013   L4-5; S1  . POSTERIOR LUMBAR FUSION  11/04/2014   L3-4  . SHOULDER ARTHROSCOPY W/ ROTATOR CUFF REPAIR Right 2012  . TUBAL LIGATION  1992    FAMILY HISTORY: Family History  Problem Relation Age of Onset  . Asthma Mother   . Lung cancer Mother   . Heart disease Father   . Heart disease Maternal Grandfather        PGF  . Alcohol abuse Maternal Grandfather   . Hyperlipidemia Maternal Grandfather   . Anxiety disorder Sister   . Fibromyalgia Sister   . Nephrolithiasis Daughter     SOCIAL HISTORY: Social History   Social History  . Marital status: Married    Spouse name: N/A  . Number of children: 2  . Years of education: N/A   Occupational History  . retired    Social History Main Topics  . Smoking status: Never Smoker  . Smokeless tobacco: Never Used  . Alcohol use 0.6 oz/week    1 Glasses of wine per week     Comment: occasional  . Drug use: No  . Sexual activity: Not Currently   Other Topics Concern  . Not on file   Social History Narrative   Right handed. Caffeine 3 cups coffee daily, BS, Married, 2 kids.       PHYSICAL EXAM  Vitals:   09/06/17 0736  BP: (!) 149/84    Pulse: 92  Weight: 186 lb (84.4 kg)  Height: 5\' 7"  (1.702 m)   Body mass index is 29.13 kg/m.  Generalized: Well developed, in no acute distress   Neurological examination  Mentation: Alert oriented to time, place, history taking. Follows all commands speech and language fluent Cranial nerve II-XII: Pupils were equal round reactive to light. Extraocular movements were full, visual field were full on confrontational test. Facial sensation and strength were normal. Uvula tongue midline. Head turning and shoulder shrug  were normal and symmetric. Motor: The motor testing reveals 5 over 5 strength of all 4 extremities. Good symmetric motor tone is noted throughout.  Sensory: Sensory testing is intact to soft touch on all 4 extremities. No evidence of extinction is noted.  Coordination: Cerebellar testing reveals good finger-nose-finger and heel-to-shin bilaterally.  Gait and station: Gait is normal. Tandem gait is normal. Romberg is negative. No drift is seen.  Reflexes: Deep tendon reflexes are symmetric and normal bilaterally.   DIAGNOSTIC DATA (LABS, IMAGING, TESTING) - I reviewed patient records, labs, notes, testing and imaging myself where available.  Lab Results  Component Value Date   WBC 9.1 02/20/2017   HGB 10.8 (L) 02/20/2017   HCT 31.4 (L) 02/20/2017   MCV 88.5 02/20/2017   PLT 232 02/20/2017      Component Value Date/Time   NA 136 02/20/2017 0140   K 4.3 02/20/2017 0140   CL 105 02/20/2017 0140   CO2 22 02/20/2017 0140   GLUCOSE 146 (H) 02/20/2017 0140   BUN 16 02/20/2017 0140   CREATININE 0.97 02/20/2017 0140   CALCIUM 8.7 (L) 02/20/2017 0140   PROT 7.6 02/14/2017 0955   ALBUMIN 3.7 02/14/2017 0955   AST 32 02/14/2017 0955   ALT 24 02/14/2017 0955   ALKPHOS 76 02/14/2017 0955   BILITOT 0.8 02/14/2017 0955   GFRNONAA >60 02/20/2017 0140   GFRAA >60 02/20/2017 0140  ASSESSMENT AND PLAN 61 y.o. year old female  has a past medical history of Arthritis;  Depression; Elevated liver enzymes; Gallstones; GERD (gastroesophageal reflux disease); History of gout; HTN (hypertension); Hypothyroidism; IBS (irritable bowel syndrome); Migraine; Neuropathy ([place]); Obesity; Pancreatitis; PONV (postoperative nausea and vomiting) (1981 X 1); Seizures ([place]) (2003); Sleep apnea; Spondylolisthesis of lumbar region; Unsteady gait; and Wears glasses. here with:  1. Postconcussive syndrome 2. Visual disturbance 3. Headache  The patient will be restarted on Depakote extended release 250 mg daily. In the past she has been on this medication and it resolved her symptoms. I again reviewed the potential side effects of Depakote with the patient. She verbalized understanding. I did advise that if her symptoms worsen or she develops new symptoms she should let us know. She will follow-up in 6 months with Dr. Mechele Claude, MSN, NP-C 09/06/2017, 7:33 AM [facility] [address], [place], [place] [address] [phone]

## 2017-09-06 NOTE — Patient Instructions (Signed)
Your Plan:  Restart Depakote ER 250 mg daily  If your symptoms worsen or you develop new symptoms please let us know.    Thank you for coming to see [place] at [facility]. I hope we have been able to provide you high quality care today.  You may receive a patient satisfaction survey over the next few weeks. We would appreciate your feedback and comments so that we may continue to improve ourselves and the health of our patients.

## 2017-09-08 ENCOUNTER — Other Ambulatory Visit: Payer: Self-pay | Admitting: Neurology

## 2017-10-16 DIAGNOSIS — M1990 Unspecified osteoarthritis, unspecified site: Secondary | ICD-10-CM | POA: Diagnosis not present

## 2017-11-28 ENCOUNTER — Other Ambulatory Visit: Payer: Self-pay | Admitting: Neurology

## 2017-12-26 DIAGNOSIS — M25511 Pain in right shoulder: Secondary | ICD-10-CM | POA: Diagnosis not present

## 2017-12-26 DIAGNOSIS — S46811A Strain of other muscles, fascia and tendons at shoulder and upper arm level, right arm, initial encounter: Secondary | ICD-10-CM | POA: Diagnosis not present

## 2018-01-03 DIAGNOSIS — M25511 Pain in right shoulder: Secondary | ICD-10-CM | POA: Diagnosis not present

## 2018-01-09 DIAGNOSIS — M7541 Impingement syndrome of right shoulder: Secondary | ICD-10-CM | POA: Diagnosis not present

## 2018-01-09 DIAGNOSIS — S46011D Strain of muscle(s) and tendon(s) of the rotator cuff of right shoulder, subsequent encounter: Secondary | ICD-10-CM | POA: Diagnosis not present

## 2018-01-17 DIAGNOSIS — L247 Irritant contact dermatitis due to plants, except food: Secondary | ICD-10-CM | POA: Diagnosis not present

## 2018-01-17 DIAGNOSIS — L218 Other seborrheic dermatitis: Secondary | ICD-10-CM | POA: Diagnosis not present

## 2018-03-04 ENCOUNTER — Other Ambulatory Visit: Payer: Self-pay | Admitting: Adult Health

## 2018-03-11 ENCOUNTER — Ambulatory Visit: Payer: 59 | Admitting: Neurology

## 2018-03-13 DIAGNOSIS — L600 Ingrowing nail: Secondary | ICD-10-CM | POA: Diagnosis not present

## 2018-03-13 DIAGNOSIS — L03039 Cellulitis of unspecified toe: Secondary | ICD-10-CM | POA: Diagnosis not present

## 2018-03-22 DIAGNOSIS — S91209D Unspecified open wound of unspecified toe(s) with damage to nail, subsequent encounter: Secondary | ICD-10-CM | POA: Diagnosis not present

## 2018-03-27 ENCOUNTER — Ambulatory Visit: Payer: 59 | Admitting: Neurology

## 2018-03-27 ENCOUNTER — Encounter: Payer: Self-pay | Admitting: Neurology

## 2018-03-27 VITALS — BP 103/72 | HR 72 | Ht 67.0 in | Wt 191.0 lb

## 2018-03-27 DIAGNOSIS — G43911 Migraine, unspecified, intractable, with status migrainosus: Secondary | ICD-10-CM

## 2018-03-27 MED ORDER — DIVALPROEX SODIUM ER 250 MG PO TB24: 250.0000 mg | ORAL_TABLET | Freq: Every day | ORAL | 3 refills | Status: DC

## 2018-03-27 NOTE — Patient Instructions (Signed)
Valproic Acid, Divalproex Sodium delayed or extended-release tablets What is this medicine? DIVALPROEX SODIUM (dye VAL pro ex SO dee um) is used to prevent seizures caused by some forms of epilepsy. It is also used to treat bipolar mania and to prevent migraine headaches. This medicine may be used for other purposes; ask your health care provider or pharmacist if you have questions. COMMON BRAND NAME(S): Depakote, Depakote ER What should I tell my health care provider before I take this medicine? They need to know if you have any of these conditions: -if you often drink alcohol -kidney disease -liver disease -low platelet counts -mitochondrial disease -suicidal thoughts, plans, or attempt; a previous suicide attempt by you or a family member -urea cycle disorder (UCD) -an unusual or allergic reaction to divalproex sodium, sodium valproate, valproic acid, other medicines, foods, dyes, or preservatives -pregnant or trying to get pregnant -breast-feeding How should I use this medicine? Take this medicine by mouth with a drink of water. Follow the directions on the prescription label. Do not cut, crush or chew this medicine. You can take it with or without food. If it upsets your stomach, take it with food. Take your medicine at regular intervals. Do not take it more often than directed. Do not stop taking except on your doctor's advice. A special MedGuide will be given to you by the pharmacist with each prescription and refill. Be sure to read this information carefully each time. Talk to your pediatrician regarding the use of this medicine in children. While this drug may be prescribed for children as young as 10 years for selected conditions, precautions do apply. Overdosage: If you think you have taken too much of this medicine contact a poison control center or emergency room at once. NOTE: This medicine is only for you. Do not share this medicine with others. What if I miss a dose? If you  miss a dose, take it as soon as you can. If it is almost time for your next dose, take only that dose. Do not take double or extra doses. What may interact with this medicine? Do not take this medicine with any of the following medications: -sodium phenylbutyrate This medicine may also interact with the following medications: -aspirin -certain antibiotics like ertapenem, imipenem, meropenem -certain medicines for depression, anxiety, or psychotic disturbances -certain medicines for seizures like carbamazepine, clonazepam, diazepam, ethosuximide, felbamate, lamotrigine, phenobarbital, phenytoin, primidone, rufinamide, topiramate -certain medicines that treat or prevent blood clots like warfarin -cholestyramine -female hormones, like estrogens and birth control pills, patches, or rings -propofol -rifampin -ritonavir -tolbutamide -zidovudine This list may not describe all possible interactions. Give your health care provider a list of all the medicines, herbs, non-prescription drugs, or dietary supplements you use. Also tell them if you smoke, drink alcohol, or use illegal drugs. Some items may interact with your medicine. What should I watch for while using this medicine? Tell your doctor or healthcare professional if your symptoms do not get better or they start to get worse. Wear a medical ID bracelet or chain, and carry a card that describes your disease and details of your medicine and dosage times. You may get drowsy, dizzy, or have blurred vision. Do not drive, use machinery, or do anything that needs mental alertness until you know how this medicine affects you. To reduce dizzy or fainting spells, do not sit or stand up quickly, especially if you are an older patient. Alcohol can increase drowsiness and dizziness. Avoid alcoholic drinks. This medicine can make you   more sensitive to the sun. Keep out of the sun. If you cannot avoid being in the sun, wear protective clothing and use  sunscreen. Do not use sun lamps or tanning beds/booths. Patients and their families should watch out for new or worsening depression or thoughts of suicide. Also watch out for sudden changes in feelings such as feeling anxious, agitated, panicky, irritable, hostile, aggressive, impulsive, severely restless, overly excited and hyperactive, or not being able to sleep. If this happens, especially at the beginning of treatment or after a change in dose, call your health care professional. Women should inform their doctor if they wish to become pregnant or think they might be pregnant. There is a potential for serious side effects to an unborn child. Talk to your health care professional or pharmacist for more information. Women who become pregnant while using this medicine may enroll in the [place] Antiepileptic Drug Pregnancy Registry by calling 1-888-233-2334. This registry collects information about the safety of antiepileptic drug use during pregnancy. What side effects may I notice from receiving this medicine? Side effects that you should report to your doctor or health care professional as soon as possible: -allergic reactions like skin rash, itching or hives, swelling of the face, lips, or tongue -changes in vision -redness, blistering, peeling or loosening of the skin, including inside the mouth -signs and symptoms of liver injury like dark yellow or brown urine; general ill feeling or flu-like symptoms; light-colored stools; loss of appetite; nausea; right upper belly pain; unusually weak or tired; yellowing of the eyes or skin -suicidal thoughts or other mood changes -unusual bleeding or bruising Side effects that usually do not require medical attention (report to your doctor or health care professional if they continue or are bothersome): -constipation -diarrhea -dizziness -hair loss -headache -loss of appetite -weight gain This list may not describe all possible side effects. Call  your doctor for medical advice about side effects. You may report side effects to FDA at 1-800-FDA-1088. Where should I keep my medicine? Keep out of reach of children. Store at room temperature between 15 and 30 degrees C (59 and 86 degrees F). Keep container tightly closed. Throw away any unused medicine after the expiration date. NOTE: This sheet is a summary. It may not cover all possible information. If you have questions about this medicine, talk to your doctor, pharmacist, or health care provider.  2018 Elsevier/Gold Standard (2016-02-17 07:11:40)  

## 2018-03-27 NOTE — Progress Notes (Signed)
PATIENT: Crystal Larsen DOB: 06/12/1956  REASON FOR VISIT: follow up-post concussive syndrome HISTORY FROM: patient  HISTORY OF PRESENT ILLNESS: Today is  03/27/18 , and I see the patient for a RV. She is doing well.  This is good reports that she has done very well on Depakote as a headache preventative but she does have a period of 2 to 3 weeks with almost relentless headaches she woke up this time, she went to bed with son and they have meanwhile resolved she is not sure what brought them on what led to their disappearance.  Overall the patient is doing very well she is looking healthy and feeling well at this time.  I will refill her medication today I think it would be fine if we need once a year from now on.    Crystal Larsen is a 62 year old female with a history of postconcussive syndrome. She returns today for follow-up. Her headaches had essentially resolved until about 2-3 months ago. She states that she has 2 types of headaches. She reports that she will get an aura that consists of changes with her vision. She reports that those changes including blurry vision and the inability to focus on objects. She reports that this will last 30-45 minutes but is typically never followed by a headache. She states that it occurs approximately once every 3 weeks. She does note that fluorescent lights and bright lights are triggers for these events. She reports that she does have headaches that occur consecutively for 6-8 days a month. These are not associated with the visual changes.  These headaches are typically located in the frontal region and behind the eyes. She denies photophobia and phonophobia as well as nausea and vomiting. She states that she does feel that she has a fullness in the maxillary regions bilaterally. She is on four anti-histamines due to itching. She does not feel that the anti-histamines has made the headaches worse or better. In the past she has been on Depakote and that  essentially resolved her headaches. She returns today for an evaluation.  HISTORY 07/11/16: Crystal Larsen is a 62 year old female with a history of postconcussive syndrome- The concussion occurred 24 years ago. She was originally referred by Dr. Nolon Rod in 2015. The patient was taking Depakote for migraine- after the last visit she decreased her dose by half- taking 250 mg daily and meanwhile discontinued the medication completely.  The patient reports that she has not had any migraine headaches. She continues to notice some changes in her memory. She has trouble remembering conversations but can recall it later on. Denies having to give up anything due to her perceived memory disturbance. She is able to complete all ADLs independently. She operates a Teacher, music without difficulty. Denies any new neurological symptoms. She returns today for an evaluation after having had 3-4 migrainous auras without any pain. She called the office and was told to take Depakote as a pain medication, not a preventive. I explained today that we should use IV Depakote for headaches, not for aura. She is here for a repeat MOCA. Crystal Larsen reports that she sometimes misses parts of the conversation that her family members may recall. She especially mentioned an emergency room visit after which she was convinced that she was told she had muscle spasms but neither her husband nor her daughter recalls her being given a diagnosis at all.   REVIEW OF SYSTEMS: Out of a complete 14 system review of symptoms,  the patient complains only of the following symptoms, and all other reviewed systems are negative.  Blurred vision, environmental allergies, headache, itching  ALLERGIES: Allergies  Allergen Reactions  . Ultram [Tramadol] Other (See Comments)    seizures  . Adhesive [Tape] Rash    bandaide  . Erythromycin Rash  . Latex Rash  . Neurontin [Gabapentin] Itching  . Sulfonamide Derivatives Rash    HOME  MEDICATIONS: Outpatient Medications Prior to Visit  Medication Sig Dispense Refill  . acetaminophen (TYLENOL) 500 MG tablet Take 1,000 mg by mouth every 6 (six) hours as needed (for general aches/pain.).    [name] aspirin EC 81 MG tablet Take 81 mg by mouth at bedtime.    [name] desvenlafaxine (PRISTIQ) 50 MG 24 hr tablet TAKE 1 TABLET BY MOUTH  DAILY 90 tablet 3  . divalproex (DEPAKOTE ER) 250 MG 24 hr tablet TAKE 1 TABLET BY MOUTH ONCE DAILY 30 tablet 5  . doxylamine, Sleep, (UNISOM) 25 MG tablet Take 25 mg by mouth at bedtime.    . hydrOXYzine (ATARAX/VISTARIL) 25 MG tablet     . levocetirizine (XYZAL) 5 MG tablet Take 5 mg by mouth at bedtime.    [name] levothyroxine (SYNTHROID, LEVOTHROID) 137 MCG tablet Take 137 mcg by mouth daily before breakfast.    . lisinopril (PRINIVIL,ZESTRIL) 40 MG tablet Take 40 mg by mouth daily.     [name] loratadine (CLARITIN) 10 MG tablet Take 10 mg by mouth daily.     . Melatonin 5 MG TABS Take 5 mg by mouth at bedtime.    [name] MINIVELLE 0.0375 MG/24HR Place 0.5 patches onto the skin 2 (two) times a week. Thursday AND Sunday    . Multiple Vitamin (MULTIVITAMIN WITH MINERALS) TABS tablet Take 1 tablet by mouth at bedtime.    . niacin (NIASPAN) 500 MG CR tablet Take 500 mg by mouth at bedtime.    . Omega-3 Fatty Acids (FISH OIL) 1000 MG CAPS Take 3,000 mg by mouth at bedtime.    . pantoprazole (PROTONIX) 40 MG tablet pantoprazole 40 mg tablet,delayed release    . polyethylene glycol (MIRALAX / GLYCOLAX) packet Take 17 g by mouth daily.    . Probiotic Product (PROBIOTIC PO) Take 1 capsule by mouth daily.     . progesterone (PROMETRIUM) 100 MG capsule Take 100 mg by mouth at bedtime.     . triamterene-hydrochlorothiazide (MAXZIDE-25) 37.5-25 MG per tablet Take 0.5 tablets by mouth daily.      No facility-administered medications prior to visit.     PAST MEDICAL HISTORY: Past Medical History:  Diagnosis Date  . Arthritis    "left foot; left hand; right shoulder; back"  (11/04/2014)  . Depression   . Elevated liver enzymes   . Gallstones   . GERD (gastroesophageal reflux disease)   . History of gout   . HTN (hypertension)   . Hypothyroidism   . IBS (irritable bowel syndrome)    ?  [name] Migraine    "maybe once/yr" (11/04/2014)  . Neuropathy    right leg  . Obesity   . Pancreatitis   . PONV (postoperative nausea and vomiting) 1981 X 1   only time  . Seizures ([place]) 2003    due to tramadol reaction -- takes depakote for migraine prevention  . Sleep apnea    does not use cpap  . Spondylolisthesis of lumbar region   . Unsteady gait   . Wears glasses     PAST SURGICAL HISTORY: Past Surgical History:  Procedure  Laterality Date  . ANKLE GANGLION CYST EXCISION Right 1980's  . BACK SURGERY    . BREAST SURGERY     biopsy  . BUNIONECTOMY Left 2012  . CESAREAN SECTION  1986  . COLONOSCOPY W/ BIOPSIES AND POLYPECTOMY    . EYE SURGERY Bilateral 2012   "lasered a hole in my iris cause pressure was building up"  . LAPAROSCOPIC CHOLECYSTECTOMY  1990's  . NASAL SINUS SURGERY  1980's  . POSTERIOR LUMBAR FUSION  05/2013   L4-5; S1  . POSTERIOR LUMBAR FUSION  11/04/2014   L3-4  . SHOULDER ARTHROSCOPY W/ ROTATOR CUFF REPAIR Right 2012  . TUBAL LIGATION  1992    FAMILY HISTORY: Family History  Problem Relation Age of Onset  . Asthma Mother   . Lung cancer Mother   . Heart disease Father   . Heart disease Maternal Grandfather        PGF  . Alcohol abuse Maternal Grandfather   . Hyperlipidemia Maternal Grandfather   . Anxiety disorder Sister   . Fibromyalgia Sister   . Nephrolithiasis Daughter     SOCIAL HISTORY: Social History   Socioeconomic History  . Marital status: Married    Spouse name: Not on file  . Number of children: 2  . Years of education: Not on file  . Highest education level: Not on file  Occupational History  . Occupation: retired  Scientific laboratory technician  . Financial resource strain: Not on file  . Food insecurity:    Worry: Not on  file    Inability: Not on file  . Transportation needs:    Medical: Not on file    Non-medical: Not on file  Tobacco Use  . Smoking status: Never Smoker  . Smokeless tobacco: Never Used  Substance and Sexual Activity  . Alcohol use: Yes    Alcohol/week: 0.6 oz    Types: 1 Glasses of wine per week    Comment: occasional  . Drug use: No  . Sexual activity: Not Currently  Lifestyle  . Physical activity:    Days per week: Not on file    Minutes per session: Not on file  . Stress: Not on file  Relationships  . Social connections:    Talks on phone: Not on file    Gets together: Not on file    Attends religious service: Not on file    Active member of club or organization: Not on file    Attends meetings of clubs or organizations: Not on file    Relationship status: Not on file  . Intimate partner violence:    Fear of current or ex partner: Not on file    Emotionally abused: Not on file    Physically abused: Not on file    Forced sexual activity: Not on file  Other Topics Concern  . Not on file  Social History Narrative   Right handed. Caffeine 3 cups coffee daily, BS, Married, 2 kids.       PHYSICAL EXAM  Vitals:   03/27/18 1410  BP: 103/72  Pulse: 72  Weight: 191 lb (86.6 kg)  Height: 5\' 7"  (1.702 m)   Body mass index is 29.91 kg/m.  Generalized: Well developed, in no acute distress   Neurological examination: Mentation: Alert oriented to time, place, history taking. Follows all commands speech and language fluent Cranial nerve: taste and smell intact.  Pupils were equal round reactive to light. Extraocular movements were full, visual field were full on confrontational test. Facial  sensation and strength were normal. Head turning and shoulder shrug  were symmetric. There is right shoulder pain.  Motor: 5 / 5 strength of all 4 extremities,  symmetric motor tone is noted throughout.  Sensory: intact to soft touch on all 4 extremities. No evidence of extinction is  noted.  Coordination: inatct finger-nose bilaterally.  Gait and station: Gait is normal.   DIAGNOSTIC DATA (LABS, IMAGING, TESTING) - I reviewed patient records, labs, notes, testing and imaging myself where available.  Lab Results  Component Value Date   WBC 9.1 02/20/2017   HGB 10.8 (L) 02/20/2017   HCT 31.4 (L) 02/20/2017   MCV 88.5 02/20/2017   PLT 232 02/20/2017      Component Value Date/Time   NA 136 02/20/2017 0140   K 4.3 02/20/2017 0140   CL 105 02/20/2017 0140   CO2 22 02/20/2017 0140   GLUCOSE 146 (H) 02/20/2017 0140   BUN 16 02/20/2017 0140   CREATININE 0.97 02/20/2017 0140   CALCIUM 8.7 (L) 02/20/2017 0140   PROT 7.6 02/14/2017 0955   ALBUMIN 3.7 02/14/2017 0955   AST 32 02/14/2017 0955   ALT 24 02/14/2017 0955   ALKPHOS 76 02/14/2017 0955   BILITOT 0.8 02/14/2017 0955   GFRNONAA >60 02/20/2017 0140   GFRAA >60 02/20/2017 0140     ASSESSMENT AND PLAN 62 y.o. year old female  has a past medical history of Arthritis, Depression, Elevated liver enzymes, Gallstones, GERD (gastroesophageal reflux disease), History of gout, HTN (hypertension), Hypothyroidism, IBS (irritable bowel syndrome), Migraine, Neuropathy, Obesity, Pancreatitis, PONV (postoperative nausea and vomiting) (1981 X 1), Seizures ([place]) (2003), Sleep apnea, Spondylolisthesis of lumbar region, Unsteady gait, and Wears glasses. here with:  1. Postconcussive syndrome, recovered  2. Visual disturbance, recovered  3. Headache, much improved on Depakote.   The patient will continue on Depakote extended release 250 mg daily. In the past she has been on this medication and it resolved her symptoms. I again reviewed the potential side effects of Depakote with the patient. She is willing to follow once a year.    Larey Seat, MD  03/27/2018, 2:58 PM [facility] [address], [place], [place] [address] [phone]

## 2018-03-28 ENCOUNTER — Telehealth: Payer: Self-pay | Admitting: Neurology

## 2018-03-28 LAB — COMPREHENSIVE METABOLIC PANEL
ALBUMIN: 4 g/dL (ref 3.6–4.8)
ALK PHOS: 87 IU/L (ref 39–117)
ALT: 20 IU/L (ref 0–32)
AST: 30 IU/L (ref 0–40)
Albumin/Globulin Ratio: 1.1 — ABNORMAL LOW (ref 1.2–2.2)
BILIRUBIN TOTAL: 0.3 mg/dL (ref 0.0–1.2)
BUN/Creatinine Ratio: 19 (ref 12–28)
BUN: 21 mg/dL (ref 8–27)
CHLORIDE: 102 mmol/L (ref 96–106)
CO2: 24 mmol/L (ref 20–29)
Calcium: 9.6 mg/dL (ref 8.7–10.3)
Creatinine, Ser: 1.1 mg/dL — ABNORMAL HIGH (ref 0.57–1.00)
GFR calc Af Amer: 62 mL/min/{1.73_m2} (ref >59)
GFR calc non Af Amer: 54 mL/min/{1.73_m2} — ABNORMAL LOW (ref >59)
GLUCOSE: 101 mg/dL — AB (ref 65–99)
Globulin, Total: 3.5 g/dL (ref 1.5–4.5)
Potassium: 4.2 mmol/L (ref 3.5–5.2)
Sodium: 138 mmol/L (ref 134–144)
Total Protein: 7.5 g/dL (ref 6.0–8.5)

## 2018-03-28 NOTE — Telephone Encounter (Signed)
-----   Message from Larey Seat, MD sent at 03/28/2018 12:28 PM EDT ----- Patient has progressivly rising creatine levels. Share with PCP. No change in neurological medical managment needed.

## 2018-03-28 NOTE — Telephone Encounter (Signed)
Called the patient to make her aware of lab work. No answer. LVM for the pt to call back.

## 2018-03-29 NOTE — Telephone Encounter (Signed)
Pt called back and I was able to review the lab work with her. Pt has an upcoming apt with her PCP and will bring to her attention. Pt verbalized understanding and was appreciative for the results.

## 2018-04-08 DIAGNOSIS — Z79899 Other long term (current) drug therapy: Secondary | ICD-10-CM | POA: Diagnosis not present

## 2018-04-08 DIAGNOSIS — I10 Essential (primary) hypertension: Secondary | ICD-10-CM | POA: Diagnosis not present

## 2018-04-08 DIAGNOSIS — Z0000 Encounter for general adult medical examination without abnormal findings: Secondary | ICD-10-CM | POA: Diagnosis not present

## 2018-04-08 DIAGNOSIS — E039 Hypothyroidism, unspecified: Secondary | ICD-10-CM | POA: Diagnosis not present

## 2018-04-08 DIAGNOSIS — N183 Chronic kidney disease, stage 3 (moderate): Secondary | ICD-10-CM | POA: Diagnosis not present

## 2018-05-17 DIAGNOSIS — Z1231 Encounter for screening mammogram for malignant neoplasm of breast: Secondary | ICD-10-CM | POA: Diagnosis not present

## 2018-05-28 DIAGNOSIS — E039 Hypothyroidism, unspecified: Secondary | ICD-10-CM | POA: Diagnosis not present

## 2018-06-17 DIAGNOSIS — H35361 Drusen (degenerative) of macula, right eye: Secondary | ICD-10-CM | POA: Diagnosis not present

## 2018-08-02 DIAGNOSIS — Z780 Asymptomatic menopausal state: Secondary | ICD-10-CM | POA: Diagnosis not present

## 2018-08-02 DIAGNOSIS — Z01419 Encounter for gynecological examination (general) (routine) without abnormal findings: Secondary | ICD-10-CM | POA: Diagnosis not present

## 2018-08-02 DIAGNOSIS — Z6829 Body mass index (BMI) 29.0-29.9, adult: Secondary | ICD-10-CM | POA: Diagnosis not present

## 2018-09-30 ENCOUNTER — Other Ambulatory Visit: Payer: Self-pay | Admitting: Neurology

## 2018-09-30 DIAGNOSIS — I10 Essential (primary) hypertension: Secondary | ICD-10-CM | POA: Diagnosis not present

## 2018-09-30 DIAGNOSIS — L602 Onychogryphosis: Secondary | ICD-10-CM | POA: Diagnosis not present

## 2018-09-30 DIAGNOSIS — L299 Pruritus, unspecified: Secondary | ICD-10-CM | POA: Diagnosis not present

## 2018-09-30 MED ORDER — DIVALPROEX SODIUM ER 250 MG PO TB24: 250.0000 mg | ORAL_TABLET | Freq: Every day | ORAL | 1 refills | Status: DC

## 2018-11-06 DIAGNOSIS — I10 Essential (primary) hypertension: Secondary | ICD-10-CM | POA: Diagnosis not present

## 2018-12-13 ENCOUNTER — Other Ambulatory Visit: Payer: Self-pay | Admitting: Neurology

## 2018-12-20 ENCOUNTER — Other Ambulatory Visit: Payer: Self-pay | Admitting: Family Medicine

## 2018-12-20 ENCOUNTER — Ambulatory Visit
Admission: RE | Admit: 2018-12-20 | Discharge: 2018-12-20 | Disposition: A | Discharge status: Home / Self Care | Payer: Commercial Managed Care - HMO | Source: Ambulatory Visit | Attending: Family Medicine | Admitting: Family Medicine

## 2018-12-20 DIAGNOSIS — M1812 Unilateral primary osteoarthritis of first carpometacarpal joint, left hand: Secondary | ICD-10-CM | POA: Diagnosis not present

## 2018-12-20 DIAGNOSIS — M79641 Pain in right hand: Secondary | ICD-10-CM | POA: Diagnosis not present

## 2018-12-20 DIAGNOSIS — R52 Pain, unspecified: Secondary | ICD-10-CM

## 2019-02-10 DIAGNOSIS — E039 Hypothyroidism, unspecified: Secondary | ICD-10-CM | POA: Diagnosis not present

## 2019-02-10 DIAGNOSIS — R5383 Other fatigue: Secondary | ICD-10-CM | POA: Diagnosis not present

## 2019-02-10 DIAGNOSIS — I10 Essential (primary) hypertension: Secondary | ICD-10-CM | POA: Diagnosis not present

## 2019-02-19 DIAGNOSIS — R200 Anesthesia of skin: Secondary | ICD-10-CM | POA: Diagnosis not present

## 2019-02-19 DIAGNOSIS — M542 Cervicalgia: Secondary | ICD-10-CM | POA: Diagnosis not present

## 2019-02-19 DIAGNOSIS — M79644 Pain in right finger(s): Secondary | ICD-10-CM | POA: Diagnosis not present

## 2019-02-19 DIAGNOSIS — M180 Bilateral primary osteoarthritis of first carpometacarpal joints: Secondary | ICD-10-CM | POA: Diagnosis not present

## 2019-02-21 DIAGNOSIS — M180 Bilateral primary osteoarthritis of first carpometacarpal joints: Secondary | ICD-10-CM | POA: Diagnosis not present

## 2019-02-21 DIAGNOSIS — M79645 Pain in left finger(s): Secondary | ICD-10-CM | POA: Diagnosis not present

## 2019-02-28 ENCOUNTER — Other Ambulatory Visit: Payer: Self-pay | Admitting: Neurology

## 2019-03-12 ENCOUNTER — Encounter: Payer: Self-pay | Admitting: *Deleted

## 2019-03-12 ENCOUNTER — Telehealth: Payer: Self-pay | Admitting: *Deleted

## 2019-03-12 NOTE — Telephone Encounter (Signed)
Called pt to r/s appt from MM/NP due to maternity leave to [name], NP 03-27-19 at 0900 consented to VV webex / Iphone.  Due to current COVID 19 pandemic, our office is severely reducing in office visits for at least the next 2 weeks, in order to minimize the risk to our patients and healthcare providers.  Pt understands that although there may be some limitations with this type of visit, we will take all precautions to reduce any security or privacy concerns.  Pt understands that this will be treated like an in office visit and we will file with pt's insurance. Pt's email is [email or web address]. Pt understands that the [employer] webex software must be downloaded and operational on the device pt plans to use for the visit.  Updated chart.

## 2019-03-13 DIAGNOSIS — K146 Glossodynia: Secondary | ICD-10-CM | POA: Diagnosis not present

## 2019-03-17 DIAGNOSIS — E039 Hypothyroidism, unspecified: Secondary | ICD-10-CM | POA: Diagnosis not present

## 2019-03-27 ENCOUNTER — Ambulatory Visit: Payer: Self-pay | Admitting: Family Medicine

## 2019-03-27 DIAGNOSIS — J029 Acute pharyngitis, unspecified: Secondary | ICD-10-CM | POA: Diagnosis not present

## 2019-03-31 ENCOUNTER — Ambulatory Visit: Payer: 59 | Admitting: Adult Health

## 2019-04-01 ENCOUNTER — Encounter (HOSPITAL_COMMUNITY): Payer: Self-pay | Admitting: Emergency Medicine

## 2019-04-01 ENCOUNTER — Other Ambulatory Visit: Payer: Self-pay

## 2019-04-01 ENCOUNTER — Observation Stay (HOSPITAL_COMMUNITY)
Admission: EM | Admit: 2019-04-01 | Discharge: 2019-04-03 | Disposition: A | Discharge status: Home / Self Care | Payer: 59 | Attending: Internal Medicine | Admitting: Internal Medicine

## 2019-04-01 DIAGNOSIS — E2740 Unspecified adrenocortical insufficiency: Secondary | ICD-10-CM | POA: Insufficient documentation

## 2019-04-01 DIAGNOSIS — E669 Obesity, unspecified: Secondary | ICD-10-CM | POA: Diagnosis not present

## 2019-04-01 DIAGNOSIS — F329 Major depressive disorder, single episode, unspecified: Secondary | ICD-10-CM | POA: Diagnosis not present

## 2019-04-01 DIAGNOSIS — M109 Gout, unspecified: Secondary | ICD-10-CM | POA: Insufficient documentation

## 2019-04-01 DIAGNOSIS — E871 Hypo-osmolality and hyponatremia: Secondary | ICD-10-CM | POA: Diagnosis present

## 2019-04-01 DIAGNOSIS — N183 Chronic kidney disease, stage 3 (moderate): Secondary | ICD-10-CM | POA: Insufficient documentation

## 2019-04-01 DIAGNOSIS — K219 Gastro-esophageal reflux disease without esophagitis: Secondary | ICD-10-CM | POA: Diagnosis present

## 2019-04-01 DIAGNOSIS — D649 Anemia, unspecified: Secondary | ICD-10-CM | POA: Diagnosis not present

## 2019-04-01 DIAGNOSIS — E2749 Other adrenocortical insufficiency: Secondary | ICD-10-CM | POA: Diagnosis not present

## 2019-04-01 DIAGNOSIS — E872 Acidosis, unspecified: Secondary | ICD-10-CM | POA: Diagnosis present

## 2019-04-01 DIAGNOSIS — Z881 Allergy status to other antibiotic agents status: Secondary | ICD-10-CM | POA: Insufficient documentation

## 2019-04-01 DIAGNOSIS — Z8249 Family history of ischemic heart disease and other diseases of the circulatory system: Secondary | ICD-10-CM | POA: Diagnosis not present

## 2019-04-01 DIAGNOSIS — Z79899 Other long term (current) drug therapy: Secondary | ICD-10-CM | POA: Insufficient documentation

## 2019-04-01 DIAGNOSIS — R7989 Other specified abnormal findings of blood chemistry: Secondary | ICD-10-CM | POA: Diagnosis present

## 2019-04-01 DIAGNOSIS — Z885 Allergy status to narcotic agent status: Secondary | ICD-10-CM | POA: Insufficient documentation

## 2019-04-01 DIAGNOSIS — I129 Hypertensive chronic kidney disease with stage 1 through stage 4 chronic kidney disease, or unspecified chronic kidney disease: Secondary | ICD-10-CM | POA: Insufficient documentation

## 2019-04-01 DIAGNOSIS — Z888 Allergy status to other drugs, medicaments and biological substances status: Secondary | ICD-10-CM | POA: Insufficient documentation

## 2019-04-01 DIAGNOSIS — K581 Irritable bowel syndrome with constipation: Secondary | ICD-10-CM | POA: Diagnosis not present

## 2019-04-01 DIAGNOSIS — Z882 Allergy status to sulfonamides status: Secondary | ICD-10-CM | POA: Insufficient documentation

## 2019-04-01 DIAGNOSIS — Z6825 Body mass index (BMI) 25.0-25.9, adult: Secondary | ICD-10-CM | POA: Diagnosis not present

## 2019-04-01 DIAGNOSIS — Z79890 Hormone replacement therapy (postmenopausal): Secondary | ICD-10-CM | POA: Insufficient documentation

## 2019-04-01 DIAGNOSIS — Z03818 Encounter for observation for suspected exposure to other biological agents ruled out: Secondary | ICD-10-CM | POA: Diagnosis not present

## 2019-04-01 DIAGNOSIS — G4730 Sleep apnea, unspecified: Secondary | ICD-10-CM | POA: Diagnosis not present

## 2019-04-01 DIAGNOSIS — Z7982 Long term (current) use of aspirin: Secondary | ICD-10-CM | POA: Diagnosis not present

## 2019-04-01 DIAGNOSIS — N179 Acute kidney failure, unspecified: Secondary | ICD-10-CM | POA: Diagnosis present

## 2019-04-01 DIAGNOSIS — R682 Dry mouth, unspecified: Secondary | ICD-10-CM | POA: Diagnosis not present

## 2019-04-01 DIAGNOSIS — G629 Polyneuropathy, unspecified: Secondary | ICD-10-CM | POA: Insufficient documentation

## 2019-04-01 DIAGNOSIS — Z91040 Latex allergy status: Secondary | ICD-10-CM | POA: Insufficient documentation

## 2019-04-01 DIAGNOSIS — R569 Unspecified convulsions: Secondary | ICD-10-CM | POA: Diagnosis not present

## 2019-04-01 DIAGNOSIS — R946 Abnormal results of thyroid function studies: Secondary | ICD-10-CM | POA: Diagnosis not present

## 2019-04-01 DIAGNOSIS — Z20828 Contact with and (suspected) exposure to other viral communicable diseases: Secondary | ICD-10-CM | POA: Diagnosis not present

## 2019-04-01 DIAGNOSIS — I10 Essential (primary) hypertension: Secondary | ICD-10-CM | POA: Diagnosis present

## 2019-04-01 DIAGNOSIS — J029 Acute pharyngitis, unspecified: Secondary | ICD-10-CM | POA: Diagnosis not present

## 2019-04-01 DIAGNOSIS — E039 Hypothyroidism, unspecified: Secondary | ICD-10-CM | POA: Diagnosis not present

## 2019-04-01 HISTORY — DX: Acute kidney failure, unspecified: N17.9

## 2019-04-01 HISTORY — DX: Acidosis: E87.2

## 2019-04-01 HISTORY — DX: Acidosis, unspecified: E87.20

## 2019-04-01 LAB — BASIC METABOLIC PANEL
Anion gap: 7 (ref 5–15)
BUN: 33 mg/dL — ABNORMAL HIGH (ref 8–23)
CO2: 22 mmol/L (ref 22–32)
Calcium: 9.6 mg/dL (ref 8.9–10.3)
Chloride: 99 mmol/L (ref 98–111)
Creatinine, Ser: 1.8 mg/dL — ABNORMAL HIGH (ref 0.44–1.00)
GFR calc Af Amer: 34 mL/min — ABNORMAL LOW (ref >60)
GFR calc non Af Amer: 29 mL/min — ABNORMAL LOW (ref >60)
Glucose, Bld: 105 mg/dL — ABNORMAL HIGH (ref 70–99)
Potassium: 4.9 mmol/L (ref 3.5–5.1)
Sodium: 128 mmol/L — ABNORMAL LOW (ref 135–145)

## 2019-04-01 LAB — CBC WITH DIFFERENTIAL/PLATELET
Abs Immature Granulocytes: 0.17 10*3/uL — ABNORMAL HIGH (ref 0.00–0.07)
Basophils Absolute: 0 10*3/uL (ref 0.0–0.1)
Basophils Relative: 0 %
Eosinophils Absolute: 0.1 10*3/uL (ref 0.0–0.5)
Eosinophils Relative: 1 %
HCT: 30.2 % — ABNORMAL LOW (ref 36.0–46.0)
Hemoglobin: 10.3 g/dL — ABNORMAL LOW (ref 12.0–15.0)
Immature Granulocytes: 4 %
Lymphocytes Relative: 19 %
Lymphs Abs: 0.8 10*3/uL (ref 0.7–4.0)
MCH: 30.7 pg (ref 26.0–34.0)
MCHC: 34.1 g/dL (ref 30.0–36.0)
MCV: 90.1 fL (ref 80.0–100.0)
Monocytes Absolute: 0.2 10*3/uL (ref 0.1–1.0)
Monocytes Relative: 4 %
Neutro Abs: 2.8 10*3/uL (ref 1.7–7.7)
Neutrophils Relative %: 72 %
Platelets: 394 10*3/uL (ref 150–400)
RBC: 3.35 MIL/uL — ABNORMAL LOW (ref 3.87–5.11)
RDW: 16.8 % — ABNORMAL HIGH (ref 11.5–15.5)
WBC: 4 10*3/uL (ref 4.0–10.5)
nRBC: 0 % (ref 0.0–0.2)

## 2019-04-01 LAB — URINALYSIS, ROUTINE W REFLEX MICROSCOPIC
Bacteria, UA: NONE SEEN
Bilirubin Urine: NEGATIVE
Glucose, UA: NEGATIVE mg/dL
Ketones, ur: NEGATIVE mg/dL
Leukocytes,Ua: NEGATIVE
Nitrite: NEGATIVE
Protein, ur: NEGATIVE mg/dL
Specific Gravity, Urine: 1.018 (ref 1.005–1.030)
pH: 5 (ref 5.0–8.0)

## 2019-04-01 LAB — SARS CORONAVIRUS 2 BY RT PCR (HOSPITAL ORDER, PERFORMED IN ~~LOC~~ HOSPITAL LAB): SARS Coronavirus 2: NEGATIVE

## 2019-04-01 LAB — T4, FREE: Free T4: 1.07 ng/dL (ref 0.82–1.77)

## 2019-04-01 LAB — TSH: TSH: 13.506 u[IU]/mL — ABNORMAL HIGH (ref 0.350–4.500)

## 2019-04-01 MED ORDER — POLYETHYLENE GLYCOL 3350 17 G PO PACK
17.0000 g | PACK | Freq: Every day | ORAL | Status: DC
  Administered 2019-04-02 – 2019-04-03 (×2): 17 g via ORAL
  Filled 2019-04-01 (×2): qty 1

## 2019-04-01 MED ORDER — SODIUM CHLORIDE 0.9 % IV BOLUS
1000.0000 mL | Freq: Once | INTRAVENOUS | Status: AC
  Administered 2019-04-01: 1000 mL via INTRAVENOUS

## 2019-04-01 MED ORDER — NIACIN ER (ANTIHYPERLIPIDEMIC) 500 MG PO TBCR
500.0000 mg | EXTENDED_RELEASE_TABLET | Freq: Every day | ORAL | Status: DC
  Administered 2019-04-02 (×2): 500 mg via ORAL
  Filled 2019-04-01 (×4): qty 1

## 2019-04-01 MED ORDER — HYDROXYZINE HCL 25 MG PO TABS
25.0000 mg | ORAL_TABLET | Freq: Two times a day (BID) | ORAL | Status: DC
  Administered 2019-04-02 – 2019-04-03 (×3): 25 mg via ORAL
  Filled 2019-04-01 (×3): qty 1

## 2019-04-01 MED ORDER — DIVALPROEX SODIUM ER 250 MG PO TB24
250.0000 mg | ORAL_TABLET | Freq: Every day | ORAL | Status: DC
  Administered 2019-04-02 – 2019-04-03 (×2): 250 mg via ORAL
  Filled 2019-04-01 (×2): qty 1

## 2019-04-01 MED ORDER — DOXYLAMINE SUCCINATE (SLEEP) 25 MG PO TABS
25.0000 mg | ORAL_TABLET | Freq: Every day | ORAL | Status: DC
  Administered 2019-04-02: 25 mg via ORAL
  Filled 2019-04-01 (×2): qty 1

## 2019-04-01 MED ORDER — ACETAMINOPHEN 500 MG PO TABS: 1000.0000 mg | ORAL_TABLET | Freq: Four times a day (QID) | ORAL | Status: DC | PRN

## 2019-04-01 MED ORDER — LEVOCETIRIZINE DIHYDROCHLORIDE 5 MG PO TABS: 5.0000 mg | ORAL_TABLET | Freq: Every day | ORAL | Status: DC

## 2019-04-01 MED ORDER — LORATADINE 10 MG PO TABS
10.0000 mg | ORAL_TABLET | Freq: Every day | ORAL | Status: DC
  Administered 2019-04-02 – 2019-04-03 (×2): 10 mg via ORAL
  Filled 2019-04-01 (×2): qty 1

## 2019-04-01 MED ORDER — PROGESTERONE MICRONIZED 100 MG PO CAPS
100.0000 mg | ORAL_CAPSULE | Freq: Every day | ORAL | Status: DC
  Administered 2019-04-02: 100 mg via ORAL
  Filled 2019-04-01 (×2): qty 1

## 2019-04-01 MED ORDER — PANTOPRAZOLE SODIUM 40 MG PO TBEC
40.0000 mg | DELAYED_RELEASE_TABLET | Freq: Every day | ORAL | Status: DC
  Administered 2019-04-02 – 2019-04-03 (×2): 40 mg via ORAL
  Filled 2019-04-01 (×2): qty 1

## 2019-04-01 MED ORDER — LEVOTHYROXINE SODIUM 75 MCG PO TABS
175.0000 ug | ORAL_TABLET | Freq: Every day | ORAL | Status: DC
  Administered 2019-04-02 – 2019-04-03 (×2): 175 ug via ORAL
  Filled 2019-04-01 (×2): qty 1

## 2019-04-01 MED ORDER — HYDRALAZINE HCL 20 MG/ML IJ SOLN
5.0000 mg | INTRAMUSCULAR | Status: DC | PRN
  Administered 2019-04-02: 5 mg via INTRAVENOUS
  Filled 2019-04-01: qty 1

## 2019-04-01 MED ORDER — MELATONIN 3 MG PO TABS
3.0000 mg | ORAL_TABLET | Freq: Every day | ORAL | Status: DC
  Administered 2019-04-02: 3 mg via ORAL
  Filled 2019-04-01 (×2): qty 1

## 2019-04-01 MED ORDER — HYDROCORTISONE NA SUCCINATE PF 100 MG IJ SOLR
50.0000 mg | Freq: Four times a day (QID) | INTRAMUSCULAR | Status: DC
  Administered 2019-04-02 (×2): 50 mg via INTRAVENOUS
  Filled 2019-04-01 (×2): qty 2

## 2019-04-01 MED ORDER — HYDROCORTISONE NA SUCCINATE PF 100 MG IJ SOLR
100.0000 mg | Freq: Once | INTRAMUSCULAR | Status: AC
  Administered 2019-04-02: 100 mg via INTRAVENOUS
  Filled 2019-04-01: qty 2

## 2019-04-01 MED ORDER — VENLAFAXINE HCL ER 75 MG PO CP24
75.0000 mg | ORAL_CAPSULE | Freq: Every day | ORAL | Status: DC
  Administered 2019-04-02 – 2019-04-03 (×2): 75 mg via ORAL
  Filled 2019-04-01 (×3): qty 1

## 2019-04-01 MED ORDER — ENOXAPARIN SODIUM 40 MG/0.4ML ~~LOC~~ SOLN
40.0000 mg | SUBCUTANEOUS | Status: DC
  Administered 2019-04-02 – 2019-04-03 (×2): 40 mg via SUBCUTANEOUS
  Filled 2019-04-01 (×2): qty 0.4

## 2019-04-01 MED ORDER — ASPIRIN EC 81 MG PO TBEC
81.0000 mg | DELAYED_RELEASE_TABLET | Freq: Every day | ORAL | Status: DC
  Administered 2019-04-02 (×2): 81 mg via ORAL
  Filled 2019-04-01 (×2): qty 1

## 2019-04-01 NOTE — ED Triage Notes (Signed)
Pt states she has had a sore throat for 5 weeks, lost weight, no appetite. Pt went to PCP today and had blood work drawn. Pt received phone call from her PCP that she was in adrenal crisis, and she needed to come to the ED.

## 2019-04-01 NOTE — ED Notes (Signed)
ED Provider at bedside. 

## 2019-04-01 NOTE — ED Notes (Signed)
Admitting Provider at bedside. 

## 2019-04-01 NOTE — ED Provider Notes (Signed)
[facility] Provider Note   CSN: [id number] Arrival date & time: 04/01/19  1734    History   Chief Complaint Chief Complaint  Patient presents with  . Sore Throat    HPI Crystal Larsen is a 63 y.o. female with HTN, hypothyroidism, chronic back pain, chronic itching on antihistamines and hormone replacement therapy presenting to the ED with sore throat, fatigue, weight loss and decreased appetite. She states these symptoms have been ongoing for about the past 4 weeks. She also noticed sores in her mouth and her PCP related these and her sore throat to chronic antihistamine use for itching. She was evaluated in the office today and had some bloodwork done. She was called later by her PCP that she was in adrenal crisis and to report to the ED.   She reports she has lost about 15 pounds in the last 3 weeks. She noticed a decrease in appetite and worsening fatigue. She has chronic constipation from opiate use for back pain but uses miralax and has bowel movements about every other day. She has increased sensation in her hands, mostly in bilateral index fingers, where she says any type of twisting movement causes severe pain. She is supposed to have a nerve conduction study done at [facility] for this. She has chronic aches in bilateral legs. Denies any new sensory changes or numbness. Denies abdominal pain, n/v, chest pain, SOB, fevers, recent illnesses or changes in medications. She does report at times she feels that her heart is racing. She also feels that her skin is dry. She also states she has been trying to wean off of her hormonal replacement patch but this has caused problems with her thyroid disease. She has attempted to wean off of this three times. She started a gradual taper in January from two patches a week, to one patch a week and is currently on half a patch a week.      HPI  Past Medical History:  Diagnosis Date  . Arthritis    "left foot;  left hand; right shoulder; back" (11/04/2014)  . Depression   . Elevated liver enzymes   . Gallstones   . GERD (gastroesophageal reflux disease)   . History of gout   . HTN (hypertension)   . Hypothyroidism   . IBS (irritable bowel syndrome)    ?  [name] Migraine    "maybe once/yr" (11/04/2014)  . Neuropathy    right leg  . Obesity   . Pancreatitis   . PONV (postoperative nausea and vomiting) 1981 X 1   only time  . Seizures ([place]) 2003    due to tramadol reaction -- takes depakote for migraine prevention  . Sleep apnea    does not use cpap  . Spondylolisthesis of lumbar region   . Unsteady gait   . Wears glasses     Patient Active Problem List   Diagnosis Date Noted  . Lumbar stenosis with neurogenic claudication 02/19/2017  . Migraine with aura and without status migrainosus, not intractable 07/11/2016  . Spondylolisthesis of lumbar region 11/04/2014  . Postconcussional syndrome 09/30/2013  . Post-concussion headache 09/30/2013  . Esophageal reflux 10/11/2011  . Benign neoplasm of colon 10/11/2011  . Gastritis, chronic 10/11/2011  . Guaiac + stool 09/28/2011  . IBS (irritable bowel syndrome) 09/28/2011  . GERD (gastroesophageal reflux disease) 09/28/2011  . Migraine syndrome 09/28/2011    Past Surgical History:  Procedure Laterality Date  . ANKLE GANGLION CYST EXCISION Right 1980's  .  BACK SURGERY    . BREAST SURGERY     biopsy  . BUNIONECTOMY Left 2012  . CESAREAN SECTION  1986  . COLONOSCOPY W/ BIOPSIES AND POLYPECTOMY    . EYE SURGERY Bilateral 2012   "lasered a hole in my iris cause pressure was building up"  . LAPAROSCOPIC CHOLECYSTECTOMY  1990's  . NASAL SINUS SURGERY  1980's  . POSTERIOR LUMBAR FUSION  05/2013   L4-5; S1  . POSTERIOR LUMBAR FUSION  11/04/2014   L3-4  . SHOULDER ARTHROSCOPY W/ ROTATOR CUFF REPAIR Right 2012  . TUBAL LIGATION  1992     OB History   No obstetric history on file.      Home Medications    Prior to Admission  medications   Medication Sig Start Date End Date Taking? Authorizing Provider  acetaminophen (TYLENOL) 500 MG tablet Take 1,000 mg by mouth every 6 (six) hours as needed (for general aches/pain.).    [provider]  aspirin EC 81 MG tablet Take 81 mg by mouth at bedtime.    [provider]  desvenlafaxine (PRISTIQ) 50 MG 24 hr tablet TAKE 1 TABLET BY MOUTH  DAILY 12/13/18   Dohmeier, Asencion Partridge, MD  divalproex (DEPAKOTE ER) 250 MG 24 hr tablet TAKE 1 TABLET BY MOUTH  DAILY 03/03/19   Dohmeier, Asencion Partridge, MD  doxylamine, Sleep, (UNISOM) 25 MG tablet Take 25 mg by mouth at bedtime.    [provider]  hydrOXYzine (ATARAX/VISTARIL) 25 MG tablet Take 25 mg by mouth 2 (two) times daily.  02/09/18   [provider]  levocetirizine (XYZAL) 5 MG tablet Take 5 mg by mouth at bedtime.    [provider]  levothyroxine (SYNTHROID, LEVOTHROID) 150 MCG tablet TAKE 1 TABLET BY MOUTH ONCE DAILY IN THE MORNING ON AN EMPTY STOMACH FOR 30 DAYS (DOSAGE CHANGE) 02/17/19   [provider]  lisinopril (PRINIVIL,ZESTRIL) 40 MG tablet Take 40 mg by mouth daily.  03/23/16   [provider]  loratadine (CLARITIN) 10 MG tablet Take 10 mg by mouth daily.     [provider]  Melatonin 5 MG TABS Take 5 mg by mouth at bedtime.    [provider]  MINIVELLE 0.0375 MG/24HR Place 0.5 patches onto the skin 2 (two) times a week.  09/23/14   [provider]  niacin (NIASPAN) 500 MG CR tablet Take 500 mg by mouth at bedtime.    [provider]  Omega-3 Fatty Acids (FISH OIL) 1000 MG CAPS Take 1,000 mg by mouth at bedtime.     [provider]  pantoprazole (PROTONIX) 40 MG tablet pantoprazole 40 mg tablet,delayed release    [provider]  polyethylene glycol (MIRALAX / GLYCOLAX) packet Take 17 g by mouth daily.    [provider]  Probiotic Product (PROBIOTIC PO) Take 1 capsule by mouth daily.     [provider]   progesterone (PROMETRIUM) 100 MG capsule Take 100 mg by mouth at bedtime.     [provider]  triamterene-hydrochlorothiazide (MAXZIDE-25) 37.5-25 MG per tablet Take 0.5 tablets by mouth daily.  09/27/11   [provider]    Family History Family History  Problem Relation Age of Onset  . Asthma Mother   . Lung cancer Mother   . Heart disease Father   . Heart disease Maternal Grandfather        PGF  . Alcohol abuse Maternal Grandfather   . Hyperlipidemia Maternal Grandfather   . Anxiety disorder Sister   .  Fibromyalgia Sister   . Nephrolithiasis Daughter     Social History Social History   Tobacco Use  . Smoking status: Never Smoker  . Smokeless tobacco: Never Used  Substance Use Topics  . Alcohol use: Yes    Alcohol/week: 1.0 standard drinks    Types: 1 Glasses of wine per week    Comment: occasional  . Drug use: No     Allergies   Ultram [tramadol]; Adhesive [tape]; Erythromycin; Latex; Neurontin [gabapentin]; and Sulfonamide derivatives   Review of Systems Review of Systems  Constitutional: Positive for activity change, appetite change, fatigue and unexpected weight change. Negative for chills, diaphoresis and fever.  HENT: Positive for mouth sores and sore throat. Negative for congestion.   Eyes: Negative for visual disturbance.  Respiratory: Positive for cough. Negative for chest tightness and shortness of breath.   Cardiovascular: Negative for chest pain.  Gastrointestinal: Positive for constipation. Negative for abdominal pain, diarrhea, nausea and vomiting.  Genitourinary: Positive for difficulty urinating.  Neurological: Positive for weakness. Negative for dizziness.     Physical Exam Updated Vital Signs BP (!) 145/84   Pulse 92   Temp 98.3 F (36.8 C) (Oral)   Resp 17   Ht 5\' 7"  (1.702 m)   Wt 74.8 kg   SpO2 100%   BMI 25.84 kg/m   Physical Exam Constitutional:      Appearance: She is well-developed.  HENT:      Mouth/Throat:     Mouth: Mucous membranes are moist. No oral lesions.     Pharynx: No pharyngeal swelling.  Neck:     Thyroid: No thyromegaly.  Cardiovascular:     Rate and Rhythm: Normal rate and regular rhythm.     Heart sounds: Normal heart sounds.  Pulmonary:     Effort: Pulmonary effort is normal. No respiratory distress.     Breath sounds: Normal breath sounds. No wheezing or rales.  Abdominal:     General: Bowel sounds are normal. There is no distension.     Palpations: Abdomen is soft.     Tenderness: There is no abdominal tenderness.  Skin:    General: Skin is warm and dry.  Neurological:     Mental Status: She is alert and oriented to person, place, and time.  Psychiatric:        Mood and Affect: Mood normal.        Behavior: Behavior normal.      ED Treatments / Results  Labs (all labs ordered are listed, but only abnormal results are displayed) Labs Reviewed  CBC WITH DIFFERENTIAL/PLATELET - Abnormal; Notable for the following components:      Result Value   RBC 3.35 (*)    Hemoglobin 10.3 (*)    HCT 30.2 (*)    RDW 16.8 (*)    Abs Immature Granulocytes 0.17 (*)    All other components within normal limits  BASIC METABOLIC PANEL - Abnormal; Notable for the following components:   Sodium 128 (*)    Glucose, Bld 105 (*)    BUN 33 (*)    Creatinine, Ser 1.80 (*)    GFR calc non Af Amer 29 (*)    GFR calc Af Amer 34 (*)    All other components within normal limits  TSH - Abnormal; Notable for the following components:   TSH 13.506 (*)    All other components within normal limits  URINALYSIS, ROUTINE W REFLEX MICROSCOPIC - Abnormal; Notable for the following components:   Hgb urine  dipstick SMALL (*)    All other components within normal limits  T4, FREE    EKG None  Radiology No results found.  Procedures Procedures (including critical care time)  Medications Ordered in ED Medications  sodium chloride 0.9 % bolus 1,000 mL (1,000 mLs  Intravenous New Bag/Given 04/01/19 2101)     Initial Impression / Assessment and Plan / ED Course  I have reviewed the triage vital signs and the nursing notes.  Pertinent labs & imaging results that were available during my care of the patient were reviewed by me and considered in my medical decision making (see chart for details).  Pt is a 63 yo female with HTN, hypothyroidism, on hormonal replacement therapy presenting to the ED with sore throat, fatigue, weight loss and decreased appetite for the past 4 weeks. She was seen by her PCP and informed that her lab work indicated adrenal crisis and was referred to the ED. TSH at PCP's office was 12.3. She also states her synthroid was increased from 137 mcg to 150 mcg about 2 months ago. She denies missing any doses.   She recently started a taper of her hormonal replacement patch in January. She has weaned down from two patches a week to currently 1/2 a patch a week. She states in the past this has worsened her thyroid disease in the past.    TSH 13.5, T4 1.07. Cr elevated at 1.80, baseline below 1.0. Patient was given 1L NS bolus. PCP was concerned for adrenal insufficieny due to new hyponatremia. Will consult hospitalist for admission.    Final Clinical Impressions(s) / ED Diagnoses   Final diagnoses:  Elevated TSH  AKI (acute kidney injury) [facility])    ED Discharge Orders    None       Ysidro Ramsay N, DO 04/01/19 2237    Lajean Saver, MD 04/02/19 1535

## 2019-04-01 NOTE — H&P (Signed)
History and Physical    Crystal Larsen DJM:[id number] DOB: [birth year] DOA: 04/01/2019  PCP: Jonathon Jordan, MD Patient coming from: Home  Chief Complaint: Fatigue  HPI: Crystal Larsen is a 63 y.o. female with medical history significant of arthritis, depression, gallstones, GERD, hypertension, hypothyroidism, gout, neuropathy, obesity, and conditions listed below presenting to the hospital for evaluation of fatigue, weight loss, and decreased appetite x 4 weeks.  She was seen by her PCP today and had blood work done.  She was later called by her PCP and asked to come into the hospital due to concern for adrenal crisis.  Patient states she also previously had ulcers in her mouth which she was told by her PCP were due to chronic antihistamine use.  States the ulcers have now resolved.  States she has also been having a sore throat for the past 1 month for which she has been evaluated by her PCP but no cause has been found.  States she has lost about 15 pounds in the past 3 to 4 weeks.  She has been feeling very tired and not eating much.  States she is on chronic hormone replacement therapy with estradiol and the dose is being weaned since January.  States she has been on Synthroid since 1981.  The dose of Synthroid was recently increased from 137 mcg to 150 mcg about 6 weeks ago.  Reports compliance with Synthroid.  Patient denies any fevers, chills, cough, shortness of breath, chest pain, nausea, vomiting, abdominal pain, diarrhea, or dysuria.  States her skin appears darker only in sun exposed areas as she likes spending time outdoors.  ED Course: On arrival, temperature 98.3 F, pulse 111, respiratory rate 15, blood pressure 135/83, SPO2 100% on room air.  No leukocytosis.  Hemoglobin 10.3, close to baseline.  Platelet count 394.  Sodium 128.  Potassium 4.9.  Glucose 105.  BUN 33.  Creatinine 1.8, baseline 0.9-1.1.  Calcium 9.6.  TSH 13.5.  Free T4 1.07.  UA not suggestive of infection. SARS-CoV-2  rapid test pending.  Received 1 L normal saline bolus.  Tachycardia resolved.  Review of Systems: As per HPI otherwise 10 point review of systems negative.  Past Medical History:  Diagnosis Date  . Arthritis    "left foot; left hand; right shoulder; back" (11/04/2014)  . Depression   . Elevated liver enzymes   . Gallstones   . GERD (gastroesophageal reflux disease)   . History of gout   . HTN (hypertension)   . Hypothyroidism   . IBS (irritable bowel syndrome)    ?  [name] Migraine    "maybe once/yr" (11/04/2014)  . Neuropathy    right leg  . Obesity   . Pancreatitis   . PONV (postoperative nausea and vomiting) 1981 X 1   only time  . Seizures ([place]) 2003    due to tramadol reaction -- takes depakote for migraine prevention  . Sleep apnea    does not use cpap  . Spondylolisthesis of lumbar region   . Unsteady gait   . Wears glasses     Past Surgical History:  Procedure Laterality Date  . ANKLE GANGLION CYST EXCISION Right 1980's  . BACK SURGERY    . BREAST SURGERY     biopsy  . BUNIONECTOMY Left 2012  . CESAREAN SECTION  1986  . COLONOSCOPY W/ BIOPSIES AND POLYPECTOMY    . EYE SURGERY Bilateral 2012   "lasered a hole in my iris cause pressure was building up"  .  LAPAROSCOPIC CHOLECYSTECTOMY  1990's  . NASAL SINUS SURGERY  1980's  . POSTERIOR LUMBAR FUSION  05/2013   L4-5; S1  . POSTERIOR LUMBAR FUSION  11/04/2014   L3-4  . SHOULDER ARTHROSCOPY W/ ROTATOR CUFF REPAIR Right 2012  . TUBAL LIGATION  1992     reports that she has never smoked. She has never used smokeless tobacco. She reports current alcohol use of about 1.0 standard drinks of alcohol per week. She reports that she does not use drugs.  Allergies  Allergen Reactions  . Ultram [Tramadol] Other (See Comments)    seizures  . Adhesive [Tape] Rash    bandaide  . Erythromycin Rash  . Latex Rash  . Neurontin [Gabapentin] Itching  . Sulfonamide Derivatives Rash    Family History  Problem Relation Age of  Onset  . Asthma Mother   . Lung cancer Mother   . Heart disease Father   . Heart disease Maternal Grandfather        PGF  . Alcohol abuse Maternal Grandfather   . Hyperlipidemia Maternal Grandfather   . Anxiety disorder Sister   . Fibromyalgia Sister   . Nephrolithiasis Daughter     Prior to Admission medications   Medication Sig Start Date End Date Taking? Authorizing Provider  acetaminophen (TYLENOL) 500 MG tablet Take 1,000 mg by mouth every 6 (six) hours as needed (for general aches/pain.).    [provider]  aspirin EC 81 MG tablet Take 81 mg by mouth at bedtime.    [provider]  desvenlafaxine (PRISTIQ) 50 MG 24 hr tablet TAKE 1 TABLET BY MOUTH  DAILY 12/13/18   Dohmeier, Asencion Partridge, MD  divalproex (DEPAKOTE ER) 250 MG 24 hr tablet TAKE 1 TABLET BY MOUTH  DAILY 03/03/19   Dohmeier, Asencion Partridge, MD  doxylamine, Sleep, (UNISOM) 25 MG tablet Take 25 mg by mouth at bedtime.    [provider]  hydrOXYzine (ATARAX/VISTARIL) 25 MG tablet Take 25 mg by mouth 2 (two) times daily.  02/09/18   [provider]  levocetirizine (XYZAL) 5 MG tablet Take 5 mg by mouth at bedtime.    [provider]  levothyroxine (SYNTHROID, LEVOTHROID) 150 MCG tablet TAKE 1 TABLET BY MOUTH ONCE DAILY IN THE MORNING ON AN EMPTY STOMACH FOR 30 DAYS (DOSAGE CHANGE) 02/17/19   [provider]  lisinopril (PRINIVIL,ZESTRIL) 40 MG tablet Take 40 mg by mouth daily.  03/23/16   [provider]  loratadine (CLARITIN) 10 MG tablet Take 10 mg by mouth daily.     [provider]  Melatonin 5 MG TABS Take 5 mg by mouth at bedtime.    [provider]  MINIVELLE 0.0375 MG/24HR Place 0.5 patches onto the skin 2 (two) times a week.  09/23/14   [provider]  niacin (NIASPAN) 500 MG CR tablet Take 500 mg by mouth at bedtime.    [provider]  Omega-3 Fatty Acids (FISH OIL) 1000 MG CAPS Take 1,000 mg by mouth at bedtime.     [provider]  pantoprazole (PROTONIX) 40 MG tablet pantoprazole 40 mg tablet,delayed release    [provider]  polyethylene glycol (MIRALAX / GLYCOLAX) packet Take 17 g by mouth daily.    [provider]  Probiotic Product (PROBIOTIC PO) Take 1 capsule by mouth daily.     [provider]  progesterone (PROMETRIUM) 100 MG capsule Take 100 mg by mouth at bedtime.     [provider]  triamterene-hydrochlorothiazide [facility]) 37.5-25  MG per tablet Take 0.5 tablets by mouth daily.  09/27/11   [provider]    Physical Exam: Vitals:   04/01/19 2230 04/01/19 2245 04/01/19 2300 04/01/19 2315  BP: (!) 154/89 (!) 150/92 (!) 147/89 (!) 164/98  Pulse: 87 93 89 92  Resp: 20 18 17 15   Temp:      TempSrc:      SpO2: 100% 100% 100% 100%  Weight:      Height:        Physical Exam  Constitutional: She is oriented to person, place, and time. She appears well-developed and well-nourished. No distress.  HENT:  Head: Normocephalic.  Moist mucous membranes Very mild oropharyngeal erythema.  No exudate noted.  Eyes: Right eye exhibits no discharge. Left eye exhibits no discharge.  Neck: Neck supple.  Cardiovascular: Normal rate, regular rhythm and intact distal pulses.  Pulmonary/Chest: Effort normal and breath sounds normal. No respiratory distress. She has no wheezes. She has no rales.  Abdominal: Soft. Bowel sounds are normal. She exhibits no distension. There is no abdominal tenderness. There is no guarding.  Musculoskeletal:        General: No edema.  Neurological: She is alert and oriented to person, place, and time.  Skin: Skin is warm and dry. She is not diaphoretic.  Skin hyperpigmentation noted in sun exposed areas such as face, neck, upper chest, upper back, and arms.     Labs on Admission: I have personally reviewed following labs and imaging studies  CBC: Recent Labs  Lab 04/01/19 1943  WBC 4.0  NEUTROABS 2.8  HGB 10.3*  HCT  30.2*  MCV 90.1  PLT 528   Basic Metabolic Panel: Recent Labs  Lab 04/01/19 1943  NA 128*  K 4.9  CL 99  CO2 22  GLUCOSE 105*  BUN 33*  CREATININE 1.80*  CALCIUM 9.6   GFR: Estimated Creatinine Clearance: 33.8 mL/min (A) (by C-G formula based on SCr of 1.8 mg/dL (H)). Liver Function Tests: No results for input(s): AST, ALT, ALKPHOS, BILITOT, PROT, ALBUMIN in the last 168 hours. No results for input(s): LIPASE, AMYLASE in the last 168 hours. No results for input(s): AMMONIA in the last 168 hours. Coagulation Profile: No results for input(s): INR, PROTIME in the last 168 hours. Cardiac Enzymes: No results for input(s): CKTOTAL, CKMB, CKMBINDEX, TROPONINI in the last 168 hours. BNP (last 3 results) No results for input(s): PROBNP in the last 8760 hours. HbA1C: No results for input(s): HGBA1C in the last 72 hours. CBG: No results for input(s): GLUCAP in the last 168 hours. Lipid Profile: No results for input(s): CHOL, HDL, LDLCALC, TRIG, CHOLHDL, LDLDIRECT in the last 72 hours. Thyroid Function Tests: Recent Labs    04/01/19 1944 04/01/19 2027  TSH 13.506*  --   FREET4  --  1.07   Anemia Panel: No results for input(s): VITAMINB12, FOLATE, FERRITIN, TIBC, IRON, RETICCTPCT in the last 72 hours. Urine analysis:    Component Value Date/Time   COLORURINE YELLOW 04/01/2019 2058   APPEARANCEUR CLEAR 04/01/2019 2058   LABSPEC 1.018 04/01/2019 2058   [place] 5.0 04/01/2019 2058   GLUCOSEU NEGATIVE 04/01/2019 2058   [place] (A) 04/01/2019 2058   BILIRUBINUR NEGATIVE 04/01/2019 2058   [place] 04/01/2019 2058   PROTEINUR NEGATIVE 04/01/2019 2058   UROBILINOGEN 1.0 06/30/2013 1423   NITRITE NEGATIVE 04/01/2019 2058   LEUKOCYTESUR NEGATIVE 04/01/2019 2058    Radiological Exams on Admission: No results found.  Assessment/Plan Principal Problem:   Adrenal insufficiency (  [place]) Active Problems:   GERD (gastroesophageal reflux disease)   Hyponatremia    Hypothyroidism   AKI (acute kidney injury) ([place])   Suspected acute to subacute adrenal insufficiency Presenting with complaints of anorexia, weight loss, and fatigue for a month.  Hemodynamically stable.  Does have laboratory findings consistent with adrenal insufficiency including hyponatremia and azotemia.  No hypotension, hypoglycemia, fever, nausea, vomiting, abdominal pain, or confusion. -Received 1 L IV fluid bolus in the ED.  Continue IV fluid hydration with normal saline infusion. -IV hydrocortisone 100 mg bolus, then 50 mg every 6 hours -Check cortisol, ACTH, aldosterone, renin -Continue to monitor serum chemistries  Hyponatremia Likely secondary to adrenal insufficiency.  Sodium 128. -Normal saline infusion at 125 cc/h, adjust rate based on repeat labs -BMP every 4 hours -Avoid rapid correction.  Goal rate of correction 4 to 6 mEq in 24 hours.  Hypothyroidism on chronic thyroid replacement therapy Patient reports compliance with Synthroid.  Per patient, dose was increased from 137 mcg to 150 mcg by her PCP 6 weeks ago.  TSH 13.5.  Free T4 1.07. -Increase dose of Lasix to 175 mcg daily -She will need repeat thyroid function studies in 6 weeks  AKI on CKD 3 BUN 33.  Creatinine 1.8, baseline 0.9-1.1.  Possibly prerenal. -IV fluid hydration -Continue to monitor BMP -Avoid nephrotoxic agents -Renal ultrasound  Hypertension Blood pressure elevated with systolic in the 735H. -Hydralazine PRN -Hold home lisinopril, triamterene-hydrochlorothiazide given AKI  GERD -Continue PPI  Depression -Effexor  DVT prophylaxis: Lovenox Code Status: Patient wishes to be full code. Family Communication: No family available. Disposition Plan: Anticipate discharge after clinical improvement. Consults called: None Admission status: Observation  This chart was dictated using voice recognition software.  Despite best efforts to proofread, errors can occur which can change the documentation  meaning.  Shela Leff MD [facility] Pager [phone]  If 7PM-7AM, please contact night-coverage [email or web address] Password [email or web address]  04/01/2019, 11:58 PM

## 2019-04-02 ENCOUNTER — Other Ambulatory Visit: Payer: Self-pay

## 2019-04-02 ENCOUNTER — Observation Stay (HOSPITAL_COMMUNITY): Payer: 59

## 2019-04-02 ENCOUNTER — Encounter (HOSPITAL_COMMUNITY): Payer: Self-pay | Admitting: *Deleted

## 2019-04-02 DIAGNOSIS — E872 Acidosis, unspecified: Secondary | ICD-10-CM | POA: Diagnosis present

## 2019-04-02 DIAGNOSIS — R7989 Other specified abnormal findings of blood chemistry: Secondary | ICD-10-CM | POA: Diagnosis not present

## 2019-04-02 DIAGNOSIS — E871 Hypo-osmolality and hyponatremia: Secondary | ICD-10-CM

## 2019-04-02 DIAGNOSIS — E2749 Other adrenocortical insufficiency: Secondary | ICD-10-CM | POA: Diagnosis not present

## 2019-04-02 DIAGNOSIS — N179 Acute kidney failure, unspecified: Secondary | ICD-10-CM

## 2019-04-02 DIAGNOSIS — I10 Essential (primary) hypertension: Secondary | ICD-10-CM | POA: Diagnosis present

## 2019-04-02 LAB — BASIC METABOLIC PANEL
Anion gap: 12 (ref 5–15)
Anion gap: 13 (ref 5–15)
Anion gap: 6 (ref 5–15)
BUN: 27 mg/dL — ABNORMAL HIGH (ref 8–23)
BUN: 27 mg/dL — ABNORMAL HIGH (ref 8–23)
BUN: 26 mg/dL — ABNORMAL HIGH (ref 8–23)
CO2: 16 mmol/L — ABNORMAL LOW (ref 22–32)
CO2: 18 mmol/L — ABNORMAL LOW (ref 22–32)
CO2: 17 mmol/L — ABNORMAL LOW (ref 22–32)
Calcium: 9.5 mg/dL (ref 8.9–10.3)
Calcium: 9.5 mg/dL (ref 8.9–10.3)
Calcium: 9.5 mg/dL (ref 8.9–10.3)
Chloride: 101 mmol/L (ref 98–111)
Chloride: 102 mmol/L (ref 98–111)
Chloride: 107 mmol/L (ref 98–111)
Creatinine, Ser: 1.46 mg/dL — ABNORMAL HIGH (ref 0.44–1.00)
Creatinine, Ser: 1.51 mg/dL — ABNORMAL HIGH (ref 0.44–1.00)
Creatinine, Ser: 1.37 mg/dL — ABNORMAL HIGH (ref 0.44–1.00)
GFR calc Af Amer: 42 mL/min — ABNORMAL LOW (ref >60)
GFR calc Af Amer: 44 mL/min — ABNORMAL LOW (ref >60)
GFR calc Af Amer: 47 mL/min — ABNORMAL LOW (ref >60)
GFR calc non Af Amer: 36 mL/min — ABNORMAL LOW (ref >60)
GFR calc non Af Amer: 38 mL/min — ABNORMAL LOW (ref >60)
GFR calc non Af Amer: 41 mL/min — ABNORMAL LOW (ref >60)
Glucose, Bld: 116 mg/dL — ABNORMAL HIGH (ref 70–99)
Glucose, Bld: 117 mg/dL — ABNORMAL HIGH (ref 70–99)
Glucose, Bld: 134 mg/dL — ABNORMAL HIGH (ref 70–99)
Potassium: 4.8 mmol/L (ref 3.5–5.1)
Potassium: 4.9 mmol/L (ref 3.5–5.1)
Potassium: 5 mmol/L (ref 3.5–5.1)
Sodium: 131 mmol/L — ABNORMAL LOW (ref 135–145)
Sodium: 131 mmol/L — ABNORMAL LOW (ref 135–145)
Sodium: 130 mmol/L — ABNORMAL LOW (ref 135–145)

## 2019-04-02 LAB — CORTISOL: Cortisol, Plasma: 62.6 ug/dL

## 2019-04-02 LAB — MRSA PCR SCREENING: MRSA by PCR: NEGATIVE

## 2019-04-02 LAB — HEPATIC FUNCTION PANEL
ALT: 24 U/L (ref 0–44)
AST: 51 U/L — ABNORMAL HIGH (ref 15–41)
Albumin: 2.7 g/dL — ABNORMAL LOW (ref 3.5–5.0)
Alkaline Phosphatase: 95 U/L (ref 38–126)
Bilirubin, Direct: 0.1 mg/dL (ref 0.0–0.2)
Indirect Bilirubin: 0.3 mg/dL (ref 0.3–0.9)
Total Bilirubin: 0.4 mg/dL (ref 0.3–1.2)
Total Protein: 7.4 g/dL (ref 6.5–8.1)

## 2019-04-02 LAB — HIV ANTIBODY (ROUTINE TESTING W REFLEX): HIV Screen 4th Generation wRfx: NONREACTIVE

## 2019-04-02 MED ORDER — HYDRALAZINE HCL 20 MG/ML IJ SOLN
5.0000 mg | INTRAMUSCULAR | Status: DC | PRN
  Administered 2019-04-02 (×2): 5 mg via INTRAVENOUS
  Filled 2019-04-02 (×2): qty 1

## 2019-04-02 MED ORDER — SODIUM CHLORIDE 0.9 % IV SOLN
INTRAVENOUS | Status: AC
  Administered 2019-04-02: 15:00:00 via INTRAVENOUS

## 2019-04-02 MED ORDER — ADULT MULTIVITAMIN W/MINERALS CH
1.0000 | ORAL_TABLET | Freq: Every day | ORAL | Status: DC
  Administered 2019-04-02 – 2019-04-03 (×2): 1 via ORAL
  Filled 2019-04-02 (×2): qty 1

## 2019-04-02 MED ORDER — HYDROCORTISONE NA SUCCINATE PF 100 MG IJ SOLR
25.0000 mg | Freq: Four times a day (QID) | INTRAMUSCULAR | Status: DC
  Administered 2019-04-02 – 2019-04-03 (×4): 25 mg via INTRAVENOUS
  Filled 2019-04-02 (×4): qty 2

## 2019-04-02 MED ORDER — ENSURE MAX PROTEIN PO LIQD
11.0000 [oz_av] | Freq: Every day | ORAL | Status: DC
  Administered 2019-04-02: 11 [oz_av] via ORAL
  Filled 2019-04-02 (×3): qty 330

## 2019-04-02 NOTE — Progress Notes (Signed)
Initial Nutrition Assessment  DOCUMENTATION CODES:   Not applicable  INTERVENTION:    Ensure MAX Protein po once daily, each supplement provides 150 kcal and 30 grams of protein  MVI daily  NUTRITION DIAGNOSIS:   Increased nutrient needs related to acute illness as evidenced by estimated needs.  GOAL:   Patient will meet greater than or equal to 90% of their needs  MONITOR:   PO intake, Supplement acceptance, Labs, Weight trends, I & O's, Skin  REASON FOR ASSESSMENT:   Malnutrition Screening Tool    ASSESSMENT:   Patient with PMH significant for arthritis, depression, gallstones, GERD, HTN, neuropathy, and gout. Presents this admission with subacute adrenal insufficiency.    RD working remotely.  Spoke with pt via phone. Endorses having a loss in appetite for 1.5 months due to feelings of early satiety upon eating. During this time she would consume 1-2 small meals daily but usually had to stop herself as she felt extremely full after a few bites. Meals consisted of scrambled eggs, grilled chicken, and strawberries. Meal completions charted this admission as 100% for her last two meals. Pt continues to complain of fullness upon eating. Discussed the importance of protein intake for preservation of lean body mass. Pt amendable to supplementation although she does not use it at home.   Pt reports she attempted to lose weight in August for her daughters wedding by lifestyle changes. She ultimately go down to 180 lb and then stopped trying. Since then she lost 15 lb unintentionally down to 165 lb (8.3% in 8 months) Per chart records, pt shows to have weighed 191 lb in 03/2018 and 166 lb this admission (13.1% wt loss in one year, insignificant for time frame).  If wt loss and poor appetite continue, pt is at risk for malnutrition.   I/O: +1,770 ml since admit UOP: 200 ml x 24 hrs   Medications: solumedrol, miralax Labs: Na 130 (L)   Diet Order:   Diet Order           Diet Heart Room service appropriate? Yes; Fluid consistency: Thin  Diet effective now              EDUCATION NEEDS:   Education needs have been addressed  Skin:  Skin Assessment: Reviewed RN Assessment  Last BM:  5/3  Height:   Ht Readings from Last 1 Encounters:  04/02/19 5\' 7"  (1.702 m)    Weight:   Wt Readings from Last 1 Encounters:  04/02/19 75.3 kg    Ideal Body Weight:  61.4 kg  BMI:  Body mass index is 26 kg/m.  Estimated Nutritional Needs:   Kcal:  1700-1900 kcal   Protein:  85-100 grams  Fluid:  >/= 1.7 L/day    Mariana Single RD, LDN Clinical Nutrition Pager # - [phone]

## 2019-04-02 NOTE — Progress Notes (Signed)
Called ER RN for report. Room Ready.

## 2019-04-02 NOTE — ED Notes (Signed)
Patient transported to Ultrasound 

## 2019-04-02 NOTE — Progress Notes (Signed)
[facility] PROGRESS NOTE  Crystal Larsen TWS:[id number] DOB: [birth year] DOA: 04/01/2019 PCP: Jonathon Jordan, MD  Assessment/Plan:  AKI on CKD 3. Likely related to dehydration in setting of decreased oral intake. Patient admits decreased oral intake with weight loss over last 2 months. She attributes to thyroid as recent TSH 13. Renal US reveals no acute process but increased asmmetry in kidney size. Home medications include lisinopril and HCTZ. BUN 33.  Creatinine 1.8, baseline 0.9-1.1. creatinine trending down this am - continue IV fluid  -hold home lisinopril and HCTZ -monitor urine output -recheck in am  Hyponatremia Likely secondary dehydration in setting of decreased oral intake and HCTZ.  Sodium 128 on admission. -This am sodium 131.  -Continue IV fluids -recheck in am  Non-anion gap metabolic acidosis. Likely related to decreased oral intake in setting of HCTZ. CO2 17.  -IV fluids as noted above -hold HCTZ -recheck in am  Suspected acute to subacute adrenal insufficiency.Presented to PCP  with complaints of anorexia, weight loss, and fatigue for a month. Labs done and patient instructed to come to ED as concern for adrenal crisis. Labs obtained and revealed hyponatremia and azotemia.  No hypotension, hypoglycemia, fever, nausea, vomiting, abdominal pain, or confusion. Received solucortef in ED Received 1 L IV fluid bolus in the ED. This am appetite improved, sodium level 131, creatinine trending down. aCTH level pending. Cortisol level 62. Aldosternone +renin pending. Hepatic function test with albumin 2.7 and AST 51 - Continue IV fluid hydration with normal saline infusion. -IV hydrocortisone  then 50 mg every 6 hours -Continue to monitor serum chemistries -will call PCP tomorrow (out of office today) to discuss  Hypothyroidism on chronic thyroid replacement therapy Patient reports compliance with Synthroid.  Per patient, dose was increased from 137 mcg to 150 mcg by  her PCP 6 weeks ago.  TSH 13.5.  Free T4 1.07. -Increased dose of Lasix to 175 mcg daily - repeat thyroid function studies in 6 weeks  Hypertension remains high end of normal. Has not been getting prn hydralazine ordered.  Home meds include lisinopril, triamterene-hydrochlorothiazide -hold lisinopril and triamterene ACTZ due to aki -prn hydralazine -discussed with nursing staff need to monitor BP and provide prn med when parameters met -VS every 4 hours x3  GERD -Continue PPI  Depression -Effexor  Code Status: full Family Communication: plan discussed with patient Disposition Plan: home when ready    Consultants:    Procedures:    Antibiotics:    HPI/Subjective: 62 admitted with aki, hyponatremia presumably related to dehydration but some concern for adrenal crisis. Sent to ED from home under PCP instructions due to recent lab work.   Objective: Vitals:   04/02/19 0412 04/02/19 0700  BP: (!) 153/71 (!) 162/98  Pulse: 100   Resp: 19 18  Temp: 98.7 F (37.1 C) 98.6 F (37 C)  SpO2: 98% 100%    Intake/Output Summary (Last 24 hours) at 04/02/2019 1437 Last data filed at 04/02/2019 1242 Gross per 24 hour  Intake 1970 ml  Output 200 ml  Net 1770 ml   Filed Weights   04/01/19 1740 04/02/19 0128  Weight: 74.8 kg 75.3 kg    Exam:   General:  Awake alert oriented no acute distress  Cardiovascular: rrr no mgr LE edema  Respiratory: normal effort BS clear bilaterally no wheeze  Abdomen: non-distended +BS soft no guarding or rebounding  Musculoskeletal: joints without swelling/erythema MAE full rom   Data Reviewed: Basic Metabolic Panel: Recent Labs  Lab 04/01/19  1943 04/02/19 0357 04/02/19 0717  NA 128* 131*  131* 130*  K 4.9 4.9  4.8 5.0  CL 99 102  101 107  CO2 22 16*  18* 17*  GLUCOSE 105* 116*  117* 134*  BUN 33* 27*  27* 26*  CREATININE 1.80* 1.46*  1.51* 1.37*  CALCIUM 9.6 9.5  9.5 9.5   Liver Function Tests: Recent Labs  Lab  04/02/19 0717  AST 51*  ALT 24  ALKPHOS 95  BILITOT 0.4  PROT 7.4  ALBUMIN 2.7*   No results for input(s): LIPASE, AMYLASE in the last 168 hours. No results for input(s): AMMONIA in the last 168 hours. CBC: Recent Labs  Lab 04/01/19 1943  WBC 4.0  NEUTROABS 2.8  HGB 10.3*  HCT 30.2*  MCV 90.1  PLT 394   Cardiac Enzymes: No results for input(s): CKTOTAL, CKMB, CKMBINDEX, TROPONINI in the last 168 hours. BNP (last 3 results) No results for input(s): BNP in the last 8760 hours.  ProBNP (last 3 results) No results for input(s): PROBNP in the last 8760 hours.  CBG: No results for input(s): GLUCAP in the last 168 hours.  Recent Results (from the past 240 hour(s))  SARS Coronavirus 2 (CEPHEID - Performed in [place] hospital lab), Hosp Order     Status: None   Collection Time: 04/01/19 10:32 PM  Result Value Ref Range Status   SARS Coronavirus 2 NEGATIVE NEGATIVE Final    Comment: (NOTE) If result is NEGATIVE SARS-CoV-2 target nucleic acids are NOT DETECTED. The SARS-CoV-2 RNA is generally detectable in upper and lower  respiratory specimens during the acute phase of infection. The lowest  concentration of SARS-CoV-2 viral copies this assay can detect is 250  copies / mL. A negative result does not preclude SARS-CoV-2 infection  and should not be used as the sole basis for treatment or other  patient management decisions.  A negative result may occur with  improper specimen collection / handling, submission of specimen other  than nasopharyngeal swab, presence of viral mutation(s) within the  areas targeted by this assay, and inadequate number of viral copies  (<250 copies / mL). A negative result must be combined with clinical  observations, patient history, and epidemiological information. If result is POSITIVE SARS-CoV-2 target nucleic acids are DETECTED. The SARS-CoV-2 RNA is generally detectable in upper and lower  respiratory specimens dur ing the acute phase  of infection.  Positive  results are indicative of active infection with SARS-CoV-2.  Clinical  correlation with patient history and other diagnostic information is  necessary to determine patient infection status.  Positive results do  not rule out bacterial infection or co-infection with other viruses. If result is PRESUMPTIVE POSTIVE SARS-CoV-2 nucleic acids MAY BE PRESENT.   A presumptive positive result was obtained on the submitted specimen  and confirmed on repeat testing.  While 2019 novel coronavirus  (SARS-CoV-2) nucleic acids may be present in the submitted sample  additional confirmatory testing may be necessary for epidemiological  and / or clinical management purposes  to differentiate between  SARS-CoV-2 and other Sarbecovirus currently known to infect humans.  If clinically indicated additional testing with an alternate test  methodology [phone]) is advised. The SARS-CoV-2 RNA is generally  detectable in upper and lower respiratory sp ecimens during the acute  phase of infection. The expected result is Negative. Fact Sheet for Patients:  StrictlyIdeas.no Fact Sheet for Healthcare Providers: BankingDealers.co.za This test is not yet approved or cleared by the [place]  FDA and has been authorized for detection and/or diagnosis of SARS-CoV-2 by FDA under an Emergency Use Authorization (EUA).  This EUA will remain in effect (meaning this test can be used) for the duration of the COVID-19 declaration under Section 564(b)(1) of the Act, 21 U.S.C. section 360bbb-3(b)(1), unless the authorization is terminated or revoked sooner. Performed at [facility], [place] [address], [place], [place] [address]   MRSA PCR Screening     Status: None   Collection Time: 04/02/19  5:33 AM  Result Value Ref Range Status   MRSA by PCR NEGATIVE NEGATIVE Final    Comment:        The GeneXpert MRSA Assay (FDA approved for NASAL  specimens only), is one component of a comprehensive MRSA colonization surveillance program. It is not intended to diagnose MRSA infection nor to guide or monitor treatment for MRSA infections. Performed at [facility], [place] [address] [place], [place] [address]      Studies: US Renal  Result Date: 04/02/2019 CLINICAL DATA:  63 y/o  F; acute kidney injury. EXAM: RENAL / URINARY TRACT ULTRASOUND COMPLETE COMPARISON:  02/23/2016 MRI of the abdomen. 09/03/2015 renal ultrasound. FINDINGS: Right Kidney: Renal measurements: 9.9 x 4.1 x 3.9 cm = volume: 86 mL . Echogenicity within normal limits. No mass or hydronephrosis visualized. Left Kidney: Renal measurements: 11.6 x 5.2 x 4.7 cm = volume: 149 mL. Echogenicity within normal limits. No mass or hydronephrosis visualized. Bladder: Appears normal for degree of bladder distention. IMPRESSION: No acute process identified. Increased asymmetry in kidney size in comparison with prior studies, possibly sampling error versus interval right kidney atrophy. Electronically Signed   By: Kristine Garbe M.D.   On: 04/02/2019 00:36    Scheduled Meds: . aspirin EC  81 mg Oral QHS  . divalproex  250 mg Oral Daily  . doxylamine (Sleep)  25 mg Oral QHS  . enoxaparin (LOVENOX) injection  40 mg Subcutaneous Q24H  . hydrocortisone sod succinate (SOLU-CORTEF) inj  50 mg Intravenous Q6H  . hydrOXYzine  25 mg Oral BID  . levothyroxine  175 mcg Oral QAC breakfast  . loratadine  10 mg Oral Daily  . Melatonin  3 mg Oral QHS  . niacin  500 mg Oral QHS  . pantoprazole  40 mg Oral Daily  . polyethylene glycol  17 g Oral Daily  . progesterone  100 mg Oral QHS  . venlafaxine XR  75 mg Oral Q breakfast   Continuous Infusions: . sodium chloride      Principal Problem:   AKI (acute kidney injury) ([place]) Active Problems:   Hyponatremia   Hypothyroidism   GERD (gastroesophageal reflux disease)   Metabolic acidosis, normal anion gap (NAG)   HTN  (hypertension)    Time spent: 58 minutes    [place] NP  [facility]  If 7PM-7AM, please contact night-coverage at [email or web address], password [facility] 04/02/2019, 2:37 PM  LOS: 0 days

## 2019-04-03 DIAGNOSIS — N179 Acute kidney failure, unspecified: Secondary | ICD-10-CM | POA: Diagnosis not present

## 2019-04-03 DIAGNOSIS — E2749 Other adrenocortical insufficiency: Secondary | ICD-10-CM

## 2019-04-03 DIAGNOSIS — E871 Hypo-osmolality and hyponatremia: Secondary | ICD-10-CM | POA: Diagnosis not present

## 2019-04-03 DIAGNOSIS — R7989 Other specified abnormal findings of blood chemistry: Secondary | ICD-10-CM | POA: Diagnosis not present

## 2019-04-03 LAB — COMPREHENSIVE METABOLIC PANEL
ALT: 22 U/L (ref 0–44)
AST: 42 U/L — ABNORMAL HIGH (ref 15–41)
Albumin: 2.5 g/dL — ABNORMAL LOW (ref 3.5–5.0)
Alkaline Phosphatase: 85 U/L (ref 38–126)
Anion gap: 9 (ref 5–15)
BUN: 30 mg/dL — ABNORMAL HIGH (ref 8–23)
CO2: 17 mmol/L — ABNORMAL LOW (ref 22–32)
Calcium: 9.2 mg/dL (ref 8.9–10.3)
Chloride: 106 mmol/L (ref 98–111)
Creatinine, Ser: 1.12 mg/dL — ABNORMAL HIGH (ref 0.44–1.00)
GFR calc Af Amer: >60 mL/min (ref >60)
GFR calc non Af Amer: 52 mL/min — ABNORMAL LOW (ref >60)
Glucose, Bld: 113 mg/dL — ABNORMAL HIGH (ref 70–99)
Potassium: 4.1 mmol/L (ref 3.5–5.1)
Sodium: 132 mmol/L — ABNORMAL LOW (ref 135–145)
Total Bilirubin: 0.7 mg/dL (ref 0.3–1.2)
Total Protein: 6.7 g/dL (ref 6.5–8.1)

## 2019-04-03 LAB — ACTH: C206 ACTH: 1.6 pg/mL — ABNORMAL LOW (ref 7.2–63.3)

## 2019-04-03 MED ORDER — LEVOTHYROXINE SODIUM 175 MCG PO TABS: 175.0000 ug | ORAL_TABLET | Freq: Every day | ORAL | 1 refills | Status: DC

## 2019-04-03 MED ORDER — HYDROCORTISONE 10 MG PO TABS: ORAL_TABLET | ORAL | 1 refills | Status: DC

## 2019-04-03 NOTE — Discharge Summary (Addendum)
Physician Discharge Summary  TISH BEGIN KGY:[id number] DOB: 03-24-1956 DOA: 04/01/2019  PCP: Jonathon Jordan, MD  Admit date: 04/01/2019 Discharge date: 04/03/2019  Time spent: 45 minutes  Recommendations for Outpatient Follow-up:  1. Follow up with PCP 1 week for evaluation of symptoms. Recommend bmet to track sodium level and kidney function-- labs pending at time of d/c: renin/aldosterone ratio 2. referral to endocrinology. May require MRI brain to further work up secondary adrenal insufficiency. TSH in 6 weeks 3. ? Sarcoidosis-- vague skin symptoms, mouth lesions and now what appears to be secondary adrenal insuff   Discharge Diagnoses:  Principal Problem:   Secondary adrenal insufficiency (HCC) Active Problems:   GERD (gastroesophageal reflux disease)   Hyponatremia   Hypothyroidism   AKI (acute kidney injury) ([place])   Metabolic acidosis, normal anion gap (NAG)   HTN (hypertension)   Discharge Condition: stable  Diet recommendation: heart healthy  Filed Weights   04/01/19 1740 04/02/19 0128 04/02/19 2030  Weight: 74.8 kg 75.3 kg 75.3 kg    History of present illness:  63 yo admitted with aki, hyponatremia presumably related to dehydration but some concern for adrenal crisis. Evaluated by PCP who obtained labs and referred patient to ED for sodium of 127 and potassium of 5 and aki.  Hospital Course:   Appears to be Secondary adrenal insufficiency. - Presented to PCP  with complaints of anorexia, weight loss, and fatigue for a month. Labs done and patient instructed to come to ED as concern for adrenal crisis. Labs obtained and revealed hyponatremia and azotemia. No hypotension, hypoglycemia, fever, nausea, vomiting, abdominal pain, or confusion. - Received solucortef in ED PRIOR TO CORTISOL LEVEL - Appetite improved, energy level increased. -ACTH level 1.6 (low) concerning for secondary adrenal insuffeciency.  - Aldosternone +renin pending. - discharge with slow  hydrocortisone taper to dose of 20 mg PM and 10 mg AM Recommend close OP follow up with PCP. Will likely need endocrinology referral. Consider MRI brain.   AKI on CKD 3. Likely related to dehydration in setting of decreased oral intake. Patient admits decreased oral intake with weight loss over last 2 months. She attributes to thyroid as recent TSH 13. Renal US reveals no acute process but increased asmmetry in kidney size. Home medications include lisinopril and HCTZ. BUN 33. Creatinine 1.8 on admission and 1.12 at discharge. Recommend bmet in 1-2 weeks to trend sodium/potassium and kidney function -holding HCTZ  Hyponatremia Likely secondary dehydration in setting of decreased oral intake and HCTZ.  -OP follow up as noted above  Hypothyroidism on chronic thyroid replacement therapy Patient reports compliance with Synthroid. Per patient, dose was increased from 137 mcg to 150 mcg by her PCP 6 weeks ago. TSH 13.5. Free T4 1.07. Increased dose of Lasix to 175 mcg daily repeat thyroid function studies in 6 weeks  Hypertension. Fair control. Resume home med. Close OP follow up for optimal control  GERD -Continue PPI  Depression -Effexor    Discharge Exam: Vitals:   04/03/19 0428 04/03/19 0855  BP: (!) 148/88 (!) 144/90  Pulse: (!) 110 (!) 101  Resp: 18 18  Temp: 98 F (36.7 C) (!) 97.5 F (36.4 C)  SpO2: 95% 99%    General: awake alert oriented x3 no acute distress- says she feels much better Cardiovascular: tachycardia but regular no mgr no LE edema Respiratory: normal effort BS clear bilaterally no wheeze  Discharge Instructions   Discharge Instructions    Call MD for:  difficulty breathing, headache or visual  disturbances   Complete by:  As directed    Call MD for:  extreme fatigue   Complete by:  As directed    Call MD for:  persistant dizziness or light-headedness   Complete by:  As directed    Call MD for:  persistant nausea and vomiting   Complete by:   As directed    Diet - low sodium heart healthy   Complete by:  As directed    Discharge instructions   Complete by:  As directed    Take medication as prescribed. Follow up with PCP 1-2 weeks for evaluation of symptoms. Recommend referral to endocrinologist   Increase activity slowly   Complete by:  As directed      Allergies as of 04/03/2019      Reactions   Ultram [tramadol] Other (See Comments)   seizures   Adhesive [tape] Rash   bandaide   Erythromycin Rash   Latex Rash   Neurontin [gabapentin] Itching   Sulfonamide Derivatives Rash      Medication List    STOP taking these medications   triamterene-hydrochlorothiazide 37.5-25 MG tablet Commonly known as:  MAXZIDE-25     TAKE these medications   acetaminophen 500 MG tablet Commonly known as:  TYLENOL Take 1,000 mg by mouth every 6 (six) hours as needed (for general aches/pain.).   aspirin EC 81 MG tablet Take 81 mg by mouth at bedtime.   desvenlafaxine 50 MG 24 hr tablet Commonly known as:  PRISTIQ TAKE 1 TABLET BY MOUTH  DAILY   divalproex 250 MG 24 hr tablet Commonly known as:  DEPAKOTE ER TAKE 1 TABLET BY MOUTH  DAILY   doxylamine (Sleep) 25 MG tablet Commonly known as:  UNISOM Take 25 mg by mouth at bedtime.   estradiol 0.05 mg/24hr patch Commonly known as:  CLIMARA - Dosed in mg/24 hr Place 0.025 mg onto the skin every Sunday.   Fish Oil 1000 MG Caps Take 1,000 mg by mouth at bedtime.   hydrocortisone 10 MG tablet Commonly known as:  CORTEF Take 25mg  (2 1/2 tab) twice daily for 2 days then take 20mg (2 tabs)twice a day for 3 days then take 10mg (1 tab)in the morning and 20mg (2 tabs at night for 30 days. Start taking on:  Apr 04, 2019   hydrOXYzine 25 MG tablet Commonly known as:  ATARAX/VISTARIL Take 25 mg by mouth 2 (two) times daily.   levocetirizine 5 MG tablet Commonly known as:  XYZAL Take 5 mg by mouth at bedtime.   levothyroxine 175 MCG tablet Commonly known as:  SYNTHROID Take 1  tablet (175 mcg total) by mouth daily before breakfast. Start taking on:  Apr 04, 2019 What changed:    medication strength  how much to take   lisinopril 40 MG tablet Commonly known as:  ZESTRIL Take 40 mg by mouth daily.   loratadine 10 MG tablet Commonly known as:  CLARITIN Take 10 mg by mouth daily.   Melatonin 5 MG Tabs Take 5 mg by mouth at bedtime.   niacin 500 MG CR tablet Commonly known as:  NIASPAN Take 500 mg by mouth at bedtime.   pantoprazole 40 MG tablet Commonly known as:  PROTONIX Take 40 mg by mouth daily.   polyethylene glycol 17 g packet Commonly known as:  MIRALAX / GLYCOLAX Take 17 g by mouth daily.   PROBIOTIC PO Take 1 capsule by mouth daily.   progesterone 100 MG capsule Commonly known as:  PROMETRIUM Take 100 mg  by mouth at bedtime.      Allergies  Allergen Reactions  . Ultram [Tramadol] Other (See Comments)    seizures  . Adhesive [Tape] Rash    bandaide  . Erythromycin Rash  . Latex Rash  . Neurontin [Gabapentin] Itching  . Sulfonamide Derivatives Rash   Follow-up Information    Jonathon Jordan, MD Follow up in 1 week(s).   Specialty:  Family Medicine Why:  referral to endocrine -labs still pending at discharge Contact information: [place] [address] [phone]            The results of significant diagnostics from this hospitalization (including imaging, microbiology, ancillary and laboratory) are listed below for reference.    Significant Diagnostic Studies: US Renal  Result Date: 04/02/2019 CLINICAL DATA:  63 y/o  F; acute kidney injury. EXAM: RENAL / URINARY TRACT ULTRASOUND COMPLETE COMPARISON:  02/23/2016 MRI of the abdomen. 09/03/2015 renal ultrasound. FINDINGS: Right Kidney: Renal measurements: 9.9 x 4.1 x 3.9 cm = volume: 86 mL . Echogenicity within normal limits. No mass or hydronephrosis visualized. Left Kidney: Renal measurements: 11.6 x 5.2 x 4.7 cm = volume: 149 mL.  Echogenicity within normal limits. No mass or hydronephrosis visualized. Bladder: Appears normal for degree of bladder distention. IMPRESSION: No acute process identified. Increased asymmetry in kidney size in comparison with prior studies, possibly sampling error versus interval right kidney atrophy. Electronically Signed   By: Kristine Garbe M.D.   On: 04/02/2019 00:36    Microbiology: Recent Results (from the past 240 hour(s))  SARS Coronavirus 2 (CEPHEID - Performed in [facility] hospital lab), Hosp Order     Status: None   Collection Time: 04/01/19 10:32 PM  Result Value Ref Range Status   SARS Coronavirus 2 NEGATIVE NEGATIVE Final    Comment: (NOTE) If result is NEGATIVE SARS-CoV-2 target nucleic acids are NOT DETECTED. The SARS-CoV-2 RNA is generally detectable in upper and lower  respiratory specimens during the acute phase of infection. The lowest  concentration of SARS-CoV-2 viral copies this assay can detect is 250  copies / mL. A negative result does not preclude SARS-CoV-2 infection  and should not be used as the sole basis for treatment or other  patient management decisions.  A negative result may occur with  improper specimen collection / handling, submission of specimen other  than nasopharyngeal swab, presence of viral mutation(s) within the  areas targeted by this assay, and inadequate number of viral copies  (<250 copies / mL). A negative result must be combined with clinical  observations, patient history, and epidemiological information. If result is POSITIVE SARS-CoV-2 target nucleic acids are DETECTED. The SARS-CoV-2 RNA is generally detectable in upper and lower  respiratory specimens dur ing the acute phase of infection.  Positive  results are indicative of active infection with SARS-CoV-2.  Clinical  correlation with patient history and other diagnostic information is  necessary to determine patient infection status.  Positive results do  not rule  out bacterial infection or co-infection with other viruses. If result is PRESUMPTIVE POSTIVE SARS-CoV-2 nucleic acids MAY BE PRESENT.   A presumptive positive result was obtained on the submitted specimen  and confirmed on repeat testing.  While 2019 novel coronavirus  (SARS-CoV-2) nucleic acids may be present in the submitted sample  additional confirmatory testing may be necessary for epidemiological  and / or clinical management purposes  to differentiate between  SARS-CoV-2 and other Sarbecovirus currently known to infect humans.  If  clinically indicated additional testing with an alternate test  methodology [id number]) is advised. The SARS-CoV-2 RNA is generally  detectable in upper and lower respiratory sp ecimens during the acute  phase of infection. The expected result is Negative. Fact Sheet for Patients:  StrictlyIdeas.no Fact Sheet for Healthcare Providers: BankingDealers.co.za This test is not yet approved or cleared by the [place] FDA and has been authorized for detection and/or diagnosis of SARS-CoV-2 by FDA under an Emergency Use Authorization (EUA).  This EUA will remain in effect (meaning this test can be used) for the duration of the COVID-19 declaration under Section 564(b)(1) of the Act, 21 U.S.C. section 360bbb-3(b)(1), unless the authorization is terminated or revoked sooner. Performed at [facility], [place] [address], [place], [place] [address]   MRSA PCR Screening     Status: None   Collection Time: 04/02/19  5:33 AM  Result Value Ref Range Status   MRSA by PCR NEGATIVE NEGATIVE Final    Comment:        The GeneXpert MRSA Assay (FDA approved for NASAL specimens only), is one component of a comprehensive MRSA colonization surveillance program. It is not intended to diagnose MRSA infection nor to guide or monitor treatment for MRSA infections. Performed at [facility], [place] [address]  [place], [place] [address]      Labs: Basic Metabolic Panel: Recent Labs  Lab 04/01/19 1943 04/02/19 0357 04/02/19 0717 04/03/19 0403  NA 128* 131*  131* 130* 132*  K 4.9 4.9  4.8 5.0 4.1  CL 99 102  101 107 106  CO2 22 16*  18* 17* 17*  GLUCOSE 105* 116*  117* 134* 113*  BUN 33* 27*  27* 26* 30*  CREATININE 1.80* 1.46*  1.51* 1.37* 1.12*  CALCIUM 9.6 9.5  9.5 9.5 9.2   Liver Function Tests: Recent Labs  Lab 04/02/19 0717 04/03/19 0403  AST 51* 42*  ALT 24 22  ALKPHOS 95 85  BILITOT 0.4 0.7  PROT 7.4 6.7  ALBUMIN 2.7* 2.5*   No results for input(s): LIPASE, AMYLASE in the last 168 hours. No results for input(s): AMMONIA in the last 168 hours. CBC: Recent Labs  Lab 04/01/19 1943  WBC 4.0  NEUTROABS 2.8  HGB 10.3*  HCT 30.2*  MCV 90.1  PLT 394   Cardiac Enzymes: No results for input(s): CKTOTAL, CKMB, CKMBINDEX, TROPONINI in the last 168 hours. BNP: BNP (last 3 results) No results for input(s): BNP in the last 8760 hours.  ProBNP (last 3 results) No results for input(s): PROBNP in the last 8760 hours.  CBG: No results for input(s): GLUCAP in the last 168 hours.     Signed:  Geradine Girt DO  [facility] 04/03/2019, 2:39 PM

## 2019-04-03 NOTE — Discharge Instructions (Signed)
SECONDARY AND TERTIARY ADRENAL INSUFFICIENCY--In secondary adrenal insufficiency, an insufficient amount of corticotropin (ACTH) is produced by the pituitary gland.  Symptoms--The symptoms of secondary and tertiary adrenal insufficiency are similar to those of primary insufficiency, with a few exceptions:  ?Darkening of the skin and dehydration do not occur ?Gastrointestinal symptoms are less common ?Symptoms of hypoglycemia (low blood sugar) are more common, including sweating, anxiety, shaking, nausea, or heart palpitations A tumor or other growth in the pituitary or hypothalamus can cause other symptoms, in addition to adrenal insufficiency, including headaches and difficulty seeing objects in the periphery of vision (to the far left and right). In this setting, low levels of pituitary hormones can develop and may cause infertility, erectile dysfunction (impotence), fatigue, hoarseness, constipation, a delay in beginning puberty, or short stature in children.

## 2019-04-05 LAB — ALDOSTERONE + RENIN ACTIVITY W/ RATIO
ALDO / PRA Ratio: 3.9 (ref 0.0–30.0)
Aldosterone: 2.7 ng/dL (ref 0.0–30.0)
PRA LC/MS/MS: 0.696 ng/mL/hr (ref 0.167–5.380)

## 2019-04-10 DIAGNOSIS — E272 Addisonian crisis: Secondary | ICD-10-CM | POA: Diagnosis not present

## 2019-04-10 DIAGNOSIS — D649 Anemia, unspecified: Secondary | ICD-10-CM | POA: Diagnosis not present

## 2019-04-10 DIAGNOSIS — E46 Unspecified protein-calorie malnutrition: Secondary | ICD-10-CM | POA: Diagnosis not present

## 2019-04-11 DIAGNOSIS — D649 Anemia, unspecified: Secondary | ICD-10-CM | POA: Diagnosis not present

## 2019-04-11 DIAGNOSIS — E871 Hypo-osmolality and hyponatremia: Secondary | ICD-10-CM | POA: Diagnosis not present

## 2019-04-11 DIAGNOSIS — E039 Hypothyroidism, unspecified: Secondary | ICD-10-CM | POA: Diagnosis not present

## 2019-04-11 DIAGNOSIS — R5383 Other fatigue: Secondary | ICD-10-CM | POA: Diagnosis not present

## 2019-04-11 DIAGNOSIS — R634 Abnormal weight loss: Secondary | ICD-10-CM | POA: Diagnosis not present

## 2019-04-16 DIAGNOSIS — E871 Hypo-osmolality and hyponatremia: Secondary | ICD-10-CM | POA: Diagnosis not present

## 2019-04-16 DIAGNOSIS — E039 Hypothyroidism, unspecified: Secondary | ICD-10-CM | POA: Diagnosis not present

## 2019-05-02 ENCOUNTER — Other Ambulatory Visit: Payer: Self-pay

## 2019-05-02 ENCOUNTER — Emergency Department (HOSPITAL_COMMUNITY): Payer: 59

## 2019-05-02 ENCOUNTER — Emergency Department (HOSPITAL_COMMUNITY)
Admission: EM | Admit: 2019-05-02 | Discharge: 2019-05-02 | Disposition: A | Discharge status: Home / Self Care | Payer: 59 | Attending: Emergency Medicine | Admitting: Emergency Medicine

## 2019-05-02 DIAGNOSIS — R52 Pain, unspecified: Secondary | ICD-10-CM

## 2019-05-02 DIAGNOSIS — Z7952 Long term (current) use of systemic steroids: Secondary | ICD-10-CM | POA: Diagnosis not present

## 2019-05-02 DIAGNOSIS — E039 Hypothyroidism, unspecified: Secondary | ICD-10-CM | POA: Insufficient documentation

## 2019-05-02 DIAGNOSIS — I10 Essential (primary) hypertension: Secondary | ICD-10-CM | POA: Diagnosis not present

## 2019-05-02 DIAGNOSIS — E871 Hypo-osmolality and hyponatremia: Secondary | ICD-10-CM | POA: Insufficient documentation

## 2019-05-02 DIAGNOSIS — M7918 Myalgia, other site: Secondary | ICD-10-CM | POA: Insufficient documentation

## 2019-05-02 LAB — COMPREHENSIVE METABOLIC PANEL
ALT: 23 U/L (ref 0–44)
AST: 52 U/L — ABNORMAL HIGH (ref 15–41)
Albumin: 2.6 g/dL — ABNORMAL LOW (ref 3.5–5.0)
Alkaline Phosphatase: 54 U/L (ref 38–126)
Anion gap: 12 (ref 5–15)
BUN: 24 mg/dL — ABNORMAL HIGH (ref 8–23)
CO2: 20 mmol/L — ABNORMAL LOW (ref 22–32)
Calcium: 9.2 mg/dL (ref 8.9–10.3)
Chloride: 97 mmol/L — ABNORMAL LOW (ref 98–111)
Creatinine, Ser: 1.47 mg/dL — ABNORMAL HIGH (ref 0.44–1.00)
GFR calc Af Amer: 44 mL/min — ABNORMAL LOW (ref >60)
GFR calc non Af Amer: 38 mL/min — ABNORMAL LOW (ref >60)
Glucose, Bld: 127 mg/dL — ABNORMAL HIGH (ref 70–99)
Potassium: 3.6 mmol/L (ref 3.5–5.1)
Sodium: 129 mmol/L — ABNORMAL LOW (ref 135–145)
Total Bilirubin: 0.9 mg/dL (ref 0.3–1.2)
Total Protein: 6.9 g/dL (ref 6.5–8.1)

## 2019-05-02 LAB — CBC WITH DIFFERENTIAL/PLATELET
Abs Immature Granulocytes: 0.1 10*3/uL — ABNORMAL HIGH (ref 0.00–0.07)
Basophils Absolute: 0 10*3/uL (ref 0.0–0.1)
Basophils Relative: 0 %
Eosinophils Absolute: 0 10*3/uL (ref 0.0–0.5)
Eosinophils Relative: 0 %
HCT: 27.9 % — ABNORMAL LOW (ref 36.0–46.0)
Hemoglobin: 9.3 g/dL — ABNORMAL LOW (ref 12.0–15.0)
Immature Granulocytes: 2 %
Lymphocytes Relative: 16 %
Lymphs Abs: 1 10*3/uL (ref 0.7–4.0)
MCH: 29.8 pg (ref 26.0–34.0)
MCHC: 33.3 g/dL (ref 30.0–36.0)
MCV: 89.4 fL (ref 80.0–100.0)
Monocytes Absolute: 0.1 10*3/uL (ref 0.1–1.0)
Monocytes Relative: 2 %
Neutro Abs: 4.9 10*3/uL (ref 1.7–7.7)
Neutrophils Relative %: 80 %
Platelets: 401 10*3/uL — ABNORMAL HIGH (ref 150–400)
RBC: 3.12 MIL/uL — ABNORMAL LOW (ref 3.87–5.11)
RDW: 16.9 % — ABNORMAL HIGH (ref 11.5–15.5)
WBC: 6.1 10*3/uL (ref 4.0–10.5)
nRBC: 0 % (ref 0.0–0.2)

## 2019-05-02 LAB — URINALYSIS, ROUTINE W REFLEX MICROSCOPIC
Bilirubin Urine: NEGATIVE
Glucose, UA: NEGATIVE mg/dL
Hgb urine dipstick: NEGATIVE
Ketones, ur: NEGATIVE mg/dL
Leukocytes,Ua: NEGATIVE
Nitrite: NEGATIVE
Protein, ur: NEGATIVE mg/dL
Specific Gravity, Urine: 1.01 (ref 1.005–1.030)
pH: 6 (ref 5.0–8.0)

## 2019-05-02 LAB — TROPONIN I: Troponin I: <0.03 ng/mL (ref <0.03)

## 2019-05-02 MED ORDER — SODIUM CHLORIDE 0.9 % IV BOLUS
1000.0000 mL | Freq: Once | INTRAVENOUS | Status: AC
  Administered 2019-05-02: 1000 mL via INTRAVENOUS

## 2019-05-02 MED ORDER — HYDROCORTISONE 5 MG PO TABS
5.0000 mg | ORAL_TABLET | Freq: Once | ORAL | Status: AC
  Administered 2019-05-02: 5 mg via ORAL
  Filled 2019-05-02: qty 1

## 2019-05-02 MED ORDER — LISINOPRIL 20 MG PO TABS
40.0000 mg | ORAL_TABLET | Freq: Once | ORAL | Status: AC
  Administered 2019-05-02: 40 mg via ORAL
  Filled 2019-05-02: qty 2

## 2019-05-02 NOTE — ED Provider Notes (Addendum)
[facility] Emergency Department Provider Note MRN:  [id number]  Arrival date & time: 05/02/19     Chief Complaint   Body aches History of Present Illness   Crystal Larsen is a 63 y.o. year-old female with a history of adrenal insufficiency presenting to the ED with chief complaint of body aches.  Patient explains that she has been having increased aches to her entire body for the past 24 hours.  2 to 3 days ago, she was told by her surgeon to start decreasing her daily steroids in anticipation for carpal tunnel surgery.  She explains that when she is trying to cut back on her steroids in the past, she has experienced body aches similar to this.  She denies fever, no nausea or vomiting, no cough, no chest pain, endorsing several weeks of dyspnea on exertion and increased fatigue.  Denies abdominal pain.  Symptoms constant, no other exacerbating or alleviating factors.  Review of Systems  A complete 10 system review of systems was obtained and all systems are negative except as noted in the HPI and PMH.   Patient's Health History    Past Medical History:  Diagnosis Date  . Acute kidney injury ([place])   . Arthritis    "left foot; left hand; right shoulder; back" (11/04/2014)  . Depression   . Elevated liver enzymes   . Gallstones   . GERD (gastroesophageal reflux disease)   . History of gout   . HTN (hypertension)   . Hypothyroidism   . IBS (irritable bowel syndrome)    ?  [name] Metabolic acidosis   . Migraine    "maybe once/yr" (11/04/2014)  . Neuropathy    right leg  . Obesity   . Pancreatitis   . PONV (postoperative nausea and vomiting) 1981 X 1   only time  . Seizures ([place]) 2003    due to tramadol reaction -- takes depakote for migraine prevention  . Sleep apnea    does not use cpap  . Spondylolisthesis of lumbar region   . Unsteady gait   . Wears glasses     Past Surgical History:  Procedure Laterality Date  . ANKLE GANGLION CYST EXCISION Right  1980's  . BACK SURGERY    . BREAST SURGERY     biopsy  . BUNIONECTOMY Left 2012  . CESAREAN SECTION  1986  . COLONOSCOPY W/ BIOPSIES AND POLYPECTOMY    . EYE SURGERY Bilateral 2012   "lasered a hole in my iris cause pressure was building up"  . LAPAROSCOPIC CHOLECYSTECTOMY  1990's  . NASAL SINUS SURGERY  1980's  . POSTERIOR LUMBAR FUSION  05/2013   L4-5; S1  . POSTERIOR LUMBAR FUSION  11/04/2014   L3-4  . SHOULDER ARTHROSCOPY W/ ROTATOR CUFF REPAIR Right 2012  . TUBAL LIGATION  1992    Family History  Problem Relation Age of Onset  . Asthma Mother   . Lung cancer Mother   . Heart disease Father   . Heart disease Maternal Grandfather        PGF  . Alcohol abuse Maternal Grandfather   . Hyperlipidemia Maternal Grandfather   . Anxiety disorder Sister   . Fibromyalgia Sister   . Nephrolithiasis Daughter     Social History   Socioeconomic History  . Marital status: Married    Spouse name: Not on file  . Number of children: 2  . Years of education: Not on file  . Highest education level: Not on file  Occupational  History  . Occupation: retired  Scientific laboratory technician  . Financial resource strain: Not on file  . Food insecurity:    Worry: Not on file    Inability: Not on file  . Transportation needs:    Medical: Not on file    Non-medical: Not on file  Tobacco Use  . Smoking status: Never Smoker  . Smokeless tobacco: Never Used  Substance and Sexual Activity  . Alcohol use: Yes    Alcohol/week: 1.0 standard drinks    Types: 1 Glasses of wine per week    Comment: occasional  . Drug use: No  . Sexual activity: Not Currently  Lifestyle  . Physical activity:    Days per week: Not on file    Minutes per session: Not on file  . Stress: Not on file  Relationships  . Social connections:    Talks on phone: Not on file    Gets together: Not on file    Attends religious service: Not on file    Active member of club or organization: Not on file    Attends meetings of clubs or  organizations: Not on file    Relationship status: Not on file  . Intimate partner violence:    Fear of current or ex partner: Not on file    Emotionally abused: Not on file    Physically abused: Not on file    Forced sexual activity: Not on file  Other Topics Concern  . Not on file  Social History Narrative   Right handed. Caffeine 3 cups coffee daily, BS, Married, 2 kids.      Physical Exam  Vital Signs and Nursing Notes reviewed Vitals:   05/02/19 0915 05/02/19 0930  BP: (!) 177/98 (!) 186/102  Pulse: 84 85  Resp:    Temp:    SpO2: 100% 100%    CONSTITUTIONAL: Well-appearing, NAD NEURO:  Alert and oriented x 3, no focal deficits EYES:  eyes equal and reactive ENT/NECK:  no LAD, no JVD CARDIO: Regular rate, well-perfused, normal S1 and S2 PULM:  CTAB no wheezing or rhonchi GI/GU:  normal bowel sounds, non-distended, non-tender MSK/SPINE:  No gross deformities, no edema SKIN:  no rash, atraumatic PSYCH:  Appropriate speech and behavior  Diagnostic and Interventional Summary    EKG Interpretation  Date/Time:  Friday May 02 2019 02:21:57 EDT Ventricular Rate:  84 PR Interval:  154 QRS Duration: 72 QT Interval:  418 QTC Calculation: 493 R Axis:   42 Text Interpretation:  Normal sinus rhythm Possible Left atrial enlargement Cannot rule out Anterior infarct , age undetermined Abnormal ECG Confirmed by Gerlene Fee [phone]) on 05/02/2019 7:09:50 AM      Labs Reviewed  CBC WITH DIFFERENTIAL/PLATELET - Abnormal; Notable for the following components:      Result Value   RBC 3.12 (*)    Hemoglobin 9.3 (*)    HCT 27.9 (*)    RDW 16.9 (*)    Platelets 401 (*)    Abs Immature Granulocytes 0.10 (*)    All other components within normal limits  COMPREHENSIVE METABOLIC PANEL - Abnormal; Notable for the following components:   Sodium 129 (*)    Chloride 97 (*)    CO2 20 (*)    Glucose, Bld 127 (*)    BUN 24 (*)    Creatinine, Ser 1.47 (*)    Albumin 2.6 (*)    AST 52  (*)    GFR calc non Af Amer 38 (*)  GFR calc Af Amer 44 (*)    All other components within normal limits  TROPONIN I  URINALYSIS, ROUTINE W REFLEX MICROSCOPIC    DG Chest 2 View  Final Result      Medications  sodium chloride 0.9 % bolus 1,000 mL (1,000 mLs Intravenous New Bag/Given 05/02/19 0929)  hydrocortisone (CORTEF) tablet 5 mg (5 mg Oral Given 05/02/19 0753)     Procedures Critical Care  ED Course and Medical Decision Making  I have reviewed the triage vital signs and the nursing notes.  Pertinent labs & imaging results that were available during my care of the patient were reviewed by me and considered in my medical decision making (see below for details).  Favoring mild exacerbation of adrenal insufficiency given the recently decreased steroid dosage at home.  Patient is hemodynamically stable, well-appearing, normal neurological exam, normal range of motion of all joints.  No respiratory distress, lungs clear, chest x-ray normal.  EKG without concerns, labs revealing mild hyponatremia, mild AKI, potassium normal.  It appears the patient simply needs to go back to her normal dose of hydrocortisone and follow-up closely with her endocrinologist, currently I do not see a clear reason for admission, will provide IV fluids here, small dose of extra hydrocortisone to aid recovery.  Work-up unremarkable, received IV fluids here, patient will call her regular prescriber and return to her normal steroid dosing.  Admission was offered, however patient prefers discharge given that her husband's brother recently passed away and there are some funeral services that she would like to attend tomorrow.  Advised strict return precautions, advised repeat blood testing within 3 days to ensure her electrolytes are improving, if not with PCP then here in the emergency department.  After the discussed management above, the patient was determined to be safe for discharge.  The patient was in agreement  with this plan and all questions regarding their care were answered.  ED return precautions were discussed and the patient will return to the ED with any significant worsening of condition.  Barth Kirks. Sedonia Small, MD [place] [email or web address]  Final Clinical Impressions(s) / ED Diagnoses     ICD-10-CM   1. Body aches R52   2. Hyponatremia E87.1     ED Discharge Orders    None         Maudie Flakes, MD 05/02/19 2751    Maudie Flakes, MD 05/02/19 1006

## 2019-05-02 NOTE — Discharge Instructions (Addendum)
You were evaluated in the Emergency Department and after careful evaluation, we did not find any emergent condition requiring admission or further testing in the hospital.  Your testing today was reassuring.  As discussed, please increase your steroid dose back to the normal dose and call your prescribing doctor soon as possible to discuss your dosing.  We recommend repeat blood testing within the next 3 days to make sure you are numbers are improving.  Please return to the Emergency Department if you experience any worsening of your condition.  We encourage you to follow up with a primary care provider.  Thank you for allowing [place] to be a part of your care.

## 2019-05-02 NOTE — ED Triage Notes (Signed)
C/o shortness of breathe; reported was recently hospitalized for adrenal crisis and was discharged beginning of May. Pt reported has been on steroid since then, and has had SOB for 2-3 weeks now

## 2019-05-12 ENCOUNTER — Other Ambulatory Visit: Payer: Self-pay | Admitting: Orthopedic Surgery

## 2019-05-14 ENCOUNTER — Encounter (HOSPITAL_BASED_OUTPATIENT_CLINIC_OR_DEPARTMENT_OTHER): Payer: Self-pay | Admitting: *Deleted

## 2019-05-14 ENCOUNTER — Other Ambulatory Visit: Payer: Self-pay

## 2019-05-14 NOTE — Progress Notes (Signed)
Patient has a hx of adrenal insufficiency and recently had abnormal lab work. Her Endocrinologist is Dr Buddy Duty and he is aware she is having hand surgery and is OK to do so, he does want her to double her dosage of cortef DOS.

## 2019-05-19 ENCOUNTER — Other Ambulatory Visit (HOSPITAL_COMMUNITY)
Admission: RE | Admit: 2019-05-19 | Discharge: 2019-05-19 | Disposition: A | Discharge status: Home / Self Care | Payer: 59 | Source: Ambulatory Visit | Attending: Orthopedic Surgery | Admitting: Orthopedic Surgery

## 2019-05-19 DIAGNOSIS — Z1159 Encounter for screening for other viral diseases: Secondary | ICD-10-CM | POA: Insufficient documentation

## 2019-05-19 LAB — SARS CORONAVIRUS 2 (TAT 6-24 HRS): SARS Coronavirus 2: NEGATIVE

## 2019-05-20 ENCOUNTER — Encounter: Payer: Self-pay | Admitting: Family Medicine

## 2019-05-20 ENCOUNTER — Telehealth (INDEPENDENT_AMBULATORY_CARE_PROVIDER_SITE_OTHER): Payer: 59 | Admitting: Family Medicine

## 2019-05-20 DIAGNOSIS — G43909 Migraine, unspecified, not intractable, without status migrainosus: Secondary | ICD-10-CM | POA: Diagnosis not present

## 2019-05-20 NOTE — Progress Notes (Signed)
PATIENT: Crystal Larsen DOB: [birth year]  REASON FOR VISIT: follow up HISTORY FROM: patient  Virtual Visit via Telephone Note  I connected with Crystal Larsen on 05/20/19 at 11:00 AM EDT by telephone and verified that I am speaking with the correct person using two identifiers.   I discussed the limitations, risks, security and privacy concerns of performing an evaluation and management service by telephone and the availability of in person appointments. I also discussed with the patient that there may be a patient responsible charge related to this service. The patient expressed understanding and agreed to proceed.   History of Present Illness:  05/20/19 Crystal Larsen is a 63 y.o. female here today for follow up of post concussive syndrome.  She reports that she is doing well overall.  She has had 3 headaches over the past 6 to 8 months.  She reports that these headaches resolve spontaneously without treatment.  She does continue to have visual disturbance prior to headache.  She reports that some instances are simply just the visual disturbance and no headache.  She does continue Depakote 250 mg daily.  She is tolerating this medication well with no obvious adverse effects.   History (copied from Dr Dohmeier's note on 03/28/2019)  HISTORY OF PRESENT ILLNESS: Today is  03/27/18 , and I see the patient for a RV. She is doing well.  This is good reports that she has done very well on Depakote as a headache preventative but she does have a period of 2 to 3 weeks with almost relentless headaches she woke up this time, she went to bed with son and they have meanwhile resolved she is not sure what brought them on what led to their disappearance.  Overall the patient is doing very well she is looking healthy and feeling well at this time.  I will refill her medication today I think it would be fine if we need once a year from now on.  Crystal Larsen is a 64 year old female with a history of  postconcussive syndrome. She returns today for follow-up. Her headaches had essentially resolved until about 2-3 months ago. She states that she has 2 types of headaches. She reports that she will get an aura that consists of changes with her vision. She reports that those changes including blurry vision and the inability to focus on objects. She reports that this will last 30-45 minutes but is typically never followed by a headache. She states that it occurs approximately once every 3 weeks. She does note that fluorescent lights and bright lights are triggers for these events. She reports that she does have headaches that occur consecutively for 6-8 days a month. These are not associated with the visual changes.  These headaches are typically located in the frontal region and behind the eyes. She denies photophobia and phonophobia as well as nausea and vomiting. She states that she does feel that she has a fullness in the maxillary regions bilaterally. She is on four anti-histamines due to itching. She does not feel that the anti-histamines has made the headaches worse or better. In the past she has been on Depakote and that essentially resolved her headaches. She returns today for an evaluation.  HISTORY 07/11/16: Crystal Larsen is a 63year old female with a history of postconcussive syndrome- The concussion occurred 24 years ago. She was originally referred by Dr. Nolon Rod in 2015. The patient was taking Depakote for migraine- after the last visit she decreased her dose by  half- taking 250 mg daily and meanwhile discontinued the medication completely.  The patient reports that she has not had any migraine headaches. She continues to notice some changes in her memory. She has trouble remembering conversations but can recall it later on. Denies having to give up anything due to her perceived memory disturbance. She is able to complete all ADLs independently. She operates a Teacher, music without difficulty. Denies  any new neurological symptoms. She returns today for an evaluation after having had 3-4 migrainous auras without any pain. She called the office and was told to take Depakote as a pain medication, not a preventive. I explained today that we should use IV Depakote for headaches, not for aura. She is here for a repeat MOCA. Crystal Larsen reports that she sometimes misses parts of the conversation that her family members may recall. She especially mentioned an emergency room visit after which she was convinced that she was told she had muscle spasms but neither her husband nor her daughter recalls her being given a diagnosis at all.    Observations/Objective:  Generalized: Well developed, in no acute distress  Mentation: Alert oriented to time, place, history taking. Follows all commands speech and language fluent   Assessment and Plan:  63 y.o. year old female  has a past medical history of Acute kidney injury ([place]), Adrenal insufficiency ([place]), Anemia, Arthritis, Depression, Elevated liver enzymes, Gallstones, GERD (gastroesophageal reflux disease), History of gout, HTN (hypertension), Hypothyroidism, IBS (irritable bowel syndrome), Metabolic acidosis, Migraine, Neuropathy, Obesity, Pancreatitis, PONV (postoperative nausea and vomiting) (1981 X 1), Seizures ([place]) (2003), Sleep apnea, Spondylolisthesis of lumbar region, Unsteady gait, and Wears glasses. here with    ICD-10-CM   1. Migraine syndrome  G43.909    Crystal Larsen continues to do well on Depakote 250 mg daily.  She has had about 3 headaches in the last 6 to 8 months.  She does not require any abortive therapy at this time.  She was encouraged to continue with healthy lifestyle.  Regular exercise advised.  Adequate hydration discussed.  She was encouraged to follow-up annually, sooner with any new or worsening symptoms.  She verbalizes understanding and agreement with this plan.  No orders of the defined types were placed in this encounter.    No orders of the defined types were placed in this encounter.    Follow Up Instructions:  I discussed the assessment and treatment plan with the patient. The patient was provided an opportunity to ask questions and all were answered. The patient agreed with the plan and demonstrated an understanding of the instructions.   The patient was advised to call back or seek an in-person evaluation if the symptoms worsen or if the condition fails to improve as anticipated.  I provided 20 minutes of non-face-to-face time during this encounter.  Patient is located at her place of residence during video conference.  Provider is located at her place of residence.  Maryelizabeth Kaufmann, CMA helped to facilitate visit.   [name], NP

## 2019-05-22 ENCOUNTER — Ambulatory Visit (HOSPITAL_BASED_OUTPATIENT_CLINIC_OR_DEPARTMENT_OTHER)
Admission: RE | Admit: 2019-05-22 | Discharge: 2019-05-22 | Disposition: A | Discharge status: Home / Self Care | Payer: 59 | Attending: Orthopedic Surgery | Admitting: Orthopedic Surgery

## 2019-05-22 ENCOUNTER — Encounter (HOSPITAL_BASED_OUTPATIENT_CLINIC_OR_DEPARTMENT_OTHER)
Admission: RE | Disposition: A | Discharge status: Home / Self Care | Payer: Self-pay | Source: Home / Self Care | Attending: Orthopedic Surgery

## 2019-05-22 ENCOUNTER — Encounter (HOSPITAL_BASED_OUTPATIENT_CLINIC_OR_DEPARTMENT_OTHER): Payer: Self-pay | Admitting: Anesthesiology

## 2019-05-22 ENCOUNTER — Ambulatory Visit (HOSPITAL_BASED_OUTPATIENT_CLINIC_OR_DEPARTMENT_OTHER): Payer: 59 | Admitting: Anesthesiology

## 2019-05-22 ENCOUNTER — Other Ambulatory Visit: Payer: Self-pay

## 2019-05-22 DIAGNOSIS — M189 Osteoarthritis of first carpometacarpal joint, unspecified: Secondary | ICD-10-CM | POA: Insufficient documentation

## 2019-05-22 DIAGNOSIS — Z885 Allergy status to narcotic agent status: Secondary | ICD-10-CM | POA: Insufficient documentation

## 2019-05-22 DIAGNOSIS — E039 Hypothyroidism, unspecified: Secondary | ICD-10-CM | POA: Insufficient documentation

## 2019-05-22 DIAGNOSIS — G5600 Carpal tunnel syndrome, unspecified upper limb: Secondary | ICD-10-CM | POA: Diagnosis present

## 2019-05-22 DIAGNOSIS — G4730 Sleep apnea, unspecified: Secondary | ICD-10-CM | POA: Diagnosis not present

## 2019-05-22 DIAGNOSIS — G5603 Carpal tunnel syndrome, bilateral upper limbs: Secondary | ICD-10-CM | POA: Insufficient documentation

## 2019-05-22 DIAGNOSIS — N289 Disorder of kidney and ureter, unspecified: Secondary | ICD-10-CM | POA: Diagnosis not present

## 2019-05-22 DIAGNOSIS — F329 Major depressive disorder, single episode, unspecified: Secondary | ICD-10-CM | POA: Diagnosis not present

## 2019-05-22 DIAGNOSIS — G629 Polyneuropathy, unspecified: Secondary | ICD-10-CM | POA: Diagnosis not present

## 2019-05-22 DIAGNOSIS — K219 Gastro-esophageal reflux disease without esophagitis: Secondary | ICD-10-CM | POA: Diagnosis not present

## 2019-05-22 DIAGNOSIS — Z881 Allergy status to other antibiotic agents status: Secondary | ICD-10-CM | POA: Diagnosis not present

## 2019-05-22 DIAGNOSIS — Z882 Allergy status to sulfonamides status: Secondary | ICD-10-CM | POA: Insufficient documentation

## 2019-05-22 DIAGNOSIS — Z888 Allergy status to other drugs, medicaments and biological substances status: Secondary | ICD-10-CM | POA: Insufficient documentation

## 2019-05-22 DIAGNOSIS — I10 Essential (primary) hypertension: Secondary | ICD-10-CM | POA: Diagnosis not present

## 2019-05-22 HISTORY — DX: Unspecified adrenocortical insufficiency: E27.40

## 2019-05-22 HISTORY — PX: CARPOMETACARPEL SUSPENSION PLASTY: SHX5005

## 2019-05-22 HISTORY — PX: CARPAL TUNNEL RELEASE: SHX101

## 2019-05-22 HISTORY — DX: Anemia, unspecified: D64.9

## 2019-05-22 SURGERY — CARPAL TUNNEL RELEASE
Anesthesia: General | Site: Wrist | Laterality: Right

## 2019-05-22 MED ORDER — LIDOCAINE HCL (CARDIAC) PF 100 MG/5ML IV SOSY
PREFILLED_SYRINGE | INTRAVENOUS | Status: DC | PRN
  Administered 2019-05-22: 60 mg via INTRAVENOUS

## 2019-05-22 MED ORDER — LIDOCAINE 2% (20 MG/ML) 5 ML SYRINGE
INTRAMUSCULAR | Status: AC
  Filled 2019-05-22: qty 5

## 2019-05-22 MED ORDER — FENTANYL CITRATE (PF) 100 MCG/2ML IJ SOLN
INTRAMUSCULAR | Status: AC
  Filled 2019-05-22: qty 2

## 2019-05-22 MED ORDER — FENTANYL CITRATE (PF) 100 MCG/2ML IJ SOLN
50.0000 ug | INTRAMUSCULAR | Status: AC | PRN
  Administered 2019-05-22 (×2): 25 ug via INTRAVENOUS
  Administered 2019-05-22: 50 ug via INTRAVENOUS

## 2019-05-22 MED ORDER — PHENYLEPHRINE HCL (PRESSORS) 10 MG/ML IV SOLN
INTRAVENOUS | Status: DC | PRN
  Administered 2019-05-22 (×2): 40 ug via INTRAVENOUS

## 2019-05-22 MED ORDER — OXYCODONE-ACETAMINOPHEN 5-325 MG PO TABS: 1.0000 | ORAL_TABLET | ORAL | 0 refills | Status: DC | PRN

## 2019-05-22 MED ORDER — CHLORHEXIDINE GLUCONATE 4 % EX LIQD: 60.0000 mL | Freq: Once | CUTANEOUS | Status: DC

## 2019-05-22 MED ORDER — MIDAZOLAM HCL 2 MG/2ML IJ SOLN
INTRAMUSCULAR | Status: AC
  Filled 2019-05-22: qty 2

## 2019-05-22 MED ORDER — MIDAZOLAM HCL 2 MG/2ML IJ SOLN
1.0000 mg | INTRAMUSCULAR | Status: DC | PRN
  Administered 2019-05-22: 2 mg via INTRAVENOUS

## 2019-05-22 MED ORDER — OXYCODONE HCL 5 MG/5ML PO SOLN: 5.0000 mg | Freq: Once | ORAL | Status: DC | PRN

## 2019-05-22 MED ORDER — DEXAMETHASONE SODIUM PHOSPHATE 10 MG/ML IJ SOLN
INTRAMUSCULAR | Status: AC
  Filled 2019-05-22: qty 1

## 2019-05-22 MED ORDER — LACTATED RINGERS IV SOLN
INTRAVENOUS | Status: DC
  Administered 2019-05-22 (×2): via INTRAVENOUS

## 2019-05-22 MED ORDER — CEFAZOLIN SODIUM-DEXTROSE 2-4 GM/100ML-% IV SOLN
INTRAVENOUS | Status: AC
  Filled 2019-05-22: qty 100

## 2019-05-22 MED ORDER — FENTANYL CITRATE (PF) 100 MCG/2ML IJ SOLN: 25.0000 ug | INTRAMUSCULAR | Status: DC | PRN

## 2019-05-22 MED ORDER — SCOPOLAMINE 1 MG/3DAYS TD PT72: 1.0000 | MEDICATED_PATCH | Freq: Once | TRANSDERMAL | Status: DC

## 2019-05-22 MED ORDER — CEFAZOLIN SODIUM-DEXTROSE 2-4 GM/100ML-% IV SOLN
2.0000 g | INTRAVENOUS | Status: AC
  Administered 2019-05-22: 2 g via INTRAVENOUS

## 2019-05-22 MED ORDER — ONDANSETRON HCL 4 MG/2ML IJ SOLN
INTRAMUSCULAR | Status: AC
  Filled 2019-05-22: qty 2

## 2019-05-22 MED ORDER — 0.9 % SODIUM CHLORIDE (POUR BTL) OPTIME
TOPICAL | Status: DC | PRN
  Administered 2019-05-22: 120 mL

## 2019-05-22 MED ORDER — DEXAMETHASONE SODIUM PHOSPHATE 10 MG/ML IJ SOLN
INTRAMUSCULAR | Status: DC | PRN
  Administered 2019-05-22: 10 mg via INTRAVENOUS

## 2019-05-22 MED ORDER — OXYCODONE HCL 5 MG PO TABS: 5.0000 mg | ORAL_TABLET | Freq: Once | ORAL | Status: DC | PRN

## 2019-05-22 MED ORDER — ONDANSETRON HCL 4 MG/2ML IJ SOLN: 4.0000 mg | Freq: Once | INTRAMUSCULAR | Status: DC | PRN

## 2019-05-22 MED ORDER — PROPOFOL 10 MG/ML IV BOLUS
INTRAVENOUS | Status: DC | PRN
  Administered 2019-05-22: 150 mg via INTRAVENOUS

## 2019-05-22 MED ORDER — BUPIVACAINE-EPINEPHRINE (PF) 0.5% -1:200000 IJ SOLN
INTRAMUSCULAR | Status: DC | PRN
  Administered 2019-05-22: 30 mL via PERINEURAL

## 2019-05-22 SURGICAL SUPPLY — 67 items
APL PRP STRL LF DISP 70% ISPRP (MISCELLANEOUS) ×2
BIT DRILL PASSING CMC 1/4 FLEX (BIT) IMPLANT
BLADE MINI RND TIP GREEN BEAV (BLADE) ×4 IMPLANT
BLADE SURG 15 STRL LF DISP TIS (BLADE) ×2 IMPLANT
BLADE SURG 15 STRL SS (BLADE) ×8
BNDG CMPR 9X4 STRL LF SNTH (GAUZE/BANDAGES/DRESSINGS) ×2
BNDG COHESIVE 3X5 TAN STRL LF (GAUZE/BANDAGES/DRESSINGS) ×4 IMPLANT
BNDG ESMARK 4X9 LF (GAUZE/BANDAGES/DRESSINGS) ×4 IMPLANT
BNDG GAUZE ELAST 4 BULKY (GAUZE/BANDAGES/DRESSINGS) ×4 IMPLANT
BUTTON ALL-SUT W/BACKSTOP (Orthopedic Implant) ×2 IMPLANT
CHLORAPREP W/TINT 26 (MISCELLANEOUS) ×4 IMPLANT
CORD BIPOLAR FORCEPS 12FT (ELECTRODE) ×4 IMPLANT
COVER BACK TABLE REUSABLE LG (DRAPES) ×4 IMPLANT
COVER MAYO STAND REUSABLE (DRAPES) ×4 IMPLANT
COVER WAND RF STERILE (DRAPES) IMPLANT
CUFF TOURN SGL QUICK 18X4 (TOURNIQUET CUFF) ×4 IMPLANT
DECANTER SPIKE VIAL GLASS SM (MISCELLANEOUS) IMPLANT
DRAPE EXTREMITY T 121X128X90 (DISPOSABLE) ×4 IMPLANT
DRAPE OEC MINIVIEW 54X84 (DRAPES) ×4 IMPLANT
DRAPE SURG 17X23 STRL (DRAPES) ×4 IMPLANT
DRILL PASSING CMC 1/4 FLEX (BIT) ×4
DRSG PAD ABDOMINAL 8X10 ST (GAUZE/BANDAGES/DRESSINGS) ×4 IMPLANT
GAUZE 4X4 16PLY RFD (DISPOSABLE) IMPLANT
GAUZE SPONGE 4X4 12PLY STRL (GAUZE/BANDAGES/DRESSINGS) ×4 IMPLANT
GAUZE XEROFORM 1X8 LF (GAUZE/BANDAGES/DRESSINGS) ×4 IMPLANT
GLOVE BIOGEL PI IND STRL 7.0 (GLOVE) IMPLANT
GLOVE BIOGEL PI IND STRL 7.5 (GLOVE) IMPLANT
GLOVE BIOGEL PI IND STRL 8 (GLOVE) IMPLANT
GLOVE BIOGEL PI IND STRL 8.5 (GLOVE) ×2 IMPLANT
GLOVE BIOGEL PI INDICATOR 7.0 (GLOVE) ×2
GLOVE BIOGEL PI INDICATOR 7.5 (GLOVE) ×2
GLOVE BIOGEL PI INDICATOR 8 (GLOVE) ×2
GLOVE BIOGEL PI INDICATOR 8.5 (GLOVE) ×2
GLOVE SURG ORTHO 8.0 STRL STRW (GLOVE) ×2 IMPLANT
GLOVE SURG SS PI 7.0 STRL IVOR (GLOVE) ×2 IMPLANT
GLOVE SURG SS PI 7.5 STRL IVOR (GLOVE) ×2 IMPLANT
GLOVE SURG SS PI 8.0 STRL IVOR (GLOVE) ×2 IMPLANT
GOWN STRL REUS W/ TWL LRG LVL3 (GOWN DISPOSABLE) ×2 IMPLANT
GOWN STRL REUS W/TWL LRG LVL3 (GOWN DISPOSABLE) ×4
GOWN STRL REUS W/TWL XL LVL3 (GOWN DISPOSABLE) ×6 IMPLANT
NDL PRECISIONGLIDE 27X1.5 (NEEDLE) IMPLANT
NEEDLE PRECISIONGLIDE 27X1.5 (NEEDLE) IMPLANT
NS IRRIG 1000ML POUR BTL (IV SOLUTION) ×4 IMPLANT
PACK BASIN DAY SURGERY FS (CUSTOM PROCEDURE TRAY) ×4 IMPLANT
PAD CAST 3X4 CTTN HI CHSV (CAST SUPPLIES) ×2 IMPLANT
PADDING CAST ABS 3INX4YD NS (CAST SUPPLIES)
PADDING CAST ABS COTTON 3X4 (CAST SUPPLIES) IMPLANT
PADDING CAST COTTON 3X4 STRL (CAST SUPPLIES) ×4
SLEEVE SCD COMPRESS KNEE MED (MISCELLANEOUS) ×4 IMPLANT
SLING ARM FOAM STRAP LRG (SOFTGOODS) ×2 IMPLANT
SPLINT PLASTER CAST XFAST 3X15 (CAST SUPPLIES) IMPLANT
SPLINT PLASTER XTRA FASTSET 3X (CAST SUPPLIES) ×20
STOCKINETTE 4X48 STRL (DRAPES) ×4 IMPLANT
SUT ETHIBOND 3-0 V-5 (SUTURE) ×4 IMPLANT
SUT ETHILON 4 0 PS 2 18 (SUTURE) ×4 IMPLANT
SUT FIBERWIRE 4-0 18 DIAM BLUE (SUTURE)
SUT MERSILENE 4 0 P 3 (SUTURE) IMPLANT
SUT STEEL 3 0 (SUTURE) ×2 IMPLANT
SUT VIC AB 4-0 P-3 18XBRD (SUTURE) IMPLANT
SUT VIC AB 4-0 P2 18 (SUTURE) ×2 IMPLANT
SUT VIC AB 4-0 P3 18 (SUTURE)
SUT VICRYL 4-0 PS2 18IN ABS (SUTURE) IMPLANT
SUTURE FIBERWR 4-0 18 DIA BLUE (SUTURE) IMPLANT
SYR BULB 3OZ (MISCELLANEOUS) ×4 IMPLANT
SYR CONTROL 10ML LL (SYRINGE) IMPLANT
TOWEL GREEN STERILE FF (TOWEL DISPOSABLE) ×8 IMPLANT
UNDERPAD 30X30 (UNDERPADS AND DIAPERS) ×4 IMPLANT

## 2019-05-22 NOTE — H&P (Signed)
Crystal Larsen is an 63 y.o. female.   Chief Complaint: Numbness and tingling with pain right hand and thumb HPI: Crystal Larsen is a 63 year old right-hand-dominant female referred by Dr. Cheron Schaumann for consultation regarding pain in her hands right greater than left been going this is been going on for 3 months. This is primarily at the basilar areas of her thumbs bilaterally. She complains of stiffness in the morning sharp dull throbbing aching pain during the day especially with gripping or pinching with a VAS score 10/10 and numbness and tingling in her right hand middle fingers. He has tried copper gloves which have not work. She has use heat which has helped cold makes it worse and CBD oil. He has no history of injury to the hand or to the neck prior to this. She is not awakened at night. States there is nothing that seems to make it particularly better or worse other than pinching. She has a history of thyroid problems arthritis no history of bite diabetes or gout. Family history is positive arthritis negative for the remainder.She was referred for nerve conductions . These have been done by Dr. Tamsen Roers revealing severe carpal tunnel syndrome on her right side moderate to severe on her left with no response to the sensory component of motor delay of 9.2 on the right side.    Past Medical History:  Diagnosis Date  . Acute kidney injury ([place])   . Adrenal insufficiency ([place])   . Anemia   . Arthritis    "left foot; left hand; right shoulder; back" (11/04/2014)  . Depression   . Elevated liver enzymes   . Gallstones   . GERD (gastroesophageal reflux disease)   . History of gout   . HTN (hypertension)   . Hypothyroidism   . IBS (irritable bowel syndrome)    ?  [name] Metabolic acidosis   . Migraine    "maybe once/yr" (11/04/2014)  . Neuropathy    right leg  . Obesity   . Pancreatitis   . PONV (postoperative nausea and vomiting) 1981 X 1   only time  . Seizures ([place]) 2003    due to tramadol  reaction -- takes depakote for migraine prevention  . Sleep apnea    does not use cpap  . Spondylolisthesis of lumbar region   . Unsteady gait   . Wears glasses     Past Surgical History:  Procedure Laterality Date  . ANKLE GANGLION CYST EXCISION Right 1980's  . BACK SURGERY    . BREAST SURGERY     biopsy  . BUNIONECTOMY Left 2012  . CESAREAN SECTION  1986  . COLONOSCOPY W/ BIOPSIES AND POLYPECTOMY    . EYE SURGERY Bilateral 2012   "lasered a hole in my iris cause pressure was building up"  . LAPAROSCOPIC CHOLECYSTECTOMY  1990's  . NASAL SINUS SURGERY  1980's  . POSTERIOR LUMBAR FUSION  05/2013   L4-5; S1  . POSTERIOR LUMBAR FUSION  11/04/2014   L3-4  . SHOULDER ARTHROSCOPY W/ ROTATOR CUFF REPAIR Right 2012  . TUBAL LIGATION  1992    Family History  Problem Relation Age of Onset  . Asthma Mother   . Lung cancer Mother   . Heart disease Father   . Heart disease Maternal Grandfather        PGF  . Alcohol abuse Maternal Grandfather   . Hyperlipidemia Maternal Grandfather   . Anxiety disorder Sister   . Fibromyalgia Sister   . Nephrolithiasis Daughter  Social History:  reports that she has never smoked. She has never used smokeless tobacco. She reports current alcohol use of about 1.0 standard drinks of alcohol per week. She reports that she does not use drugs.  Allergies:  Allergies  Allergen Reactions  . Ultram [Tramadol] Other (See Comments)    seizures  . Adhesive [Tape] Rash    bandaide  . Erythromycin Rash  . Latex Rash  . Neurontin [Gabapentin] Itching  . Sulfonamide Derivatives Rash    No medications prior to admission.    No results found for this or any previous visit (from the past 48 hour(s)).  No results found.   Pertinent items are noted in HPI.  Height 5\' 7"  (1.702 m), weight 74.4 kg.  General appearance: alert, cooperative and appears stated age Head: Normocephalic, without obvious abnormality Neck: no JVD Resp: clear to auscultation  bilaterally Cardio: regular rate and rhythm, S1, S2 normal, no murmur, click, rub or gallop GI: soft, non-tender; bowel sounds normal; no masses,  no organomegaly Extremities: Numbness and tingling with pain right hand and thumb Pulses: 2+ and symmetric Skin: Skin color, texture, turgor normal. No rashes or lesions Neurologic: Grossly normal Incision/Wound: na  Assessment/Plan Assessment:  1. Primary osteoarthritis of both first carpometacarpal joints  2. Bilateral carpal tunnel syndrome  3. Cervicalgia    Plan: We have discussed the possibility of surgical decompression of the median nerve on her right side we would recommend a consultation for her neck. We have discussed the possibility of a suspension plasty on her right thumb at the same time as median nerve decompression. Like to proceed to have intervention done. She would like to have both problems dealt with. Would still recommend having her see someone for her neck in the future. She is scheduled for suspension plasty with micro link along with carpal tunnel release right hand. Pre-peri-and postoperative course been discussed along with risk complications. She is advised that there is no guarantee to the surgery the possibility of infection recurrence injury to arteries nerves tendons complete relief symptoms dystrophy. This scheduled as an outpatient under regional anesthesia.   Daryll Brod 05/22/2019, 4:58 AM

## 2019-05-22 NOTE — Op Note (Signed)
NAME: CHRISTELL STEINMILLER MEDICAL RECORD NO: [id number] DATE OF BIRTH: 05/03/1956 FACILITY: [facility] LOCATION:  SURGERY CENTER PHYSICIAN: Wynonia Sours, MD   OPERATIVE REPORT   DATE OF PROCEDURE: 05/22/19    PREOPERATIVE DIAGNOSIS:   Carpal tunnel syndrome and CMC arthritis right thumb   POSTOPERATIVE DIAGNOSIS:   Same   PROCEDURE:   Tunnel release with suspension plasty excision trapezium flexor carpi radialis transfer with micro link suture right thumb CMC joint   SURGEON: Daryll Brod, M.D.   ASSISTANT: Leanora Cover, MD   ANESTHESIA:  Regional with sedation   INTRAVENOUS FLUIDS:  Per anesthesia flow sheet.   ESTIMATED BLOOD LOSS:  Minimal.   COMPLICATIONS:  None.   SPECIMENS:  none   TOURNIQUET TIME:    Total Tourniquet Time Documented: Upper Arm (Right) - 61 minutes Total: Upper Arm (Right) - 61 minutes    DISPOSITION:  Stable to PACU.   INDICATIONS: Patient is a 63 year old female with a history of carpal tunnel syndrome numbness and tingling pain at the basilar joint of her thumb.  Nerve conduction show a markedly positive carpal tunnel syndrome with significant changes.  She has not responded to conservative treatment to the thumb and has elected undergo release of the carpal canal and suspension plasty of her right thumb.  Pre-peri-and postoperative course been discussed along with risk complications.  She is aware there is no guarantee to the surgery the possibility of infection recurrence injury to arteries nerves tendons complete relief symptoms and dystrophy.  In the preoperative area the patient is seen the extremity marked by both patient and surgeon antibiotic given.  OPERATIVE COURSE: Patient is seen in the preoperative area brought to the operating room following a supraclavicular block done by the anesthesia department.  She was prepped and draped in supine position with the right arm free.  Prep was done with ChloraPrep a three-minute dry time allowed  and timeout taken to confirm patient procedure.  The carpal tunnel was addressed first.  A longitudinal incision was made in the right palm carried down through subcutaneous tissue.  Bleeders were electrocauterized with bipolar.  The palmar fascia was split.  The superficial palmar arch was identified.  The flexor tendon the ring little finger was identified.  Retractors were placed retracting the median nerve flexor tendons radially and the ulnar nerve ulnarly.  The flexor retinaculum was then released with sharp dissection on its own ulnar border.  A right angle and saw retractor placed between skin and forearm fascia.  Deep structures were dissected free with blunt dissection.  The proximal aspect of the flexor retinaculum distal forearm fascia was then released for approximately 2 cm proximal to the wrist crease under direct vision.  The canal was explored.  It motor branch entered in the muscle distally.  Area compression of the nerve was apparent.  No further lesions were identified.  The wound was copiously irrigated with saline and closed interrupted 4 nylon sutures.  A longitudinal incision was then made over the termination of the abductor pollicis longus tendon.  This carried down through subcutaneous tissue.  Branches of the radial nerve were identified protected.  Dissection was then carried between the extensor pollicis brevis and abductor pollicis longus after I done a fixation of the radial artery proximally.  Retractors were placed protecting it.  An incision was then made after localization of the STT joint and CMC joint through the periosteum of the trapezium.  This was then elevated with blunt sharp dissection  protecting the radial artery and the radial nerve.  The trapezium was then removed removed in piecemeal manner with rondure curettes and freer elevators.  The flexor carpi radialis was identified in the base of the dissection.  There was no cartilage on the proximal aspect of the  metacarpal or distal aspect of the trapezium prior to excision.  The flexor carpi radialis tendon was then identified in the base of the wound this was placed under traction and the wrist flexed allowing the tendon to be pulled into the wound this was then cut in half transected proximally and left attached distally the dorsal half was then dissected to the base of the index metacarpal and left attached.  Was then left in the wound.  The area just distal to the insertion of the abductor pollicis longus was then dissected free for placement of the micro link suture.  This was then drilled through the base of the thumb metacarpal and through the base of the index metacarpal with the thumb pulled into distraction to the level of the normal trapezium.  Positioning was confirmed with image intensification both AP lateral oblique directions.  The micro link suture was then passed through the base of the thumb metacarpal through the base of the index metacarpal after making an incision at the egress of the guidepin.  This was dissected down to the metacarpal bone.  The suture was then tied after placing a right angle hemostat to maintain a space between the base of the thumb and index metacarpal.  And the micro link suture tightened.  The wound was copiously irrigated with saline.  The flexor carpi radialis was then woven through the insertion of the abductor pollicis longus and then back onto itself and sutured with multiple 3-0 Ethibond sutures.  X-rays AP lateral and oblique revealed that the thumb was well suspended.  The skin was closed as much as possible over the incision with interrupted 4-0 Vicryl the subcutaneous tissue with interrupted 4-0 Vicryl and the skin with interrupted 4 oh nylons on each of the wounds.  A sterile compressive dressing dorsal palmar splint was applied.  On deflation of the tourniquet all fingers immediately pink.  She was taken to the recovery room for observation in satisfactory  condition.  She will be discharged home to return the hand center [facility] in 1 week on Percocet.   Daryll Brod, MD Electronically signed, 05/22/19

## 2019-05-22 NOTE — Anesthesia Preprocedure Evaluation (Signed)
Anesthesia Evaluation  Patient identified by MRN, date of birth, ID band Patient awake    Reviewed: Allergy & Precautions, NPO status , Patient's Chart, lab work & pertinent test results  History of Anesthesia Complications (+) PONV and history of anesthetic complications  Airway Mallampati: II  TM Distance: >3 FB Neck ROM: Full    Dental no notable dental hx. (+) Teeth Intact   Pulmonary sleep apnea ,    Pulmonary exam normal breath sounds clear to auscultation       Cardiovascular hypertension, Pt. on medications Normal cardiovascular exam Rhythm:Regular Rate:Normal     Neuro/Psych  Headaches, Seizures -, Well Controlled,  PSYCHIATRIC DISORDERS Depression Dementia    GI/Hepatic Neg liver ROS, GERD  Medicated and Controlled,Hx/o IBS   Endo/Other  Hypothyroidism Adrenal insufficiency Hyperlipidemia  Renal/GU Renal InsufficiencyRenal diseaseHx/o AKI     Musculoskeletal  (+) Arthritis , Osteoarthritis,  Hx/o gout   Abdominal   Peds  Hematology  (+) anemia ,   Anesthesia Other Findings   Reproductive/Obstetrics                             Anesthesia Physical Anesthesia Plan  ASA: III  Anesthesia Plan: General   Post-op Pain Management:  Regional for Post-op pain   Induction: Intravenous  PONV Risk Score and Plan: 4 or greater and Ondansetron, Dexamethasone, Treatment may vary due to age or medical condition and Diphenhydramine  Airway Management Planned: LMA  Additional Equipment:   Intra-op Plan:   Post-operative Plan: Extubation in OR  Informed Consent: I have reviewed the patients History and Physical, chart, labs and discussed the procedure including the risks, benefits and alternatives for the proposed anesthesia with the patient or authorized representative who has indicated his/her understanding and acceptance.     Dental advisory given  Plan Discussed with: CRNA  and Surgeon  Anesthesia Plan Comments:         Anesthesia Quick Evaluation

## 2019-05-22 NOTE — Discharge Instructions (Addendum)

## 2019-05-22 NOTE — Progress Notes (Addendum)
Assisted Dr. Royce Macadamia with right, ultrasound guided, axillary block. Side rails up, monitors on throughout procedure. See vital signs in flow sheet. Tolerated Procedure well. See brief increase in BP and HR during block, and Dr Royce Macadamia aware.

## 2019-05-22 NOTE — Brief Op Note (Signed)
05/22/2019  11:22 AM  PATIENT:  Crystal Larsen  63 y.o. female  PRE-OPERATIVE DIAGNOSIS:  RIGHT CARPAL TUNNEL SYNDROME; CARPOMETACARPAL ARTHRITIS RIGHT THUMB  POST-OPERATIVE DIAGNOSIS:  RIGHT CARPAL TUNNEL SYNDROME;CARPOMETACARPAL ARTHRITIS RIGHT THUMB  PROCEDURE:  Procedure(s): RIGHT CARPAL TUNNEL RELEASE (Right) ARTHROPLASY CARPOMETACARPAL RIGHT THUMB TRAPEZIUM EXCISION TENDON TRANSFER MICRO LINK SUSPENSION (Right)  SURGEON:  Surgeon(s) and Role:    * Daryll Brod, MD - Primary    * Leanora Cover, MD - Assisting  PHYSICIAN ASSISTANT:   ASSISTANTS: K Kinsey Karch,MD   ANESTHESIA:   regional and IV sedation  EBL: 52ml BLOOD ADMINISTERED:none  DRAINS: none   LOCAL MEDICATIONS USED:  NONE  SPECIMEN:  No Specimen  DISPOSITION OF SPECIMEN:  N/A  COUNTS:  YES  TOURNIQUET:  * Missing tourniquet times found for documented tourniquets in log: [id number] *  DICTATION: .Dragon Dictation  PLAN OF CARE: Discharge to home after PACU  PATIENT DISPOSITION:  PACU - hemodynamically stable.

## 2019-05-22 NOTE — Op Note (Signed)
I assisted Surgeon(s) and Role:    * Daryll Brod, MD - Primary    * Leanora Cover, MD - Assisting on the Procedure(s): RIGHT CARPAL TUNNEL RELEASE ARTHROPLASY CARPOMETACARPAL RIGHT THUMB TRAPEZIUM EXCISION TENDON TRANSFER MICRO LINK SUSPENSION on 05/22/2019.  I provided assistance on this case as follows: retraction soft tissues, extraction bone, passage suture anchor.  Electronically signed by: Leanora Cover, MD Date: 05/22/2019 Time: 11:24 AM

## 2019-05-22 NOTE — Anesthesia Procedure Notes (Signed)
Procedure Name: LMA Insertion Date/Time: 05/22/2019 10:06 AM Performed by: Maryella Shivers, CRNA Pre-anesthesia Checklist: Patient identified, Emergency Drugs available, Suction available and Patient being monitored Patient Re-evaluated:Patient Re-evaluated prior to induction Oxygen Delivery Method: Circle system utilized Preoxygenation: Pre-oxygenation with 100% oxygen Induction Type: IV induction Ventilation: Mask ventilation without difficulty LMA: LMA inserted LMA Size: 4.0 Number of attempts: 1 Airway Equipment and Method: Bite block Placement Confirmation: positive ETCO2 Tube secured with: Tape Dental Injury: Teeth and Oropharynx as per pre-operative assessment

## 2019-05-22 NOTE — Anesthesia Procedure Notes (Signed)
Anesthesia Regional Block: Axillary brachial plexus block   Pre-Anesthetic Checklist: ,, timeout performed, Correct Patient, Correct Site, Correct Laterality, Correct Procedure, Correct Position, site marked, Risks and benefits discussed,  Surgical consent,  Pre-op evaluation,  At surgeon's request and post-op pain management  Laterality: Right  Prep: chloraprep       Needles:  Injection technique: Single-shot  Needle Type: Echogenic Stimulator Needle     Needle Length: 9cm  Needle Gauge: 21   Needle insertion depth: 4 cm   Additional Needles:   Procedures:,,,, ultrasound used (permanent image in chart),,,,  Motor weakness within 3 minutes.  Narrative:  Start time: 05/22/2019 9:25 AM End time: 05/22/2019 9:30 AM Injection made incrementally with aspirations every 5 mL.  Performed by: Personally  Anesthesiologist: Josephine Igo, MD  Additional Notes: Timeout performed. Patient sedated. Relevant anatomy ID'd using [place]. Incremental 2-65ml injection of LA with frequent aspiration. Patient tolerated procedure well.        Right Axillary Block

## 2019-05-22 NOTE — Anesthesia Postprocedure Evaluation (Signed)
Anesthesia Post Note  Patient: Crystal Larsen  Procedure(s) Performed: RIGHT CARPAL TUNNEL RELEASE (Right Wrist) ARTHROPLASY CARPOMETACARPAL RIGHT THUMB TRAPEZIUM EXCISION TENDON TRANSFER MICRO LINK SUSPENSION (Right Hand)     Patient location during evaluation: PACU Anesthesia Type: General Level of consciousness: awake and alert and oriented Pain management: pain level controlled Vital Signs Assessment: post-procedure vital signs reviewed and stable Respiratory status: spontaneous breathing, nonlabored ventilation and respiratory function stable Cardiovascular status: blood pressure returned to baseline and stable Postop Assessment: no apparent nausea or vomiting Anesthetic complications: no    Last Vitals:  Vitals:   05/22/19 1145 05/22/19 1200  BP: (!) 164/94 (!) 152/86  Pulse: 95 90  Resp: 17 11  Temp:    SpO2: 97% 94%    Last Pain:  Vitals:   05/22/19 1200  TempSrc:   PainSc: 0-No pain                 Klinton Candelas A.

## 2019-05-22 NOTE — Transfer of Care (Signed)
Immediate Anesthesia Transfer of Care Note  Patient: AMILIA VANDENBRINK  Procedure(s) Performed: RIGHT CARPAL TUNNEL RELEASE (Right Wrist) ARTHROPLASY CARPOMETACARPAL RIGHT THUMB TRAPEZIUM EXCISION TENDON TRANSFER MICRO LINK SUSPENSION (Right Hand)  Patient Location: PACU  Anesthesia Type:GA combined with regional for post-op pain  Level of Consciousness: sedated  Airway & Oxygen Therapy: Patient Spontanous Breathing and Patient connected to nasal cannula oxygen  Post-op Assessment: Report given to RN and Post -op Vital signs reviewed and stable  Post vital signs: Reviewed and stable  Last Vitals:  Vitals Value Taken Time  BP 172/106 05/22/19 1131  Temp    Pulse 100 05/22/19 1132  Resp 15 05/22/19 1132  SpO2 98 % 05/22/19 1132  Vitals shown include unvalidated device data.  Last Pain:  Vitals:   05/22/19 0900  TempSrc: Oral  PainSc: 1       Patients Stated Pain Goal: 1 (56/70/14 1030)  Complications: No apparent anesthesia complications

## 2019-05-23 ENCOUNTER — Encounter (HOSPITAL_BASED_OUTPATIENT_CLINIC_OR_DEPARTMENT_OTHER): Payer: Self-pay | Admitting: Orthopedic Surgery

## 2019-05-23 NOTE — Op Note (Signed)
Xray report: Multiple AP,lateral and oblique image intensification radiographs were performed showing the thumb well suspended with stress.

## 2019-07-29 DIAGNOSIS — IMO0002 Reserved for concepts with insufficient information to code with codable children: Secondary | ICD-10-CM

## 2019-07-29 DIAGNOSIS — M329 Systemic lupus erythematosus, unspecified: Secondary | ICD-10-CM

## 2019-07-29 HISTORY — DX: Systemic lupus erythematosus, unspecified: M32.9

## 2019-07-29 HISTORY — DX: Reserved for concepts with insufficient information to code with codable children: IMO0002

## 2019-08-19 ENCOUNTER — Other Ambulatory Visit: Payer: Self-pay | Admitting: Neurology

## 2019-11-24 ENCOUNTER — Other Ambulatory Visit: Payer: Self-pay | Admitting: Neurological Surgery

## 2019-12-04 NOTE — Progress Notes (Signed)
[place], [place] - [id number] [place] [place] [address] Phone: [phone] Fax: [place], [place] [address] [place] [address] Phone: [phone] Fax: [phone]      Your procedure is scheduled on Tuesday, December 09, 2019.  Report to [facility] Main Entrance "A" at 5:30 A.M., and check in at the Admitting office.  Call this number if you have problems the morning of surgery:  [phone]    Remember:  Do not eat or drink after midnight the night before your surgery    Take these medicines the morning of surgery with A SIP OF WATER: acetaminophen (TYLENOL)  amLODipine (NORVASC) desvenlafaxine (PRISTIQ) divalproex (DEPAKOTE ER) hydroxychloroquine (PLAQUENIL) levothyroxine (SYNTHROID)  Follow your surgeon's instructions on when to stop Aspirin.  If no instructions were given by your surgeon then you will need to call the office to get those instructions.    7 days prior to surgery STOP taking any Aspirin (unless otherwise instructed by your surgeon), Aleve, Naproxen, Ibuprofen, Motrin, Advil, Goody's, BC's, all herbal medications, fish oil, and all vitamins.    The Morning of Surgery  Do not wear jewelry, make-up or nail polish.  Do not wear lotions, powders, perfumes or deodorant  Do not shave 48 hours prior to surgery.  Do not bring valuables to the hospital.  [facility] is not responsible for any belongings or valuables.  If you are a smoker, DO NOT Smoke 24 hours prior to surgery  If you wear a CPAP at night please bring your mask, tubing, and machine the morning of surgery   Remember that you must have someone to transport you home after your surgery, and remain with you for 24 hours if you are discharged the same day.   Please bring cases for contacts, glasses, hearing aids, dentures or bridgework because it cannot be  worn into surgery.    Leave your suitcase in the car.  After surgery it may be brought to your room.  For patients admitted to the hospital, discharge time will be determined by your treatment team.  Patients discharged the day of surgery will not be allowed to drive home.    Special instructions:   [place]- Preparing For Surgery  Before surgery, you can play an important role. Because skin is not sterile, your skin needs to be as free of germs as possible. You can reduce the number of germs on your skin by washing with CHG (chlorahexidine gluconate) Soap before surgery.  CHG is an antiseptic cleaner which kills germs and bonds with the skin to continue killing germs even after washing.    Oral Hygiene is also important to reduce your risk of infection.  Remember - BRUSH YOUR TEETH THE MORNING OF SURGERY WITH YOUR REGULAR TOOTHPASTE  Please do not use if you have an allergy to CHG or antibacterial soaps. If your skin becomes reddened/irritated stop using the CHG.  Do not shave (including legs and underarms) for at least 48 hours prior to first CHG shower. It is OK to shave your face.  Please follow these instructions carefully.   1. Shower the NIGHT BEFORE SURGERY and the MORNING OF SURGERY with CHG Soap.   2. If you chose to wash your hair, wash your hair first as usual with your normal shampoo.  3. After you shampoo, rinse your hair and body thoroughly to remove the shampoo.  4.  Use CHG as you would any other liquid soap. You can apply CHG directly to the skin and wash gently with a scrungie or a clean washcloth.   5. Apply the CHG Soap to your body ONLY FROM THE NECK DOWN.  Do not use on open wounds or open sores. Avoid contact with your eyes, ears, mouth and genitals (private parts). Wash Face and genitals (private parts)  with your normal soap.   6. Wash thoroughly, paying special attention to the area where your surgery will be performed.  7. Thoroughly rinse your body  with warm water from the neck down.  8. DO NOT shower/wash with your normal soap after using and rinsing off the CHG Soap.  9. Pat yourself dry with a CLEAN TOWEL.  10. Wear CLEAN PAJAMAS to bed the night before surgery, wear comfortable clothes the morning of surgery  11. Place CLEAN SHEETS on your bed the night of your first shower and DO NOT SLEEP WITH PETS.    Day of Surgery:  Please shower the morning of surgery with the CHG soap Do not apply any deodorants/lotions. Please wear clean clothes to the hospital/surgery center.   Remember to brush your teeth WITH YOUR REGULAR TOOTHPASTE.   Please read over the following fact sheets that you were given.

## 2019-12-05 ENCOUNTER — Encounter (HOSPITAL_COMMUNITY)
Admission: RE | Admit: 2019-12-05 | Discharge: 2019-12-05 | Disposition: A | Discharge status: Home / Self Care | Payer: 59 | Source: Ambulatory Visit | Attending: Neurological Surgery | Admitting: Neurological Surgery

## 2019-12-05 ENCOUNTER — Encounter (HOSPITAL_COMMUNITY): Payer: Self-pay

## 2019-12-05 ENCOUNTER — Other Ambulatory Visit: Payer: Self-pay

## 2019-12-05 ENCOUNTER — Other Ambulatory Visit (HOSPITAL_COMMUNITY)
Admission: RE | Admit: 2019-12-05 | Discharge: 2019-12-05 | Disposition: A | Discharge status: Home / Self Care | Payer: 59 | Source: Ambulatory Visit | Attending: Neurological Surgery | Admitting: Neurological Surgery

## 2019-12-05 DIAGNOSIS — Z20822 Contact with and (suspected) exposure to covid-19: Secondary | ICD-10-CM | POA: Insufficient documentation

## 2019-12-05 DIAGNOSIS — Z01812 Encounter for preprocedural laboratory examination: Secondary | ICD-10-CM | POA: Diagnosis present

## 2019-12-05 HISTORY — DX: Anxiety disorder, unspecified: F41.9

## 2019-12-05 LAB — TYPE AND SCREEN
ABO/RH(D): O POS
Antibody Screen: NEGATIVE

## 2019-12-05 LAB — BASIC METABOLIC PANEL
Anion gap: 7 (ref 5–15)
BUN: 15 mg/dL (ref 8–23)
CO2: 26 mmol/L (ref 22–32)
Calcium: 9.7 mg/dL (ref 8.9–10.3)
Chloride: 104 mmol/L (ref 98–111)
Creatinine, Ser: 1.07 mg/dL — ABNORMAL HIGH (ref 0.44–1.00)
GFR calc Af Amer: >60 mL/min (ref >60)
GFR calc non Af Amer: 55 mL/min — ABNORMAL LOW (ref >60)
Glucose, Bld: 96 mg/dL (ref 70–99)
Potassium: 3.5 mmol/L (ref 3.5–5.1)
Sodium: 137 mmol/L (ref 135–145)

## 2019-12-05 LAB — CBC
HCT: 41.5 % (ref 36.0–46.0)
Hemoglobin: 13.1 g/dL (ref 12.0–15.0)
MCH: 28.7 pg (ref 26.0–34.0)
MCHC: 31.6 g/dL (ref 30.0–36.0)
MCV: 90.8 fL (ref 80.0–100.0)
Platelets: 326 10*3/uL (ref 150–400)
RBC: 4.57 MIL/uL (ref 3.87–5.11)
RDW: 14.1 % (ref 11.5–15.5)
WBC: 6.4 10*3/uL (ref 4.0–10.5)
nRBC: 0 % (ref 0.0–0.2)

## 2019-12-05 LAB — SURGICAL PCR SCREEN
MRSA, PCR: NEGATIVE
Staphylococcus aureus: NEGATIVE

## 2019-12-05 NOTE — Progress Notes (Signed)
PCP - Jonathon Jordan Cardiologist - denies  Chest x-ray - 05-02-19 EKG - 05-05-19  SA - yes, does not wear CPAP per MD, mild case  Aspirin Instructions: Pt stated she stopped ASA on 12-02-19  COVID TEST- Friday, 12-05-19   Anesthesia review: yes, EKG  Patient denies shortness of breath, fever, cough and chest pain at PAT appointment   All instructions explained to the patient, with a verbal understanding of the material. Patient agrees to go over the instructions while at home for a better understanding. Patient also instructed to self quarantine after being tested for COVID-19. The opportunity to ask questions was provided.

## 2019-12-06 LAB — NOVEL CORONAVIRUS, NAA (HOSP ORDER, SEND-OUT TO REF LAB; TAT 18-24 HRS): SARS-CoV-2, NAA: NOT DETECTED

## 2019-12-08 NOTE — Progress Notes (Signed)
Anesthesia Chart Review:  Hospitalized May 2020 for AKI and hyponatremia thought secondary to adrenal insufficiency, discharged with endocrine followup. Per patient, endocrinologist Dr. Buddy Duty felt it was not adrenal insufficiency and referred pt to to rheumatology, Dr Trudie Reed. Pt subsequently diagnosed with Lupus and now on plaquenil and prednisone 10mg /day. She says she feels significantly better since starting on these medications.   Follows with neurology for chronic postconcussive migraines, controlled on depakote.   Preop labs reviewed, WNL.  EKG 05/02/19: Normal sinus rhythm. Rate 84. Possible Left atrial enlargement. Cannot rule out Anterior infarct , age undetermined.  Wynonia Musty [facility] Short Stay Center/Anesthesiology Phone [phone] 12/08/2019 2:04 PM

## 2019-12-08 NOTE — Anesthesia Preprocedure Evaluation (Addendum)
Anesthesia Evaluation  Patient identified by MRN, date of birth, ID band Patient awake    Reviewed: Allergy & Precautions, NPO status , Patient's Chart, lab work & pertinent test results  History of Anesthesia Complications (+) PONV and history of anesthetic complications  Airway Mallampati: II  TM Distance: >3 FB Neck ROM: Full    Dental  (+) Teeth Intact, Dental Advisory Given   Pulmonary sleep apnea ,    Pulmonary exam normal breath sounds clear to auscultation       Cardiovascular hypertension, Pt. on medications (-) angina(-) CAD, (-) Past MI and (-) Cardiac Stents Normal cardiovascular exam Rhythm:Regular Rate:Normal     Neuro/Psych  Headaches, Seizures -,  PSYCHIATRIC DISORDERS Anxiety Spinal stenosis, Lumbar region with neurogenic claudication    GI/Hepatic Neg liver ROS, GERD  ,  Endo/Other  Hypothyroidism Lupus  Renal/GU Renal disease     Musculoskeletal  (+) Arthritis ,   Abdominal   Peds  Hematology negative hematology ROS (+)   Anesthesia Other Findings Day of surgery medications reviewed with the patient.  Reproductive/Obstetrics                           Anesthesia Physical Anesthesia Plan  ASA: III  Anesthesia Plan: General   Post-op Pain Management:    Induction:   PONV Risk Score and Plan: 4 or greater and Midazolam, Scopolamine patch - Pre-op, Dexamethasone, Ondansetron, Diphenhydramine and Propofol infusion  Airway Management Planned: Oral ETT and Video Laryngoscope Planned  Additional Equipment:   Intra-op Plan:   Post-operative Plan: Extubation in OR  Informed Consent: I have reviewed the patients History and Physical, chart, labs and discussed the procedure including the risks, benefits and alternatives for the proposed anesthesia with the patient or authorized representative who has indicated his/her understanding and acceptance.     Dental advisory  given  Plan Discussed with: CRNA  Anesthesia Plan Comments: (Hospitalized May 2020 for AKI and hyponatremia thought secondary to adrenal insufficiency, discharged with endocrine followup. Per patient, endocrinologist Dr. Buddy Duty felt it was not adrenal insufficiency and referred pt to to rheumatology, Dr Trudie Reed. Pt subsequently diagnosed with Lupus and now on plaquenil and prednisone 10mg /day. She says she feels significantly better since starting on these medications.   Follows with neurology for chronic postconcussive migraines, controlled on depakote.   Preop labs reviewed, WNL.  EKG 05/02/19: Normal sinus rhythm. Rate 84. Possible Left atrial enlargement. Cannot rule out Anterior infarct , age undetermined.  )     Anesthesia Quick Evaluation

## 2019-12-09 ENCOUNTER — Encounter (HOSPITAL_COMMUNITY): Payer: Self-pay | Admitting: Neurological Surgery

## 2019-12-09 ENCOUNTER — Inpatient Hospital Stay (HOSPITAL_COMMUNITY): Payer: 59 | Admitting: Physician Assistant

## 2019-12-09 ENCOUNTER — Inpatient Hospital Stay (HOSPITAL_COMMUNITY): Payer: 59

## 2019-12-09 ENCOUNTER — Inpatient Hospital Stay (HOSPITAL_COMMUNITY): Payer: 59 | Admitting: Anesthesiology

## 2019-12-09 ENCOUNTER — Inpatient Hospital Stay (HOSPITAL_COMMUNITY)
Admission: RE | Admit: 2019-12-09 | Discharge: 2019-12-10 | DRG: 460 | Disposition: A | Discharge status: Home / Self Care | Payer: 59 | Attending: Neurological Surgery | Admitting: Neurological Surgery

## 2019-12-09 ENCOUNTER — Other Ambulatory Visit: Payer: Self-pay

## 2019-12-09 ENCOUNTER — Encounter (HOSPITAL_COMMUNITY)
Admission: RE | Disposition: A | Discharge status: Home / Self Care | Payer: Self-pay | Source: Home / Self Care | Attending: Neurological Surgery

## 2019-12-09 DIAGNOSIS — M40209 Unspecified kyphosis, site unspecified: Secondary | ICD-10-CM | POA: Diagnosis present

## 2019-12-09 DIAGNOSIS — K219 Gastro-esophageal reflux disease without esophagitis: Secondary | ICD-10-CM | POA: Diagnosis present

## 2019-12-09 DIAGNOSIS — K589 Irritable bowel syndrome without diarrhea: Secondary | ICD-10-CM | POA: Diagnosis present

## 2019-12-09 DIAGNOSIS — Z20822 Contact with and (suspected) exposure to covid-19: Secondary | ICD-10-CM | POA: Diagnosis present

## 2019-12-09 DIAGNOSIS — Z79899 Other long term (current) drug therapy: Secondary | ICD-10-CM

## 2019-12-09 DIAGNOSIS — F419 Anxiety disorder, unspecified: Secondary | ICD-10-CM | POA: Diagnosis present

## 2019-12-09 DIAGNOSIS — M4726 Other spondylosis with radiculopathy, lumbar region: Secondary | ICD-10-CM | POA: Diagnosis present

## 2019-12-09 DIAGNOSIS — Z888 Allergy status to other drugs, medicaments and biological substances status: Secondary | ICD-10-CM | POA: Diagnosis not present

## 2019-12-09 DIAGNOSIS — E039 Hypothyroidism, unspecified: Secondary | ICD-10-CM | POA: Diagnosis present

## 2019-12-09 DIAGNOSIS — Z8249 Family history of ischemic heart disease and other diseases of the circulatory system: Secondary | ICD-10-CM | POA: Diagnosis not present

## 2019-12-09 DIAGNOSIS — M19011 Primary osteoarthritis, right shoulder: Secondary | ICD-10-CM | POA: Diagnosis present

## 2019-12-09 DIAGNOSIS — Z882 Allergy status to sulfonamides status: Secondary | ICD-10-CM | POA: Diagnosis not present

## 2019-12-09 DIAGNOSIS — M19072 Primary osteoarthritis, left ankle and foot: Secondary | ICD-10-CM | POA: Diagnosis present

## 2019-12-09 DIAGNOSIS — G629 Polyneuropathy, unspecified: Secondary | ICD-10-CM | POA: Diagnosis present

## 2019-12-09 DIAGNOSIS — M48061 Spinal stenosis, lumbar region without neurogenic claudication: Secondary | ICD-10-CM | POA: Diagnosis present

## 2019-12-09 DIAGNOSIS — G4730 Sleep apnea, unspecified: Secondary | ICD-10-CM | POA: Diagnosis present

## 2019-12-09 DIAGNOSIS — Z419 Encounter for procedure for purposes other than remedying health state, unspecified: Secondary | ICD-10-CM

## 2019-12-09 DIAGNOSIS — Z9109 Other allergy status, other than to drugs and biological substances: Secondary | ICD-10-CM

## 2019-12-09 DIAGNOSIS — E2740 Unspecified adrenocortical insufficiency: Secondary | ICD-10-CM | POA: Diagnosis present

## 2019-12-09 DIAGNOSIS — Z885 Allergy status to narcotic agent status: Secondary | ICD-10-CM | POA: Diagnosis not present

## 2019-12-09 DIAGNOSIS — Z7982 Long term (current) use of aspirin: Secondary | ICD-10-CM

## 2019-12-09 DIAGNOSIS — G43909 Migraine, unspecified, not intractable, without status migrainosus: Secondary | ICD-10-CM | POA: Diagnosis present

## 2019-12-09 DIAGNOSIS — Z881 Allergy status to other antibiotic agents status: Secondary | ICD-10-CM

## 2019-12-09 DIAGNOSIS — Z79890 Hormone replacement therapy (postmenopausal): Secondary | ICD-10-CM

## 2019-12-09 DIAGNOSIS — Z91040 Latex allergy status: Secondary | ICD-10-CM | POA: Diagnosis not present

## 2019-12-09 DIAGNOSIS — M109 Gout, unspecified: Secondary | ICD-10-CM | POA: Diagnosis present

## 2019-12-09 DIAGNOSIS — Z7952 Long term (current) use of systemic steroids: Secondary | ICD-10-CM

## 2019-12-09 DIAGNOSIS — M545 Low back pain: Secondary | ICD-10-CM | POA: Diagnosis present

## 2019-12-09 DIAGNOSIS — I10 Essential (primary) hypertension: Secondary | ICD-10-CM | POA: Diagnosis present

## 2019-12-09 DIAGNOSIS — M19042 Primary osteoarthritis, left hand: Secondary | ICD-10-CM | POA: Diagnosis present

## 2019-12-09 HISTORY — PX: ANTERIOR LAT LUMBAR FUSION: SHX1168

## 2019-12-09 SURGERY — ANTERIOR LATERAL LUMBAR FUSION 1 LEVEL
Anesthesia: General

## 2019-12-09 MED ORDER — ONDANSETRON HCL 4 MG/2ML IJ SOLN: 4.0000 mg | Freq: Four times a day (QID) | INTRAMUSCULAR | Status: DC | PRN

## 2019-12-09 MED ORDER — VENLAFAXINE HCL ER 75 MG PO CP24
75.0000 mg | ORAL_CAPSULE | Freq: Every day | ORAL | Status: DC
  Administered 2019-12-10: 10:00:00 75 mg via ORAL
  Filled 2019-12-09: qty 1

## 2019-12-09 MED ORDER — LACTATED RINGERS IV SOLN: INTRAVENOUS | Status: DC

## 2019-12-09 MED ORDER — THROMBIN 5000 UNITS EX SOLR
CUTANEOUS | Status: AC
  Filled 2019-12-09: qty 5000

## 2019-12-09 MED ORDER — ONDANSETRON HCL 4 MG/2ML IJ SOLN
INTRAMUSCULAR | Status: AC
  Filled 2019-12-09: qty 2

## 2019-12-09 MED ORDER — LIDOCAINE 2% (20 MG/ML) 5 ML SYRINGE
INTRAMUSCULAR | Status: DC | PRN
  Administered 2019-12-09: 80 mg via INTRAVENOUS

## 2019-12-09 MED ORDER — FLEET ENEMA 7-19 GM/118ML RE ENEM: 1.0000 | ENEMA | Freq: Once | RECTAL | Status: DC | PRN

## 2019-12-09 MED ORDER — ACETAMINOPHEN 500 MG PO TABS: 1000.0000 mg | ORAL_TABLET | Freq: Once | ORAL | Status: DC

## 2019-12-09 MED ORDER — ONDANSETRON HCL 4 MG/2ML IJ SOLN
4.0000 mg | Freq: Once | INTRAMUSCULAR | Status: AC
  Administered 2019-12-09: 12:00:00 4 mg via INTRAVENOUS

## 2019-12-09 MED ORDER — ONDANSETRON HCL 4 MG PO TABS: 4.0000 mg | ORAL_TABLET | Freq: Four times a day (QID) | ORAL | Status: DC | PRN

## 2019-12-09 MED ORDER — CEFAZOLIN SODIUM-DEXTROSE 2-4 GM/100ML-% IV SOLN
2.0000 g | INTRAVENOUS | Status: AC
  Administered 2019-12-09: 2 g via INTRAVENOUS

## 2019-12-09 MED ORDER — DOCUSATE SODIUM 100 MG PO CAPS
100.0000 mg | ORAL_CAPSULE | Freq: Two times a day (BID) | ORAL | Status: DC
  Administered 2019-12-09 – 2019-12-10 (×3): 100 mg via ORAL
  Filled 2019-12-09 (×3): qty 1

## 2019-12-09 MED ORDER — THROMBIN 5000 UNITS EX SOLR
OROMUCOSAL | Status: DC | PRN
  Administered 2019-12-09: 09:00:00 5 mL via TOPICAL

## 2019-12-09 MED ORDER — ONDANSETRON HCL 4 MG/2ML IJ SOLN
INTRAMUSCULAR | Status: DC | PRN
  Administered 2019-12-09: 4 mg via INTRAVENOUS

## 2019-12-09 MED ORDER — SODIUM CHLORIDE 0.9 % IV SOLN: 250.0000 mL | INTRAVENOUS | Status: DC

## 2019-12-09 MED ORDER — DIVALPROEX SODIUM ER 250 MG PO TB24
250.0000 mg | ORAL_TABLET | Freq: Every day | ORAL | Status: DC
  Administered 2019-12-10: 10:00:00 250 mg via ORAL
  Filled 2019-12-09: qty 1

## 2019-12-09 MED ORDER — MIDAZOLAM HCL 5 MG/5ML IJ SOLN
INTRAMUSCULAR | Status: DC | PRN
  Administered 2019-12-09: 2 mg via INTRAVENOUS

## 2019-12-09 MED ORDER — MELATONIN 3 MG PO TABS
9.0000 mg | ORAL_TABLET | Freq: Every day | ORAL | Status: DC
  Filled 2019-12-09: qty 3

## 2019-12-09 MED ORDER — LIDOCAINE 2% (20 MG/ML) 5 ML SYRINGE
INTRAMUSCULAR | Status: AC
  Filled 2019-12-09: qty 5

## 2019-12-09 MED ORDER — ACETAMINOPHEN 650 MG RE SUPP: 650.0000 mg | RECTAL | Status: DC | PRN

## 2019-12-09 MED ORDER — LIDOCAINE-EPINEPHRINE 1 %-1:100000 IJ SOLN
INTRAMUSCULAR | Status: AC
  Filled 2019-12-09: qty 1

## 2019-12-09 MED ORDER — KETOROLAC TROMETHAMINE 15 MG/ML IJ SOLN
INTRAMUSCULAR | Status: AC
  Filled 2019-12-09: qty 1

## 2019-12-09 MED ORDER — CHLORHEXIDINE GLUCONATE CLOTH 2 % EX PADS: 6.0000 | MEDICATED_PAD | Freq: Once | CUTANEOUS | Status: DC

## 2019-12-09 MED ORDER — OXYCODONE-ACETAMINOPHEN 5-325 MG PO TABS
1.0000 | ORAL_TABLET | ORAL | Status: DC | PRN
  Administered 2019-12-09: 2 via ORAL
  Administered 2019-12-09: 21:00:00 1 via ORAL
  Filled 2019-12-09: qty 1
  Filled 2019-12-09: qty 2

## 2019-12-09 MED ORDER — AMLODIPINE BESYLATE 5 MG PO TABS
10.0000 mg | ORAL_TABLET | Freq: Every day | ORAL | Status: DC
  Administered 2019-12-10: 10:00:00 10 mg via ORAL
  Filled 2019-12-09: qty 2

## 2019-12-09 MED ORDER — CEFAZOLIN SODIUM-DEXTROSE 2-4 GM/100ML-% IV SOLN
2.0000 g | Freq: Three times a day (TID) | INTRAVENOUS | Status: AC
  Administered 2019-12-09 (×2): 2 g via INTRAVENOUS
  Filled 2019-12-09 (×2): qty 100

## 2019-12-09 MED ORDER — FENTANYL CITRATE (PF) 100 MCG/2ML IJ SOLN
INTRAMUSCULAR | Status: AC
  Filled 2019-12-09: qty 2

## 2019-12-09 MED ORDER — LOSARTAN POTASSIUM 50 MG PO TABS
100.0000 mg | ORAL_TABLET | Freq: Every day | ORAL | Status: DC
  Administered 2019-12-10: 10:00:00 100 mg via ORAL
  Filled 2019-12-09: qty 2

## 2019-12-09 MED ORDER — SODIUM CHLORIDE 0.9 % IV SOLN
INTRAVENOUS | Status: DC | PRN
  Administered 2019-12-09: 09:00:00 500 mL

## 2019-12-09 MED ORDER — SCOPOLAMINE 1 MG/3DAYS TD PT72
MEDICATED_PATCH | TRANSDERMAL | Status: AC
  Administered 2019-12-09: 07:00:00 1.5 mg via TRANSDERMAL
  Filled 2019-12-09: qty 1

## 2019-12-09 MED ORDER — SODIUM CHLORIDE 0.9% FLUSH
3.0000 mL | Freq: Two times a day (BID) | INTRAVENOUS | Status: DC
  Administered 2019-12-09: 21:00:00 3 mL via INTRAVENOUS

## 2019-12-09 MED ORDER — KETOROLAC TROMETHAMINE 15 MG/ML IJ SOLN
15.0000 mg | Freq: Four times a day (QID) | INTRAMUSCULAR | Status: AC
  Administered 2019-12-09 – 2019-12-10 (×4): 15 mg via INTRAVENOUS
  Filled 2019-12-09 (×3): qty 1

## 2019-12-09 MED ORDER — FENTANYL CITRATE (PF) 100 MCG/2ML IJ SOLN
25.0000 ug | INTRAMUSCULAR | Status: DC | PRN
  Administered 2019-12-09 (×3): 50 ug via INTRAVENOUS

## 2019-12-09 MED ORDER — DEXAMETHASONE SODIUM PHOSPHATE 10 MG/ML IJ SOLN
INTRAMUSCULAR | Status: AC
  Filled 2019-12-09: qty 1

## 2019-12-09 MED ORDER — ACETAMINOPHEN 500 MG PO TABS
ORAL_TABLET | ORAL | Status: AC
  Filled 2019-12-09: qty 2

## 2019-12-09 MED ORDER — ACETAMINOPHEN 500 MG PO TABS
1000.0000 mg | ORAL_TABLET | Freq: Three times a day (TID) | ORAL | Status: DC
  Administered 2019-12-09 – 2019-12-10 (×2): 1000 mg via ORAL
  Filled 2019-12-09 (×2): qty 2

## 2019-12-09 MED ORDER — LEVOCETIRIZINE DIHYDROCHLORIDE 5 MG PO TABS: 5.0000 mg | ORAL_TABLET | Freq: Every day | ORAL | Status: DC

## 2019-12-09 MED ORDER — PROPOFOL 10 MG/ML IV BOLUS
INTRAVENOUS | Status: DC | PRN
  Administered 2019-12-09: 50 mg via INTRAVENOUS

## 2019-12-09 MED ORDER — LIDOCAINE-EPINEPHRINE 1 %-1:100000 IJ SOLN
INTRAMUSCULAR | Status: DC | PRN
  Administered 2019-12-09: 5 mL

## 2019-12-09 MED ORDER — DOXYLAMINE SUCCINATE (SLEEP) 25 MG PO TABS
25.0000 mg | ORAL_TABLET | Freq: Every day | ORAL | Status: DC
  Filled 2019-12-09: qty 1

## 2019-12-09 MED ORDER — MIDAZOLAM HCL 2 MG/2ML IJ SOLN
INTRAMUSCULAR | Status: AC
  Filled 2019-12-09: qty 2

## 2019-12-09 MED ORDER — LEVOTHYROXINE SODIUM 75 MCG PO TABS
175.0000 ug | ORAL_TABLET | Freq: Every day | ORAL | Status: DC
  Administered 2019-12-10: 06:00:00 175 ug via ORAL
  Filled 2019-12-09: qty 1

## 2019-12-09 MED ORDER — LACTINEX PO CHEW
1.0000 | CHEWABLE_TABLET | Freq: Every day | ORAL | Status: DC
  Filled 2019-12-09 (×2): qty 1

## 2019-12-09 MED ORDER — PREDNISONE 20 MG PO TABS
10.0000 mg | ORAL_TABLET | Freq: Once | ORAL | Status: AC
  Administered 2019-12-09: 07:00:00 10 mg via ORAL
  Filled 2019-12-09: qty 1

## 2019-12-09 MED ORDER — BISACODYL 10 MG RE SUPP: 10.0000 mg | Freq: Every day | RECTAL | Status: DC | PRN

## 2019-12-09 MED ORDER — PROPOFOL 10 MG/ML IV BOLUS
INTRAVENOUS | Status: AC
  Filled 2019-12-09: qty 20

## 2019-12-09 MED ORDER — PROPOFOL 500 MG/50ML IV EMUL
INTRAVENOUS | Status: DC | PRN
  Administered 2019-12-09: 50 ug/kg/min via INTRAVENOUS

## 2019-12-09 MED ORDER — HYDROXYCHLOROQUINE SULFATE 200 MG PO TABS
200.0000 mg | ORAL_TABLET | Freq: Two times a day (BID) | ORAL | Status: DC
  Administered 2019-12-09 – 2019-12-10 (×2): 200 mg via ORAL
  Filled 2019-12-09 (×2): qty 1

## 2019-12-09 MED ORDER — SUCCINYLCHOLINE CHLORIDE 200 MG/10ML IV SOSY
PREFILLED_SYRINGE | INTRAVENOUS | Status: DC | PRN
  Administered 2019-12-09: 100 mg via INTRAVENOUS

## 2019-12-09 MED ORDER — SUCCINYLCHOLINE CHLORIDE 200 MG/10ML IV SOSY
PREFILLED_SYRINGE | INTRAVENOUS | Status: AC
  Filled 2019-12-09: qty 10

## 2019-12-09 MED ORDER — PREDNISONE 10 MG PO TABS
10.0000 mg | ORAL_TABLET | Freq: Every day | ORAL | Status: DC
  Filled 2019-12-09 (×2): qty 1

## 2019-12-09 MED ORDER — FENTANYL CITRATE (PF) 250 MCG/5ML IJ SOLN
INTRAMUSCULAR | Status: AC
  Filled 2019-12-09: qty 5

## 2019-12-09 MED ORDER — LORATADINE 10 MG PO TABS
10.0000 mg | ORAL_TABLET | Freq: Every day | ORAL | Status: DC
  Administered 2019-12-09: 21:00:00 10 mg via ORAL
  Filled 2019-12-09: qty 1

## 2019-12-09 MED ORDER — NIACIN ER (ANTIHYPERLIPIDEMIC) 500 MG PO TBCR
500.0000 mg | EXTENDED_RELEASE_TABLET | Freq: Every day | ORAL | Status: DC
  Administered 2019-12-09: 21:00:00 500 mg via ORAL
  Filled 2019-12-09: qty 1

## 2019-12-09 MED ORDER — 0.9 % SODIUM CHLORIDE (POUR BTL) OPTIME
TOPICAL | Status: DC | PRN
  Administered 2019-12-09: 09:00:00 1000 mL

## 2019-12-09 MED ORDER — PHENOL 1.4 % MT LIQD: 1.0000 | OROMUCOSAL | Status: DC | PRN

## 2019-12-09 MED ORDER — MENTHOL 3 MG MT LOZG: 1.0000 | LOZENGE | OROMUCOSAL | Status: DC | PRN

## 2019-12-09 MED ORDER — LACTATED RINGERS IV SOLN: INTRAVENOUS | Status: DC | PRN

## 2019-12-09 MED ORDER — MELATONIN 10 MG PO TBDP: 10.0000 mg | ORAL_TABLET | Freq: Every day | ORAL | Status: DC

## 2019-12-09 MED ORDER — BUPIVACAINE HCL (PF) 0.5 % IJ SOLN
INTRAMUSCULAR | Status: DC | PRN
  Administered 2019-12-09: 5 mL
  Administered 2019-12-09: 20 mL

## 2019-12-09 MED ORDER — SODIUM CHLORIDE 0.9% FLUSH: 3.0000 mL | INTRAVENOUS | Status: DC | PRN

## 2019-12-09 MED ORDER — DEXAMETHASONE SODIUM PHOSPHATE 10 MG/ML IJ SOLN
INTRAMUSCULAR | Status: DC | PRN
  Administered 2019-12-09: 10 mg via INTRAVENOUS

## 2019-12-09 MED ORDER — BUPIVACAINE HCL (PF) 0.5 % IJ SOLN
INTRAMUSCULAR | Status: AC
  Filled 2019-12-09: qty 30

## 2019-12-09 MED ORDER — SENNA 8.6 MG PO TABS
1.0000 | ORAL_TABLET | Freq: Two times a day (BID) | ORAL | Status: DC
  Administered 2019-12-09 – 2019-12-10 (×3): 8.6 mg via ORAL
  Filled 2019-12-09 (×3): qty 1

## 2019-12-09 MED ORDER — POLYETHYLENE GLYCOL 3350 17 G PO PACK: 17.0000 g | PACK | Freq: Every day | ORAL | Status: DC | PRN

## 2019-12-09 MED ORDER — FENTANYL CITRATE (PF) 100 MCG/2ML IJ SOLN
INTRAMUSCULAR | Status: DC | PRN
  Administered 2019-12-09 (×2): 50 ug via INTRAVENOUS
  Administered 2019-12-09: 100 ug via INTRAVENOUS
  Administered 2019-12-09: 50 ug via INTRAVENOUS

## 2019-12-09 MED ORDER — SCOPOLAMINE 1 MG/3DAYS TD PT72: 1.0000 | MEDICATED_PATCH | Freq: Once | TRANSDERMAL | Status: DC

## 2019-12-09 MED ORDER — PROBIOTIC PO CAPS: ORAL_CAPSULE | Freq: Every day | ORAL | Status: DC

## 2019-12-09 MED ORDER — MORPHINE SULFATE (PF) 2 MG/ML IV SOLN
2.0000 mg | INTRAVENOUS | Status: DC | PRN
  Administered 2019-12-09: 12:00:00 4 mg via INTRAVENOUS
  Filled 2019-12-09: qty 2

## 2019-12-09 MED ORDER — CEFAZOLIN SODIUM-DEXTROSE 2-4 GM/100ML-% IV SOLN
INTRAVENOUS | Status: AC
  Filled 2019-12-09: qty 100

## 2019-12-09 MED ORDER — PHENYLEPHRINE HCL-NACL 10-0.9 MG/250ML-% IV SOLN
INTRAVENOUS | Status: DC | PRN
  Administered 2019-12-09: 25 ug/min via INTRAVENOUS

## 2019-12-09 MED ORDER — ACETAMINOPHEN 325 MG PO TABS: 650.0000 mg | ORAL_TABLET | ORAL | Status: DC | PRN

## 2019-12-09 MED ORDER — PROMETHAZINE HCL 25 MG/ML IJ SOLN: 6.2500 mg | INTRAMUSCULAR | Status: DC | PRN

## 2019-12-09 SURGICAL SUPPLY — 45 items
ADH SKN CLS APL DERMABOND .7 (GAUZE/BANDAGES/DRESSINGS) ×1
BLADE CLIPPER SURG (BLADE) IMPLANT
BONE MATRIX OSTEOCEL PRO LRG (Bone Implant) ×2 IMPLANT
CAGE MODULUS XL 10X18X45 - 10 (Cage) ×2 IMPLANT
COVER WAND RF STERILE (DRAPES) ×3 IMPLANT
DERMABOND ADVANCED (GAUZE/BANDAGES/DRESSINGS) ×2
DERMABOND ADVANCED .7 DNX12 (GAUZE/BANDAGES/DRESSINGS) ×1 IMPLANT
DRAPE C-ARM 42X72 X-RAY (DRAPES) ×3 IMPLANT
DRAPE C-ARMOR (DRAPES) ×3 IMPLANT
DRAPE LAPAROTOMY 100X72X124 (DRAPES) ×3 IMPLANT
DURAPREP 26ML APPLICATOR (WOUND CARE) ×3 IMPLANT
ELECT REM PT RETURN 9FT ADLT (ELECTROSURGICAL) ×3
ELECTRODE REM PT RTRN 9FT ADLT (ELECTROSURGICAL) ×1 IMPLANT
GAUZE 4X4 16PLY RFD (DISPOSABLE) IMPLANT
GLOVE BIOGEL PI IND STRL 8.5 (GLOVE) ×1 IMPLANT
GLOVE BIOGEL PI INDICATOR 8.5 (GLOVE) ×2
GLOVE ECLIPSE 8.5 STRL (GLOVE) ×1 IMPLANT
GLOVE EXAM NITRILE XL STR (GLOVE) IMPLANT
GLOVE SS N UNI LF 8.5 STRL (GLOVE) ×2 IMPLANT
GOWN STRL REUS W/ TWL LRG LVL3 (GOWN DISPOSABLE) IMPLANT
GOWN STRL REUS W/ TWL XL LVL3 (GOWN DISPOSABLE) ×1 IMPLANT
GOWN STRL REUS W/TWL 2XL LVL3 (GOWN DISPOSABLE) ×3 IMPLANT
GOWN STRL REUS W/TWL LRG LVL3 (GOWN DISPOSABLE)
GOWN STRL REUS W/TWL XL LVL3 (GOWN DISPOSABLE) ×3
HEMOSTAT POWDER KIT SURGIFOAM (HEMOSTASIS) ×2 IMPLANT
KIT BASIN OR (CUSTOM PROCEDURE TRAY) ×3 IMPLANT
KIT DILATOR XLIF 5 (KITS) ×1 IMPLANT
KIT SURGICAL ACCESS MAXCESS 4 (KITS) ×2 IMPLANT
KIT TURNOVER KIT B (KITS) ×3 IMPLANT
KIT XLIF (KITS) ×1
MODULE NVM5 NEXT GEN EMG (NEEDLE) ×2 IMPLANT
NDL HYPO 25X1 1.5 SAFETY (NEEDLE) ×1 IMPLANT
NEEDLE HYPO 25X1 1.5 SAFETY (NEEDLE) ×3 IMPLANT
NS IRRIG 1000ML POUR BTL (IV SOLUTION) ×3 IMPLANT
PACK LAMINECTOMY NEURO (CUSTOM PROCEDURE TRAY) ×3 IMPLANT
PLATE 2H 10MM (Plate) ×2 IMPLANT
SCREW DECADE 5.5X50 (Screw) ×4 IMPLANT
SPONGE LAP 4X18 RFD (DISPOSABLE) IMPLANT
SUT VIC AB 2-0 CP2 18 (SUTURE) ×3 IMPLANT
SUT VIC AB 3-0 SH 8-18 (SUTURE) ×5 IMPLANT
TAPE CLOTH 4X10 WHT NS (GAUZE/BANDAGES/DRESSINGS) ×6 IMPLANT
TOWEL GREEN STERILE (TOWEL DISPOSABLE) ×3 IMPLANT
TOWEL GREEN STERILE FF (TOWEL DISPOSABLE) ×3 IMPLANT
TRAY FOLEY MTR SLVR 16FR STAT (SET/KITS/TRAYS/PACK) ×3 IMPLANT
WATER STERILE IRR 1000ML POUR (IV SOLUTION) ×3 IMPLANT

## 2019-12-09 NOTE — Op Note (Signed)
Date of surgery: 12/09/2019 Preoperative diagnosis: Lumbar spondylosis L1-L2.  History of fusion L2-L5.  Lumbar stenosis.  Lumbar radiculopathy, neurogenic claudication. Postoperative diagnosis: Same Procedure: Anterolateral decompression L1-L2 and arthrodesis with allograft and XLIF spacer lateral plate fixation [id number] with fluoroscopic image guidance and EMG neuro monitoring. Surgeon: Kristeen Miss First Assistant: Deatra Ina, MD Anesthesia: General endotracheal Indications: Crystal Larsen is a 64 year old female who has had significant back pain and buttock pain she has had a previous decompression and fusion at L2-L5.  She now has adjacent level disease at L1-L2 and is developing a slight kyphosis.   Procedure: Patient was brought to the operating room supine on the stretcher.  After the smooth induction of general endotracheal anesthesia she was connected to EMG monitors and the major muscle groups of the lower extremities for intraoperative monitoring.  Then she was turned to the right lateral decubitus position and her orthogonal positioning on the table was checked radiographically with fluoroscopic images.  She was secured to the operating table with a series of taping maneuvers and then the lateral aspect of the skin was marked over the L1-L2 area to choose the area of entry.  Skin was prepped with alcohol DuraPrep and draped in a sterile fashion a lateral incision was created and the dissection was carried down to the rib bed.  Then a second incision was created posteriorly and inferiorly to access the retroperitoneal space.  With a finger in the retroperitoneal space and under the ribs is believed to be T10-T11 and inserted a small probe and guided this into the top of the psoas muscle at the L1-L2 junction.  The probe was secured into the interspace with a Steinmann pin.  This was all checked radiographically and EMG monitoring was performed to make sure no elements of the upper lumbar  plexus were involved.  When this was verified a series of dilators were passed over this initial dilator and then ultimately 120 mm long retractor was placed over a 15 mm dilator EMG monitoring was again performed posteriorly to assure that no elements of the upper lumbar plexus were involved and when verified a shim was placed into the interspace at L1-L2.  Positioning was all verified with fluoroscopic images obtained in AP and lateral projections.  The procedure was performed with the help of Dr. Deatra Ina who worked from the lateral aspect of the disc space to clear the medial contents and also and provided appropriate retraction when necessary.  Then the soft tissues on the lateral aspect of the space were cleared with the monopolar cautery and a bipolar cautery that the space was entered.  A series of dilators was used to open the disc space slowly and then a Cobb elevator was passed across the disc space to open the contralateral lateral ligament.  Once this was performed the endplates were decorticated using a series of rongeurs curettes and respiratory's.  Once this was verified and the interspace was sized for initially an 8 mm tall lordotic graft but it was felt that a 10 mm x 18 mm lordotic graft measuring 45 mm in width would fit best into this interval.  This was then prepared by packing it with allograft and then allograft was packed into the interspace prior to placing the spacer.  Radiographic positioning was checked and it was verified that the interspace was distracted height had been reestablished the kyphosis was reduced.  With this a lateral plate was fixed measuring 10 mm in height with 2 screws measuring  50 mm in length 1 in L2 and 1 in L1.  System was secured in a neutral construct.  With this the retractors were removed and final radiographs were obtained in AP and lateral projection hemostasis in the soft tissues was obtained meticulously and the external muscular layers were  closed loosely with 2-0 Vicryl and 3-0 Vicryl was used in a subcuticular skin on both incisions blood loss for the procedure was estimated at less than 25 cc.

## 2019-12-09 NOTE — Anesthesia Postprocedure Evaluation (Signed)
Anesthesia Post Note  Patient: Crystal Larsen  Procedure(s) Performed: Lumbar One-Two Anterolateral lumbar decompression/interbody fusion with lateral plate (N/A )     Patient location during evaluation: PACU Anesthesia Type: General Level of consciousness: awake and alert Pain management: pain level controlled Vital Signs Assessment: post-procedure vital signs reviewed and stable Respiratory status: spontaneous breathing, nonlabored ventilation and respiratory function stable Cardiovascular status: blood pressure returned to baseline and stable Postop Assessment: no apparent nausea or vomiting Anesthetic complications: no    Last Vitals:  Vitals:   12/09/19 1135 12/09/19 1158  BP: 105/69 110/63  Pulse: 78 76  Resp: 16 16  Temp:    SpO2: 100% 95%    Last Pain:  Vitals:   12/09/19 1215  TempSrc:   PainSc: [place]

## 2019-12-09 NOTE — Transfer of Care (Signed)
Immediate Anesthesia Transfer of Care Note  Patient: Crystal Larsen  Procedure(s) Performed: Lumbar One-Two Anterolateral lumbar decompression/interbody fusion with lateral plate (N/A )  Patient Location: PACU  Anesthesia Type:General  Level of Consciousness: awake, oriented and patient cooperative  Airway & Oxygen Therapy: Patient Spontanous Breathing and Patient connected to face mask oxygen  Post-op Assessment: Report given to RN and Post -op Vital signs reviewed and stable  Post vital signs: Reviewed  Last Vitals:  Vitals Value Taken Time  BP 105/67 12/09/19 1036  Temp    Pulse 81 12/09/19 1037  Resp 38 12/09/19 1037  SpO2 100 % 12/09/19 1037  Vitals shown include unvalidated device data.  Last Pain:  Vitals:   12/09/19 0619  TempSrc:   PainSc: 1          Complications: No apparent anesthesia complications

## 2019-12-09 NOTE — H&P (Signed)
Crystal Larsen is an 64 y.o. female.   Chief Complaint: Low back pain with radiation into the buttocks and hips HPI: Patient is a 64 year old individual who has had a previous decompression fusion from L2-L5.  She has developed significant low back pain centrally with radiation to the buttocks in either lower extremity.  A recent MRI demonstrates that the patient has adjacent level disease at the L1-L2 level with advanced collapse of the disc space and some moderate degrees of lateral recess stenosis there is also some subarticular stenosis that would involve primarily the path of the L2 nerve root she is failed all manner of conservative management and after careful consideration I advised surgical decompression of L1-L2 using an anterolateral technique with lateral plate fixation.  She is now admitted for that procedure.  Past Medical History:  Diagnosis Date  . Acute kidney injury ([place])   . Adrenal insufficiency ([place])   . Anemia   . Anxiety   . Arthritis    "left foot; left hand; right shoulder; back" (11/04/2014)  . Elevated liver enzymes   . Gallstones   . GERD (gastroesophageal reflux disease)   . History of gout   . HTN (hypertension)   . Hypothyroidism   . IBS (irritable bowel syndrome)    ?  [name] Lupus ([place]) 07/2019  . Metabolic acidosis   . Migraine    "maybe once/yr" (11/04/2014)  . Neuropathy    right leg  . Obesity   . Pancreatitis   . PONV (postoperative nausea and vomiting) 1981 X 1   only time  . Seizures ([place]) 2003    due to tramadol reaction -- takes depakote for migraine prevention  . Sleep apnea    does not use cpap  . Spondylolisthesis of lumbar region   . Unsteady gait   . Wears glasses     Past Surgical History:  Procedure Laterality Date  . ANKLE GANGLION CYST EXCISION Right 1980's  . BACK SURGERY    . BREAST SURGERY     biopsy  . BUNIONECTOMY Left 2012  . CARPAL TUNNEL RELEASE Right 05/22/2019   Procedure: RIGHT CARPAL TUNNEL RELEASE;  Surgeon:  Daryll Brod, MD;  Location: [facility];  Service: Orthopedics;  Laterality: Right;  . CARPOMETACARPEL SUSPENSION PLASTY Right 05/22/2019   Procedure: ARTHROPLASY CARPOMETACARPAL RIGHT THUMB TRAPEZIUM EXCISION TENDON TRANSFER MICRO LINK SUSPENSION;  Surgeon: Daryll Brod, MD;  Location: [facility];  Service: Orthopedics;  Laterality: Right;  . CESAREAN SECTION  1986  . COLONOSCOPY W/ BIOPSIES AND POLYPECTOMY    . EYE SURGERY Bilateral 2012   "lasered a hole in my iris cause pressure was building up"  . LAPAROSCOPIC CHOLECYSTECTOMY  1990's  . NASAL SINUS SURGERY  1980's  . POSTERIOR LUMBAR FUSION  05/2013   L4-5; S1  . POSTERIOR LUMBAR FUSION  11/04/2014   L3-4  . SHOULDER ARTHROSCOPY W/ ROTATOR CUFF REPAIR Right 2012  . TUBAL LIGATION  1992    Family History  Problem Relation Age of Onset  . Asthma Mother   . Lung cancer Mother   . Heart disease Father   . Heart disease Maternal Grandfather        PGF  . Alcohol abuse Maternal Grandfather   . Hyperlipidemia Maternal Grandfather   . Anxiety disorder Sister   . Fibromyalgia Sister   . Nephrolithiasis Daughter    Social History:  reports that she has never smoked. She has never used smokeless tobacco. She reports current alcohol  use of about 1.0 standard drinks of alcohol per week. She reports that she does not use drugs.  Allergies:  Allergies  Allergen Reactions  . Ultram [Tramadol] Other (See Comments)    seizures  . Adhesive [Tape] Rash    bandaide  . Erythromycin Rash  . Latex Rash  . Neurontin [Gabapentin] Itching  . Sulfonamide Derivatives Rash    Medications Prior to Admission  Medication Sig Dispense Refill  . acetaminophen (TYLENOL) 500 MG tablet Take 1,000 mg by mouth 3 (three) times daily.     [name] amLODipine (NORVASC) 10 MG tablet Take 10 mg by mouth daily.    [name] BIOTIN PO Take 1 mL by mouth daily.    . Cholecalciferol (VITAMIN D-3 PO) Take 0.25 mLs by mouth daily. High Potency Vitamin  D3 Liquid    . COLLAGEN PO Take 20 g by mouth daily. Vital Proteins Collagen Peptides    . desvenlafaxine (PRISTIQ) 50 MG 24 hr tablet TAKE 1 TABLET BY MOUTH  DAILY (Patient taking differently: Take 50 mg by mouth daily. ) 90 tablet 3  . divalproex (DEPAKOTE ER) 250 MG 24 hr tablet TAKE 1 TABLET BY MOUTH  DAILY (Patient taking differently: Take 250 mg by mouth daily. ) 90 tablet 3  . doxylamine, Sleep, (UNISOM) 25 MG tablet Take 25 mg by mouth at bedtime.    . hydroxychloroquine (PLAQUENIL) 200 MG tablet Take 200 mg by mouth 2 (two) times daily.    [name] levocetirizine (XYZAL) 5 MG tablet Take 5 mg by mouth at bedtime.    [name] levothyroxine (SYNTHROID) 175 MCG tablet Take 1 tablet (175 mcg total) by mouth daily before breakfast. 30 tablet 1  . losartan (COZAAR) 100 MG tablet Take 100 mg by mouth daily.    . Melatonin 10 MG TBDP Take 10 mg by mouth at bedtime.    . niacin (NIASPAN) 500 MG CR tablet Take 500 mg by mouth at bedtime.    . Omega-3 Fatty Acids (FISH OIL) 1000 MG CAPS Take 1,000 mg by mouth at bedtime.     [name] OVER THE COUNTER MEDICATION Take 2 mLs by mouth daily.    . predniSONE (DELTASONE) 5 MG tablet Take 10 mg by mouth daily at 12 noon.    . Probiotic Product (PROBIOTIC PO) Take 1 capsule by mouth daily.     [name] aspirin EC 81 MG tablet Take 81 mg by mouth at bedtime.    . hydrocortisone (CORTEF) 10 MG tablet Take 25mg  (2 1/2 tab) twice daily for 2 days then take 20mg (2 tabs)twice a day for 3 days then take 10mg (1 tab)in the morning and 20mg (2 tabs at night for 30 days. (Patient not taking: Reported on 12/01/2019) 112 tablet 1    No results found for this or any previous visit (from the past 48 hour(s)). No results found.  Review of Systems  Constitutional: Negative.   HENT: Negative.   Eyes: Negative.   Respiratory: Negative.   Cardiovascular: Negative.   Gastrointestinal: Negative.   Endocrine: Negative.   Genitourinary: Negative.   Musculoskeletal: Positive for back pain.   Allergic/Immunologic: Negative.   Neurological: Positive for weakness and numbness.  Hematological: Negative.   Psychiatric/Behavioral: Negative.     Blood pressure (!) 158/88, pulse 73, temperature (!) 97.5 F (36.4 C), temperature source Oral, height 5\' 7"  (1.702 m), weight 70.8 kg, SpO2 100 %. Physical Exam  Constitutional: She is oriented to person, place, and time. She appears well-developed and well-nourished.  HENT:  Head: Normocephalic  and atraumatic.  Eyes: Pupils are equal, round, and reactive to light. Conjunctivae and EOM are normal.  Cardiovascular: Normal rate and regular rhythm.  Respiratory: Effort normal and breath sounds normal.  GI: Soft. Bowel sounds are normal.  Musculoskeletal:     Cervical back: Normal range of motion and neck supple.     Comments: See below  Neurological: She is alert and oriented to person, place, and time.  Mild gluteal weakness bilaterally.  Positive straight leg raising at 30 degrees in either lower extremities Patrick's maneuver is negative bilaterally.  Cranial nerve examination is within limits of normal.  Upper extremity strength is normal.  Station and gait are intact  Skin: Skin is warm and dry.  Psychiatric: She has a normal mood and affect. Her behavior is normal. Judgment and thought content normal.     Assessment/Plan Spondylosis and mild stenosis of L1-L2 status post L2-L5 decompression and fusion.  (Adjacent level disease) Plan: Anterolateral decompression and arthrodesis using X-LIF technique and lateral plate fixation.  Earleen Newport, MD 12/09/2019, 7:49 AM

## 2019-12-09 NOTE — Anesthesia Procedure Notes (Signed)
Procedure Name: Intubation Date/Time: 12/09/2019 8:13 AM Performed by: Jenne Campus, CRNA Pre-anesthesia Checklist: Patient identified, Emergency Drugs available, Suction available and Patient being monitored Patient Re-evaluated:Patient Re-evaluated prior to induction Oxygen Delivery Method: Circle System Utilized Preoxygenation: Pre-oxygenation with 100% oxygen Induction Type: IV induction Ventilation: Mask ventilation without difficulty Laryngoscope Size: Glidescope and 3 Grade View: Grade I Tube type: Oral Tube size: 7.0 mm Number of attempts: 1 Airway Equipment and Method: Stylet and Video-laryngoscopy Placement Confirmation: ETT inserted through vocal cords under direct vision,  positive ETCO2 and breath sounds checked- equal and bilateral Secured at: 22 cm Tube secured with: Tape Dental Injury: Teeth and Oropharynx as per pre-operative assessment  Difficulty Due To: Difficulty was anticipated Future Recommendations: Recommend- induction with short-acting agent, and alternative techniques readily available Comments: Elective video-glide. Previous intubations with glidescope for anterior airway and small airway anatomy. Previous intubation multiple attempts and bougie stylet used. Smooth Iv induction. Easy mask. DL x 1 glidescope 3. Grade 1 view. Atraumatic intubation. BBSE. +ETCO2.

## 2019-12-10 ENCOUNTER — Encounter: Payer: Self-pay | Admitting: *Deleted

## 2019-12-10 MED ORDER — OXYCODONE-ACETAMINOPHEN 5-325 MG PO TABS: 1.0000 | ORAL_TABLET | ORAL | 0 refills | Status: DC | PRN

## 2019-12-10 NOTE — Evaluation (Signed)
Physical Therapy Evaluation Patient Details Name: Crystal Larsen MRN: [id number] DOB: 20-Nov-1956 Today's Date: 12/10/2019   History of Present Illness  Pt is a 64 y/o female now s/p anterolateral decompression L1-L2 with interbody arthrodesis and allograft placement. PMHx includes anxiety, HTN, anemia, neuropathy, seizures, hx of multiple back surgeries  Clinical Impression  Patient evaluated by Physical Therapy with no further acute PT needs identified. All education has been completed and the patient has no further questions. Pt was able to demonstrate transfers and ambulation with gross modified independence and SPC for balance support. Pt was educated on precautions, brace application/wearing schedule, appropriate activity progression, and car transfer. She anticipates d/c home this morning. See below for any follow-up Physical Therapy or equipment needs. PT is signing off. Thank you for this referral.     Follow Up Recommendations No PT follow up;Supervision - Intermittent    Equipment Recommendations  None recommended by PT    Recommendations for Other Services       Precautions / Restrictions Precautions Precautions: Fall;Back Precaution Booklet Issued: Yes (comment) Precaution Comments: issued and reviewed with pt Required Braces or Orthoses: Spinal Brace Spinal Brace: Lumbar corset;Applied in sitting position Restrictions Weight Bearing Restrictions: No      Mobility  Bed Mobility               General bed mobility comments: Pt was received sitting up on EOB when PT arrived. Verbally reviewed log roll  Transfers Overall transfer level: Modified independent Equipment used: Straight cane                Ambulation/Gait Ambulation/Gait assistance: Modified independent (Device/Increase time) Gait Distance (Feet): 400 Feet Assistive device: Straight cane Gait Pattern/deviations: Step-through pattern;Decreased stride length Gait velocity: Decreased Gait  velocity interpretation: 1.31 - 2.62 ft/sec, indicative of limited community ambulator General Gait Details: Overall pt ambulating well with SPC for support. Occasionally got off sequence with cane but was able to recover without assistance.   Stairs Stairs: Yes Stairs assistance: Min guard;Supervision Stair Management: Step to pattern;Forwards;One rail Left;With cane Number of Stairs: 3 General stair comments: VC's for sequencing and general safety. Progressed to close supervision for safety. Was able to negotiate stairs with [facility] and railing - although will not have the rail at home will have husband for HHA and balance support if needed.   Wheelchair Mobility    Modified Rankin (Stroke Patients Only)       Balance Overall balance assessment: No apparent balance deficits (not formally assessed)                                           Pertinent Vitals/Pain Pain Assessment: No/denies pain    Home Living Family/patient expects to be discharged to:: Private residence Living Arrangements: Spouse/significant other Available Help at Discharge: Family Type of Home: House Home Access: Stairs to enter Entrance Stairs-Rails: None Entrance Stairs-Number of Steps: 3 Home Layout: One level Home Equipment: Cane - single point;Bedside commode;Walker - 2 wheels      Prior Function Level of Independence: Independent               Hand Dominance   Dominant Hand: Right    Extremity/Trunk Assessment   Upper Extremity Assessment Upper Extremity Assessment: Defer to OT evaluation    Lower Extremity Assessment Lower Extremity Assessment: Overall WFL for tasks assessed    Cervical / Trunk  Assessment Cervical / Trunk Assessment: Other exceptions Cervical / Trunk Exceptions: s/p lumbar sx  Communication   Communication: No difficulties  Cognition Arousal/Alertness: Awake/alert Behavior During Therapy: WFL for tasks assessed/performed Overall Cognitive  Status: Within Functional Limits for tasks assessed                                        General Comments      Exercises     Assessment/Plan    PT Assessment Patent does not need any further PT services  PT Problem List         PT Treatment Interventions      PT Goals (Current goals can be found in the Care Plan section)  Acute Rehab PT Goals Patient Stated Goal: home today PT Goal Formulation: All assessment and education complete, DC therapy    Frequency     Barriers to discharge        Co-evaluation               AM-PAC PT "6 Clicks" Mobility  Outcome Measure Help needed turning from your back to your side while in a flat bed without using bedrails?: None Help needed moving from lying on your back to sitting on the side of a flat bed without using bedrails?: None Help needed moving to and from a bed to a chair (including a wheelchair)?: None Help needed standing up from a chair using your arms (e.g., wheelchair or bedside chair)?: None Help needed to walk in hospital room?: None Help needed climbing 3-5 steps with a railing? : None 6 Click Score: 24    End of Session Equipment Utilized During Treatment: Gait belt Activity Tolerance: Patient tolerated treatment well Patient left: in chair;with call bell/phone within reach Nurse Communication: Mobility status PT Visit Diagnosis: Unsteadiness on feet (R26.81)    Time: [id number] PT Time Calculation (min) (ACUTE ONLY): 17 min   Charges:   PT Evaluation $PT Eval Low Complexity: 1 Low          Rolinda Roan, PT, DPT Acute Rehabilitation Services Pager: [phone] Office: [phone]   Thelma Comp 12/10/2019, 12:54 PM

## 2019-12-10 NOTE — Discharge Summary (Signed)
Physician Discharge Summary  Patient ID: Crystal Larsen MRN: [id number] DOB/AGE: [birth year] 65 y.o.  Admit date: 12/09/2019 Discharge date: 12/10/2019  Admission Diagnoses: Lumbar spondylosis and stenosis L1-L2.  History of fusion L2-L5.  Lumbar radiculopathy, neurogenic claudication.  Discharge Diagnoses: Lumbar spondylosis and stenosis L1-L2.  History of fusion L2-L5.  Lumbar radiculopathy, neurogenic claudication. Active Problems:   Other spondylosis with radiculopathy, lumbar region   Discharged Condition: good  Hospital Course: Patient was admitted to undergo an anterolateral decompression and fusion at L1-L2.  She tolerated surgery well  Consults: None  Significant Diagnostic Studies: None  Treatments: surgery: Anterolateral decompression L1-L2 with interbody arthrodesis and allograft placement.  Discharge Exam: Blood pressure (!) 132/96, pulse 92, temperature 98.4 F (36.9 C), temperature source Oral, resp. rate 18, height 5\' 7"  (1.702 m), weight 70.8 kg, SpO2 100 %. Incision is clean and dry.  Station and gait are intact  Disposition: Discharge disposition: 01-Home or Self Care       Discharge Instructions    Call MD for:  redness, tenderness, or signs of infection (pain, swelling, redness, odor or green/yellow discharge around incision site)   Complete by: As directed    Call MD for:  severe uncontrolled pain   Complete by: As directed    Call MD for:  temperature >100.4   Complete by: As directed    Diet - low sodium heart healthy   Complete by: As directed    Discharge instructions   Complete by: As directed    Okay to shower. Do not apply salves or appointments to incision. No heavy lifting with the upper extremities greater than 15 pounds. May resume driving when not requiring pain medication and patient feels comfortable with doing so.   Incentive spirometry RT   Complete by: As directed    Increase activity slowly   Complete by: As directed       Allergies as of 12/10/2019      Reactions   Ultram [tramadol] Other (See Comments)   seizures   Adhesive [tape] Rash   bandaide   Erythromycin Rash   Latex Rash   Neurontin [gabapentin] Itching   Sulfonamide Derivatives Rash      Medication List    TAKE these medications   acetaminophen 500 MG tablet Commonly known as: TYLENOL Take 1,000 mg by mouth 3 (three) times daily.   amLODipine 10 MG tablet Commonly known as: NORVASC Take 10 mg by mouth daily.   aspirin EC 81 MG tablet Take 81 mg by mouth at bedtime.   BIOTIN PO Take 1 mL by mouth daily.   COLLAGEN PO Take 20 g by mouth daily. Vital Proteins Collagen Peptides   desvenlafaxine 50 MG 24 hr tablet Commonly known as: PRISTIQ TAKE 1 TABLET BY MOUTH  DAILY   divalproex 250 MG 24 hr tablet Commonly known as: DEPAKOTE ER TAKE 1 TABLET BY MOUTH  DAILY   doxylamine (Sleep) 25 MG tablet Commonly known as: UNISOM Take 25 mg by mouth at bedtime.   Fish Oil 1000 MG Caps Take 1,000 mg by mouth at bedtime.   hydrocortisone 10 MG tablet Commonly known as: CORTEF Take 25mg  (2 1/2 tab) twice daily for 2 days then take 20mg (2 tabs)twice a day for 3 days then take 10mg (1 tab)in the morning and 20mg (2 tabs at night for 30 days.   hydroxychloroquine 200 MG tablet Commonly known as: PLAQUENIL Take 200 mg by mouth 2 (two) times daily.   levocetirizine 5 MG tablet Commonly known as:  XYZAL Take 5 mg by mouth at bedtime.   levothyroxine 175 MCG tablet Commonly known as: SYNTHROID Take 1 tablet (175 mcg total) by mouth daily before breakfast.   losartan 100 MG tablet Commonly known as: COZAAR Take 100 mg by mouth daily.   Melatonin 10 MG Tbdp Take 10 mg by mouth at bedtime.   niacin 500 MG CR tablet Commonly known as: NIASPAN Take 500 mg by mouth at bedtime.   OVER THE COUNTER MEDICATION Take 2 mLs by mouth daily.   oxyCODONE-acetaminophen 5-325 MG tablet Commonly known as: PERCOCET/ROXICET Take 1-2 tablets  by mouth every 3 (three) hours as needed for moderate pain or severe pain.   predniSONE 5 MG tablet Commonly known as: DELTASONE Take 10 mg by mouth daily at 12 noon.   PROBIOTIC PO Take 1 capsule by mouth daily.   VITAMIN D-3 PO Take 0.25 mLs by mouth daily. High Potency Vitamin D3 Liquid        Signed: Earleen Newport 12/10/2019, 8:55 AM

## 2019-12-10 NOTE — Progress Notes (Signed)
Pt doing well. Pt given D/C instructions with verbal understanding. Rx were sent to pharmacy by MD. Pt's incisions are clean and dry with no sign of infection. Pt's IV was removed prior to D/C. Pt D/C'd home via wheelchair per MD order. Pt is stable @ D/C and has no other needs at this time. Holli Humbles, RN

## 2019-12-10 NOTE — Discharge Instructions (Signed)

## 2019-12-10 NOTE — Evaluation (Addendum)
Occupational Therapy Evaluation and Discharge Patient Details Name: Crystal Larsen MRN: [id number] DOB: Nov 06, 1956 Today's Date: 12/10/2019    History of Present Illness Pt is a 64 y/o female now s/p anterolateral decompression L1-L2 with interbody arthrodesis and allograft placement. PMHx includes anxiety, HTN, anemia, neuropathy, seizures, hx of multiple back surgeries   Clinical Impression   This 64 y/o female presents with the above. PTA pt reports independence with ADL and functional mobility. Pt demonstrating room level functional mobility without AD (has SPC in room for hallway level mobility), demonstrating UB and LB ADL during session overall at supervision level. Educated/reviewed with pt back precautions, brace management, safety and compensatory techniques for completing ADL and functional transfers while maintaining precautions with pt verbalizing understanding. Pt requiring min cues for adhering to precautions during session but overall with good carryover. She reports plans to return home with spouse assist at time of discharge. Questions answered throughout session with no further acute OT needs identified. Feel pt is safe to return home once medically ready given available spouse assist. Acute OT to sign off, thank you for this referral.     Follow Up Recommendations  No OT follow up;Supervision - Intermittent    Equipment Recommendations  None recommended by OT(pt's DME needs are met)           Precautions / Restrictions Precautions Precautions: Fall;Back Precaution Booklet Issued: Yes (comment) Precaution Comments: issued and reviewed with pt Required Braces or Orthoses: Spinal Brace Spinal Brace: Lumbar corset;Applied in sitting position Restrictions Weight Bearing Restrictions: No      Mobility Bed Mobility               General bed mobility comments: verbally reviewed log roll technique, educated to use technique even if pain levels are minimal    Transfers Overall transfer level: Modified independent Equipment used: None                  Balance Overall balance assessment: No apparent balance deficits (not formally assessed)(pt up ad lib in room, has SPC for longer distances )                                         ADL either performed or assessed with clinical judgement   ADL Overall ADL's : Needs assistance/impaired Eating/Feeding: Independent;Sitting   Grooming: Supervision/safety;Standing   Upper Body Bathing: Modified independent;Sitting   Lower Body Bathing: Supervison/ safety;Sit to/from stand   Upper Body Dressing : Modified independent;Sitting Upper Body Dressing Details (indicate cue type and reason): cues to don brace in sitting vs standing Lower Body Dressing: Supervision/safety;Sit to/from stand Lower Body Dressing Details (indicate cue type and reason): pt easily able to perform figure 4 for sock management Toilet Transfer: Supervision/safety;Ambulation   Toileting- Clothing Manipulation and Hygiene: Supervision/safety;Sit to/from Nurse, children's Details (indicate cue type and reason): reviewed use of 3:1 as shower seat for increased safety and ease of adhering to back precautions, pt verbalizing understanding Functional mobility during ADLs: Supervision/safety                           Pertinent Vitals/Pain Pain Assessment: No/denies pain     Hand Dominance     Extremity/Trunk Assessment Upper Extremity Assessment Upper Extremity Assessment: Overall WFL for tasks assessed   Lower Extremity Assessment Lower Extremity Assessment: Defer to  PT evaluation   Cervical / Trunk Assessment Cervical / Trunk Assessment: Other exceptions Cervical / Trunk Exceptions: s/p lumbar sx   Communication Communication Communication: No difficulties   Cognition Arousal/Alertness: Awake/alert Behavior During Therapy: WFL for tasks assessed/performed Overall  Cognitive Status: Within Functional Limits for tasks assessed                                     General Comments       Exercises     Shoulder Instructions      Home Living Family/patient expects to be discharged to:: Private residence Living Arrangements: Spouse/significant other Available Help at Discharge: Family Type of Home: House Home Access: Stairs to enter Technical brewer of Steps: 2 Entrance Stairs-Rails: None Home Layout: One level     Bathroom Shower/Tub: Tub/shower unit;Walk-in shower   Bathroom Toilet: Standard     Home Equipment: Cane - single point;Bedside commode          Prior Functioning/Environment Level of Independence: Independent                 OT Problem List: Decreased range of motion;Decreased knowledge of use of DME or AE;Decreased knowledge of precautions;Decreased activity tolerance      OT Treatment/Interventions:      OT Goals(Current goals can be found in the care plan section) Acute Rehab OT Goals Patient Stated Goal: home today OT Goal Formulation: All assessment and education complete, DC therapy  OT Frequency:     Barriers to D/C:            Co-evaluation              AM-PAC OT "6 Clicks" Daily Activity     Outcome Measure Help from another person eating meals?: None Help from another person taking care of personal grooming?: None Help from another person toileting, which includes using toliet, bedpan, or urinal?: None Help from another person bathing (including washing, rinsing, drying)?: A Little Help from another person to put on and taking off regular upper body clothing?: None Help from another person to put on and taking off regular lower body clothing?: None 6 Click Score: 23   End of Session Equipment Utilized During Treatment: Back brace Nurse Communication: Mobility status  Activity Tolerance: Patient tolerated treatment well Patient left: in chair;with call bell/phone  within reach  OT Visit Diagnosis: Other abnormalities of gait and mobility (R26.89)                Time: 4136-4383 OT Time Calculation (min): 19 min Charges:  OT General Charges $OT Visit: 1 Visit OT Evaluation $OT Eval Low Complexity: 1 Low  Lou Cal, OT [facility] Pager [phone] Office [phone]  Raymondo Band 12/10/2019, 9:11 AM

## 2019-12-17 ENCOUNTER — Emergency Department (HOSPITAL_BASED_OUTPATIENT_CLINIC_OR_DEPARTMENT_OTHER)
Admission: EM | Admit: 2019-12-17 | Discharge: 2019-12-17 | Disposition: A | Discharge status: Home / Self Care | Payer: 59 | Attending: Emergency Medicine | Admitting: Emergency Medicine

## 2019-12-17 ENCOUNTER — Other Ambulatory Visit: Payer: Self-pay | Admitting: Neurology

## 2019-12-17 ENCOUNTER — Emergency Department (HOSPITAL_BASED_OUTPATIENT_CLINIC_OR_DEPARTMENT_OTHER): Payer: 59

## 2019-12-17 ENCOUNTER — Other Ambulatory Visit: Payer: Self-pay

## 2019-12-17 ENCOUNTER — Encounter (HOSPITAL_BASED_OUTPATIENT_CLINIC_OR_DEPARTMENT_OTHER): Payer: Self-pay | Admitting: *Deleted

## 2019-12-17 DIAGNOSIS — Z79899 Other long term (current) drug therapy: Secondary | ICD-10-CM | POA: Diagnosis not present

## 2019-12-17 DIAGNOSIS — M3210 Systemic lupus erythematosus, organ or system involvement unspecified: Secondary | ICD-10-CM | POA: Insufficient documentation

## 2019-12-17 DIAGNOSIS — Z7902 Long term (current) use of antithrombotics/antiplatelets: Secondary | ICD-10-CM | POA: Insufficient documentation

## 2019-12-17 DIAGNOSIS — E039 Hypothyroidism, unspecified: Secondary | ICD-10-CM | POA: Diagnosis not present

## 2019-12-17 DIAGNOSIS — I10 Essential (primary) hypertension: Secondary | ICD-10-CM | POA: Diagnosis not present

## 2019-12-17 DIAGNOSIS — R110 Nausea: Secondary | ICD-10-CM | POA: Diagnosis not present

## 2019-12-17 DIAGNOSIS — Z882 Allergy status to sulfonamides status: Secondary | ICD-10-CM | POA: Insufficient documentation

## 2019-12-17 DIAGNOSIS — R1013 Epigastric pain: Secondary | ICD-10-CM | POA: Diagnosis not present

## 2019-12-17 DIAGNOSIS — Z886 Allergy status to analgesic agent status: Secondary | ICD-10-CM | POA: Diagnosis not present

## 2019-12-17 DIAGNOSIS — Z7982 Long term (current) use of aspirin: Secondary | ICD-10-CM | POA: Insufficient documentation

## 2019-12-17 DIAGNOSIS — Z885 Allergy status to narcotic agent status: Secondary | ICD-10-CM | POA: Insufficient documentation

## 2019-12-17 DIAGNOSIS — Z91040 Latex allergy status: Secondary | ICD-10-CM | POA: Insufficient documentation

## 2019-12-17 DIAGNOSIS — R5383 Other fatigue: Secondary | ICD-10-CM | POA: Diagnosis not present

## 2019-12-17 LAB — URINALYSIS, MICROSCOPIC (REFLEX): WBC, UA: NONE SEEN WBC/hpf (ref 0–5)

## 2019-12-17 LAB — COMPREHENSIVE METABOLIC PANEL
ALT: 60 U/L — ABNORMAL HIGH (ref 0–44)
AST: 62 U/L — ABNORMAL HIGH (ref 15–41)
Albumin: 3.5 g/dL (ref 3.5–5.0)
Alkaline Phosphatase: 84 U/L (ref 38–126)
Anion gap: 7 (ref 5–15)
BUN: 22 mg/dL (ref 8–23)
CO2: 25 mmol/L (ref 22–32)
Calcium: 9.6 mg/dL (ref 8.9–10.3)
Chloride: 102 mmol/L (ref 98–111)
Creatinine, Ser: 1.05 mg/dL — ABNORMAL HIGH (ref 0.44–1.00)
GFR calc Af Amer: >60 mL/min (ref >60)
GFR calc non Af Amer: 56 mL/min — ABNORMAL LOW (ref >60)
Glucose, Bld: 100 mg/dL — ABNORMAL HIGH (ref 70–99)
Potassium: 4 mmol/L (ref 3.5–5.1)
Sodium: 134 mmol/L — ABNORMAL LOW (ref 135–145)
Total Bilirubin: 0.5 mg/dL (ref 0.3–1.2)
Total Protein: 7.1 g/dL (ref 6.5–8.1)

## 2019-12-17 LAB — CBC WITH DIFFERENTIAL/PLATELET
Abs Immature Granulocytes: 0.1 10*3/uL — ABNORMAL HIGH (ref 0.00–0.07)
Basophils Absolute: 0 10*3/uL (ref 0.0–0.1)
Basophils Relative: 0 %
Eosinophils Absolute: 0 10*3/uL (ref 0.0–0.5)
Eosinophils Relative: 0 %
HCT: 41.7 % (ref 36.0–46.0)
Hemoglobin: 13 g/dL (ref 12.0–15.0)
Immature Granulocytes: 1 %
Lymphocytes Relative: 19 %
Lymphs Abs: 1.4 10*3/uL (ref 0.7–4.0)
MCH: 28.4 pg (ref 26.0–34.0)
MCHC: 31.2 g/dL (ref 30.0–36.0)
MCV: 91 fL (ref 80.0–100.0)
Monocytes Absolute: 0.3 10*3/uL (ref 0.1–1.0)
Monocytes Relative: 4 %
Neutro Abs: 5.5 10*3/uL (ref 1.7–7.7)
Neutrophils Relative %: 76 %
Platelets: 374 10*3/uL (ref 150–400)
RBC: 4.58 MIL/uL (ref 3.87–5.11)
RDW: 14 % (ref 11.5–15.5)
WBC: 7.3 10*3/uL (ref 4.0–10.5)
nRBC: 0 % (ref 0.0–0.2)

## 2019-12-17 LAB — URINALYSIS, ROUTINE W REFLEX MICROSCOPIC
Bilirubin Urine: NEGATIVE
Glucose, UA: NEGATIVE mg/dL
Ketones, ur: NEGATIVE mg/dL
Leukocytes,Ua: NEGATIVE
Nitrite: NEGATIVE
Protein, ur: NEGATIVE mg/dL
Specific Gravity, Urine: >1.03 — ABNORMAL HIGH (ref 1.005–1.030)
pH: 5.5 (ref 5.0–8.0)

## 2019-12-17 LAB — TROPONIN I (HIGH SENSITIVITY)
Troponin I (High Sensitivity): 5 ng/L (ref <18)
Troponin I (High Sensitivity): 5 ng/L (ref <18)

## 2019-12-17 LAB — LIPASE, BLOOD: Lipase: 51 U/L (ref 11–51)

## 2019-12-17 MED ORDER — FENTANYL CITRATE (PF) 100 MCG/2ML IJ SOLN
50.0000 ug | Freq: Once | INTRAMUSCULAR | Status: AC
  Administered 2019-12-17: 50 ug via INTRAVENOUS
  Filled 2019-12-17: qty 2

## 2019-12-17 MED ORDER — IOHEXOL 300 MG/ML  SOLN
100.0000 mL | Freq: Once | INTRAMUSCULAR | Status: AC | PRN
  Administered 2019-12-17: 100 mL via INTRAVENOUS

## 2019-12-17 MED ORDER — OMEPRAZOLE 20 MG PO CPDR: 20.0000 mg | DELAYED_RELEASE_CAPSULE | Freq: Every day | ORAL | 0 refills | Status: DC

## 2019-12-17 MED ORDER — ONDANSETRON 4 MG PO TBDP: 4.0000 mg | ORAL_TABLET | Freq: Three times a day (TID) | ORAL | 0 refills | Status: DC | PRN

## 2019-12-17 MED ORDER — ONDANSETRON HCL 4 MG/2ML IJ SOLN
4.0000 mg | Freq: Once | INTRAMUSCULAR | Status: AC
  Administered 2019-12-17: 4 mg via INTRAVENOUS
  Filled 2019-12-17: qty 2

## 2019-12-17 MED ORDER — SODIUM CHLORIDE 0.9 % IV BOLUS
1000.0000 mL | Freq: Once | INTRAVENOUS | Status: AC
  Administered 2019-12-17: 1000 mL via INTRAVENOUS

## 2019-12-17 NOTE — ED Notes (Signed)
Patient verbalizes understanding of discharge instructions. Opportunity for questioning and answers were provided. Armband removed by staff, pt discharged from ED.  

## 2019-12-17 NOTE — ED Notes (Signed)
Presents with abd pain at all 4 quads, having loose stools. Denies any chest pain, SOB. Placed on cont cardiac monitor with cont POX and int NBP. States has had pancreatitis before and this pain feels similar.

## 2019-12-17 NOTE — Discharge Instructions (Addendum)
Your testing is reassuring.  Take the stomach medication as prescribed.  Avoid alcohol, caffeine, NSAID medication, spicy foods.  Follow-up with your back surgeon as well as the stomach doctor.  Return to the ED if you develop new or worsening symptoms.

## 2019-12-17 NOTE — ED Triage Notes (Signed)
Pt c/o abd pain x 3 days  Hx pancreatitis

## 2019-12-17 NOTE — ED Provider Notes (Signed)
[facility] EMERGENCY DEPARTMENT Provider Note   CSN: [id number] Arrival date & time: 12/17/19  1444     History Chief Complaint  Patient presents with  . Abdominal Pain    Crystal Larsen is a 64 y.o. female.  Patient presents with a 3-day history of epigastric pain that spreads diffusely into her entire abdomen.  She states the pain is worse with palpation and eating.  She has been taking hydrocodone at home for partial relief.  She has had nausea but no vomiting.  No fever.  States she has been constipated had diarrhea following this morning which is normal for her.  Her last bowel movement was a week previous to this.  Stools have not been dark or bloody.  She notably had lumbar spine fusion surgery by Dr. Ellene Route on January 12.  She states her back pain is well controlled.  She has no new numbness, weakness, tingling.  No fever.  No bowel or bladder incontinence.  No chest pain or shortness of breath.  Previous cholecystectomy.  States her pancreatitis was thought to be due to alcohol abuse in the past.  States she drinks wine 2 or 3 times a week now with her last drink about 5 days ago. She does take chronic prednisone for history of questionable adrenal insufficiency.  The history is provided by the patient.  Abdominal Pain Associated symptoms: fatigue and nausea   Associated symptoms: no chest pain, no cough, no dysuria, no fever, no hematuria, no shortness of breath and no vomiting        Past Medical History:  Diagnosis Date  . Acute kidney injury ([place])   . Adrenal insufficiency ([place])   . Anemia   . Anxiety   . Arthritis    "left foot; left hand; right shoulder; back" (11/04/2014)  . Elevated liver enzymes   . Gallstones   . GERD (gastroesophageal reflux disease)   . History of gout   . HTN (hypertension)   . Hypothyroidism   . IBS (irritable bowel syndrome)    ?  [name] Lupus ([place]) 07/2019  . Metabolic acidosis   . Migraine    "maybe once/yr" (11/04/2014)   . Neuropathy    right leg  . Obesity   . Pancreatitis   . Pancreatitis   . PONV (postoperative nausea and vomiting) 1981 X 1   only time  . Seizures ([place]) 2003    due to tramadol reaction -- takes depakote for migraine prevention  . Sleep apnea    does not use cpap  . Spondylolisthesis of lumbar region   . Unsteady gait   . Wears glasses     Patient Active Problem List   Diagnosis Date Noted  . Other spondylosis with radiculopathy, lumbar region 12/09/2019  . Secondary adrenal insufficiency ([place]) 04/03/2019  . Metabolic acidosis, normal anion gap (NAG) 04/02/2019  . HTN (hypertension)   . Adrenal insufficiency ([place]) 04/01/2019  . Hyponatremia 04/01/2019  . Hypothyroidism 04/01/2019  . AKI (acute kidney injury) ([place]) 04/01/2019  . Lumbar stenosis with neurogenic claudication 02/19/2017  . Migraine with aura and without status migrainosus, not intractable 07/11/2016  . Spondylolisthesis of lumbar region 11/04/2014  . Postconcussional syndrome 09/30/2013  . Post-concussion headache 09/30/2013  . Esophageal reflux 10/11/2011  . Benign neoplasm of colon 10/11/2011  . Gastritis, chronic 10/11/2011  . Guaiac + stool 09/28/2011  . IBS (irritable bowel syndrome) 09/28/2011  . GERD (gastroesophageal reflux disease) 09/28/2011  . Migraine syndrome 09/28/2011  Past Surgical History:  Procedure Laterality Date  . ANKLE GANGLION CYST EXCISION Right 1980's  . ANTERIOR LAT LUMBAR FUSION N/A 12/09/2019   Procedure: Lumbar One-Two Anterolateral lumbar decompression/interbody fusion with lateral plate;  Surgeon: Kristeen Miss, MD;  Location: [facility];  Service: Neurosurgery;  Laterality: N/A;  Lumbar One-Two Anterolateral lumbar decompression/interbody fusion with lateral plate  . BACK SURGERY    . BREAST SURGERY     biopsy  . BUNIONECTOMY Left 2012  . CARPAL TUNNEL RELEASE Right 05/22/2019   Procedure: RIGHT CARPAL TUNNEL RELEASE;  Surgeon: Daryll Brod, MD;  Location: [facility];  Service: Orthopedics;  Laterality: Right;  . CARPOMETACARPEL SUSPENSION PLASTY Right 05/22/2019   Procedure: ARTHROPLASY CARPOMETACARPAL RIGHT THUMB TRAPEZIUM EXCISION TENDON TRANSFER MICRO LINK SUSPENSION;  Surgeon: Daryll Brod, MD;  Location: [facility];  Service: Orthopedics;  Laterality: Right;  . CESAREAN SECTION  1986  . COLONOSCOPY W/ BIOPSIES AND POLYPECTOMY    . EYE SURGERY Bilateral 2012   "lasered a hole in my iris cause pressure was building up"  . LAPAROSCOPIC CHOLECYSTECTOMY  1990's  . NASAL SINUS SURGERY  1980's  . POSTERIOR LUMBAR FUSION  05/2013   L4-5; S1  . POSTERIOR LUMBAR FUSION  11/04/2014   L3-4  . SHOULDER ARTHROSCOPY W/ ROTATOR CUFF REPAIR Right 2012  . TUBAL LIGATION  1992     OB History   No obstetric history on file.     Family History  Problem Relation Age of Onset  . Asthma Mother   . Lung cancer Mother   . Heart disease Father   . Heart disease Maternal Grandfather        PGF  . Alcohol abuse Maternal Grandfather   . Hyperlipidemia Maternal Grandfather   . Anxiety disorder Sister   . Fibromyalgia Sister   . Nephrolithiasis Daughter     Social History   Tobacco Use  . Smoking status: Never Smoker  . Smokeless tobacco: Never Used  Substance Use Topics  . Alcohol use: Yes    Alcohol/week: 1.0 standard drinks    Types: 1 Glasses of wine per week    Comment: occasional  . Drug use: No    Home Medications Prior to Admission medications   Medication Sig Start Date End Date Taking? Authorizing Provider  acetaminophen (TYLENOL) 500 MG tablet Take 1,000 mg by mouth 3 (three) times daily.     [provider]  amLODipine (NORVASC) 10 MG tablet Take 10 mg by mouth daily. 11/29/19   [provider]  aspirin EC 81 MG tablet Take 81 mg by mouth at bedtime.    [provider]  BIOTIN PO Take 1 mL by mouth daily.    [provider]  Cholecalciferol (VITAMIN D-3 PO) Take 0.25 mLs by mouth daily.  High Potency Vitamin D3 Liquid    [provider]  COLLAGEN PO Take 20 g by mouth daily. Vital Proteins Collagen Peptides    [provider]  desvenlafaxine (PRISTIQ) 50 MG 24 hr tablet TAKE 1 TABLET BY MOUTH  DAILY 12/17/19   Dohmeier, Asencion Partridge, MD  divalproex (DEPAKOTE ER) 250 MG 24 hr tablet TAKE 1 TABLET BY MOUTH  DAILY Patient taking differently: Take 250 mg by mouth daily.  08/19/19   Dohmeier, Asencion Partridge, MD  doxylamine, Sleep, (UNISOM) 25 MG tablet Take 25 mg by mouth at bedtime.    [provider]  hydrocortisone (CORTEF) 10 MG tablet Take 25mg  (2 1/2 tab) twice daily for 2  days then take 20mg (2 tabs)twice a day for 3 days then take 10mg (1 tab)in the morning and 20mg (2 tabs at night for 30 days. Patient not taking: Reported on 12/01/2019 04/04/19   Radene Gunning, NP  hydroxychloroquine (PLAQUENIL) 200 MG tablet Take 200 mg by mouth 2 (two) times daily. 11/25/19   [provider]  levocetirizine (XYZAL) 5 MG tablet Take 5 mg by mouth at bedtime.    [provider]  levothyroxine (SYNTHROID) 175 MCG tablet Take 1 tablet (175 mcg total) by mouth daily before breakfast. 04/04/19   Black, Lezlie Octave, NP  losartan (COZAAR) 100 MG tablet Take 100 mg by mouth daily. 11/12/19   [provider]  Melatonin 10 MG TBDP Take 10 mg by mouth at bedtime.    [provider]  niacin (NIASPAN) 500 MG CR tablet Take 500 mg by mouth at bedtime.    [provider]  Omega-3 Fatty Acids (FISH OIL) 1000 MG CAPS Take 1,000 mg by mouth at bedtime.     [provider]  OVER THE COUNTER MEDICATION Take 2 mLs by mouth daily.    [provider]  oxyCODONE-acetaminophen (PERCOCET/ROXICET) 5-325 MG tablet Take 1-2 tablets by mouth every 3 (three) hours as needed for moderate pain or severe pain. 12/10/19   Kristeen Miss, MD  predniSONE (DELTASONE) 5 MG tablet Take 10 mg by mouth daily at 12 noon. 11/03/19   [provider]  Probiotic Product  (PROBIOTIC PO) Take 1 capsule by mouth daily.     [provider]    Allergies    Ultram [tramadol], Adhesive [tape], Erythromycin, Latex, Neurontin [gabapentin], and Sulfonamide derivatives  Review of Systems   Review of Systems  Constitutional: Positive for activity change, appetite change and fatigue. Negative for fever.  HENT: Negative for congestion and rhinorrhea.   Eyes: Negative for visual disturbance.  Respiratory: Negative for cough, chest tightness and shortness of breath.   Cardiovascular: Negative for chest pain.  Gastrointestinal: Positive for abdominal pain and nausea. Negative for vomiting.  Genitourinary: Negative for dysuria and hematuria.  Musculoskeletal: Positive for back pain. Negative for arthralgias and myalgias.  Skin: Negative for rash.  Neurological: Negative for dizziness, weakness, light-headedness and headaches.   all other systems are negative except as noted in the HPI and PMH. \   Physical Exam Updated Vital Signs BP 140/87   Pulse 84   Temp 98.5 F (36.9 C)   Resp 16   Ht 5\' 7"  ([id number] m)   Wt 70 kg   SpO2 100%   BMI 24.17 kg/m   Physical Exam Vitals and nursing note reviewed.  Constitutional:      General: She is not in acute distress.    Appearance: She is well-developed.  HENT:     Head: Normocephalic and atraumatic.     Mouth/Throat:     Pharynx: No oropharyngeal exudate.  Eyes:     Conjunctiva/sclera: Conjunctivae normal.     Pupils: Pupils are equal, round, and reactive to light.  Neck:     Comments: No meningismus. Cardiovascular:     Rate and Rhythm: Normal rate and regular rhythm.     Heart sounds: Normal heart sounds. No murmur.  Pulmonary:     Effort: Pulmonary effort is normal. No respiratory distress.     Breath sounds: Normal breath sounds.  Abdominal:     Palpations: Abdomen is soft.     Tenderness: There is abdominal tenderness. There is no guarding or rebound.  Comments: Mild diffuse tenderness,  worse in the epigastrium  Musculoskeletal:        General: No tenderness. Normal range of motion.     Cervical back: Normal range of motion and neck supple.     Comments: Appropriately healing lumbar and right flank incisions without surrounding erythema or drainage  5/5 strength in bilateral lower extremities. Ankle plantar and dorsiflexion intact. Great toe extension intact bilaterally. +2 DP and PT pulses. +2 patellar reflexes bilaterally. Normal gait.   Skin:    General: Skin is warm.     Capillary Refill: Capillary refill takes less than 2 seconds.  Neurological:     General: No focal deficit present.     Mental Status: She is alert and oriented to person, place, and time. Mental status is at baseline.     Cranial Nerves: No cranial nerve deficit.     Motor: No abnormal muscle tone.     Coordination: Coordination normal.     Comments:  5/5 strength throughout. CN 2-12 intact.Equal grip strength.   Psychiatric:        Behavior: Behavior normal.     ED Results / Procedures / Treatments   Labs (all labs ordered are listed, but only abnormal results are displayed) Labs Reviewed  URINALYSIS, ROUTINE W REFLEX MICROSCOPIC - Abnormal; Notable for the following components:      Result Value   Specific Gravity, Urine >1.030 (*)    Hgb urine dipstick TRACE (*)    All other components within normal limits  CBC WITH DIFFERENTIAL/PLATELET - Abnormal; Notable for the following components:   Abs Immature Granulocytes 0.10 (*)    All other components within normal limits  COMPREHENSIVE METABOLIC PANEL - Abnormal; Notable for the following components:   Sodium 134 (*)    Glucose, Bld 100 (*)    Creatinine, Ser 1.05 (*)    AST 62 (*)    ALT 60 (*)    GFR calc non Af Amer 56 (*)    All other components within normal limits  URINALYSIS, MICROSCOPIC (REFLEX) - Abnormal; Notable for the following components:   Bacteria, UA RARE (*)    All other components within normal limits  LIPASE,  BLOOD  TROPONIN I (HIGH SENSITIVITY)  TROPONIN I (HIGH SENSITIVITY)    EKG EKG Interpretation  Date/Time:  Wednesday December 17 2019 15:42:02 EST Ventricular Rate:  69 PR Interval:    QRS Duration: 83 QT Interval:  410 QTC Calculation: 440 R Axis:   44 Text Interpretation: Sinus rhythm Consider left atrial enlargement No significant change was found Confirmed by Ezequiel Essex [phone]) on 12/17/2019 3:45:30 PM   Radiology CT ABDOMEN PELVIS W CONTRAST  Result Date: 12/17/2019 CLINICAL DATA:  Epigastric pain. EXAM: CT ABDOMEN AND PELVIS WITH CONTRAST TECHNIQUE: Multidetector CT imaging of the abdomen and pelvis was performed using the standard protocol following bolus administration of intravenous contrast. CONTRAST:  182mL OMNIPAQUE IOHEXOL 300 MG/ML  SOLN COMPARISON:  None. FINDINGS: Lower chest: No acute abnormality. Hepatobiliary: No focal liver abnormality is seen. There is mild to moderate severity intrahepatic biliary dilatation. Status post cholecystectomy. Dilatation of the common bile duct is also noted (1.4 cm). Pancreas: Unremarkable. No pancreatic ductal dilatation or surrounding inflammatory changes. Spleen: Normal in size without focal abnormality. Adrenals/Urinary Tract: Adrenal glands are unremarkable. Kidneys are normal, without renal calculi or hydronephrosis. A 1.2 cm simple cyst is seen along the posterolateral aspect of the mid to lower left kidney. Bladder is unremarkable. Stomach/Bowel: Stomach is within normal  limits. A 7 mm appendicolith is seen along the base of a normal appearing appendix. No evidence of bowel wall thickening, distention, or inflammatory changes. Vascular/Lymphatic: No significant vascular findings are present. No enlarged abdominal or pelvic lymph nodes. Reproductive: Uterus and bilateral adnexa are unremarkable. Other: No abdominal wall hernia or abnormality. No abdominopelvic ascites. Musculoskeletal: Bilateral pedicle screws are seen throughout the  lumbar spine. There is associated streak artifact and subsequently limited evaluation of the adjacent osseous and soft tissue structures. IMPRESSION: 1. Status post cholecystectomy. 2. Appendicolith without evidence of appendicitis. 3. Extensive postoperative changes throughout the lumbar spine. Electronically Signed   By: Virgina Norfolk M.D.   On: 12/17/2019 17:25    Procedures Procedures (including critical care time)  Medications Ordered in ED Medications  sodium chloride 0.9 % bolus 1,000 mL (has no administration in time range)  fentaNYL (SUBLIMAZE) injection 50 mcg (has no administration in time range)  ondansetron (ZOFRAN) injection 4 mg (has no administration in time range)    ED Course  I have reviewed the triage vital signs and the nursing notes.  Pertinent labs & imaging results that were available during my care of the patient were reviewed by me and considered in my medical decision making (see chart for details).    MDM Rules/Calculators/A&P                      Epigastric abdominal pain similar to previous episodes of pancreatitis.  Did have back surgery 1 week ago.  History of alcohol abuse and previous cholecystectomy.  Denies any dark or bloody stools  Patient given IV hydration and symptom control.  Her labs are reassuring with normal lipase and LFTs.  Will obtain CT imaging. No evidence of pancreatitis on labs or imaging EKG is sinus rhythm.  Low suspicion for anginal equivalent. Troponin negative.   CT scan is reassuring without acute pathology.  Clinically with no evidence of appendicitis.  No right lower quadrant pain. Her pain may also be due to her recent constipation from her recent surgery.  Abdomen soft on recheck.  Patient tolerating p.o.  Will start PPI.  Avoid alcohol, NSAIDs, caffeine, spicy foods.  She does take steroids chronically which may be causing irritation to her stomach.  Continue PPI.  Follow-up with your PCP as well as GI.  Return  precautions discussed  Final Clinical Impression(s) / ED Diagnoses Final diagnoses:  Epigastric abdominal pain    Rx / DC Orders ED Discharge Orders    None       Marshawn Normoyle, Annie Main, MD 12/17/19 2205

## 2020-04-15 ENCOUNTER — Other Ambulatory Visit: Payer: Self-pay | Admitting: Neurological Surgery

## 2020-04-15 ENCOUNTER — Other Ambulatory Visit (HOSPITAL_COMMUNITY): Payer: Self-pay | Admitting: Neurological Surgery

## 2020-04-15 DIAGNOSIS — M5416 Radiculopathy, lumbar region: Secondary | ICD-10-CM

## 2020-05-19 ENCOUNTER — Ambulatory Visit (HOSPITAL_COMMUNITY)
Admission: RE | Admit: 2020-05-19 | Discharge: 2020-05-19 | Disposition: A | Discharge status: Home / Self Care | Payer: 59 | Source: Ambulatory Visit | Attending: Neurological Surgery | Admitting: Neurological Surgery

## 2020-05-19 ENCOUNTER — Other Ambulatory Visit: Payer: Self-pay

## 2020-05-19 DIAGNOSIS — M5416 Radiculopathy, lumbar region: Secondary | ICD-10-CM | POA: Diagnosis not present

## 2020-05-19 DIAGNOSIS — Z981 Arthrodesis status: Secondary | ICD-10-CM | POA: Insufficient documentation

## 2020-05-19 DIAGNOSIS — M48061 Spinal stenosis, lumbar region without neurogenic claudication: Secondary | ICD-10-CM | POA: Insufficient documentation

## 2020-05-19 MED ORDER — HYDROCODONE-ACETAMINOPHEN 5-325 MG PO TABS
ORAL_TABLET | ORAL | Status: AC
  Filled 2020-05-19: qty 1

## 2020-05-19 MED ORDER — ONDANSETRON HCL 4 MG/2ML IJ SOLN: 4.0000 mg | Freq: Four times a day (QID) | INTRAMUSCULAR | Status: DC | PRN

## 2020-05-19 MED ORDER — IOHEXOL 180 MG/ML  SOLN
20.0000 mL | Freq: Once | INTRAMUSCULAR | Status: AC | PRN
  Administered 2020-05-19: 12 mL via INTRATHECAL

## 2020-05-19 MED ORDER — LIDOCAINE HCL (PF) 1 % IJ SOLN
5.0000 mL | Freq: Once | INTRAMUSCULAR | Status: AC
  Administered 2020-05-19: 5 mL via INTRADERMAL

## 2020-05-19 MED ORDER — DIAZEPAM 5 MG PO TABS
10.0000 mg | ORAL_TABLET | Freq: Once | ORAL | Status: AC
  Administered 2020-05-19: 10 mg via ORAL
  Filled 2020-05-19: qty 2

## 2020-05-19 MED ORDER — HYDROCODONE-ACETAMINOPHEN 5-325 MG PO TABS
1.0000 | ORAL_TABLET | ORAL | Status: DC | PRN
  Administered 2020-05-19: 1 via ORAL

## 2020-05-19 NOTE — Discharge Instructions (Signed)
Myelogram and Lumbar Puncture Discharge Instructions  1. Go home and rest quietly for the next 24 hours.  It is important to lie flat for the next 24 hours.  Get up only to go to the restroom.  You may lie in the bed or on a couch on your back, your stomach, your left side or your right side.  You may have one pillow under your head.  You may have pillows between your knees while you are on your side or under your knees while you are on your back.  2. DO NOT drive today.  Recline the seat as far back as it will go, while still wearing your seat belt, on the way home.  3. You may get up to go to the bathroom as needed.  You may sit up for 10 minutes to eat.  You may resume your normal diet and medications unless otherwise indicated.  4. The incidence of headache, nausea, or vomiting is about 5% (one in 20 patients).  If you develop a headache, lie flat and drink plenty of fluids until the headache goes away.  Caffeinated beverages may be helpful.  If you develop severe nausea and vomiting or a headache that does not go away with flat bed rest, call Dr Elsner's office.  5. You may resume normal activities after your 24 hours of bed rest is over; however, do not exert yourself strongly or do any heavy lifting tomorrow.  6. Call your physician for a follow-up appointment.  The results of your myelogram will be sent directly to your physician by the following day.  7. If you have any questions or if complications develop after you arrive home, please call Dr Elsner's office.  Discharge instructions have been explained to the patient.  The patient, or the person responsible for the patient, fully understands these instructions.   

## 2020-05-19 NOTE — Procedures (Signed)
Crystal Larsen is a 64 year old individual who has had significant issues of back pain and more recently left lower extremity pain.  She underwent fusion from L2 to the sacrum and more recently had decompression and fusion at L2-3 via an anterolateral technique.  Nonetheless she has had weakness in the left lower extremity with pain that has evolved and further work-up at this time is felt to be best done with a myelogram and a post myelogram CAT scan secondary to the proximity of significant titanium and metallic artifact from the implanted hardware.  Pre op Dx: Lumbar radiculopathy, history of lumbar fusion L2 to sacrum Post op Dx: Same Procedure: Lumbar myelogram Surgeon: Lenna Hagarty Puncture level: L3-4 Fluid color: Clear colorless Injection: Iohexol 200, 12 mL Findings: Patulous sac without obvious areas of compression on myelography adjacent level disease appears mild, further evaluation with CT scanning

## 2020-06-03 IMAGING — US US RENAL
1 series · 14 of 22 positions shown · non-contrast
Comparison: 02/23/2016 MRI of the abdomen. 09/03/2015 renal
ultrasound.

CLINICAL DATA: 62 y/o  F; acute kidney injury.

EXAM:
RENAL / URINARY TRACT ULTRASOUND COMPLETE

[Series 1: us renal · 14 of 22 slices shown]
[im 1/22]
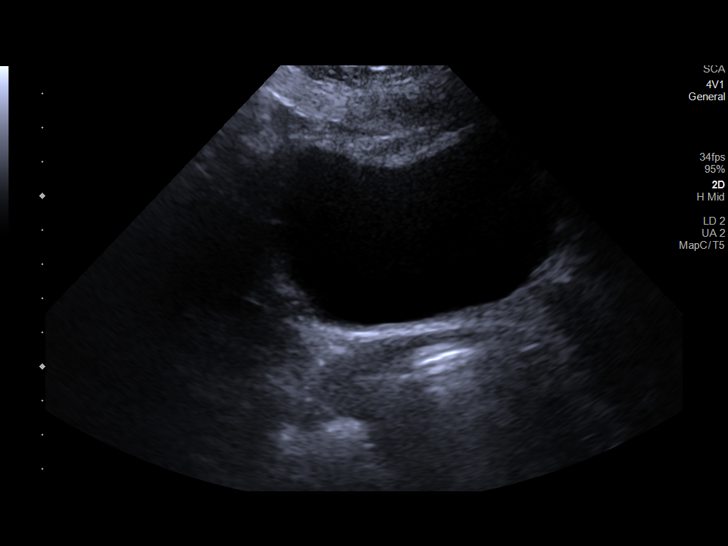
[im 3/22]
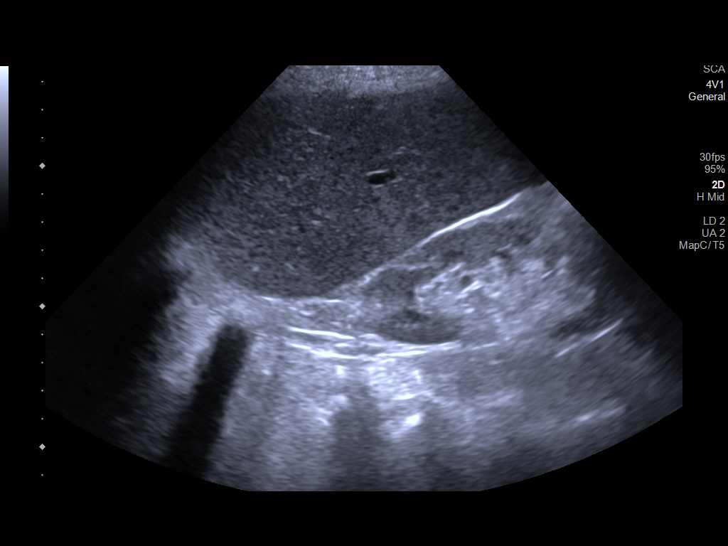
[im 4/22]
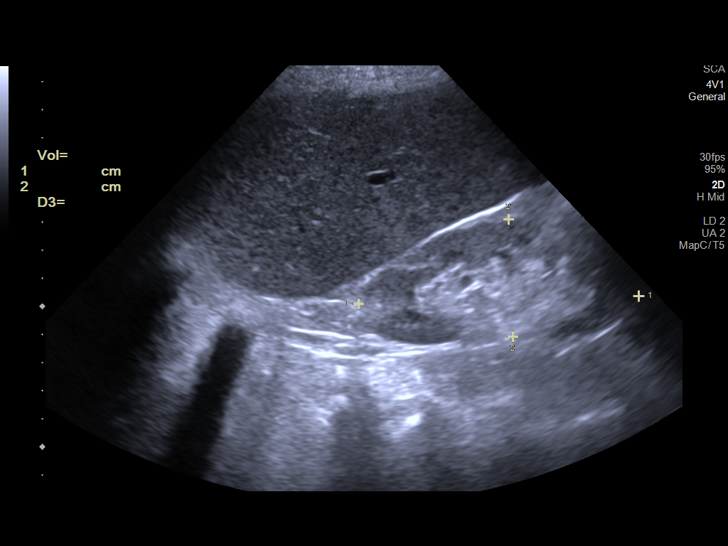
[im 6/22]
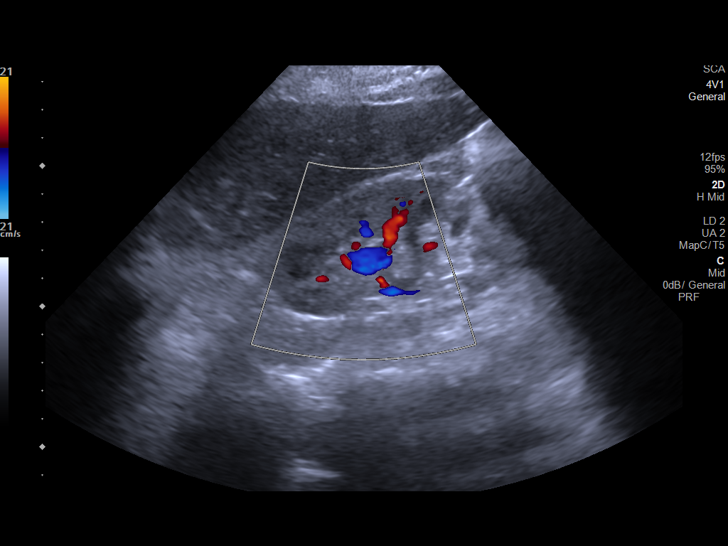
[im 8/22]
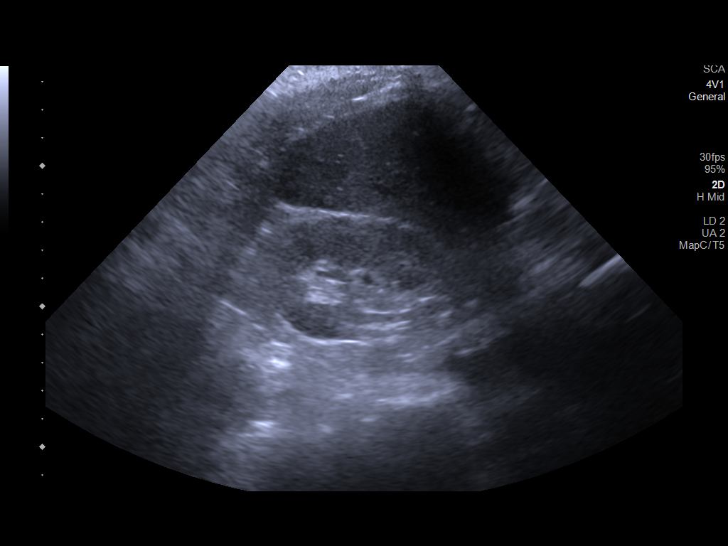
[im 9/22]
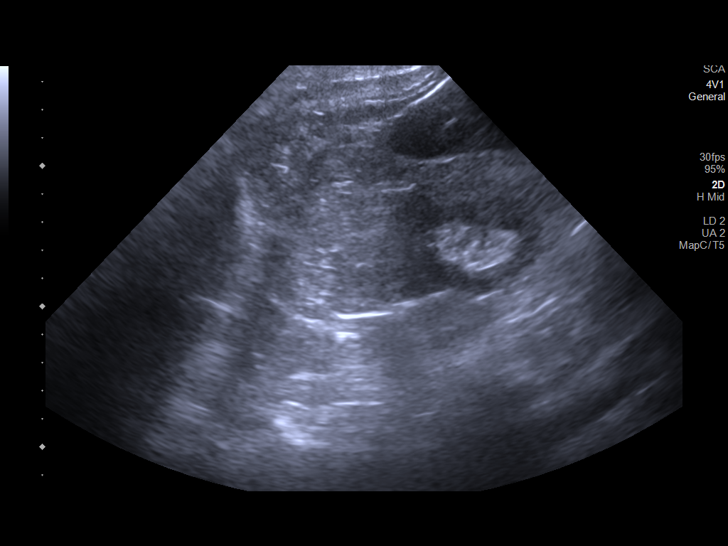
[im 11/22]
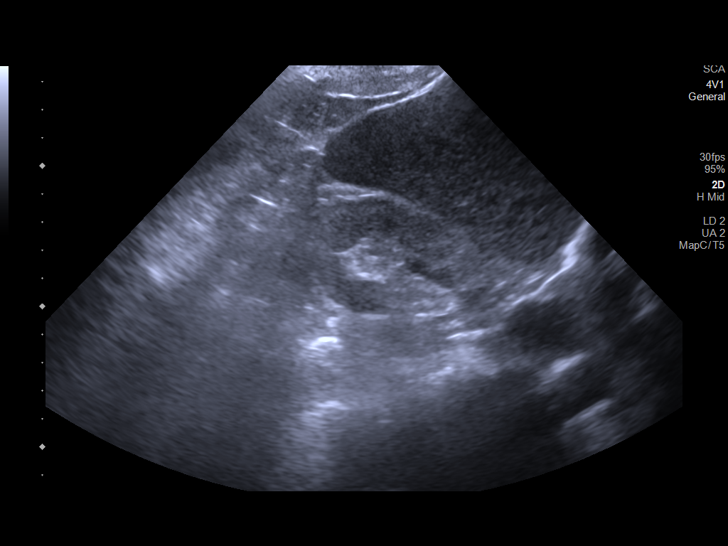
[im 12/22]
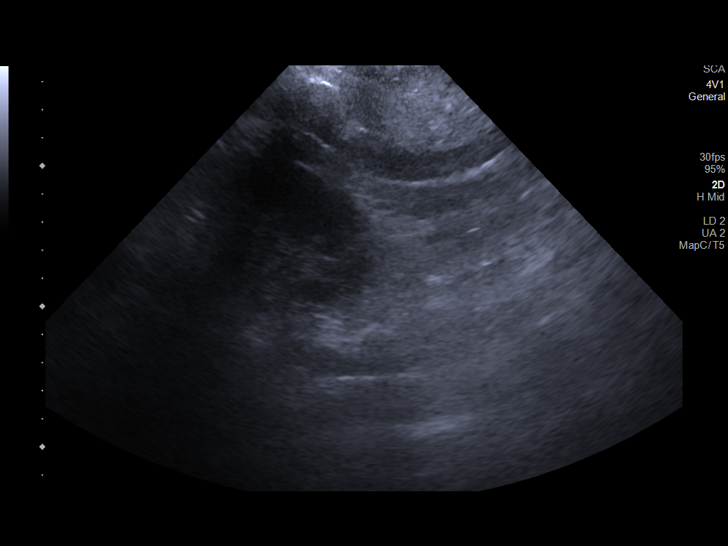
[im 14/22]
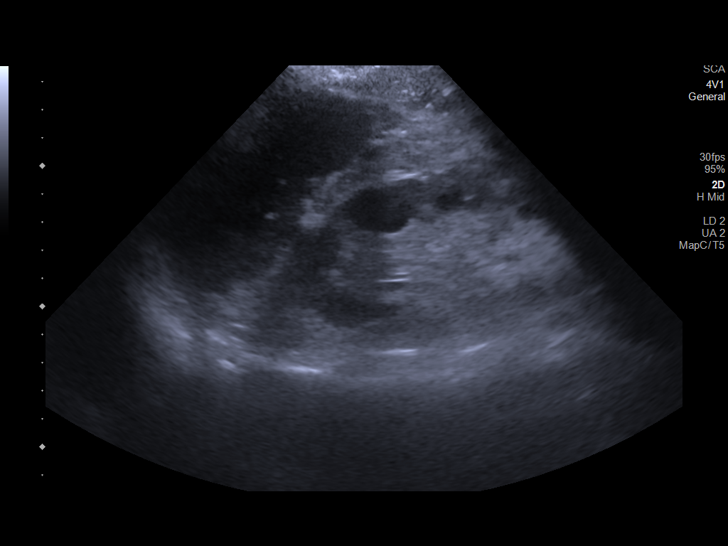
[im 15/22]
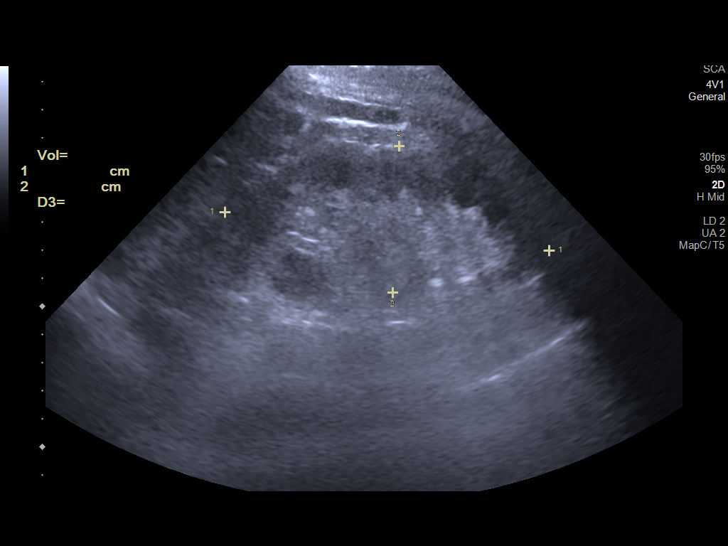
[im 17/22]
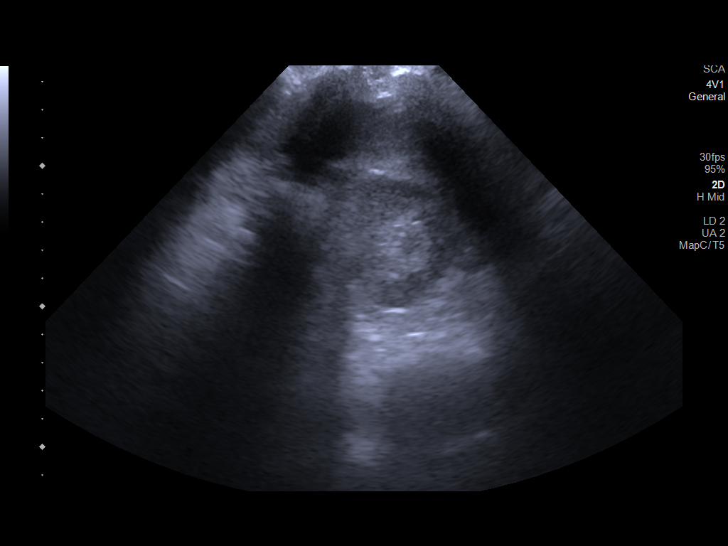
[im 19/22]
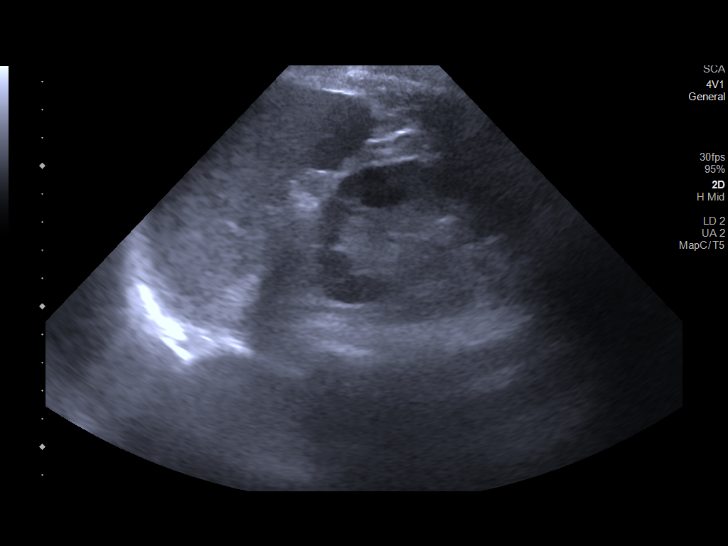
[im 20/22]
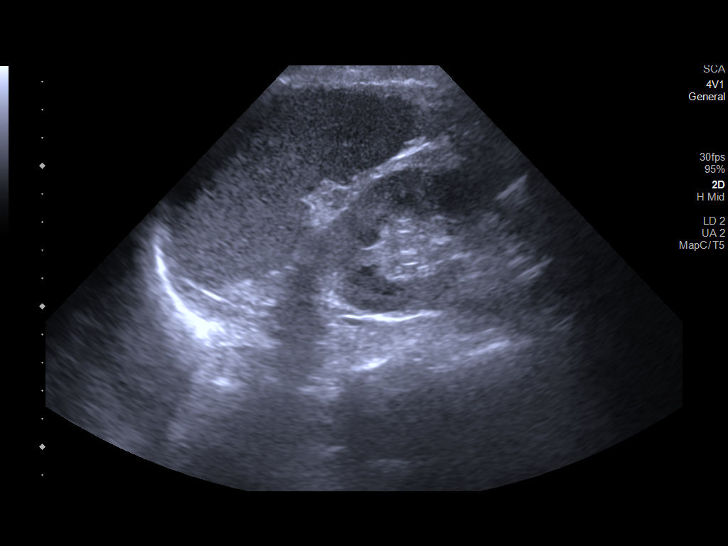
[im 22/22]
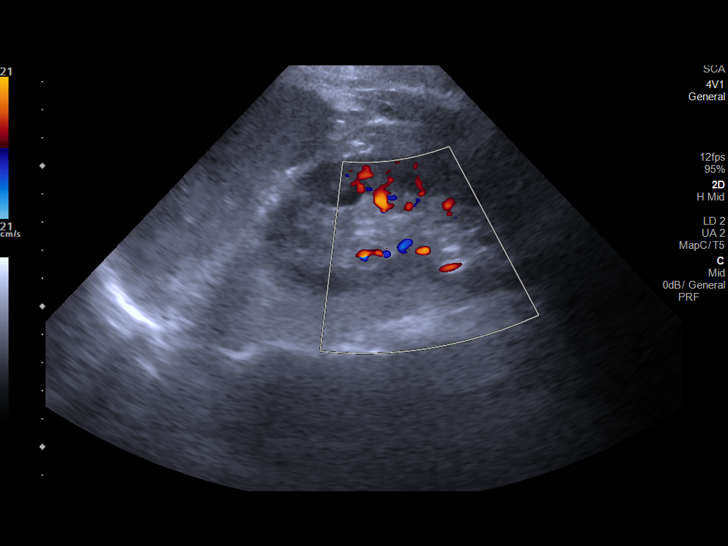

[14 of 22 positions shown; findings below may reference images not displayed]

FINDINGS: Right Kidney:

Renal measurements: 9.9 x 4.1 x 3.9 cm = volume: 86 mL .
Echogenicity within normal limits. No mass or hydronephrosis
visualized.

Left Kidney:

Renal measurements: 11.6 x 5.2 x 4.7 cm = volume: 149 mL.
Echogenicity within normal limits. No mass or hydronephrosis
visualized.

Bladder:

Appears normal for degree of bladder distention.
IMPRESSION: No acute process identified. Increased asymmetry in kidney size in
comparison with prior studies, possibly sampling error versus
interval right kidney atrophy.

## 2020-06-30 ENCOUNTER — Other Ambulatory Visit: Payer: Self-pay | Admitting: Neurology

## 2020-07-01 ENCOUNTER — Telehealth: Payer: Self-pay | Admitting: Family Medicine

## 2020-07-01 NOTE — Telephone Encounter (Signed)
Pt is needing her desvenlafaxine (PRISTIQ) 50 MG 24 hr tablet filled and sent in [facility] Pt has scheduled a yearly f/u

## 2020-07-06 NOTE — Addendum Note (Signed)
Addended by: Elita Quick on: 07/06/2020 04:27 PM   Modules accepted: Orders

## 2020-07-06 NOTE — Telephone Encounter (Signed)
Pt has called to check on the request for the refill on her desvenlafaxine (PRISTIQ) 50 MG 24 hr tablet being sent to [facility].

## 2020-07-07 MED ORDER — DESVENLAFAXINE SUCCINATE ER 50 MG PO TB24: 50.0000 mg | ORAL_TABLET | Freq: Every day | ORAL | 1 refills | Status: DC

## 2020-07-07 NOTE — Addendum Note (Signed)
Addended by: Elita Quick on: 07/07/2020 10:20 AM   Modules accepted: Orders

## 2020-07-07 NOTE — Telephone Encounter (Signed)
medication has been sent to the pharmacy

## 2020-07-07 NOTE — Telephone Encounter (Signed)
Please call pt when the medication has been called in for her.

## 2020-07-08 MED ORDER — DESVENLAFAXINE SUCCINATE ER 50 MG PO TB24: 50.0000 mg | ORAL_TABLET | Freq: Every day | ORAL | 1 refills | Status: DC

## 2020-07-08 NOTE — Telephone Encounter (Signed)
Pt has called to report that she has been told that her medication -desvenlafaxine (PRISTIQ) 50 MG 24 hr tablet was called into her local [place] (where she is only able to get a 30 day supply) Pt states the reason why she requested it go thru [facility] is because she is able to get a 90 day.  Pt is asking for a call as to if there is a problem with the order being sent to [facility].  Please call

## 2020-07-08 NOTE — Addendum Note (Signed)
Addended by: Minna Antis on: 07/08/2020 05:05 PM   Modules accepted: Orders

## 2020-07-08 NOTE — Telephone Encounter (Signed)
Change was made, patient notifed via my chart.

## 2020-07-28 ENCOUNTER — Ambulatory Visit: Payer: 59 | Attending: Family Medicine | Admitting: Physical Therapy

## 2020-07-28 ENCOUNTER — Encounter: Payer: Self-pay | Admitting: Physical Therapy

## 2020-07-28 ENCOUNTER — Other Ambulatory Visit: Payer: Self-pay

## 2020-07-28 DIAGNOSIS — M545 Low back pain, unspecified: Secondary | ICD-10-CM

## 2020-07-28 DIAGNOSIS — M25551 Pain in right hip: Secondary | ICD-10-CM

## 2020-07-28 DIAGNOSIS — M6281 Muscle weakness (generalized): Secondary | ICD-10-CM

## 2020-07-28 DIAGNOSIS — G8929 Other chronic pain: Secondary | ICD-10-CM

## 2020-07-28 DIAGNOSIS — R2681 Unsteadiness on feet: Secondary | ICD-10-CM

## 2020-07-28 NOTE — Patient Instructions (Signed)
Access Code: [email or web address] URL: [email or web address] Date: 07/28/2020 Prepared by: Almyra Free  Exercises Supine Hip Internal and External Rotation - 1 x daily - 7 x weekly - 1 sets - 2 reps - 30 sec hold Supine Quadratus Lumborum Stretch - 2 x daily - 7 x weekly - 1 sets - 3 reps - 30-6060 sec hold Supine ITB Stretch with Strap - 2 x daily - 7 x weekly - 3 reps - 1 sets - 60 sec hold Supine Hamstring Stretch with Strap - 2 x daily - 7 x weekly - 3 reps - 1 sets - 60 sec hold

## 2020-07-28 NOTE — Therapy (Signed)
[facility] [address] [place], [place], [address] Phone: [phone]   Fax:  [phone]  Physical Therapy Evaluation  Patient Details  Name: Crystal Larsen MRN: [id number] Date of Birth: 1956-08-14 Referring Provider (PT): Jonathon Jordan MD   Encounter Date: 07/28/2020   PT End of Session - 07/28/20 0759    Visit Number 1    Date for PT Re-Evaluation 09/22/20    PT Start Time 0759    PT Stop Time 0846    PT Time Calculation (min) 47 min    Activity Tolerance Patient tolerated treatment well    Behavior During Therapy [facility] for tasks assessed/performed           Past Medical History:  Diagnosis Date  . Acute kidney injury ([place])   . Adrenal insufficiency ([place])   . Anemia   . Anxiety   . Arthritis    "left foot; left hand; right shoulder; back" (11/04/2014)  . Elevated liver enzymes   . Gallstones   . GERD (gastroesophageal reflux disease)   . History of gout   . HTN (hypertension)   . Hypothyroidism   . IBS (irritable bowel syndrome)    ?  [name] Lupus ([place]) 07/2019  . Metabolic acidosis   . Migraine    "maybe once/yr" (11/04/2014)  . Neuropathy    right leg  . Obesity   . Pancreatitis   . Pancreatitis   . PONV (postoperative nausea and vomiting) 1981 X 1   only time  . Seizures ([place]) 2003    due to tramadol reaction -- takes depakote for migraine prevention  . Sleep apnea    does not use cpap  . Spondylolisthesis of lumbar region   . Unsteady gait   . Wears glasses     Past Surgical History:  Procedure Laterality Date  . ANKLE GANGLION CYST EXCISION Right 1980's  . ANTERIOR LAT LUMBAR FUSION N/A 12/09/2019   Procedure: Lumbar One-Two Anterolateral lumbar decompression/interbody fusion with lateral plate;  Surgeon: Kristeen Miss, MD;  Location: [place];  Service: Neurosurgery;  Laterality: N/A;  Lumbar One-Two Anterolateral lumbar decompression/interbody fusion with lateral plate  . BACK SURGERY     . BREAST SURGERY     biopsy  . BUNIONECTOMY Left 2012  . CARPAL TUNNEL RELEASE Right 05/22/2019   Procedure: RIGHT CARPAL TUNNEL RELEASE;  Surgeon: Daryll Brod, MD;  Location: [place];  Service: Orthopedics;  Laterality: Right;  . CARPOMETACARPEL SUSPENSION PLASTY Right 05/22/2019   Procedure: ARTHROPLASY CARPOMETACARPAL RIGHT THUMB TRAPEZIUM EXCISION TENDON TRANSFER MICRO LINK SUSPENSION;  Surgeon: Daryll Brod, MD;  Location: [place];  Service: Orthopedics;  Laterality: Right;  . CESAREAN SECTION  1986  . COLONOSCOPY W/ BIOPSIES AND POLYPECTOMY    . EYE SURGERY Bilateral 2012   "lasered a hole in my iris cause pressure was building up"  . LAPAROSCOPIC CHOLECYSTECTOMY  1990's  . NASAL SINUS SURGERY  1980's  . POSTERIOR LUMBAR FUSION  05/2013   L4-5; S1  . POSTERIOR LUMBAR FUSION  11/04/2014   L3-4  . SHOULDER ARTHROSCOPY W/ ROTATOR CUFF REPAIR Right 2012  . TUBAL LIGATION  1992    There were no vitals filed for this visit.    Subjective Assessment - 07/28/20 0805    Subjective In April she began having increased pain in left hip and low back. By May and June moved to right hip and back and right side feels weak. Patient now walking with  cane in LUE in community x 2 months. Stopped gym 3 years ago. Volunteers at hospital. Never had back pain until after the last surgery always down right leg.    Pertinent History Lupus, CTS right hand and thumb surgery, hypothyroid, lumbar fusion January 2021 L1/L2 (fused to L5/S1), Rt RCR 2013, OA, HTN, neuropathy    How long can you walk comfortably? with cane 10-15 min (level ground is better)    Diagnostic tests MRI/Myelogram: new mild retrolisthesis @ L1/L2, increased foraminal narrowing    Patient Stated Goals not have pain and weakness and be able to walk without a cane    Currently in Pain? Yes    Pain Score 4    with standing and walking   Pain Location Back    Pain Orientation Right;Lower;Left   greater on  right   Pain Descriptors / Indicators Sharp    Pain Type Chronic pain    Pain Radiating Towards ant right thigh    Pain Onset More than a month ago    Pain Frequency Intermittent    Aggravating Factors  standing and walking    Pain Relieving Factors sitting    Effect of Pain on Daily Activities limits working out              [facility] PT Assessment - 07/28/20 0001      Assessment   Medical Diagnosis dorsalgia; pain in uspecified joint; systemic lupus    Referring Provider (PT) Jonathon Jordan MD    Onset Date/Surgical Date 02/26/20    Hand Dominance Right    Next MD Visit end of this month    Prior Therapy yes      Precautions   Precautions None      Restrictions   Weight Bearing Restrictions No      Balance Screen   Has the patient fallen in the past 6 months No    Has the patient had a decrease in activity level because of a fear of falling?  No    Is the patient reluctant to leave their home because of a fear of falling?  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      Observation/Other Assessments   Focus on Therapeutic Outcomes (FOTO)  56% limited      Posture/Postural Control   Posture Comments even ilia; depressed left scapula and shoulder      ROM / Strength   AROM / PROM / Strength AROM;Strength      AROM   Overall AROM Comments left hip IR mildly limited    AROM Assessment Site Lumbar    Lumbar Flexion hands to knees    Lumbar Extension WFL    Lumbar - Right Side Bend 75% limited    Lumbar - Left Side Bend 50% limited   painful on left in back   Lumbar - Right Rotation 25% limited    Lumbar - Left Rotation 25% limited      Strength   Overall Strength Comments bil knees 5/5, ankle DF 5/5    Strength Assessment Site Hip    Right/Left Hip  Right;Left    Right Hip Flexion 4/5    Right Hip Extension 4+/5    Right Hip  ABduction 4-/5    Left Hip Flexion 4+/5    Left Hip Extension 4+/5    Left Hip ABduction 4/5      Flexibility   Soft Tissue Assessment /Muscle Length yes    Hamstrings mild bil tightness    Quadriceps mild bil tightness    ITB left +ober; Rt WNL    Piriformis mild right side    Quadratus Lumborum tight left > right    Obturator Internus tightness going into IR on left      Palpation   Palpation comment bil lumbar/QL, bil gluteus min; greater trochanter on right      Transfers   Five time sit to stand comments  13.46      Ambulation/Gait   Gait Comments amb with bil hip ER and trendelenberg bil      Balance   Balance Assessed --   SLS < 5 sec bil                     Objective measurements completed on examination: See above findings.               PT Education - 07/28/20 0845    Education Details HEP    Person(s) Educated Patient    Methods Explanation;Demonstration;Handout    Comprehension Verbalized understanding;Returned demonstration            PT Short Term Goals - 07/28/20 1212      PT SHORT TERM GOAL #1   Title Ind with initial HEP    Time 2    Period Weeks    Status New    Target Date 08/11/20             PT Long Term Goals - 07/28/20 1212      PT LONG TERM GOAL #1   Title Patient to report decreased pain in back and hips by 70% or more with standing.    Time 8    Period Weeks    Status New    Target Date 09/22/20      PT LONG TERM GOAL #2   Title Patient able to safely amb community distances without AD    Time 8    Period Weeks    Status New    Target Date 09/22/20      PT LONG TERM GOAL #3   Title Improved Bil hip strength to >= 4+/5 to improve overall function    Time 8    Period Weeks    Status New      PT LONG TERM GOAL #4   Title Improved 5x sit to stand to <= 11 sec to reduce fall risk    Baseline 13.46 sec    Time 8     Period Weeks    Status New      PT LONG TERM GOAL #5   Title Improved FOTO to <= 44% limitations    Baseline 56% limtations                  Plan - 07/28/20 1154    Clinical Impression Statement Patient presents with c/o of left side hip and back pain and weakness beginning 5 months ago. Pain originally started in left hip but that has resolved. She has had mulltiple fusions (L1/2 to L5/S1) the latest being January 2021. Patient has core and bil hip weakness affecting gait and balance. She requires a SPC in the community. She also has tightness and decreased ROM in  her lumbar spine affecting her posture. Patient will benefit from PT to address these deficits.    Personal Factors and Comorbidities Comorbidity 3+;Past/Current Experience;Time since onset of injury/illness/exacerbation    Comorbidities Lupus, CTS right hand and thumb surgery, hypothyroid, lumbar fusion January 2021 L1/L2 (fused to L5/S1), Rt RCR 2013, OA, HTN, neuropathy    Examination-Activity Limitations Locomotion Level;Stand    Examination-Participation Restrictions Community Activity;Volunteer    Stability/Clinical Decision Making Evolving/Moderate complexity    Clinical Decision Making Low    Rehab Potential Good    PT Frequency 2x / week    PT Duration 8 weeks    PT Treatment/Interventions ADLs/Self Care Home Management;Aquatic Therapy;Cryotherapy;Electrical Stimulation;Moist Heat;Neuromuscular re-education;Balance training;Therapeutic exercise;Therapeutic activities;Gait training;Patient/family education;Manual techniques;Dry needling    PT Next Visit Plan Review HEP, core and hip strengthening; gait/balance    PT Home Exercise Plan [email or web address]    Consulted and Agree with Plan of Care Patient           Patient will benefit from skilled therapeutic intervention in order to improve the following deficits and impairments:  Abnormal gait, Decreased range of motion, Pain, Increased muscle spasms, Impaired  flexibility, Decreased balance, Decreased strength, Postural dysfunction  Visit Diagnosis: Chronic bilateral low back pain, unspecified whether sciatica present - Plan: PT plan of care cert/re-cert  Pain in right hip - Plan: PT plan of care cert/re-cert  Muscle weakness (generalized) - Plan: PT plan of care cert/re-cert  Unsteadiness on feet - Plan: PT plan of care cert/re-cert     Problem List Patient Active Problem List   Diagnosis Date Noted  . Other spondylosis with radiculopathy, lumbar region 12/09/2019  . Secondary adrenal insufficiency ([place]) 04/03/2019  . Metabolic acidosis, normal anion gap (NAG) 04/02/2019  . HTN (hypertension)   . Adrenal insufficiency ([place]) 04/01/2019  . Hyponatremia 04/01/2019  . Hypothyroidism 04/01/2019  . AKI (acute kidney injury) ([place]) 04/01/2019  . Lumbar stenosis with neurogenic claudication 02/19/2017  . Migraine with aura and without status migrainosus, not intractable 07/11/2016  . Spondylolisthesis of lumbar region 11/04/2014  . Postconcussional syndrome 09/30/2013  . Post-concussion headache 09/30/2013  . Esophageal reflux 10/11/2011  . Benign neoplasm of colon 10/11/2011  . Gastritis, chronic 10/11/2011  . Guaiac + stool 09/28/2011  . IBS (irritable bowel syndrome) 09/28/2011  . GERD (gastroesophageal reflux disease) 09/28/2011  . Migraine syndrome 09/28/2011   Madelyn Flavors PT 07/28/2020, 12:20 PM  [facility] [address] [place], [place], [address] Phone: [phone]   Fax:  [phone]  Name: Crystal Larsen MRN: [id number] Date of Birth: [birth year]

## 2020-08-05 ENCOUNTER — Ambulatory Visit: Payer: 59 | Admitting: Physical Therapy

## 2020-08-05 ENCOUNTER — Other Ambulatory Visit: Payer: Self-pay

## 2020-08-05 DIAGNOSIS — M25551 Pain in right hip: Secondary | ICD-10-CM

## 2020-08-05 DIAGNOSIS — M545 Low back pain, unspecified: Secondary | ICD-10-CM

## 2020-08-05 DIAGNOSIS — R2681 Unsteadiness on feet: Secondary | ICD-10-CM

## 2020-08-05 DIAGNOSIS — M6281 Muscle weakness (generalized): Secondary | ICD-10-CM

## 2020-08-05 NOTE — Therapy (Signed)
[facility] [address] [place], [place], [address] Phone: [phone]   Fax:  [phone]  Physical Therapy Treatment  Patient Details  Name: Crystal Larsen MRN: [id number] Date of Birth: 10-Jun-1956 Referring Provider (PT): Jonathon Jordan MD   Encounter Date: 08/05/2020   PT End of Session - 08/05/20 1251    Visit Number 2    Date for PT Re-Evaluation 09/22/20    PT Start Time 2671    PT Stop Time 1300    PT Time Calculation (min) 40 min    Activity Tolerance Patient tolerated treatment well;No increased pain    Behavior During Therapy WFL for tasks assessed/performed           Past Medical History:  Diagnosis Date  . Acute kidney injury ([place])   . Adrenal insufficiency ([place])   . Anemia   . Anxiety   . Arthritis    "left foot; left hand; right shoulder; back" (11/04/2014)  . Elevated liver enzymes   . Gallstones   . GERD (gastroesophageal reflux disease)   . History of gout   . HTN (hypertension)   . Hypothyroidism   . IBS (irritable bowel syndrome)    ?  [name] Lupus ([place]) 07/2019  . Metabolic acidosis   . Migraine    "maybe once/yr" (11/04/2014)  . Neuropathy    right leg  . Obesity   . Pancreatitis   . Pancreatitis   . PONV (postoperative nausea and vomiting) 1981 X 1   only time  . Seizures ([place]) 2003    due to tramadol reaction -- takes depakote for migraine prevention  . Sleep apnea    does not use cpap  . Spondylolisthesis of lumbar region   . Unsteady gait   . Wears glasses     Past Surgical History:  Procedure Laterality Date  . ANKLE GANGLION CYST EXCISION Right 1980's  . ANTERIOR LAT LUMBAR FUSION N/A 12/09/2019   Procedure: Lumbar One-Two Anterolateral lumbar decompression/interbody fusion with lateral plate;  Surgeon: Kristeen Miss, MD;  Location: [place];  Service: Neurosurgery;  Laterality: N/A;  Lumbar One-Two Anterolateral lumbar decompression/interbody fusion with lateral plate   . BACK SURGERY    . BREAST SURGERY     biopsy  . BUNIONECTOMY Left 2012  . CARPAL TUNNEL RELEASE Right 05/22/2019   Procedure: RIGHT CARPAL TUNNEL RELEASE;  Surgeon: Daryll Brod, MD;  Location: [place] [facility];  Service: Orthopedics;  Laterality: Right;  . CARPOMETACARPEL SUSPENSION PLASTY Right 05/22/2019   Procedure: ARTHROPLASY CARPOMETACARPAL RIGHT THUMB TRAPEZIUM EXCISION TENDON TRANSFER MICRO LINK SUSPENSION;  Surgeon: Daryll Brod, MD;  Location: [place];  Service: Orthopedics;  Laterality: Right;  . CESAREAN SECTION  1986  . COLONOSCOPY W/ BIOPSIES AND POLYPECTOMY    . EYE SURGERY Bilateral 2012   "lasered a hole in my iris cause pressure was building up"  . LAPAROSCOPIC CHOLECYSTECTOMY  1990's  . NASAL SINUS SURGERY  1980's  . POSTERIOR LUMBAR FUSION  05/2013   L4-5; S1  . POSTERIOR LUMBAR FUSION  11/04/2014   L3-4  . SHOULDER ARTHROSCOPY W/ ROTATOR CUFF REPAIR Right 2012  . TUBAL LIGATION  1992    There were no vitals filed for this visit.   Subjective Assessment - 08/05/20 1549    Subjective Pt states that she has been working on her HEP. She is not sure if she's doing some of them correct.    Pertinent History Lupus, CTS right hand and  thumb surgery, hypothyroid, lumbar fusion January 2021 L1/L2 (fused to L5/S1), Rt RCR 2013, OA, HTN, neuropathy    How long can you walk comfortably? with cane 10-15 min (level ground is better)    Diagnostic tests MRI/Myelogram: new mild retrolisthesis @ L1/L2, increased foraminal narrowing    Patient Stated Goals not have pain and weakness and be able to walk without a cane    Currently in Pain? No/denies    Pain Onset More than a month ago                             [facility] Adult PT Treatment/Exercise - 08/05/20 0001      Exercises   Exercises Lumbar      Lumbar Exercises: Stretches   Figure 4 Stretch 2 reps;20 seconds    Figure 4 Stretch Limitations supine     Other Lumbar Stretch  Exercise supine hooklying hip IR/ER x10 reps       Lumbar Exercises: Supine   Pelvic Tilt 10 reps;5 seconds    Pelvic Tilt Limitations VC to avoid heavy glute activation    Bridge 10 reps    Other Supine Lumbar Exercises straight leg raise x10 reps each with abdominal bracing       Lumbar Exercises: Sidelying   Clam Both;10 reps    Clam Limitations abdominal bracing                Balance Exercises - 08/05/20 0001      Balance Exercises: Standing   Standing Eyes Closed Narrow base of support (BOS);Foam/compliant surface;3 reps;20 secs    Tandem Stance Eyes open;2 reps;30 secs             PT Education - 08/05/20 1551    Education Details updated HEP    Person(s) Educated Patient    Methods Explanation;Handout;Verbal cues    Comprehension Verbalized understanding;Returned demonstration            PT Short Term Goals - 07/28/20 1212      PT SHORT TERM GOAL #1   Title Ind with initial HEP    Time 2    Period Weeks    Status New    Target Date 08/11/20             PT Long Term Goals - 07/28/20 1212      PT LONG TERM GOAL #1   Title Patient to report decreased pain in back and hips by 70% or more with standing.    Time 8    Period Weeks    Status New    Target Date 09/22/20      PT LONG TERM GOAL #2   Title Patient able to safely amb community distances without AD    Time 8    Period Weeks    Status New    Target Date 09/22/20      PT LONG TERM GOAL #3   Title Improved Bil hip strength to >= 4+/5 to improve overall function    Time 8    Period Weeks    Status New      PT LONG TERM GOAL #4   Title Improved 5x sit to stand to <= 11 sec to reduce fall risk    Baseline 13.46 sec    Time 8    Period Weeks    Status New      PT LONG TERM GOAL #5   Title Improved FOTO  to <= 44% limitations    Baseline 56% limtations                 Plan - 08/05/20 1551    Clinical Impression Statement Pt arrived for her first treatment session  following the evaluation. She had some questions regarding her technique with the HEP. PT reviewed some of this and made several updates to incorporate strengthening for the abdominals and glutes. Pt required verbal cuing initially to prevent doming of the abdominals during pelvic tilts, but she was able to demonstrate good technique by the end of the session. Pt had muscle shaking and fatigue with LE exercises. Will continue to address strength and flexibility restrictions moving forward.    Personal Factors and Comorbidities Comorbidity 3+;Past/Current Experience;Time since onset of injury/illness/exacerbation    Comorbidities Lupus, CTS right hand and thumb surgery, hypothyroid, lumbar fusion January 2021 L1/L2 (fused to L5/S1), Rt RCR 2013, OA, HTN, neuropathy    Examination-Activity Limitations Locomotion Level;Stand    Examination-Participation Restrictions Community Activity;Volunteer    Stability/Clinical Decision Making Evolving/Moderate complexity    Rehab Potential Good    PT Frequency 2x / week    PT Duration 8 weeks    PT Treatment/Interventions ADLs/Self Care Home Management;Aquatic Therapy;Cryotherapy;Electrical Stimulation;Moist Heat;Neuromuscular re-education;Balance training;Therapeutic exercise;Therapeutic activities;Gait training;Patient/family education;Manual techniques;Dry needling    PT Next Visit Plan progress core and hip strengthening; gait/balance    PT Home Exercise Plan [email or web address]    Consulted and Agree with Plan of Care Patient           Patient will benefit from skilled therapeutic intervention in order to improve the following deficits and impairments:  Abnormal gait, Decreased range of motion, Pain, Increased muscle spasms, Impaired flexibility, Decreased balance, Decreased strength, Postural dysfunction  Visit Diagnosis: Chronic bilateral low back pain, unspecified whether sciatica present  Pain in right hip  Muscle weakness (generalized)  Unsteadiness on  feet     Problem List Patient Active Problem List   Diagnosis Date Noted  . Other spondylosis with radiculopathy, lumbar region 12/09/2019  . Secondary adrenal insufficiency ([place]) 04/03/2019  . Metabolic acidosis, normal anion gap (NAG) 04/02/2019  . HTN (hypertension)   . Adrenal insufficiency ([place]) 04/01/2019  . Hyponatremia 04/01/2019  . Hypothyroidism 04/01/2019  . AKI (acute kidney injury) ([place]) 04/01/2019  . Lumbar stenosis with neurogenic claudication 02/19/2017  . Migraine with aura and without status migrainosus, not intractable 07/11/2016  . Spondylolisthesis of lumbar region 11/04/2014  . Postconcussional syndrome 09/30/2013  . Post-concussion headache 09/30/2013  . Esophageal reflux 10/11/2011  . Benign neoplasm of colon 10/11/2011  . Gastritis, chronic 10/11/2011  . Guaiac + stool 09/28/2011  . IBS (irritable bowel syndrome) 09/28/2011  . GERD (gastroesophageal reflux disease) 09/28/2011  . Migraine syndrome 09/28/2011    3:54 PM,08/05/20 Sherol Dade PT, DPT [facility] [address], [place], [place], [address] Phone: [phone]   Fax:  [phone]  Name: Crystal Larsen MRN: [id number] Date of Birth: [birth year]

## 2020-08-05 NOTE — Patient Instructions (Signed)
Access Code: [email or web address]: [email or web address]: 09/09/2021Prepared by: Felsenthal  Supine Hip Internal and External Rotation - 2 x daily - 7 x weekly - 1 sets - 2 reps - 30-60 sec hold  Supine Hamstring Stretch with Strap - 2 x daily - 7 x weekly - 3 reps - 1 sets - 60 sec hold  Supine Posterior Pelvic Tilt - 2 x daily - 7 x weekly - 10 reps - 5 seconds hold  Bridge - 1 x daily - 7 x weekly - 2 sets - 10 reps  Supine Pelvic Tilt with Straight Leg Raise - 1 x daily - 7 x weekly - 1 sets - 10 reps   [facility] [address] [place], [place] [address] Phone # [phone] Fax [phone]

## 2020-08-09 ENCOUNTER — Ambulatory Visit: Payer: 59

## 2020-08-09 ENCOUNTER — Other Ambulatory Visit: Payer: Self-pay

## 2020-08-09 DIAGNOSIS — R2681 Unsteadiness on feet: Secondary | ICD-10-CM

## 2020-08-09 DIAGNOSIS — M6281 Muscle weakness (generalized): Secondary | ICD-10-CM

## 2020-08-09 DIAGNOSIS — G8929 Other chronic pain: Secondary | ICD-10-CM

## 2020-08-09 DIAGNOSIS — M25551 Pain in right hip: Secondary | ICD-10-CM

## 2020-08-09 DIAGNOSIS — M545 Low back pain: Secondary | ICD-10-CM | POA: Diagnosis not present

## 2020-08-09 NOTE — Patient Instructions (Signed)

## 2020-08-09 NOTE — Therapy (Signed)
[facility] [address], [place], [place], [address] Phone: [phone]   Fax:  [phone]  Physical Therapy Treatment  Patient Details  Name: Crystal Larsen MRN: [id number] Date of Birth: [birth year] Referring Provider (PT): Jonathon Jordan MD   Encounter Date: 08/09/2020   PT End of Session - 08/09/20 1317    Visit Number 3    Date for PT Re-Evaluation 09/22/20    PT Start Time 1238    PT Stop Time 1316    PT Time Calculation (min) 38 min    Activity Tolerance Patient tolerated treatment well;No increased pain    Behavior During Therapy WFL for tasks assessed/performed           Past Medical History:  Diagnosis Date  . Acute kidney injury ([place])   . Adrenal insufficiency ([place])   . Anemia   . Anxiety   . Arthritis    "left foot; left hand; right shoulder; back" (11/04/2014)  . Elevated liver enzymes   . Gallstones   . GERD (gastroesophageal reflux disease)   . History of gout   . HTN (hypertension)   . Hypothyroidism   . IBS (irritable bowel syndrome)    ?  [name] Lupus ([place]) 07/2019  . Metabolic acidosis   . Migraine    "maybe once/yr" (11/04/2014)  . Neuropathy    right leg  . Obesity   . Pancreatitis   . Pancreatitis   . PONV (postoperative nausea and vomiting) 1981 X 1   only time  . Seizures ([place]) 2003    due to tramadol reaction -- takes depakote for migraine prevention  . Sleep apnea    does not use cpap  . Spondylolisthesis of lumbar region   . Unsteady gait   . Wears glasses     Past Surgical History:  Procedure Laterality Date  . ANKLE GANGLION CYST EXCISION Right 1980's  . ANTERIOR LAT LUMBAR FUSION N/A 12/09/2019   Procedure: Lumbar One-Two Anterolateral lumbar decompression/interbody fusion with lateral plate;  Surgeon: Kristeen Miss, MD;  Location: [facility];  Service: Neurosurgery;  Laterality: N/A;  Lumbar One-Two Anterolateral lumbar decompression/interbody fusion with lateral  plate  . BACK SURGERY    . BREAST SURGERY     biopsy  . BUNIONECTOMY Left 2012  . CARPAL TUNNEL RELEASE Right 05/22/2019   Procedure: RIGHT CARPAL TUNNEL RELEASE;  Surgeon: Daryll Brod, MD;  Location: [facility];  Service: Orthopedics;  Laterality: Right;  . CARPOMETACARPEL SUSPENSION PLASTY Right 05/22/2019   Procedure: ARTHROPLASY CARPOMETACARPAL RIGHT THUMB TRAPEZIUM EXCISION TENDON TRANSFER MICRO LINK SUSPENSION;  Surgeon: Daryll Brod, MD;  Location: [facility];  Service: Orthopedics;  Laterality: Right;  . CESAREAN SECTION  1986  . COLONOSCOPY W/ BIOPSIES AND POLYPECTOMY    . EYE SURGERY Bilateral 2012   "lasered a hole in my iris cause pressure was building up"  . LAPAROSCOPIC CHOLECYSTECTOMY  1990's  . NASAL SINUS SURGERY  1980's  . POSTERIOR LUMBAR FUSION  05/2013   L4-5; S1  . POSTERIOR LUMBAR FUSION  11/04/2014   L3-4  . SHOULDER ARTHROSCOPY W/ ROTATOR CUFF REPAIR Right 2012  . TUBAL LIGATION  1992    There were no vitals filed for this visit.   Subjective Assessment - 08/09/20 1241    Subjective I have questions about my exercises.    Currently in Pain? Yes    Pain Score 4     Pain Location Back    Pain Orientation  Right;Lower;Left    Pain Descriptors / Indicators Sharp;Tiring    Pain Type Chronic pain    Pain Radiating Towards anterior Rt thigh    Pain Onset More than a month ago    Pain Frequency Intermittent    Aggravating Factors  standing and walking    Pain Relieving Factors sitting                             [facility] Adult PT Treatment/Exercise - 08/09/20 0001      Lumbar Exercises: Stretches   Figure 4 Stretch 20 seconds;3 reps    Figure 4 Stretch Limitations seated     Other Lumbar Stretch Exercise supine hooklying hip IR/ER x10 reps       Lumbar Exercises: Seated   Sit to Stand 20 reps    Sit to Stand Limitations core activation       Lumbar Exercises: Supine   Pelvic Tilt 10 reps;5 seconds    Bridge  10 reps    Bridge Limitations verbal cues for vertical motion vs horizontal    Other Supine Lumbar Exercises straight leg raise x10 reps each with abdominal bracing       Lumbar Exercises: Sidelying   Clam Both;10 reps    Clam Limitations abdominal bracing                   PT Education - 08/09/20 1304    Education Details DN info    Person(s) Educated Patient    Methods Explanation;Demonstration;Handout    Comprehension Verbalized understanding;Returned demonstration            PT Short Term Goals - 07/28/20 1212      PT SHORT TERM GOAL #1   Title Ind with initial HEP    Time 2    Period Weeks    Status New    Target Date 08/11/20             PT Long Term Goals - 07/28/20 1212      PT LONG TERM GOAL #1   Title Patient to report decreased pain in back and hips by 70% or more with standing.    Time 8    Period Weeks    Status New    Target Date 09/22/20      PT LONG TERM GOAL #2   Title Patient able to safely amb community distances without AD    Time 8    Period Weeks    Status New    Target Date 09/22/20      PT LONG TERM GOAL #3   Title Improved Bil hip strength to >= 4+/5 to improve overall function    Time 8    Period Weeks    Status New      PT LONG TERM GOAL #4   Title Improved 5x sit to stand to <= 11 sec to reduce fall risk    Baseline 13.46 sec    Time 8    Period Weeks    Status New      PT LONG TERM GOAL #5   Title Improved FOTO to <= 44% limitations    Baseline 56% limtations                 Plan - 08/09/20 1302    Clinical Impression Statement She had some questions regarding her technique with the HEP again today. PT reviewed HEP and provided minor modifications and tactile  cues for technique. Pt with improved technique with pelvic tilts with reduced gluteal activation.  PT reduced SLR to 5 reps each due to quad lag after 5 reps and pt will increase reps as able.  PT discussed dry needling with Pt and provided  information.  Pt will think about this.  Pt will continue to benefit from skilled PT to address core and hip weakness and LBP/Rt LE pain.    Comorbidities Lupus, CTS right hand and thumb surgery, hypothyroid, lumbar fusion January 2021 L1/L2 (fused to L5/S1), Rt RCR 2013, OA, HTN, neuropathy    Rehab Potential Good    PT Frequency 2x / week    PT Duration 8 weeks    PT Treatment/Interventions ADLs/Self Care Home Management;Aquatic Therapy;Cryotherapy;Electrical Stimulation;Moist Heat;Neuromuscular re-education;Balance training;Therapeutic exercise;Therapeutic activities;Gait training;Patient/family education;Manual techniques;Dry needling    PT Next Visit Plan progress core and hip strengthening; gait/balance.  Dry needling if pt agrees    PT Home Exercise Plan [email or web address]    Recommended Other Services initial cert is signed    Consulted and Agree with Plan of Care Patient           Patient will benefit from skilled therapeutic intervention in order to improve the following deficits and impairments:  Abnormal gait, Decreased range of motion, Pain, Increased muscle spasms, Impaired flexibility, Decreased balance, Decreased strength, Postural dysfunction  Visit Diagnosis: Chronic bilateral low back pain, unspecified whether sciatica present  Pain in right hip  Muscle weakness (generalized)  Unsteadiness on feet     Problem List Patient Active Problem List   Diagnosis Date Noted  . Other spondylosis with radiculopathy, lumbar region 12/09/2019  . Secondary adrenal insufficiency ([place]) 04/03/2019  . Metabolic acidosis, normal anion gap (NAG) 04/02/2019  . HTN (hypertension)   . Adrenal insufficiency ([place]) 04/01/2019  . Hyponatremia 04/01/2019  . Hypothyroidism 04/01/2019  . AKI (acute kidney injury) ([place]) 04/01/2019  . Lumbar stenosis with neurogenic claudication 02/19/2017  . Migraine with aura and without status migrainosus, not intractable 07/11/2016  . Spondylolisthesis of  lumbar region 11/04/2014  . Postconcussional syndrome 09/30/2013  . Post-concussion headache 09/30/2013  . Esophageal reflux 10/11/2011  . Benign neoplasm of colon 10/11/2011  . Gastritis, chronic 10/11/2011  . Guaiac + stool 09/28/2011  . IBS (irritable bowel syndrome) 09/28/2011  . GERD (gastroesophageal reflux disease) 09/28/2011  . Migraine syndrome 09/28/2011     Sigurd Sos, PT 08/09/20 1:18 PM  [facility] [address] [place], [place], [address] Phone: [phone]   Fax:  [phone]  Name: CALI HOPE MRN: [id number] Date of Birth: 06/09/1956

## 2020-08-11 ENCOUNTER — Other Ambulatory Visit: Payer: Self-pay

## 2020-08-11 ENCOUNTER — Ambulatory Visit: Payer: 59

## 2020-08-11 DIAGNOSIS — R2681 Unsteadiness on feet: Secondary | ICD-10-CM

## 2020-08-11 DIAGNOSIS — G8929 Other chronic pain: Secondary | ICD-10-CM

## 2020-08-11 DIAGNOSIS — M6281 Muscle weakness (generalized): Secondary | ICD-10-CM

## 2020-08-11 DIAGNOSIS — M25551 Pain in right hip: Secondary | ICD-10-CM

## 2020-08-11 DIAGNOSIS — M545 Low back pain: Secondary | ICD-10-CM | POA: Diagnosis not present

## 2020-08-11 NOTE — Therapy (Signed)
[facility] [address], [place], [place], [address] Phone: [phone]   Fax:  [phone]  Physical Therapy Treatment  Patient Details  Name: Crystal Larsen MRN: [id number] Date of Birth: 03-Jan-1956 Referring Provider (PT): Jonathon Jordan MD   Encounter Date: 08/11/2020   PT End of Session - 08/11/20 1313    Visit Number 4    Date for PT Re-Evaluation 09/22/20    PT Start Time 4403    PT Stop Time 1314    PT Time Calculation (min) 40 min    Activity Tolerance Patient tolerated treatment well;No increased pain    Behavior During Therapy WFL for tasks assessed/performed           Past Medical History:  Diagnosis Date   Acute kidney injury ([place])    Adrenal insufficiency ([place])    Anemia    Anxiety    Arthritis    "left foot; left hand; right shoulder; back" (11/04/2014)   Elevated liver enzymes    Gallstones    GERD (gastroesophageal reflux disease)    History of gout    HTN (hypertension)    Hypothyroidism    IBS (irritable bowel syndrome)    ?   Lupus ([place]) 47/4259   Metabolic acidosis    Migraine    "maybe once/yr" (11/04/2014)   Neuropathy    right leg   Obesity    Pancreatitis    Pancreatitis    PONV (postoperative nausea and vomiting) 1981 X 1   only time   Seizures ([place]) 2003    due to tramadol reaction -- takes depakote for migraine prevention   Sleep apnea    does not use cpap   Spondylolisthesis of lumbar region    Unsteady gait    Wears glasses     Past Surgical History:  Procedure Laterality Date   ANKLE GANGLION CYST EXCISION Right 1980's   ANTERIOR LAT LUMBAR FUSION N/A 12/09/2019   Procedure: Lumbar One-Two Anterolateral lumbar decompression/interbody fusion with lateral plate;  Surgeon: Kristeen Miss, MD;  Location: [place];  Service: Neurosurgery;  Laterality: N/A;  Lumbar One-Two Anterolateral lumbar decompression/interbody fusion with lateral  plate   BACK SURGERY     BREAST SURGERY     biopsy   BUNIONECTOMY Left 2012   CARPAL TUNNEL RELEASE Right 05/22/2019   Procedure: RIGHT CARPAL TUNNEL RELEASE;  Surgeon: Daryll Brod, MD;  Location: [place];  Service: Orthopedics;  Laterality: Right;   CARPOMETACARPEL SUSPENSION PLASTY Right 05/22/2019   Procedure: ARTHROPLASY CARPOMETACARPAL RIGHT THUMB TRAPEZIUM EXCISION TENDON TRANSFER MICRO LINK SUSPENSION;  Surgeon: Daryll Brod, MD;  Location: [place];  Service: Orthopedics;  Laterality: Right;   CESAREAN SECTION  1986   COLONOSCOPY W/ BIOPSIES AND POLYPECTOMY     EYE SURGERY Bilateral 2012   "lasered a hole in my iris cause pressure was building up"   LAPAROSCOPIC CHOLECYSTECTOMY  1990's   NASAL SINUS SURGERY  1980's   POSTERIOR LUMBAR FUSION  05/2013   L4-5; S1   POSTERIOR LUMBAR FUSION  11/04/2014   L3-4   SHOULDER ARTHROSCOPY W/ ROTATOR CUFF REPAIR Right 2012   TUBAL LIGATION  1992    There were no vitals filed for this visit.   Subjective Assessment - 08/11/20 1238    Subjective I was hurting after last session.  I think it was sit to stand.    Patient Stated Goals not have pain and weakness and be able to walk without  a cane    Currently in Pain? Yes    Pain Score 4    it was 6-7/10 after last session   Pain Location Back                             [facility] Adult PT Treatment/Exercise - 08/11/20 0001      Lumbar Exercises: Aerobic   Nustep Level 1x 6 minutes       Lumbar Exercises: Standing   Other Standing Lumbar Exercises hip flexor stretch on step 3x20 seconds on Rt      Manual Therapy   Manual Therapy Soft tissue mobilization;Myofascial release    Manual therapy comments bil lumbar paraspinals and gluteals             Trigger Point Dry Needling - 08/11/20 0001    Consent Given? Yes    Education Handout Provided Previously provided    Muscles Treated Back/Hip Lumbar multifidi;Gluteus  medius;Gluteus minimus;Piriformis    Gluteus Minimus Response Twitch response elicited;Palpable increased muscle length    Gluteus Medius Response Twitch response elicited;Palpable increased muscle length    Piriformis Response Twitch response elicited;Palpable increased muscle length    Lumbar multifidi Response Twitch response elicited;Palpable increased muscle length                PT Education - 08/11/20 1312    Education Details Access Code: [email or web address]    Person(s) Educated Patient    Methods Explanation;Demonstration;Handout    Comprehension Verbalized understanding;Returned demonstration            PT Short Term Goals - 07/28/20 1212      PT SHORT TERM GOAL #1   Title Ind with initial HEP    Time 2    Period Weeks    Status New    Target Date 08/11/20             PT Long Term Goals - 07/28/20 1212      PT LONG TERM GOAL #1   Title Patient to report decreased pain in back and hips by 70% or more with standing.    Time 8    Period Weeks    Status New    Target Date 09/22/20      PT LONG TERM GOAL #2   Title Patient able to safely amb community distances without AD    Time 8    Period Weeks    Status New    Target Date 09/22/20      PT LONG TERM GOAL #3   Title Improved Bil hip strength to >= 4+/5 to improve overall function    Time 8    Period Weeks    Status New      PT LONG TERM GOAL #4   Title Improved 5x sit to stand to <= 11 sec to reduce fall risk    Baseline 13.46 sec    Time 8    Period Weeks    Status New      PT LONG TERM GOAL #5   Title Improved FOTO to <= 44% limitations    Baseline 56% limtations                 Plan - 08/11/20 1326    Clinical Impression Statement Pt had some increased pain after last session and this has now reduced back to baseline.  PT discussed some adjustments to HEP with fewer reps and reduced frequency.  PT added hip flexor stretch to address Rt hip flexor pain.  Pt with tension and trigger  points in bil gluteals and lumbar paraspinals and demonstrated improved tissue mobility after manual therapy and dry needling today.  Pt will continue to benefit from skilled PT to address balance, LE strength, flexibility and pain.    PT Frequency 2x / week    PT Duration 8 weeks    PT Treatment/Interventions ADLs/Self Care Home Management;Aquatic Therapy;Cryotherapy;Electrical Stimulation;Moist Heat;Neuromuscular re-education;Balance training;Therapeutic exercise;Therapeutic activities;Gait training;Patient/family education;Manual techniques;Dry needling    PT Next Visit Plan progress core and hip strengthening; gait/balance.  assess response to dry needling    PT Home Exercise Plan [email or web address]    Consulted and Agree with Plan of Care Patient           Patient will benefit from skilled therapeutic intervention in order to improve the following deficits and impairments:  Abnormal gait, Decreased range of motion, Pain, Increased muscle spasms, Impaired flexibility, Decreased balance, Decreased strength, Postural dysfunction  Visit Diagnosis: Chronic bilateral low back pain, unspecified whether sciatica present  Pain in right hip  Muscle weakness (generalized)  Unsteadiness on feet     Problem List Patient Active Problem List   Diagnosis Date Noted   Other spondylosis with radiculopathy, lumbar region 12/09/2019   Secondary adrenal insufficiency ([place]) 53/61/4431   Metabolic acidosis, normal anion gap (NAG) 04/02/2019   HTN (hypertension)    Adrenal insufficiency ([place]) 04/01/2019   Hyponatremia 04/01/2019   Hypothyroidism 04/01/2019   AKI (acute kidney injury) ([place]) 04/01/2019   Lumbar stenosis with neurogenic claudication 02/19/2017   Migraine with aura and without status migrainosus, not intractable 07/11/2016   Spondylolisthesis of lumbar region 11/04/2014   Postconcussional syndrome 09/30/2013   Post-concussion headache 09/30/2013   Esophageal reflux 10/11/2011     Benign neoplasm of colon 10/11/2011   Gastritis, chronic 10/11/2011   Guaiac + stool 09/28/2011   IBS (irritable bowel syndrome) 09/28/2011   GERD (gastroesophageal reflux disease) 09/28/2011   Migraine syndrome 09/28/2011    Sigurd Sos, PT 08/11/20 1:28 PM  [facility] [address], [place], [place], [address] Phone: [phone]   Fax:  [phone]  Name: Crystal Larsen MRN: [id number] Date of Birth: 1956-02-05

## 2020-08-11 NOTE — Patient Instructions (Signed)
Access Code: [email or web address] URL: [email or web address] Date: 08/11/2020 Prepared by: Claiborne Billings  Exercises  Hip Flexor Stretch on Step - 2 x daily - 7 x weekly - 1 sets - 3 reps - 20 hold

## 2020-08-16 ENCOUNTER — Ambulatory Visit: Payer: 59

## 2020-08-16 ENCOUNTER — Other Ambulatory Visit: Payer: Self-pay

## 2020-08-16 DIAGNOSIS — M25551 Pain in right hip: Secondary | ICD-10-CM

## 2020-08-16 DIAGNOSIS — M545 Low back pain, unspecified: Secondary | ICD-10-CM

## 2020-08-16 DIAGNOSIS — M6281 Muscle weakness (generalized): Secondary | ICD-10-CM

## 2020-08-16 NOTE — Therapy (Signed)
[facility] [address], [place], [place], [address] Phone: [phone]   Fax:  [phone]  Physical Therapy Treatment  Patient Details  Name: Crystal Larsen MRN: [id number] Date of Birth: May 05, 1956 Referring Provider (PT): Jonathon Jordan MD   Encounter Date: 08/16/2020   PT End of Session - 08/16/20 1314    Visit Number 5    Date for PT Re-Evaluation 09/22/20    PT Start Time 1231    PT Stop Time 1316    PT Time Calculation (min) 45 min    Activity Tolerance Patient tolerated treatment well;No increased pain    Behavior During Therapy WFL for tasks assessed/performed           Past Medical History:  Diagnosis Date  . Acute kidney injury ([place])   . Adrenal insufficiency ([place])   . Anemia   . Anxiety   . Arthritis    "left foot; left hand; right shoulder; back" (11/04/2014)  . Elevated liver enzymes   . Gallstones   . GERD (gastroesophageal reflux disease)   . History of gout   . HTN (hypertension)   . Hypothyroidism   . IBS (irritable bowel syndrome)    ?  [name] Lupus ([place]) 07/2019  . Metabolic acidosis   . Migraine    "maybe once/yr" (11/04/2014)  . Neuropathy    right leg  . Obesity   . Pancreatitis   . Pancreatitis   . PONV (postoperative nausea and vomiting) 1981 X 1   only time  . Seizures ([place]) 2003    due to tramadol reaction -- takes depakote for migraine prevention  . Sleep apnea    does not use cpap  . Spondylolisthesis of lumbar region   . Unsteady gait   . Wears glasses     Past Surgical History:  Procedure Laterality Date  . ANKLE GANGLION CYST EXCISION Right 1980's  . ANTERIOR LAT LUMBAR FUSION N/A 12/09/2019   Procedure: Lumbar One-Two Anterolateral lumbar decompression/interbody fusion with lateral plate;  Surgeon: Kristeen Miss, MD;  Location: [place];  Service: Neurosurgery;  Laterality: N/A;  Lumbar One-Two Anterolateral lumbar decompression/interbody fusion with lateral  plate  . BACK SURGERY    . BREAST SURGERY     biopsy  . BUNIONECTOMY Left 2012  . CARPAL TUNNEL RELEASE Right 05/22/2019   Procedure: RIGHT CARPAL TUNNEL RELEASE;  Surgeon: Daryll Brod, MD;  Location: [place];  Service: Orthopedics;  Laterality: Right;  . CARPOMETACARPEL SUSPENSION PLASTY Right 05/22/2019   Procedure: ARTHROPLASY CARPOMETACARPAL RIGHT THUMB TRAPEZIUM EXCISION TENDON TRANSFER MICRO LINK SUSPENSION;  Surgeon: Daryll Brod, MD;  Location: [place];  Service: Orthopedics;  Laterality: Right;  . CESAREAN SECTION  1986  . COLONOSCOPY W/ BIOPSIES AND POLYPECTOMY    . EYE SURGERY Bilateral 2012   "lasered a hole in my iris cause pressure was building up"  . LAPAROSCOPIC CHOLECYSTECTOMY  1990's  . NASAL SINUS SURGERY  1980's  . POSTERIOR LUMBAR FUSION  05/2013   L4-5; S1  . POSTERIOR LUMBAR FUSION  11/04/2014   L3-4  . SHOULDER ARTHROSCOPY W/ ROTATOR CUFF REPAIR Right 2012  . TUBAL LIGATION  1992    There were no vitals filed for this visit.   Subjective Assessment - 08/16/20 1235    Subjective I saw Dr Ellene Route and he started me on Lyrica.  I am scheduled for another injection.  Dry needling helped my tension.    Currently in Pain? Yes  Pain Score 4     Pain Location Back    Pain Orientation Right;Lower    Pain Descriptors / Indicators Sharp;Tiring    Pain Onset More than a month ago    Pain Frequency Intermittent    Aggravating Factors  standing and walking    Pain Relieving Factors sitting                             [facility] Adult PT Treatment/Exercise - 08/16/20 0001      Lumbar Exercises: Stretches   Other Lumbar Stretch Exercise hip flexor stretch in supine- leg off mat 3x20 seconds      Lumbar Exercises: Sidelying   Clam 20 reps    Clam Limitations tactile cues for position and verbal cues for TA activaiton      Manual Therapy   Manual Therapy Soft tissue mobilization;Myofascial release    Manual therapy  comments Rt gluteals and Rt lateral quads            Trigger Point Dry Needling - 08/16/20 0001    Consent Given? Yes    Education Handout Provided Previously provided    Muscles Treated Lower Quadrant Quadriceps   Rt lateral   Muscles Treated Back/Hip Gluteus medius;Gluteus minimus;Gluteus maximus   Rt only   Gluteus Minimus Response Twitch response elicited;Palpable increased muscle length    Gluteus Medius Response Twitch response elicited;Palpable increased muscle length    Piriformis Response Twitch response elicited;Palpable increased muscle length                  PT Short Term Goals - 07/28/20 1212      PT SHORT TERM GOAL #1   Title Ind with initial HEP    Time 2    Period Weeks    Status New    Target Date 08/11/20             PT Long Term Goals - 07/28/20 1212      PT LONG TERM GOAL #1   Title Patient to report decreased pain in back and hips by 70% or more with standing.    Time 8    Period Weeks    Status New    Target Date 09/22/20      PT LONG TERM GOAL #2   Title Patient able to safely amb community distances without AD    Time 8    Period Weeks    Status New    Target Date 09/22/20      PT LONG TERM GOAL #3   Title Improved Bil hip strength to >= 4+/5 to improve overall function    Time 8    Period Weeks    Status New      PT LONG TERM GOAL #4   Title Improved 5x sit to stand to <= 11 sec to reduce fall risk    Baseline 13.46 sec    Time 8    Period Weeks    Status New      PT LONG TERM GOAL #5   Title Improved FOTO to <= 44% limitations    Baseline 56% limtations                 Plan - 08/16/20 1246    Clinical Impression Statement Pt felt good after dry needling last session. PT added hip flexor stretch to address Rt hip flexor pain.  Pt with tension and trigger points in bil  gluteals and Rt lateral quads and demonstrated improved tissue mobility after manual therapy and dry needling today.  Pt demonstrated  instability Rt>Lt with sidelying clams and was challenged with TA endurance with this.  Pt will continue to benefit from skilled PT to address balance, LE strength, flexibility and pain.    Rehab Potential Good    PT Frequency 2x / week    PT Duration 8 weeks    PT Treatment/Interventions ADLs/Self Care Home Management;Aquatic Therapy;Cryotherapy;Electrical Stimulation;Moist Heat;Neuromuscular re-education;Balance training;Therapeutic exercise;Therapeutic activities;Gait training;Patient/family education;Manual techniques;Dry needling    PT Next Visit Plan progress core and hip strengthening; gait/balance.  assess response to dry needling    PT Home Exercise Plan [email or web address]    Consulted and Agree with Plan of Care Patient           Patient will benefit from skilled therapeutic intervention in order to improve the following deficits and impairments:  Abnormal gait, Decreased range of motion, Pain, Increased muscle spasms, Impaired flexibility, Decreased balance, Decreased strength, Postural dysfunction  Visit Diagnosis: Chronic bilateral low back pain, unspecified whether sciatica present  Pain in right hip  Muscle weakness (generalized)     Problem List Patient Active Problem List   Diagnosis Date Noted  . Other spondylosis with radiculopathy, lumbar region 12/09/2019  . Secondary adrenal insufficiency ([place]) 04/03/2019  . Metabolic acidosis, normal anion gap (NAG) 04/02/2019  . HTN (hypertension)   . Adrenal insufficiency ([place]) 04/01/2019  . Hyponatremia 04/01/2019  . Hypothyroidism 04/01/2019  . AKI (acute kidney injury) ([place]) 04/01/2019  . Lumbar stenosis with neurogenic claudication 02/19/2017  . Migraine with aura and without status migrainosus, not intractable 07/11/2016  . Spondylolisthesis of lumbar region 11/04/2014  . Postconcussional syndrome 09/30/2013  . Post-concussion headache 09/30/2013  . Esophageal reflux 10/11/2011  . Benign neoplasm of colon 10/11/2011  .  Gastritis, chronic 10/11/2011  . Guaiac + stool 09/28/2011  . IBS (irritable bowel syndrome) 09/28/2011  . GERD (gastroesophageal reflux disease) 09/28/2011  . Migraine syndrome 09/28/2011    Sigurd Sos, PT 08/16/20 1:20 PM  [facility] [address], [place], [place], [address] Phone: [phone]   Fax:  [phone]  Name: Crystal Larsen MRN: [id number] Date of Birth: 06/29/1956

## 2020-08-18 ENCOUNTER — Ambulatory Visit: Payer: 59

## 2020-08-18 ENCOUNTER — Other Ambulatory Visit: Payer: Self-pay

## 2020-08-18 DIAGNOSIS — M25551 Pain in right hip: Secondary | ICD-10-CM

## 2020-08-18 DIAGNOSIS — M545 Low back pain: Secondary | ICD-10-CM | POA: Diagnosis not present

## 2020-08-18 DIAGNOSIS — M6281 Muscle weakness (generalized): Secondary | ICD-10-CM

## 2020-08-18 DIAGNOSIS — R2681 Unsteadiness on feet: Secondary | ICD-10-CM

## 2020-08-18 DIAGNOSIS — G8929 Other chronic pain: Secondary | ICD-10-CM

## 2020-08-18 NOTE — Patient Instructions (Signed)
Access Code: [email or web address] URL: [email or web address] Date: 08/18/2020 Prepared by: Claiborne Billings  Exercises  Supine Shoulder Horizontal Abduction with Resistance - 1 x daily - 7 x weekly - 2 sets - 10 reps Supine Hip Adduction Isometric with Ball - 1 x daily - 7 x weekly - 2 sets - 10 reps Modified Thomas Stretch - 1 x daily - 7 x weekly - 1 sets - 3 reps - 20 hold

## 2020-08-18 NOTE — Therapy (Signed)
[facility] [address] [place], [place], [address] Phone: [phone]   Fax:  [phone]  Physical Therapy Treatment  Patient Details  Name: Crystal Larsen MRN: [id number] Date of Birth: January 07, 1956 Referring Provider (PT): Jonathon Jordan MD   Encounter Date: 08/18/2020   PT End of Session - 08/18/20 1314    Visit Number 6    Date for PT Re-Evaluation 09/22/20    PT Start Time 1232    PT Stop Time 1314    PT Time Calculation (min) 42 min    Activity Tolerance Patient tolerated treatment well;No increased pain    Behavior During Therapy WFL for tasks assessed/performed           Past Medical History:  Diagnosis Date  . Acute kidney injury ([place])   . Adrenal insufficiency ([place])   . Anemia   . Anxiety   . Arthritis    "left foot; left hand; right shoulder; back" (11/04/2014)  . Elevated liver enzymes   . Gallstones   . GERD (gastroesophageal reflux disease)   . History of gout   . HTN (hypertension)   . Hypothyroidism   . IBS (irritable bowel syndrome)    ?  [name] Lupus ([place]) 07/2019  . Metabolic acidosis   . Migraine    "maybe once/yr" (11/04/2014)  . Neuropathy    right leg  . Obesity   . Pancreatitis   . Pancreatitis   . PONV (postoperative nausea and vomiting) 1981 X 1   only time  . Seizures ([place]) 2003    due to tramadol reaction -- takes depakote for migraine prevention  . Sleep apnea    does not use cpap  . Spondylolisthesis of lumbar region   . Unsteady gait   . Wears glasses     Past Surgical History:  Procedure Laterality Date  . ANKLE GANGLION CYST EXCISION Right 1980's  . ANTERIOR LAT LUMBAR FUSION N/A 12/09/2019   Procedure: Lumbar One-Two Anterolateral lumbar decompression/interbody fusion with lateral plate;  Surgeon: Kristeen Miss, MD;  Location: [place];  Service: Neurosurgery;  Laterality: N/A;  Lumbar One-Two Anterolateral lumbar decompression/interbody fusion with lateral  plate  . BACK SURGERY    . BREAST SURGERY     biopsy  . BUNIONECTOMY Left 2012  . CARPAL TUNNEL RELEASE Right 05/22/2019   Procedure: RIGHT CARPAL TUNNEL RELEASE;  Surgeon: Daryll Brod, MD;  Location: [facility];  Service: Orthopedics;  Laterality: Right;  . CARPOMETACARPEL SUSPENSION PLASTY Right 05/22/2019   Procedure: ARTHROPLASY CARPOMETACARPAL RIGHT THUMB TRAPEZIUM EXCISION TENDON TRANSFER MICRO LINK SUSPENSION;  Surgeon: Daryll Brod, MD;  Location: [place];  Service: Orthopedics;  Laterality: Right;  . CESAREAN SECTION  1986  . COLONOSCOPY W/ BIOPSIES AND POLYPECTOMY    . EYE SURGERY Bilateral 2012   "lasered a hole in my iris cause pressure was building up"  . LAPAROSCOPIC CHOLECYSTECTOMY  1990's  . NASAL SINUS SURGERY  1980's  . POSTERIOR LUMBAR FUSION  05/2013   L4-5; S1  . POSTERIOR LUMBAR FUSION  11/04/2014   L3-4  . SHOULDER ARTHROSCOPY W/ ROTATOR CUFF REPAIR Right 2012  . TUBAL LIGATION  1992    There were no vitals filed for this visit.   Subjective Assessment - 08/18/20 1231    Subjective The stretch where I am on the edge of the bed is helping my Rt hip.    Pertinent History Lupus, CTS right hand and thumb surgery, hypothyroid, lumbar fusion  January 2021 L1/L2 (fused to L5/S1), Rt RCR 2013, OA, HTN, neuropathy    Currently in Pain? Yes    Pain Score 4     Pain Location Back    Pain Orientation Right;Lower    Pain Descriptors / Indicators Sharp    Pain Type Chronic pain    Pain Onset More than a month ago    Pain Frequency Intermittent    Aggravating Factors  standing and walking    Pain Relieving Factors sitting                             [facility] Adult PT Treatment/Exercise - 08/18/20 0001      Lumbar Exercises: Aerobic   Nustep Level 1x 6 minutes       Lumbar Exercises: Seated   Sit to Stand 10 reps    Sit to Stand Limitations 2x5 on black pad- with core activation    Other Seated Lumbar Exercises ball  roll outs: forward and bil diagonal 10" holdx 5 each      Lumbar Exercises: Supine   Ab Set 10 reps    AB Set Limitations ball squeeze 5" hold                   PT Education - 08/18/20 1258    Education Details Access Code: [email or web address]    Person(s) Educated Patient    Methods Explanation;Demonstration;Handout    Comprehension Verbalized understanding;Returned demonstration            PT Short Term Goals - 07/28/20 1212      PT SHORT TERM GOAL #1   Title Ind with initial HEP    Time 2    Period Weeks    Status New    Target Date 08/11/20             PT Long Term Goals - 07/28/20 1212      PT LONG TERM GOAL #1   Title Patient to report decreased pain in back and hips by 70% or more with standing.    Time 8    Period Weeks    Status New    Target Date 09/22/20      PT LONG TERM GOAL #2   Title Patient able to safely amb community distances without AD    Time 8    Period Weeks    Status New    Target Date 09/22/20      PT LONG TERM GOAL #3   Title Improved Bil hip strength to >= 4+/5 to improve overall function    Time 8    Period Weeks    Status New      PT LONG TERM GOAL #4   Title Improved 5x sit to stand to <= 11 sec to reduce fall risk    Baseline 13.46 sec    Time 8    Period Weeks    Status New      PT LONG TERM GOAL #5   Title Improved FOTO to <= 44% limitations    Baseline 56% limtations                 Plan - 08/18/20 1244    Clinical Impression Statement Pt felt good after dry needling last session. PT added hip flexor stretch to address Rt hip flexor pain and this helped.  Pt will see her gyn doctor to investigate Rt lower abdominal pain.   Session focused  on functional movements with core activation.  Pt is challenged with core endurance and experiences pain when fatigued. Pt requires verbal and tactile cues for technique with exercise.   Pt will continue to benefit from skilled PT to address balance, LE strength, flexibility  and pain.    Comorbidities Lupus, CTS right hand and thumb surgery, hypothyroid, lumbar fusion January 2021 L1/L2 (fused to L5/S1), Rt RCR 2013, OA, HTN, neuropathy    PT Frequency 2x / week    PT Duration 8 weeks    PT Treatment/Interventions ADLs/Self Care Home Management;Aquatic Therapy;Cryotherapy;Electrical Stimulation;Moist Heat;Neuromuscular re-education;Balance training;Therapeutic exercise;Therapeutic activities;Gait training;Patient/family education;Manual techniques;Dry needling    PT Next Visit Plan progress core and hip strengthening; gait/balance.  Dry needling    PT Home Exercise Plan [email or web address]    Consulted and Agree with Plan of Care Patient           Patient will benefit from skilled therapeutic intervention in order to improve the following deficits and impairments:  Abnormal gait, Decreased range of motion, Pain, Increased muscle spasms, Impaired flexibility, Decreased balance, Decreased strength, Postural dysfunction  Visit Diagnosis: Pain in right hip  Chronic bilateral low back pain, unspecified whether sciatica present  Muscle weakness (generalized)  Unsteadiness on feet     Problem List Patient Active Problem List   Diagnosis Date Noted  . Other spondylosis with radiculopathy, lumbar region 12/09/2019  . Secondary adrenal insufficiency ([place]) 04/03/2019  . Metabolic acidosis, normal anion gap (NAG) 04/02/2019  . HTN (hypertension)   . Adrenal insufficiency ([place]) 04/01/2019  . Hyponatremia 04/01/2019  . Hypothyroidism 04/01/2019  . AKI (acute kidney injury) ([place]) 04/01/2019  . Lumbar stenosis with neurogenic claudication 02/19/2017  . Migraine with aura and without status migrainosus, not intractable 07/11/2016  . Spondylolisthesis of lumbar region 11/04/2014  . Postconcussional syndrome 09/30/2013  . Post-concussion headache 09/30/2013  . Esophageal reflux 10/11/2011  . Benign neoplasm of colon 10/11/2011  . Gastritis, chronic 10/11/2011  . Guaiac +  stool 09/28/2011  . IBS (irritable bowel syndrome) 09/28/2011  . GERD (gastroesophageal reflux disease) 09/28/2011  . Migraine syndrome 09/28/2011    Graham Hyun 08/18/2020, 1:15 PM  [facility] [address], [place], [place], [address] Phone: [phone]   Fax:  [phone]  Name: Crystal Larsen MRN: [id number] Date of Birth: 1956-07-12

## 2020-08-23 ENCOUNTER — Ambulatory Visit: Payer: 59 | Admitting: Physical Therapy

## 2020-08-23 ENCOUNTER — Other Ambulatory Visit (HOSPITAL_COMMUNITY): Payer: Self-pay | Admitting: Nephrology

## 2020-08-23 ENCOUNTER — Other Ambulatory Visit: Payer: Self-pay

## 2020-08-23 ENCOUNTER — Encounter: Payer: Self-pay | Admitting: Physical Therapy

## 2020-08-23 DIAGNOSIS — M545 Low back pain: Secondary | ICD-10-CM | POA: Diagnosis not present

## 2020-08-23 DIAGNOSIS — N183 Chronic kidney disease, stage 3 unspecified: Secondary | ICD-10-CM

## 2020-08-23 DIAGNOSIS — I129 Hypertensive chronic kidney disease with stage 1 through stage 4 chronic kidney disease, or unspecified chronic kidney disease: Secondary | ICD-10-CM

## 2020-08-23 DIAGNOSIS — M329 Systemic lupus erythematosus, unspecified: Secondary | ICD-10-CM

## 2020-08-23 DIAGNOSIS — R809 Proteinuria, unspecified: Secondary | ICD-10-CM

## 2020-08-23 DIAGNOSIS — G8929 Other chronic pain: Secondary | ICD-10-CM

## 2020-08-23 DIAGNOSIS — R2681 Unsteadiness on feet: Secondary | ICD-10-CM

## 2020-08-23 DIAGNOSIS — E2740 Unspecified adrenocortical insufficiency: Secondary | ICD-10-CM

## 2020-08-23 DIAGNOSIS — M6281 Muscle weakness (generalized): Secondary | ICD-10-CM

## 2020-08-23 DIAGNOSIS — M25551 Pain in right hip: Secondary | ICD-10-CM

## 2020-08-23 NOTE — Therapy (Signed)
[facility] [address] [place], [place], [address] Phone: [phone]   Fax:  [phone]  Physical Therapy Treatment  Patient Details  Name: Crystal Larsen MRN: [id number] Date of Birth: 1956/03/15 Referring Provider (PT): Jonathon Jordan MD   Encounter Date: 08/23/2020   PT End of Session - 08/23/20 1228    Visit Number 7    Date for PT Re-Evaluation 09/22/20    PT Start Time 1228    PT Stop Time 1308    PT Time Calculation (min) 40 min    Activity Tolerance Patient tolerated treatment well;No increased pain    Behavior During Therapy WFL for tasks assessed/performed           Past Medical History:  Diagnosis Date  . Acute kidney injury ([place])   . Adrenal insufficiency ([place])   . Anemia   . Anxiety   . Arthritis    "left foot; left hand; right shoulder; back" (11/04/2014)  . Elevated liver enzymes   . Gallstones   . GERD (gastroesophageal reflux disease)   . History of gout   . HTN (hypertension)   . Hypothyroidism   . IBS (irritable bowel syndrome)    ?  [name] Lupus ([place]) 07/2019  . Metabolic acidosis   . Migraine    "maybe once/yr" (11/04/2014)  . Neuropathy    right leg  . Obesity   . Pancreatitis   . Pancreatitis   . PONV (postoperative nausea and vomiting) 1981 X 1   only time  . Seizures ([place]) 2003    due to tramadol reaction -- takes depakote for migraine prevention  . Sleep apnea    does not use cpap  . Spondylolisthesis of lumbar region   . Unsteady gait   . Wears glasses     Past Surgical History:  Procedure Laterality Date  . ANKLE GANGLION CYST EXCISION Right 1980's  . ANTERIOR LAT LUMBAR FUSION N/A 12/09/2019   Procedure: Lumbar One-Two Anterolateral lumbar decompression/interbody fusion with lateral plate;  Surgeon: Kristeen Miss, MD;  Location: [facility];  Service: Neurosurgery;  Laterality: N/A;  Lumbar One-Two Anterolateral lumbar decompression/interbody fusion with lateral  plate  . BACK SURGERY    . BREAST SURGERY     biopsy  . BUNIONECTOMY Left 2012  . CARPAL TUNNEL RELEASE Right 05/22/2019   Procedure: RIGHT CARPAL TUNNEL RELEASE;  Surgeon: Daryll Brod, MD;  Location: [facility];  Service: Orthopedics;  Laterality: Right;  . CARPOMETACARPEL SUSPENSION PLASTY Right 05/22/2019   Procedure: ARTHROPLASY CARPOMETACARPAL RIGHT THUMB TRAPEZIUM EXCISION TENDON TRANSFER MICRO LINK SUSPENSION;  Surgeon: Daryll Brod, MD;  Location: [facility];  Service: Orthopedics;  Laterality: Right;  . CESAREAN SECTION  1986  . COLONOSCOPY W/ BIOPSIES AND POLYPECTOMY    . EYE SURGERY Bilateral 2012   "lasered a hole in my iris cause pressure was building up"  . LAPAROSCOPIC CHOLECYSTECTOMY  1990's  . NASAL SINUS SURGERY  1980's  . POSTERIOR LUMBAR FUSION  05/2013   L4-5; S1  . POSTERIOR LUMBAR FUSION  11/04/2014   L3-4  . SHOULDER ARTHROSCOPY W/ ROTATOR CUFF REPAIR Right 2012  . TUBAL LIGATION  1992    There were no vitals filed for this visit.   Subjective Assessment - 08/23/20 1229    Subjective I'm hurting today.    Pertinent History Lupus, CTS right hand and thumb surgery, hypothyroid, lumbar fusion January 2021 L1/L2 (fused to L5/S1), Rt RCR 2013, OA, HTN, neuropathy  Currently in Pain? Yes    Pain Score 4     Pain Location Back    Pain Orientation Right;Lower    Pain Descriptors / Indicators Dull;Sore    Aggravating Factors  [place], standing    Pain Relieving Factors sitting                             [facility] Adult PT Treatment/Exercise - 08/23/20 0001      Lumbar Exercises: Aerobic   Nustep L1 x 8 min with PTA present to discuss status      Lumbar Exercises: Standing   Other Standing Lumbar Exercises Hip abduction 10x bil VC for form    added to HEP     Lumbar Exercises: Seated   Sit to Stand --   2x10 with green loop off black mat   Sit to Stand Limitations VC to not snap her RT knee straight      Lumbar  Exercises: Sidelying   Hip Abduction Both;10 reps   bil     Knee/Hip Exercises: Supine   Bridges Strengthening;Both;2 sets;5 reps    Knee Flexion Limitations isometric hip extension  bil 5 sec hold 10x bil    Other Supine Knee/Hip Exercises Ball & buttocks cocontraction 5 sec hold 10x    Other Supine Knee/Hip Exercises Green loop clamshells 10x                    PT Short Term Goals - 07/28/20 1212      PT SHORT TERM GOAL #1   Title Ind with initial HEP    Time 2    Period Weeks    Status New    Target Date 08/11/20             PT Long Term Goals - 07/28/20 1212      PT LONG TERM GOAL #1   Title Patient to report decreased pain in back and hips by 70% or more with standing.    Time 8    Period Weeks    Status New    Target Date 09/22/20      PT LONG TERM GOAL #2   Title Patient able to safely amb community distances without AD    Time 8    Period Weeks    Status New    Target Date 09/22/20      PT LONG TERM GOAL #3   Title Improved Bil hip strength to >= 4+/5 to improve overall function    Time 8    Period Weeks    Status New      PT LONG TERM GOAL #4   Title Improved 5x sit to stand to <= 11 sec to reduce fall risk    Baseline 13.46 sec    Time 8    Period Weeks    Status New      PT LONG TERM GOAL #5   Title Improved FOTO to <= 44% limitations    Baseline 56% limtations                 Plan - 08/23/20 1235    Clinical Impression Statement Pt arrives with same pain in RT ow back but very wobbly gait. treatment focused on hip strength and stabilization emphasizing control and not fatiguing too much. pt did very well this. Sidelying hip abduciton was very difficult but could do standing. Was given for HEP.    Personal  Factors and Comorbidities Comorbidity 3+;Past/Current Experience;Time since onset of injury/illness/exacerbation    Comorbidities Lupus, CTS right hand and thumb surgery, hypothyroid, lumbar fusion January 2021 L1/L2 (fused  to L5/S1), Rt RCR 2013, OA, HTN, neuropathy    Examination-Activity Limitations Locomotion Level;Stand    Examination-Participation Restrictions Community Activity;Volunteer    Stability/Clinical Decision Making Evolving/Moderate complexity    Rehab Potential Good    PT Frequency 2x / week    PT Duration 8 weeks    PT Treatment/Interventions ADLs/Self Care Home Management;Aquatic Therapy;Cryotherapy;Electrical Stimulation;Moist Heat;Neuromuscular re-education;Balance training;Therapeutic exercise;Therapeutic activities;Gait training;Patient/family education;Manual techniques;Dry needling    PT Next Visit Plan progress core and hip strengthening; gait/balance.  Dry needling    PT Home Exercise Plan [email or web address]    Consulted and Agree with Plan of Care Patient           Patient will benefit from skilled therapeutic intervention in order to improve the following deficits and impairments:  Abnormal gait, Decreased range of motion, Pain, Increased muscle spasms, Impaired flexibility, Decreased balance, Decreased strength, Postural dysfunction  Visit Diagnosis: Chronic bilateral low back pain, unspecified whether sciatica present  Pain in right hip  Unsteadiness on feet  Muscle weakness (generalized)     Problem List Patient Active Problem List   Diagnosis Date Noted  . Other spondylosis with radiculopathy, lumbar region 12/09/2019  . Secondary adrenal insufficiency ([place]) 04/03/2019  . Metabolic acidosis, normal anion gap (NAG) 04/02/2019  . HTN (hypertension)   . Adrenal insufficiency ([place]) 04/01/2019  . Hyponatremia 04/01/2019  . Hypothyroidism 04/01/2019  . AKI (acute kidney injury) ([place]) 04/01/2019  . Lumbar stenosis with neurogenic claudication 02/19/2017  . Migraine with aura and without status migrainosus, not intractable 07/11/2016  . Spondylolisthesis of lumbar region 11/04/2014  . Postconcussional syndrome 09/30/2013  . Post-concussion headache 09/30/2013  . Esophageal  reflux 10/11/2011  . Benign neoplasm of colon 10/11/2011  . Gastritis, chronic 10/11/2011  . Guaiac + stool 09/28/2011  . IBS (irritable bowel syndrome) 09/28/2011  . GERD (gastroesophageal reflux disease) 09/28/2011  . Migraine syndrome 09/28/2011    Laure Leone, PTA 08/23/2020, 1:10 PM  [facility] [address], [place], [place], [address] Phone: [phone]   Fax:  [phone]  Name: Crystal Larsen MRN: [id number] Date of Birth: 1956/09/08

## 2020-08-25 ENCOUNTER — Ambulatory Visit: Payer: 59

## 2020-08-25 ENCOUNTER — Other Ambulatory Visit: Payer: Self-pay

## 2020-08-25 DIAGNOSIS — M25551 Pain in right hip: Secondary | ICD-10-CM

## 2020-08-25 DIAGNOSIS — M6281 Muscle weakness (generalized): Secondary | ICD-10-CM

## 2020-08-25 DIAGNOSIS — G8929 Other chronic pain: Secondary | ICD-10-CM

## 2020-08-25 DIAGNOSIS — R2681 Unsteadiness on feet: Secondary | ICD-10-CM

## 2020-08-25 DIAGNOSIS — M545 Low back pain: Secondary | ICD-10-CM | POA: Diagnosis not present

## 2020-08-25 NOTE — Therapy (Signed)
[facility] [address] [place], [place], [address] Phone: [phone]   Fax:  [phone]  Physical Therapy Treatment  Patient Details  Name: Crystal Larsen MRN: [id number] Date of Birth: 06/13/1956 Referring Provider (PT): Jonathon Jordan MD   Encounter Date: 08/25/2020   PT End of Session - 08/25/20 1316    Visit Number 8    Date for PT Re-Evaluation 09/22/20    PT Start Time 1231    PT Stop Time 1314    PT Time Calculation (min) 43 min    Activity Tolerance Patient tolerated treatment well;No increased pain    Behavior During Therapy WFL for tasks assessed/performed           Past Medical History:  Diagnosis Date  . Acute kidney injury ([place])   . Adrenal insufficiency ([place])   . Anemia   . Anxiety   . Arthritis    "left foot; left hand; right shoulder; back" (11/04/2014)  . Elevated liver enzymes   . Gallstones   . GERD (gastroesophageal reflux disease)   . History of gout   . HTN (hypertension)   . Hypothyroidism   . IBS (irritable bowel syndrome)    ?  [name] Lupus ([place]) 07/2019  . Metabolic acidosis   . Migraine    "maybe once/yr" (11/04/2014)  . Neuropathy    right leg  . Obesity   . Pancreatitis   . Pancreatitis   . PONV (postoperative nausea and vomiting) 1981 X 1   only time  . Seizures ([place]) 2003    due to tramadol reaction -- takes depakote for migraine prevention  . Sleep apnea    does not use cpap  . Spondylolisthesis of lumbar region   . Unsteady gait   . Wears glasses     Past Surgical History:  Procedure Laterality Date  . ANKLE GANGLION CYST EXCISION Right 1980's  . ANTERIOR LAT LUMBAR FUSION N/A 12/09/2019   Procedure: Lumbar One-Two Anterolateral lumbar decompression/interbody fusion with lateral plate;  Surgeon: Kristeen Miss, MD;  Location: [place];  Service: Neurosurgery;  Laterality: N/A;  Lumbar One-Two Anterolateral lumbar decompression/interbody fusion with lateral  plate  . BACK SURGERY    . BREAST SURGERY     biopsy  . BUNIONECTOMY Left 2012  . CARPAL TUNNEL RELEASE Right 05/22/2019   Procedure: RIGHT CARPAL TUNNEL RELEASE;  Surgeon: Daryll Brod, MD;  Location: [place];  Service: Orthopedics;  Laterality: Right;  . CARPOMETACARPEL SUSPENSION PLASTY Right 05/22/2019   Procedure: ARTHROPLASY CARPOMETACARPAL RIGHT THUMB TRAPEZIUM EXCISION TENDON TRANSFER MICRO LINK SUSPENSION;  Surgeon: Daryll Brod, MD;  Location: [place];  Service: Orthopedics;  Laterality: Right;  . CESAREAN SECTION  1986  . COLONOSCOPY W/ BIOPSIES AND POLYPECTOMY    . EYE SURGERY Bilateral 2012   "lasered a hole in my iris cause pressure was building up"  . LAPAROSCOPIC CHOLECYSTECTOMY  1990's  . NASAL SINUS SURGERY  1980's  . POSTERIOR LUMBAR FUSION  05/2013   L4-5; S1  . POSTERIOR LUMBAR FUSION  11/04/2014   L3-4  . SHOULDER ARTHROSCOPY W/ ROTATOR CUFF REPAIR Right 2012  . TUBAL LIGATION  1992    There were no vitals filed for this visit.   Subjective Assessment - 08/25/20 1237    Subjective My Rt hip flexor is really sore today.  I saw my Gyn doctor and they think the pain is musculoskeltal and I will have an ultrasound next week.  Pertinent History Lupus, CTS right hand and thumb surgery, hypothyroid, lumbar fusion January 2021 L1/L2 (fused to L5/S1), Rt RCR 2013, OA, HTN, neuropathy    Currently in Pain? Yes    Pain Score 6    LBP is 2/10   Pain Location Groin    Pain Orientation Right;Lower    Pain Descriptors / Indicators Dull;Sore    Pain Type Chronic pain    Pain Onset More than a month ago    Pain Frequency Intermittent    Aggravating Factors  walking, standing    Pain Relieving Factors sitting, stretching                             [facility] Adult PT Treatment/Exercise - 08/25/20 0001      Lumbar Exercises: Stretches   Hip Flexor Stretch Right;2 reps;30 seconds    Hip Flexor Stretch Limitations  overpressure by PT    Other Lumbar Stretch Exercise butterfly stretch in supine 3x20 seconds      Lumbar Exercises: Aerobic   Nustep L2 x 8 min with PTA present to discuss status      Lumbar Exercises: Seated   Sit to Stand 15 reps      Lumbar Exercises: Supine   Ab Set 10 reps    Clam 20 reps    Clam Limitations green band       Knee/Hip Exercises: Supine   Bridges Strengthening;Both;2 sets;5 reps    Other Supine Knee/Hip Exercises Ball & buttocks cocontraction 5 sec hold 10x                    PT Short Term Goals - 07/28/20 1212      PT SHORT TERM GOAL #1   Title Ind with initial HEP    Time 2    Period Weeks    Status New    Target Date 08/11/20             PT Long Term Goals - 07/28/20 1212      PT LONG TERM GOAL #1   Title Patient to report decreased pain in back and hips by 70% or more with standing.    Time 8    Period Weeks    Status New    Target Date 09/22/20      PT LONG TERM GOAL #2   Title Patient able to safely amb community distances without AD    Time 8    Period Weeks    Status New    Target Date 09/22/20      PT LONG TERM GOAL #3   Title Improved Bil hip strength to >= 4+/5 to improve overall function    Time 8    Period Weeks    Status New      PT LONG TERM GOAL #4   Title Improved 5x sit to stand to <= 11 sec to reduce fall risk    Baseline 13.46 sec    Time 8    Period Weeks    Status New      PT LONG TERM GOAL #5   Title Improved FOTO to <= 44% limitations    Baseline 56% limtations                 Plan - 08/25/20 1305    Clinical Impression Statement Pt with Rt lower abdominal pain that is palpable today.  Pt has had this pain for a couple  of weeks and had her GYN MD assess this and will have ultrasound next week.  No other symptoms associated with this discomfort.  Session focused on flexibility and core strength in neutral.  No aggravation of symptoms with exercise today.  Pt required minor cueing for TA  activation today and demonstrated improved knee alignment with sit to stand.  Pt will have a spinal injection tomorrow.  Pt will continue to benefit from skilled PT to address lumbar stabilization, flexibility and pain.    PT Frequency 2x / week    PT Duration 8 weeks    PT Treatment/Interventions ADLs/Self Care Home Management;Aquatic Therapy;Cryotherapy;Electrical Stimulation;Moist Heat;Neuromuscular re-education;Balance training;Therapeutic exercise;Therapeutic activities;Gait training;Patient/family education;Manual techniques;Dry needling    PT Next Visit Plan progress core and hip strengthening; gait/balance.  Dry needling    PT Home Exercise Plan [email or web address]    Consulted and Agree with Plan of Care Patient           Patient will benefit from skilled therapeutic intervention in order to improve the following deficits and impairments:  Abnormal gait, Decreased range of motion, Pain, Increased muscle spasms, Impaired flexibility, Decreased balance, Decreased strength, Postural dysfunction  Visit Diagnosis: Chronic bilateral low back pain, unspecified whether sciatica present  Pain in right hip  Unsteadiness on feet  Muscle weakness (generalized)     Problem List Patient Active Problem List   Diagnosis Date Noted  . Other spondylosis with radiculopathy, lumbar region 12/09/2019  . Secondary adrenal insufficiency ([place]) 04/03/2019  . Metabolic acidosis, normal anion gap (NAG) 04/02/2019  . HTN (hypertension)   . Adrenal insufficiency ([place]) 04/01/2019  . Hyponatremia 04/01/2019  . Hypothyroidism 04/01/2019  . AKI (acute kidney injury) ([place]) 04/01/2019  . Lumbar stenosis with neurogenic claudication 02/19/2017  . Migraine with aura and without status migrainosus, not intractable 07/11/2016  . Spondylolisthesis of lumbar region 11/04/2014  . Postconcussional syndrome 09/30/2013  . Post-concussion headache 09/30/2013  . Esophageal reflux 10/11/2011  . Benign neoplasm of colon  10/11/2011  . Gastritis, chronic 10/11/2011  . Guaiac + stool 09/28/2011  . IBS (irritable bowel syndrome) 09/28/2011  . GERD (gastroesophageal reflux disease) 09/28/2011  . Migraine syndrome 09/28/2011    Sigurd Sos, PT 08/25/20 1:18 PM  [facility] [address], [place], [place], [address] Phone: [phone]   Fax:  [phone]  Name: Crystal Larsen MRN: [id number] Date of Birth: 1956-05-16

## 2020-08-26 ENCOUNTER — Other Ambulatory Visit: Payer: Self-pay | Admitting: Radiology

## 2020-08-30 ENCOUNTER — Other Ambulatory Visit: Payer: Self-pay | Admitting: Neurology

## 2020-08-30 ENCOUNTER — Other Ambulatory Visit: Payer: Self-pay | Admitting: Student

## 2020-08-30 ENCOUNTER — Ambulatory Visit: Payer: 59 | Attending: Family Medicine

## 2020-08-30 ENCOUNTER — Other Ambulatory Visit: Payer: Self-pay

## 2020-08-30 ENCOUNTER — Other Ambulatory Visit: Payer: Self-pay | Admitting: Physician Assistant

## 2020-08-30 DIAGNOSIS — R2681 Unsteadiness on feet: Secondary | ICD-10-CM | POA: Insufficient documentation

## 2020-08-30 DIAGNOSIS — M545 Low back pain, unspecified: Secondary | ICD-10-CM | POA: Insufficient documentation

## 2020-08-30 DIAGNOSIS — G8929 Other chronic pain: Secondary | ICD-10-CM | POA: Diagnosis present

## 2020-08-30 DIAGNOSIS — M6281 Muscle weakness (generalized): Secondary | ICD-10-CM | POA: Insufficient documentation

## 2020-08-30 DIAGNOSIS — G43909 Migraine, unspecified, not intractable, without status migrainosus: Secondary | ICD-10-CM

## 2020-08-30 DIAGNOSIS — M25551 Pain in right hip: Secondary | ICD-10-CM | POA: Insufficient documentation

## 2020-08-30 NOTE — Therapy (Signed)
[facility] [address] [place], [place], [address] Phone: [phone]   Fax:  [phone]  Physical Therapy Treatment  Patient Details  Name: Crystal Larsen MRN: [id number] Date of Birth: [birth year] Referring Provider (PT): Jonathon Jordan MD   Encounter Date: 08/30/2020   PT End of Session - 08/30/20 1318    Visit Number 9    Date for PT Re-Evaluation 09/22/20    PT Start Time 0932    PT Stop Time 3557    PT Time Calculation (min) 39 min    Activity Tolerance Patient tolerated treatment well;No increased pain    Behavior During Therapy WFL for tasks assessed/performed           Past Medical History:  Diagnosis Date  . Acute kidney injury ([place])   . Adrenal insufficiency ([place])   . Anemia   . Anxiety   . Arthritis    "left foot; left hand; right shoulder; back" (11/04/2014)  . Elevated liver enzymes   . Gallstones   . GERD (gastroesophageal reflux disease)   . History of gout   . HTN (hypertension)   . Hypothyroidism   . IBS (irritable bowel syndrome)    ?  [name] Lupus ([place]) 07/2019  . Metabolic acidosis   . Migraine    "maybe once/yr" (11/04/2014)  . Neuropathy    right leg  . Obesity   . Pancreatitis   . Pancreatitis   . PONV (postoperative nausea and vomiting) 1981 X 1   only time  . Seizures ([place]) 2003    due to tramadol reaction -- takes depakote for migraine prevention  . Sleep apnea    does not use cpap  . Spondylolisthesis of lumbar region   . Unsteady gait   . Wears glasses     Past Surgical History:  Procedure Laterality Date  . ANKLE GANGLION CYST EXCISION Right 1980's  . ANTERIOR LAT LUMBAR FUSION N/A 12/09/2019   Procedure: Lumbar One-Two Anterolateral lumbar decompression/interbody fusion with lateral plate;  Surgeon: Kristeen Miss, MD;  Location: [facility];  Service: Neurosurgery;  Laterality: N/A;  Lumbar One-Two Anterolateral lumbar decompression/interbody fusion with lateral  plate  . BACK SURGERY    . BREAST SURGERY     biopsy  . BUNIONECTOMY Left 2012  . CARPAL TUNNEL RELEASE Right 05/22/2019   Procedure: RIGHT CARPAL TUNNEL RELEASE;  Surgeon: Daryll Brod, MD;  Location: [facility];  Service: Orthopedics;  Laterality: Right;  . CARPOMETACARPEL SUSPENSION PLASTY Right 05/22/2019   Procedure: ARTHROPLASY CARPOMETACARPAL RIGHT THUMB TRAPEZIUM EXCISION TENDON TRANSFER MICRO LINK SUSPENSION;  Surgeon: Daryll Brod, MD;  Location: [place];  Service: Orthopedics;  Laterality: Right;  . CESAREAN SECTION  1986  . COLONOSCOPY W/ BIOPSIES AND POLYPECTOMY    . EYE SURGERY Bilateral 2012   "lasered a hole in my iris cause pressure was building up"  . LAPAROSCOPIC CHOLECYSTECTOMY  1990's  . NASAL SINUS SURGERY  1980's  . POSTERIOR LUMBAR FUSION  05/2013   L4-5; S1  . POSTERIOR LUMBAR FUSION  11/04/2014   L3-4  . SHOULDER ARTHROSCOPY W/ ROTATOR CUFF REPAIR Right 2012  . TUBAL LIGATION  1992    There were no vitals filed for this visit.   Subjective Assessment - 08/30/20 1239    Subjective I had an ultrasound at the Gyn doctor and everything was clear.  MD suspects the pain might be from gas.    Pertinent History Lupus, CTS right hand and  thumb surgery, hypothyroid, lumbar fusion January 2021 L1/L2 (fused to L5/S1), Rt RCR 2013, OA, HTN, neuropathy    Patient Stated Goals not have pain and weakness and be able to walk without a cane    Currently in Pain? No/denies    Pain Score --   back feels tired             [facility] PT Assessment - 08/30/20 0001      Assessment   Medical Diagnosis dorsalgia; pain in uspecified joint; systemic lupus      Transfers   Five time sit to stand comments  9.64 without hands                         [facility] Adult PT Treatment/Exercise - 08/30/20 0001      Exercises   Exercises Knee/Hip      Lumbar Exercises: Stretches   Other Lumbar Stretch Exercise butterfly stretch in supine 3x20  seconds      Lumbar Exercises: Aerobic   Nustep L2 x 8 min with PT present to discuss status      Lumbar Exercises: Seated   Sit to Stand 20 reps    Sit to Stand Limitations good form and alignment      Knee/Hip Exercises: Standing   Hip Abduction Stengthening;Both;2 sets;10 reps    Abduction Limitations standing on 2" step on standing leg with tis     Rebounder weight shifting 3 ways: x1 minute each    Other Standing Knee Exercises alternating step taps 2x10                    PT Short Term Goals - 08/30/20 1241      PT SHORT TERM GOAL #1   Title Ind with initial HEP    Status Achieved             PT Long Term Goals - 08/30/20 1242      PT LONG TERM GOAL #2   Title Patient able to safely amb community distances without AD    Baseline depends on distance    Time 8    Period Weeks    Status On-going      PT LONG TERM GOAL #4   Title Improved 5x sit to stand to <= 11 sec to reduce fall risk    Baseline 9.64 seconds    Status Achieved                 Plan - 08/30/20 1256    Clinical Impression Statement Pt denies any pain today.  Low back just "feels tired".  Pt demonstrates improved technique with sit to stand transition and is able to complete 10 from an elevated surface with good form and control.  5x sit to stand is improved to 9.64 seconds which is greatly improved since last time tested and indicates a reduced falls risk.  Pt remains unsteady for longer distances due to fatigue and uses her cane for stability.  Pt requires tactile cues to stack the hips with sidelying clams.  Pt will continue to benefit from skilled PT to address core and hip strength and improve safety with functional mobility.    PT Frequency 2x / week    PT Duration 8 weeks    PT Treatment/Interventions ADLs/Self Care Home Management;Aquatic Therapy;Cryotherapy;Electrical Stimulation;Moist Heat;Neuromuscular re-education;Balance training;Therapeutic exercise;Therapeutic  activities;Gait training;Patient/family education;Manual techniques;Dry needling    PT Next Visit Plan core and hip strength, dry needling  as needed.    PT Home Exercise Plan [email or web address]    Consulted and Agree with Plan of Care Patient           Patient will benefit from skilled therapeutic intervention in order to improve the following deficits and impairments:  Abnormal gait, Decreased range of motion, Pain, Increased muscle spasms, Impaired flexibility, Decreased balance, Decreased strength, Postural dysfunction  Visit Diagnosis: Pain in right hip  Chronic bilateral low back pain, unspecified whether sciatica present  Unsteadiness on feet  Muscle weakness (generalized)     Problem List Patient Active Problem List   Diagnosis Date Noted  . Other spondylosis with radiculopathy, lumbar region 12/09/2019  . Secondary adrenal insufficiency ([place]) 04/03/2019  . Metabolic acidosis, normal anion gap (NAG) 04/02/2019  . HTN (hypertension)   . Adrenal insufficiency ([place]) 04/01/2019  . Hyponatremia 04/01/2019  . Hypothyroidism 04/01/2019  . AKI (acute kidney injury) ([place]) 04/01/2019  . Lumbar stenosis with neurogenic claudication 02/19/2017  . Migraine with aura and without status migrainosus, not intractable 07/11/2016  . Spondylolisthesis of lumbar region 11/04/2014  . Postconcussional syndrome 09/30/2013  . Post-concussion headache 09/30/2013  . Esophageal reflux 10/11/2011  . Benign neoplasm of colon 10/11/2011  . Gastritis, chronic 10/11/2011  . Guaiac + stool 09/28/2011  . IBS (irritable bowel syndrome) 09/28/2011  . GERD (gastroesophageal reflux disease) 09/28/2011  . Migraine syndrome 09/28/2011     Sigurd Sos, PT 08/30/20 1:20 PM  [facility] [address] [place], [place], [address] Phone: [phone]   Fax:  [phone]  Name: BERNADEAN SALING MRN: [id number] Date of Birth: Aug 20, 1956

## 2020-08-31 ENCOUNTER — Ambulatory Visit (HOSPITAL_COMMUNITY)
Admission: RE | Admit: 2020-08-31 | Discharge: 2020-08-31 | Disposition: A | Discharge status: Home / Self Care | Payer: 59 | Source: Ambulatory Visit | Attending: Nephrology | Admitting: Nephrology

## 2020-08-31 ENCOUNTER — Encounter (HOSPITAL_COMMUNITY): Payer: Self-pay

## 2020-08-31 DIAGNOSIS — M329 Systemic lupus erythematosus, unspecified: Secondary | ICD-10-CM | POA: Diagnosis not present

## 2020-08-31 DIAGNOSIS — E039 Hypothyroidism, unspecified: Secondary | ICD-10-CM | POA: Insufficient documentation

## 2020-08-31 DIAGNOSIS — Z79899 Other long term (current) drug therapy: Secondary | ICD-10-CM | POA: Insufficient documentation

## 2020-08-31 DIAGNOSIS — M4316 Spondylolisthesis, lumbar region: Secondary | ICD-10-CM | POA: Diagnosis not present

## 2020-08-31 DIAGNOSIS — K219 Gastro-esophageal reflux disease without esophagitis: Secondary | ICD-10-CM | POA: Diagnosis not present

## 2020-08-31 DIAGNOSIS — R809 Proteinuria, unspecified: Secondary | ICD-10-CM | POA: Diagnosis present

## 2020-08-31 DIAGNOSIS — I129 Hypertensive chronic kidney disease with stage 1 through stage 4 chronic kidney disease, or unspecified chronic kidney disease: Secondary | ICD-10-CM | POA: Insufficient documentation

## 2020-08-31 DIAGNOSIS — Z7901 Long term (current) use of anticoagulants: Secondary | ICD-10-CM | POA: Insufficient documentation

## 2020-08-31 DIAGNOSIS — E2740 Unspecified adrenocortical insufficiency: Secondary | ICD-10-CM | POA: Insufficient documentation

## 2020-08-31 DIAGNOSIS — G8929 Other chronic pain: Secondary | ICD-10-CM | POA: Insufficient documentation

## 2020-08-31 DIAGNOSIS — Z7982 Long term (current) use of aspirin: Secondary | ICD-10-CM | POA: Insufficient documentation

## 2020-08-31 DIAGNOSIS — D649 Anemia, unspecified: Secondary | ICD-10-CM | POA: Diagnosis not present

## 2020-08-31 DIAGNOSIS — N183 Chronic kidney disease, stage 3 unspecified: Secondary | ICD-10-CM | POA: Diagnosis not present

## 2020-08-31 DIAGNOSIS — M109 Gout, unspecified: Secondary | ICD-10-CM | POA: Diagnosis not present

## 2020-08-31 LAB — CBC
HCT: 41.4 % (ref 36.0–46.0)
Hemoglobin: 13.1 g/dL (ref 12.0–15.0)
MCH: 27.8 pg (ref 26.0–34.0)
MCHC: 31.6 g/dL (ref 30.0–36.0)
MCV: 87.9 fL (ref 80.0–100.0)
Platelets: 353 10*3/uL (ref 150–400)
RBC: 4.71 MIL/uL (ref 3.87–5.11)
RDW: 14.7 % (ref 11.5–15.5)
WBC: 8.7 10*3/uL (ref 4.0–10.5)
nRBC: 0 % (ref 0.0–0.2)

## 2020-08-31 LAB — PROTIME-INR
INR: 1 (ref 0.8–1.2)
Prothrombin Time: 12.9 seconds (ref 11.4–15.2)

## 2020-08-31 MED ORDER — LIDOCAINE-EPINEPHRINE 1 %-1:100000 IJ SOLN
INTRAMUSCULAR | Status: AC
  Filled 2020-08-31: qty 1

## 2020-08-31 MED ORDER — SODIUM CHLORIDE 0.9 % IV SOLN
INTRAVENOUS | Status: AC | PRN
  Administered 2020-08-31: 10 mL/h via INTRAVENOUS

## 2020-08-31 MED ORDER — MIDAZOLAM HCL 2 MG/2ML IJ SOLN
INTRAMUSCULAR | Status: AC | PRN
  Administered 2020-08-31 (×2): 0.5 mg via INTRAVENOUS
  Administered 2020-08-31: 1 mg via INTRAVENOUS

## 2020-08-31 MED ORDER — SODIUM CHLORIDE 0.9 % IV SOLN: INTRAVENOUS | Status: DC

## 2020-08-31 MED ORDER — MIDAZOLAM HCL 2 MG/2ML IJ SOLN
INTRAMUSCULAR | Status: AC
  Filled 2020-08-31: qty 2

## 2020-08-31 MED ORDER — FENTANYL CITRATE (PF) 100 MCG/2ML IJ SOLN
INTRAMUSCULAR | Status: AC | PRN
  Administered 2020-08-31: 25 ug via INTRAVENOUS
  Administered 2020-08-31: 50 ug via INTRAVENOUS
  Administered 2020-08-31: 25 ug via INTRAVENOUS

## 2020-08-31 MED ORDER — FENTANYL CITRATE (PF) 100 MCG/2ML IJ SOLN
INTRAMUSCULAR | Status: AC
  Filled 2020-08-31: qty 2

## 2020-08-31 NOTE — Progress Notes (Signed)
Pt states new discomfort in abdomen, RUQ. Only about a 2 out of 10 on pain scale. Notified PA for pt reassurance.

## 2020-08-31 NOTE — H&P (Signed)
Chief Complaint: Patient was seen in consultation today for a random renal biopsy.  Referring Physician(s): Singh,Vikas  Supervising Physician: Sandi Mariscal  Patient Status: [facility] - Out-pt  History of Present Illness: Crystal Larsen is a 64 y.o. female with a past medical history significant for anxiety, GERD, IBS, anemia, HTN, hypothyroidism, adrenal insufficiency, seizures, lupus and CKD III who presents today for a random renal biopsy. Crystal Larsen presented to the ED in May of 2020 after routine labs noted concern for adrenal crisis/insufficiecnt of unknown etiology - she was given hydrocortisone and diagnosed with SLE. She was also found to have AKI/CKD and was referred to nephrology for follow up. She has been followed by [facility] (Dr. Candiss Norse) and was noted to have proteinuria on her most recent UA. She presents to IR today for a random renal biopsy to further evaluate these findings.  Mr. Hashemi denies any complaints today, she has chronic back pain but feels ok right now. She is nervous about the biopsy but is ready to proceed.  Past Medical History:  Diagnosis Date  . Acute kidney injury ([place])   . Adrenal insufficiency ([place])   . Anemia   . Anxiety   . Arthritis    "left foot; left hand; right shoulder; back" (11/04/2014)  . Elevated liver enzymes   . Gallstones   . GERD (gastroesophageal reflux disease)   . History of gout   . HTN (hypertension)   . Hypothyroidism   . IBS (irritable bowel syndrome)    ?  [name] Lupus ([place]) 07/2019  . Metabolic acidosis   . Migraine    "maybe once/yr" (11/04/2014)  . Neuropathy    right leg  . Obesity   . Pancreatitis   . Pancreatitis   . PONV (postoperative nausea and vomiting) 1981 X 1   only time  . Seizures ([place]) 2003    due to tramadol reaction -- takes depakote for migraine prevention  . Sleep apnea    does not use cpap  . Spondylolisthesis of lumbar region   . Unsteady gait   . Wears glasses     Past Surgical  History:  Procedure Laterality Date  . ANKLE GANGLION CYST EXCISION Right 1980's  . ANTERIOR LAT LUMBAR FUSION N/A 12/09/2019   Procedure: Lumbar One-Two Anterolateral lumbar decompression/interbody fusion with lateral plate;  Surgeon: Kristeen Miss, MD;  Location: [place];  Service: Neurosurgery;  Laterality: N/A;  Lumbar One-Two Anterolateral lumbar decompression/interbody fusion with lateral plate  . BACK SURGERY    . BREAST SURGERY     biopsy  . BUNIONECTOMY Left 2012  . CARPAL TUNNEL RELEASE Right 05/22/2019   Procedure: RIGHT CARPAL TUNNEL RELEASE;  Surgeon: Daryll Brod, MD;  Location: [place];  Service: Orthopedics;  Laterality: Right;  . CARPOMETACARPEL SUSPENSION PLASTY Right 05/22/2019   Procedure: ARTHROPLASY CARPOMETACARPAL RIGHT THUMB TRAPEZIUM EXCISION TENDON TRANSFER MICRO LINK SUSPENSION;  Surgeon: Daryll Brod, MD;  Location: [place];  Service: Orthopedics;  Laterality: Right;  . CESAREAN SECTION  1986  . COLONOSCOPY W/ BIOPSIES AND POLYPECTOMY    . EYE SURGERY Bilateral 2012   "lasered a hole in my iris cause pressure was building up"  . LAPAROSCOPIC CHOLECYSTECTOMY  1990's  . NASAL SINUS SURGERY  1980's  . POSTERIOR LUMBAR FUSION  05/2013   L4-5; S1  . POSTERIOR LUMBAR FUSION  11/04/2014   L3-4  . SHOULDER ARTHROSCOPY W/ ROTATOR CUFF REPAIR Right 2012  . [place]  Allergies: Ultram [tramadol], Adhesive [tape], Erythromycin, Latex, Neurontin [gabapentin], and Sulfonamide derivatives  Medications: Prior to Admission medications   Medication Sig Start Date End Date Taking? Authorizing Provider  acetaminophen (TYLENOL) 500 MG tablet Take 1,000 mg by mouth 3 (three) times daily.    Yes [provider]  amLODipine (NORVASC) 10 MG tablet Take 10 mg by mouth at bedtime.  11/29/19  Yes [provider]  aspirin EC 81 MG tablet Take 81 mg by mouth at bedtime.   Yes [provider]  desvenlafaxine (PRISTIQ)  50 MG 24 hr tablet Take 1 tablet (50 mg total) by mouth daily. 07/08/20  Yes Lomax, Amy, NP  dicyclomine (BENTYL) 20 MG tablet Take 20 mg by mouth 3 (three) times daily as needed (abdominal pain).   Yes [provider]  diphenhydrAMINE HCl, Sleep, (UNISOM SLEEPGELS) 50 MG CAPS Take 50 mg by mouth at bedtime.   Yes [provider]  divalproex (DEPAKOTE ER) 250 MG 24 hr tablet TAKE 1 TABLET BY MOUTH  DAILY 08/30/20  Yes Dohmeier, Asencion Partridge, MD  Fe Fum-FePoly-Vit C-Vit B3 (INTEGRA PO) Take 1 tablet by mouth daily.   Yes [provider]  folic acid (FOLVITE) 1 MG tablet Take 1 mg by mouth daily.   Yes [provider]  hydrocortisone (CORTEF) 10 MG tablet Take 5-15 mg by mouth See admin instructions. Take 15 mg in the morning and 5 mg in the afternoon   Yes [provider]  hydroxychloroquine (PLAQUENIL) 200 MG tablet Take 200 mg by mouth 2 (two) times daily. 11/25/19  Yes [provider]  levocetirizine (XYZAL) 5 MG tablet Take 5 mg by mouth at bedtime.   Yes [provider]  levothyroxine (SYNTHROID) 200 MCG tablet Take 200 mcg by mouth daily before breakfast.   Yes [provider]  losartan (COZAAR) 100 MG tablet Take 100 mg by mouth daily. 11/12/19  Yes [provider]  Melatonin 10 MG TBDP Take 10 mg by mouth at bedtime.   Yes [provider]  niacin (NIASPAN) 500 MG CR tablet Take 500 mg by mouth at bedtime.   Yes [provider]  Omega-3 Fatty Acids (OMEGA 3 PO) Take 1 capsule by mouth at bedtime.   Yes [provider]  oxymetazoline (AFRIN) 0.05 % nasal spray Place 1 spray into both nostrils 2 (two) times daily as needed for congestion.   Yes [provider]  predniSONE (DELTASONE) 5 MG tablet Take 5 mg by mouth daily with breakfast.   Yes [provider]  pregabalin (LYRICA) 50 MG capsule Take 50 mg by mouth 2 (two) times daily.   Yes [provider]  Probiotic  Product (PROBIOTIC PO) Take 1 capsule by mouth daily.    Yes [provider]     Family History  Problem Relation Age of Onset  . Asthma Mother   . Lung cancer Mother   . Heart disease Father   . Heart disease Maternal Grandfather        PGF  . Alcohol abuse Maternal Grandfather   . Hyperlipidemia Maternal Grandfather   . Anxiety disorder Sister   . Fibromyalgia Sister   . Nephrolithiasis Daughter     Social History   Socioeconomic History  . Marital status: Married    Spouse name: Not on file  . Number of children: 2  . Years of education: Not on file  . Highest education level: Not on file  Occupational History  . Occupation: retired  Tobacco Use  . Smoking status: Never Smoker  . Smokeless tobacco: Never Used  Vaping Use  . Vaping Use: Never used  Substance and Sexual Activity  . Alcohol use: Yes    Alcohol/week: 1.0 standard drink    Types: 1 Glasses of wine per week    Comment: occasional  . Drug use: No  . Sexual activity: Not Currently  Other Topics Concern  . Not on file  Social History Narrative   Right handed. Caffeine 3 cups coffee daily, BS, Married, 2 kids.    Social Determinants of Health   Financial Resource Strain:   . Difficulty of Paying Living Expenses: Not on file  Food Insecurity:   . Worried About Charity fundraiser in the Last Year: Not on file  . Ran Out of Food in the Last Year: Not on file  Transportation Needs:   . Lack of Transportation (Medical): Not on file  . Lack of Transportation (Non-Medical): Not on file  Physical Activity:   . Days of Exercise per Week: Not on file  . Minutes of Exercise per Session: Not on file  Stress:   . Feeling of Stress : Not on file  Social Connections:   . Frequency of Communication with Friends and Family: Not on file  . Frequency of Social Gatherings with Friends and Family: Not on file  . Attends Religious Services: Not on file  . Active Member of Clubs or Organizations: Not on  file  . Attends Archivist Meetings: Not on file  . Marital Status: Not on file     Review of Systems: A 12 point ROS discussed and pertinent positives are indicated in the HPI above.  All other systems are negative.  Review of Systems  Constitutional: Negative for chills and fever.  Respiratory: Negative for cough and shortness of breath.   Cardiovascular: Negative for chest pain.  Gastrointestinal: Negative for abdominal pain, diarrhea, nausea and vomiting.  Genitourinary: Negative for dysuria and hematuria.  Musculoskeletal: Positive for back pain.  Neurological: Negative for dizziness and headaches.    Vital Signs: BP (!) 159/82   Pulse 65   Temp 98.1 F (36.7 C) (Oral)   Resp 16   Ht 5' 6.75" (1.695 m)   Wt 166 lb (75.3 kg)   SpO2 100%   BMI 26.19 kg/m   Physical Exam Vitals reviewed.  Constitutional:      General: She is not in acute distress. HENT:     Head: Normocephalic.     Mouth/Throat:     Mouth: Mucous membranes are moist.     Pharynx: Oropharynx is clear. No oropharyngeal exudate or posterior oropharyngeal erythema.  Cardiovascular:     Rate and Rhythm: Normal rate and regular rhythm.  Pulmonary:     Effort: Pulmonary effort is normal.     Breath sounds: Normal breath sounds.  Abdominal:     General: There is no distension.     Palpations: Abdomen is soft.     Tenderness: There is no abdominal tenderness.  Skin:    General: Skin is warm and dry.  Neurological:     Mental Status: She is alert and oriented to person, place, and time.  Psychiatric:        Mood and Affect: Mood normal.        Behavior: Behavior normal.        Thought Content: Thought content normal.        Judgment: Judgment normal.  MD Evaluation Airway: WNL Heart: WNL Abdomen: WNL Chest/ Lungs: WNL ASA  Classification: 2 Mallampati/Airway Score: One   Imaging: No results found.  Labs:  CBC: Recent Labs    12/05/19 1210 12/17/19 1453  08/31/20 0609  WBC 6.4 7.3 8.7  HGB 13.1 13.0 13.1  HCT 41.5 41.7 41.4  PLT 326 374 353    COAGS: Recent Labs    08/31/20 0609  INR 1.0    BMP: Recent Labs    12/05/19 1210 12/17/19 1453  NA 137 134*  K 3.5 4.0  CL 104 102  CO2 26 25  GLUCOSE 96 100*  BUN 15 22  CALCIUM 9.7 9.6  CREATININE 1.07* 1.05*  GFRNONAA 55* 56*  GFRAA >60 >60    LIVER FUNCTION TESTS: Recent Labs    12/17/19 1453  BILITOT 0.5  AST 62*  ALT 60*  ALKPHOS 84  PROT 7.1  ALBUMIN 3.5    TUMOR MARKERS: No results for input(s): AFPTM, CEA, CA199, CHROMGRNA in the last 8760 hours.  Assessment and Plan:  64 y/o F with history of CKD III and SLE noted to have proteinuria on most recent UA who presents today for a random renal biopsy to further evaluate these findings.  Patient has been NPO since 7 pm last night, no current anticoagulation/antiplatelet medications. Afebrile, WBC 8.7, hgb 13.1, plt 353, INR 1.0.  Risks and benefits of random renal biopsy was discussed with the patient and/or patient's family including, but not limited to bleeding, infection, damage to adjacent structures or low yield requiring additional tests.  All of the questions were answered and there is agreement to proceed.  Consent signed and in chart.  Thank you for this interesting consult.  I greatly enjoyed meeting Crystal Larsen and look forward to participating in their care.  A copy of this report was sent to the requesting provider on this date.  Electronically Signed: Joaquim Nam, PA-C 08/31/2020, 7:45 AM   I spent a total of30 Minutes in face to face in clinical consultation, greater than 50% of which was counseling/coordinating care for a random renal biopsy.

## 2020-08-31 NOTE — Discharge Instructions (Signed)
Percutaneous Kidney Biopsy, Care After This sheet gives you information about how to care for yourself after your procedure. Your health care provider may also give you more specific instructions. If you have problems or questions, contact your health care provider. What can I expect after the procedure? After the procedure, it is common to have:  Pain or soreness near the biopsy site.  Pink or cloudy urine for 24 hours after the procedure. Follow these instructions at home: Activity  Return to your normal activities as told by your health care provider. Ask your health care provider what activities are safe for you.  If you were given a sedative during the procedure, it can affect you for several hours. Do not drive or operate machinery until your health care provider says that it is safe.  Do not lift anything that is heavier than 10 lb (4.5 kg), or the limit that you are told, until your health care provider says that it is safe. About 1 week  Avoid activities that take a lot of effort (are strenuous) until your health care provider approves. Most people will have to wait 2 weeks before returning to activities such as exercise or sex. General instructions   Take over-the-counter and prescription medicines only as told by your health care provider.  You may eat and drink after your procedure. Follow instructions from your health care provider about eating or drinking restrictions.  You may remove your dressing in 24 hours. Then you may shower.  Check your biopsy site every day for signs of infection. Check for: ? More redness, swelling, or pain. ? Fluid or blood. ? Warmth. ? Pus or a bad smell.  Keep all follow-up visits as told by your health care provider. This is important. Contact a health care provider if:  You have more redness, swelling, or pain around your biopsy site.  You have fluid or blood coming from your biopsy site.  Your biopsy site feels warm to the  touch.  You have pus or a bad smell coming from your biopsy site.  You have blood in your urine more than 24 hours after your procedure.  You have a fever. Get help right away if:  Your urine is dark red or brown.  You cannot urinate.  It burns when you urinate.  You feel dizzy or light-headed.  You have severe pain in your abdomen or side. Summary  After the procedure, it is common to have pain or soreness at the biopsy site and pink or cloudy urine for the first 24 hours.  Check your biopsy site each day for signs of infection, such as more redness, swelling, or pain; fluid, blood, pus or a bad smell coming from the biopsy site; or the biopsy site feeling warm to touch.  Return to your normal activities as told by your health care provider. This information is not intended to replace advice given to you by your health care provider. Make sure you discuss any questions you have with your health care provider. Document Revised: 07/17/2019 Document Reviewed: 07/17/2019 Elsevier Patient Education  [place].  Moderate Conscious Sedation, Adult, Care After These instructions provide you with information about caring for yourself after your procedure. Your health care provider may also give you more specific instructions. Your treatment has been planned according to current medical practices, but problems sometimes occur. Call your health care provider if you have any problems or questions after your procedure. What can I expect after the procedure? After  your procedure, it is common:  To feel sleepy for several hours.  To feel clumsy and have poor balance for several hours.  To have poor judgment for several hours.  To vomit if you eat too soon. Follow these instructions at home: For at least 24 hours after the procedure:   Do not: ? Participate in activities where you could fall or become injured. ? Drive. ? Use heavy machinery. ? Drink alcohol. ? Take  sleeping pills or medicines that cause drowsiness. ? Make important decisions or sign legal documents. ? Take care of children on your own.  Rest. Eating and drinking  Follow the diet recommended by your health care provider.  If you vomit: ? Drink water, juice, or soup when you can drink without vomiting. ? Make sure you have little or no nausea before eating solid foods. General instructions  Have a responsible adult stay with you until you are awake and alert.  Take over-the-counter and prescription medicines only as told by your health care provider.  If you smoke, do not smoke without supervision.  Keep all follow-up visits as told by your health care provider. This is important. Contact a health care provider if:  You keep feeling nauseous or you keep vomiting.  You feel light-headed.  You develop a rash.  You have a fever. Get help right away if:  You have trouble breathing. This information is not intended to replace advice given to you by your health care provider. Make sure you discuss any questions you have with your health care provider. Document Revised: 10/26/2017 Document Reviewed: 03/04/2016 Elsevier Patient Education  2020 Elsevier Inc.  

## 2020-08-31 NOTE — Procedures (Signed)
Pre Procedure Dx: Proteinuria Post Procedural Dx: Same  Technically successful US guided biopsy of inferior pole of the right kidney.  EBL: None  No immediate complications.   Jay Millicent Blazejewski, MD Pager #: [phone]    

## 2020-09-02 ENCOUNTER — Encounter: Payer: Self-pay | Admitting: Adult Health

## 2020-09-02 ENCOUNTER — Ambulatory Visit (INDEPENDENT_AMBULATORY_CARE_PROVIDER_SITE_OTHER): Payer: 59 | Admitting: Adult Health

## 2020-09-02 VITALS — BP 128/82 | HR 76 | Ht 66.75 in | Wt 170.0 lb

## 2020-09-02 DIAGNOSIS — F32A Depression, unspecified: Secondary | ICD-10-CM | POA: Diagnosis not present

## 2020-09-02 DIAGNOSIS — G43909 Migraine, unspecified, not intractable, without status migrainosus: Secondary | ICD-10-CM

## 2020-09-02 MED ORDER — DIVALPROEX SODIUM ER 250 MG PO TB24: 250.0000 mg | ORAL_TABLET | Freq: Every day | ORAL | 3 refills | Status: DC

## 2020-09-02 NOTE — Patient Instructions (Signed)
Your Plan:  Continue Depakote and pristiq  If your symptoms worsen or you develop new symptoms please let us know.   Thank you for coming to see [place] at [facility]. I hope we have been able to provide you high quality care today.  You may receive a patient satisfaction survey over the next few weeks. We would appreciate your feedback and comments so that we may continue to improve ourselves and the health of our patients.

## 2020-09-02 NOTE — Progress Notes (Signed)
PATIENT: Crystal Larsen DOB: February 01, 1956  REASON FOR VISIT: follow up HISTORY FROM: patient  HISTORY OF PRESENT ILLNESS: Today 09/02/20:  Ms. Crystal Larsen is a 64 year old female with a history of postconcussive syndrome and migraine headaches.  She returns today for follow-up.  She continues on Depakote 250 mg daily.Reports that her headaches are very infrequent.  She states that she typically goes several months with no headaches.  Her most recent headache was this weekend.  They always present with black spots in her vision followed by prisms.  This typically lasts for approximately 20 minutes.  Headache never follows.  Overall she feels that she is doing well.  She was diagnosed with lupus and adrenal insufficiency last year.  Reports that she continues to do well with Pristiq.  She returns today for an evaluation.  HISTORY 05/20/19 Crystal Larsen is a 64 y.o. female here today for follow up of post concussive syndrome.  She reports that she is doing well overall.  She has had 3 headaches over the past 6 to 8 months.  She reports that these headaches resolve spontaneously without treatment.  She does continue to have visual disturbance prior to headache.  She reports that some instances are simply just the visual disturbance and no headache.  She does continue Depakote 250 mg daily.  She is tolerating this medication well with no obvious adverse effects.  REVIEW OF SYSTEMS: Out of a complete 14 system review of symptoms, the patient complains only of the following symptoms, and all other reviewed systems are negative.  See HPI  ALLERGIES: Allergies  Allergen Reactions  . Ultram [Tramadol] Other (See Comments)    seizures  . Adhesive [Tape] Rash    bandaide  . Erythromycin Rash  . Latex Rash  . Neurontin [Gabapentin] Itching  . Sulfonamide Derivatives Rash    HOME MEDICATIONS: Outpatient Medications Prior to Visit  Medication Sig Dispense Refill  . acetaminophen (TYLENOL) 500  MG tablet Take 1,000 mg by mouth 3 (three) times daily.     [name] amLODipine (NORVASC) 10 MG tablet Take 10 mg by mouth at bedtime.     [name] aspirin EC 81 MG tablet Take 81 mg by mouth at bedtime.    [name] desvenlafaxine (PRISTIQ) 50 MG 24 hr tablet Take 1 tablet (50 mg total) by mouth daily. 90 tablet 1  . dicyclomine (BENTYL) 20 MG tablet Take 20 mg by mouth 3 (three) times daily as needed (abdominal pain).    [name] diphenhydrAMINE HCl, Sleep, (UNISOM SLEEPGELS) 50 MG CAPS Take 50 mg by mouth at bedtime.    . divalproex (DEPAKOTE ER) 250 MG 24 hr tablet TAKE 1 TABLET BY MOUTH  DAILY 90 tablet 3  . Fe Fum-FePoly-Vit C-Vit B3 (INTEGRA PO) Take 1 tablet by mouth daily.    . folic acid (FOLVITE) 1 MG tablet Take 1 mg by mouth daily.    . hydrocortisone (CORTEF) 10 MG tablet Take 5-15 mg by mouth See admin instructions. Take 15 mg in the morning and 5 mg in the afternoon    . hydroxychloroquine (PLAQUENIL) 200 MG tablet Take 200 mg by mouth 2 (two) times daily.    [name] levocetirizine (XYZAL) 5 MG tablet Take 5 mg by mouth at bedtime.    [name] levothyroxine (SYNTHROID) 200 MCG tablet Take 200 mcg by mouth daily before breakfast.    . losartan (COZAAR) 100 MG tablet Take 100 mg by mouth daily.    . Melatonin 10 MG TBDP Take 10  mg by mouth at bedtime.    . niacin (NIASPAN) 500 MG CR tablet Take 500 mg by mouth at bedtime.    . Omega-3 Fatty Acids (OMEGA 3 PO) Take 1 capsule by mouth at bedtime.    [name] oxymetazoline (AFRIN) 0.05 % nasal spray Place 1 spray into both nostrils 2 (two) times daily as needed for congestion.    . predniSONE (DELTASONE) 5 MG tablet Take 5 mg by mouth daily with breakfast.    . pregabalin (LYRICA) 50 MG capsule Take 50 mg by mouth 2 (two) times daily.    . Probiotic Product (PROBIOTIC PO) Take 1 capsule by mouth daily.      No facility-administered medications prior to visit.    PAST MEDICAL HISTORY: Past Medical History:  Diagnosis Date  . Acute kidney injury ([place])   . Adrenal insufficiency  ([place])   . Anemia   . Anxiety   . Arthritis    "left foot; left hand; right shoulder; back" (11/04/2014)  . Elevated liver enzymes   . Gallstones   . GERD (gastroesophageal reflux disease)   . History of gout   . HTN (hypertension)   . Hypothyroidism   . IBS (irritable bowel syndrome)    ?  [name] Lupus ([place]) 07/2019  . Metabolic acidosis   . Migraine    "maybe once/yr" (11/04/2014)  . Neuropathy    right leg  . Obesity   . Pancreatitis   . Pancreatitis   . PONV (postoperative nausea and vomiting) 1981 X 1   only time  . Seizures ([place]) 2003    due to tramadol reaction -- takes depakote for migraine prevention  . Sleep apnea    does not use cpap  . Spondylolisthesis of lumbar region   . Unsteady gait   . Wears glasses     PAST SURGICAL HISTORY: Past Surgical History:  Procedure Laterality Date  . ANKLE GANGLION CYST EXCISION Right 1980's  . ANTERIOR LAT LUMBAR FUSION N/A 12/09/2019   Procedure: Lumbar One-Two Anterolateral lumbar decompression/interbody fusion with lateral plate;  Surgeon: Kristeen Miss, MD;  Location: [facility];  Service: Neurosurgery;  Laterality: N/A;  Lumbar One-Two Anterolateral lumbar decompression/interbody fusion with lateral plate  . BACK SURGERY    . BREAST SURGERY     biopsy  . BUNIONECTOMY Left 2012  . CARPAL TUNNEL RELEASE Right 05/22/2019   Procedure: RIGHT CARPAL TUNNEL RELEASE;  Surgeon: Daryll Brod, MD;  Location: [facility];  Service: Orthopedics;  Laterality: Right;  . CARPOMETACARPEL SUSPENSION PLASTY Right 05/22/2019   Procedure: ARTHROPLASY CARPOMETACARPAL RIGHT THUMB TRAPEZIUM EXCISION TENDON TRANSFER MICRO LINK SUSPENSION;  Surgeon: Daryll Brod, MD;  Location: [facility];  Service: Orthopedics;  Laterality: Right;  . CESAREAN SECTION  1986  . COLONOSCOPY W/ BIOPSIES AND POLYPECTOMY    . EYE SURGERY Bilateral 2012   "lasered a hole in my iris cause pressure was building up"  . LAPAROSCOPIC CHOLECYSTECTOMY  1990's   . NASAL SINUS SURGERY  1980's  . POSTERIOR LUMBAR FUSION  05/2013   L4-5; S1  . POSTERIOR LUMBAR FUSION  11/04/2014   L3-4  . SHOULDER ARTHROSCOPY W/ ROTATOR CUFF REPAIR Right 2012  . TUBAL LIGATION  1992    FAMILY HISTORY: Family History  Problem Relation Age of Onset  . Asthma Mother   . Lung cancer Mother   . Heart disease Father   . Heart disease Maternal Grandfather        PGF  . Alcohol abuse  Maternal Grandfather   . Hyperlipidemia Maternal Grandfather   . Anxiety disorder Sister   . Fibromyalgia Sister   . Nephrolithiasis Daughter     SOCIAL HISTORY: Social History   Socioeconomic History  . Marital status: Married    Spouse name: Not on file  . Number of children: 2  . Years of education: Not on file  . Highest education level: Not on file  Occupational History  . Occupation: retired  Tobacco Use  . Smoking status: Never Smoker  . Smokeless tobacco: Never Used  Vaping Use  . Vaping Use: Never used  Substance and Sexual Activity  . Alcohol use: Yes    Alcohol/week: 1.0 standard drink    Types: 1 Glasses of wine per week    Comment: occasional  . Drug use: No  . Sexual activity: Not Currently  Other Topics Concern  . Not on file  Social History Narrative   Right handed. Caffeine 3 cups coffee daily, BS, Married, 2 kids.    Social Determinants of Health   Financial Resource Strain:   . Difficulty of Paying Living Expenses: Not on file  Food Insecurity:   . Worried About Charity fundraiser in the Last Year: Not on file  . Ran Out of Food in the Last Year: Not on file  Transportation Needs:   . Lack of Transportation (Medical): Not on file  . Lack of Transportation (Non-Medical): Not on file  Physical Activity:   . Days of Exercise per Week: Not on file  . Minutes of Exercise per Session: Not on file  Stress:   . Feeling of Stress : Not on file  Social Connections:   . Frequency of Communication with Friends and Family: Not on file  . Frequency  of Social Gatherings with Friends and Family: Not on file  . Attends Religious Services: Not on file  . Active Member of Clubs or Organizations: Not on file  . Attends Archivist Meetings: Not on file  . Marital Status: Not on file  Intimate Partner Violence:   . Fear of Current or Ex-Partner: Not on file  . Emotionally Abused: Not on file  . Physically Abused: Not on file  . Sexually Abused: Not on file      PHYSICAL EXAM  Vitals:   09/02/20 1358  BP: 128/82  Pulse: 76  Weight: 170 lb (77.1 kg)  Height: 5' 6.75" (1.695 m)   Body mass index is 26.83 kg/m.  Generalized: Well developed, in no acute distress   Neurological examination  Mentation: Alert oriented to time, place, history taking. Follows all commands speech and language fluent Cranial nerve II-XII: Pupils were equal round reactive to light. Extraocular movements were full, visual field were full on confrontational test. Head turning and shoulder shrug  were normal and symmetric. Motor: The motor testing reveals 5 over 5 strength of all 4 extremities. Good symmetric motor tone is noted throughout.  Sensory: Sensory testing is intact to soft touch on all 4 extremities. No evidence of extinction is noted.  Coordination: Cerebellar testing reveals good finger-nose-finger and heel-to-shin bilaterally.  Gait and station: Gait is normal.  Reflexes: Deep tendon reflexes are symmetric and normal bilaterally.   DIAGNOSTIC DATA (LABS, IMAGING, TESTING) - I reviewed patient records, labs, notes, testing and imaging myself where available.  Lab Results  Component Value Date   WBC 8.7 08/31/2020   HGB 13.1 08/31/2020   HCT 41.4 08/31/2020   MCV 87.9 08/31/2020   PLT  353 08/31/2020      Component Value Date/Time   NA 134 (L) 12/17/2019 1453   NA 138 03/27/2018 1524   K 4.0 12/17/2019 1453   CL 102 12/17/2019 1453   CO2 25 12/17/2019 1453   GLUCOSE 100 (H) 12/17/2019 1453   BUN 22 12/17/2019 1453   BUN 21  03/27/2018 1524   CREATININE 1.05 (H) 12/17/2019 1453   CALCIUM 9.6 12/17/2019 1453   PROT 7.1 12/17/2019 1453   PROT 7.5 03/27/2018 1524   ALBUMIN 3.5 12/17/2019 1453   ALBUMIN 4.0 03/27/2018 1524   AST 62 (H) 12/17/2019 1453   ALT 60 (H) 12/17/2019 1453   ALKPHOS 84 12/17/2019 1453   BILITOT 0.5 12/17/2019 1453   BILITOT 0.3 03/27/2018 1524   GFRNONAA 56 (L) 12/17/2019 1453   GFRAA >60 12/17/2019 1453    Lab Results  Component Value Date   TSH 13.506 (H) 04/01/2019      ASSESSMENT AND PLAN 64 y.o. year old female  has a past medical history of Acute kidney injury ([place]), Adrenal insufficiency ([place]), Anemia, Anxiety, Arthritis, Elevated liver enzymes, Gallstones, GERD (gastroesophageal reflux disease), History of gout, HTN (hypertension), Hypothyroidism, IBS (irritable bowel syndrome), Lupus ([place]) (01/7047), Metabolic acidosis, Migraine, Neuropathy, Obesity, Pancreatitis, Pancreatitis, PONV (postoperative nausea and vomiting) (1981 X 1), Seizures ([place]) (2003), Sleep apnea, Spondylolisthesis of lumbar region, Unsteady gait, and Wears glasses. here with:  Migraine headaches   Continue Depakote 250 mg daily  Depression   Continue Pristiq  Overall the patient is doing well.  She will follow-up in 1 year or sooner if needed  I spent 25 minutes of face-to-face and non-face-to-face time with patient.  This included previsit chart review, lab review, study review, order entry, electronic health record documentation, patient education.  Ward Givens, MSN, NP-C 09/02/2020, 1:57 PM [facility] [address] [place], [place] [address] [phone]

## 2020-09-06 ENCOUNTER — Ambulatory Visit: Payer: 59

## 2020-09-06 ENCOUNTER — Other Ambulatory Visit: Payer: Self-pay

## 2020-09-06 DIAGNOSIS — M6281 Muscle weakness (generalized): Secondary | ICD-10-CM

## 2020-09-06 DIAGNOSIS — R2681 Unsteadiness on feet: Secondary | ICD-10-CM

## 2020-09-06 DIAGNOSIS — M25551 Pain in right hip: Secondary | ICD-10-CM

## 2020-09-06 DIAGNOSIS — M545 Low back pain, unspecified: Secondary | ICD-10-CM

## 2020-09-06 DIAGNOSIS — G8929 Other chronic pain: Secondary | ICD-10-CM

## 2020-09-06 NOTE — Therapy (Signed)
[facility] [address] [place], [place], [address] Phone: [phone]   Fax:  [phone]  Physical Therapy Treatment  Patient Details  Name: Crystal Larsen MRN: [id number] Date of Birth: 1956/01/24 Referring Provider (PT): Jonathon Jordan MD   Encounter Date: 09/06/2020   PT End of Session - 09/06/20 1228    Visit Number 10    Date for PT Re-Evaluation 09/22/20    PT Start Time 1143    PT Stop Time 1228    PT Time Calculation (min) 45 min    Activity Tolerance Patient tolerated treatment well;No increased pain    Behavior During Therapy WFL for tasks assessed/performed           Past Medical History:  Diagnosis Date  . Acute kidney injury ([place])   . Adrenal insufficiency ([place])   . Anemia   . Anxiety   . Arthritis    "left foot; left hand; right shoulder; back" (11/04/2014)  . Elevated liver enzymes   . Gallstones   . GERD (gastroesophageal reflux disease)   . History of gout   . HTN (hypertension)   . Hypothyroidism   . IBS (irritable bowel syndrome)    ?  [name] Lupus ([place]) 07/2019  . Metabolic acidosis   . Migraine    "maybe once/yr" (11/04/2014)  . Neuropathy    right leg  . Obesity   . Pancreatitis   . Pancreatitis   . PONV (postoperative nausea and vomiting) 1981 X 1   only time  . Seizures ([place]) 2003    due to tramadol reaction -- takes depakote for migraine prevention  . Sleep apnea    does not use cpap  . Spondylolisthesis of lumbar region   . Unsteady gait   . Wears glasses     Past Surgical History:  Procedure Laterality Date  . ANKLE GANGLION CYST EXCISION Right 1980's  . ANTERIOR LAT LUMBAR FUSION N/A 12/09/2019   Procedure: Lumbar One-Two Anterolateral lumbar decompression/interbody fusion with lateral plate;  Surgeon: Kristeen Miss, MD;  Location: [facility];  Service: Neurosurgery;  Laterality: N/A;  Lumbar One-Two Anterolateral lumbar decompression/interbody fusion with lateral  plate  . BACK SURGERY    . BREAST SURGERY     biopsy  . BUNIONECTOMY Left 2012  . CARPAL TUNNEL RELEASE Right 05/22/2019   Procedure: RIGHT CARPAL TUNNEL RELEASE;  Surgeon: Daryll Brod, MD;  Location: [facility];  Service: Orthopedics;  Laterality: Right;  . CARPOMETACARPEL SUSPENSION PLASTY Right 05/22/2019   Procedure: ARTHROPLASY CARPOMETACARPAL RIGHT THUMB TRAPEZIUM EXCISION TENDON TRANSFER MICRO LINK SUSPENSION;  Surgeon: Daryll Brod, MD;  Location: [facility];  Service: Orthopedics;  Laterality: Right;  . CESAREAN SECTION  1986  . COLONOSCOPY W/ BIOPSIES AND POLYPECTOMY    . EYE SURGERY Bilateral 2012   "lasered a hole in my iris cause pressure was building up"  . LAPAROSCOPIC CHOLECYSTECTOMY  1990's  . NASAL SINUS SURGERY  1980's  . POSTERIOR LUMBAR FUSION  05/2013   L4-5; S1  . POSTERIOR LUMBAR FUSION  11/04/2014   L3-4  . SHOULDER ARTHROSCOPY W/ ROTATOR CUFF REPAIR Right 2012  . TUBAL LIGATION  1992    There were no vitals filed for this visit.   Subjective Assessment - 09/06/20 1149    Subjective I had a kidney procedue last week and am still waiting on results.  Pain in lowe abdomen is better now.    Pertinent History Lupus, CTS right hand and  thumb surgery, hypothyroid, lumbar fusion January 2021 L1/L2 (fused to L5/S1), Rt RCR 2013, OA, HTN, neuropathy    Currently in Pain? No/denies              [facility] PT Assessment - 09/06/20 0001      Assessment   Medical Diagnosis dorsalgia; pain in uspecified joint; systemic lupus    Referring Provider (PT) Jonathon Jordan MD    Onset Date/Surgical Date 02/26/20    Hand Dominance Right      Observation/Other Assessments   Focus on Therapeutic Outcomes (FOTO)  46% limitation      Strength   Overall Strength Comments bil knees 5/5, ankle DF 5/5    Right Hip Flexion 4+/5    Left Hip Flexion 4+/5                         [facility] Adult PT Treatment/Exercise - 09/06/20 0001       Transfers   Five time sit to stand comments  9.64 without hands      Lumbar Exercises: Aerobic   Nustep L2 x 8 min with PT present to discuss status      Lumbar Exercises: Seated   Sit to Stand 20 reps    Sit to Stand Limitations good form and alignment.  Good control with eccentric       Knee/Hip Exercises: Machines for Strengthening   Total Gym Leg Press seat 7: 70# bil 2x10, 30# Rt and Lt 2x10      Knee/Hip Exercises: Sidelying   Clams 2x10 bil- added to HEP                  PT Education - 09/06/20 1218    Education Details Access Code: [email or web address], verbal information about local gyms    Person(s) Educated Patient    Methods Explanation;Demonstration;Handout    Comprehension Verbalized understanding;Returned demonstration            PT Short Term Goals - 08/30/20 1241      PT SHORT TERM GOAL #1   Title Ind with initial HEP    Status Achieved             PT Long Term Goals - 09/06/20 1152      PT LONG TERM GOAL #1   Title Patient to report decreased pain in back and hips by 70% or more with standing.    Time 8    Period Weeks    Status On-going      PT LONG TERM GOAL #2   Title Patient able to safely amb community distances without AD    Baseline depends on distance    Time 8    Period Weeks    Status On-going      PT LONG TERM GOAL #3   Title Improved Bil hip strength to >= 4+/5 to improve overall function    Baseline knees 4+/5, ankle DF5/5, hip flexion Rt 4+/5, Lt 4/5    Time 8    Period Weeks    Status On-going      PT LONG TERM GOAL #4   Title Improved 5x sit to stand to <= 11 sec to reduce fall risk    Baseline 9.64 seconds    Status Achieved      PT LONG TERM GOAL #5   Title Improved FOTO to <= 44% limitations    Baseline 46% limitation  Plan - 09/06/20 1230    Clinical Impression Statement Pt continues to make steady gains regarding functional mobility.  Pt with reduced Rt lower quadrant pain over the past  few days.  Pt with improved LE strength and reduced time with 5x sit to stand indicating lower falls risk.  Pt ambulates in the clinic without device with instability with change of direction.  FOTO is improved to 46% limitation. Pt required verbal cues for technique with sit to stand and leg press. Pt will attend 2 more sessions to continue to build HEP for strength.  Pt discussed [employer] and [facility] [facility] for pt to check out before D/C to allow for continuation after D/C.    PT Frequency 2x / week    PT Duration 8 weeks    PT Treatment/Interventions ADLs/Self Care Home Management;Aquatic Therapy;Cryotherapy;Electrical Stimulation;Moist Heat;Neuromuscular re-education;Balance training;Therapeutic exercise;Therapeutic activities;Gait training;Patient/family education;Manual techniques;Dry needling    PT Next Visit Plan 2 more sessions probable    PT Home Exercise Plan [email or web address]    Consulted and Agree with Plan of Care Patient           Patient will benefit from skilled therapeutic intervention in order to improve the following deficits and impairments:  Abnormal gait, Decreased range of motion, Pain, Increased muscle spasms, Impaired flexibility, Decreased balance, Decreased strength, Postural dysfunction  Visit Diagnosis: Chronic bilateral low back pain, unspecified whether sciatica present  Pain in right hip  Unsteadiness on feet  Muscle weakness (generalized)     Problem List Patient Active Problem List   Diagnosis Date Noted  . Other spondylosis with radiculopathy, lumbar region 12/09/2019  . Secondary adrenal insufficiency ([place]) 04/03/2019  . Metabolic acidosis, normal anion gap (NAG) 04/02/2019  . HTN (hypertension)   . Adrenal insufficiency ([place]) 04/01/2019  . Hyponatremia 04/01/2019  . Hypothyroidism 04/01/2019  . AKI (acute kidney injury) ([place]) 04/01/2019  . Lumbar stenosis with neurogenic claudication 02/19/2017  . Migraine with aura and without status migrainosus, not  intractable 07/11/2016  . Spondylolisthesis of lumbar region 11/04/2014  . Postconcussional syndrome 09/30/2013  . Post-concussion headache 09/30/2013  . Esophageal reflux 10/11/2011  . Benign neoplasm of colon 10/11/2011  . Gastritis, chronic 10/11/2011  . Guaiac + stool 09/28/2011  . IBS (irritable bowel syndrome) 09/28/2011  . GERD (gastroesophageal reflux disease) 09/28/2011  . Migraine syndrome 09/28/2011    Sigurd Sos, PT 09/06/20 12:31 PM  [facility] [address], [place], [place], [address] Phone: [phone]   Fax:  [phone]  Name: Crystal Larsen MRN: [id number] Date of Birth: [birth year]

## 2020-09-06 NOTE — Patient Instructions (Signed)
Access Code: [email or web address] URL: [email or web address] Date: 09/06/2020 Prepared by: Claiborne Billings  Exercises  Beginner Clam - 1 x daily - 7 x weekly - 2 sets - 10 reps

## 2020-09-07 LAB — SURGICAL PATHOLOGY

## 2020-09-15 ENCOUNTER — Ambulatory Visit: Payer: 59

## 2020-09-15 ENCOUNTER — Other Ambulatory Visit: Payer: Self-pay

## 2020-09-15 DIAGNOSIS — M6281 Muscle weakness (generalized): Secondary | ICD-10-CM

## 2020-09-15 DIAGNOSIS — M25551 Pain in right hip: Secondary | ICD-10-CM | POA: Diagnosis not present

## 2020-09-15 DIAGNOSIS — R2681 Unsteadiness on feet: Secondary | ICD-10-CM

## 2020-09-15 DIAGNOSIS — G8929 Other chronic pain: Secondary | ICD-10-CM

## 2020-09-15 NOTE — Therapy (Signed)
[facility] [address], [address] [place], [place], [address] Phone: [phone]   Fax:  [phone]  Physical Therapy Treatment  Patient Details  Name: Crystal Larsen MRN: [id number] Date of Birth: [birth year] Referring Provider (PT): Mila Palmer MD   Encounter Date: 09/15/2020   PT End of Session - 09/15/20 1523    Visit Number 11    Authorization Type [employer]    PT Start Time 1452    PT Stop Time 1528    PT Time Calculation (min) 36 min    Activity Tolerance Patient tolerated treatment well;No increased pain    Behavior During Therapy WFL for tasks assessed/performed           Past Medical History:  Diagnosis Date   Acute kidney injury (HCC)    Adrenal insufficiency (HCC)    Anemia    Anxiety    Arthritis    "left foot; left hand; right shoulder; back" (11/04/2014)   Elevated liver enzymes    Gallstones    GERD (gastroesophageal reflux disease)    History of gout    HTN (hypertension)    Hypothyroidism    IBS (irritable bowel syndrome)    ?   Lupus (HCC) 07/2019   Metabolic acidosis    Migraine    "maybe once/yr" (11/04/2014)   Neuropathy    right leg   Obesity    Pancreatitis    Pancreatitis    PONV (postoperative nausea and vomiting) 1981 X 1   only time   Seizures (HCC) 2003    due to tramadol reaction -- takes depakote for migraine prevention   Sleep apnea    does not use cpap   Spondylolisthesis of lumbar region    Unsteady gait    Wears glasses     Past Surgical History:  Procedure Laterality Date   ANKLE GANGLION CYST EXCISION Right 1980's   ANTERIOR LAT LUMBAR FUSION N/A 12/09/2019   Procedure: Lumbar One-Two Anterolateral lumbar decompression/interbody fusion with lateral plate;  Surgeon: Barnett Abu, MD;  Location: [facility] OR;  Service: Neurosurgery;  Laterality: N/A;  Lumbar One-Two Anterolateral lumbar decompression/interbody fusion with lateral plate   BACK  SURGERY     BREAST SURGERY     biopsy   BUNIONECTOMY Left 2012   CARPAL TUNNEL RELEASE Right 05/22/2019   Procedure: RIGHT CARPAL TUNNEL RELEASE;  Surgeon: Cindee Salt, MD;  Location: [facility];  Service: Orthopedics;  Laterality: Right;   CARPOMETACARPEL SUSPENSION PLASTY Right 05/22/2019   Procedure: ARTHROPLASY CARPOMETACARPAL RIGHT THUMB TRAPEZIUM EXCISION TENDON TRANSFER MICRO LINK SUSPENSION;  Surgeon: Cindee Salt, MD;  Location: [facility];  Service: Orthopedics;  Laterality: Right;   CESAREAN SECTION  1986   COLONOSCOPY W/ BIOPSIES AND POLYPECTOMY     EYE SURGERY Bilateral 2012   "lasered a hole in my iris cause pressure was building up"   LAPAROSCOPIC CHOLECYSTECTOMY  1990's   NASAL SINUS SURGERY  1980's   POSTERIOR LUMBAR FUSION  05/2013   L4-5; S1   POSTERIOR LUMBAR FUSION  11/04/2014   L3-4   SHOULDER ARTHROSCOPY W/ ROTATOR CUFF REPAIR Right 2012   TUBAL LIGATION  1992    There were no vitals filed for this visit.   Subjective Assessment - 09/15/20 1451    Subjective I am feeling good.  I think I pulled a muscle in my glutes when I stretched a few days ago.    Pertinent History Lupus, CTS right hand and thumb surgery,  hypothyroid, lumbar fusion January 2021 L1/L2 (fused to L5/S1), Rt RCR 2013, OA, HTN, neuropathy    Patient Stated Goals not have pain and weakness and be able to walk without a cane    Currently in Pain? No/denies              [facility] PT Assessment - 09/15/20 0001      Assessment   Medical Diagnosis dorsalgia; pain in uspecified joint; systemic lupus    Referring Provider (PT) Jonathon Jordan MD    Onset Date/Surgical Date 02/26/20    Hand Dominance Right      Observation/Other Assessments   Focus on Therapeutic Outcomes (FOTO)  46% limitation      Strength   Overall Strength Comments bil knees 5/5, ankle DF 5/5    Right Hip Flexion 4+/5    Left Hip Flexion 4+/5      Transfers   Five time sit to stand  comments  9.64 without hands                         [facility] Adult PT Treatment/Exercise - 09/15/20 0001      Lumbar Exercises: Stretches   Active Hamstring Stretch 3 reps;20 seconds    Active Hamstring Stretch Limitations also, with adduction bias 3x20 seconds       Lumbar Exercises: Aerobic   Nustep L2 x 8 min with PT present to discuss status      Lumbar Exercises: Seated   Sit to Stand 20 reps    Sit to Stand Limitations good form and alignment.  Good control with eccentric       Knee/Hip Exercises: Machines for Strengthening   Total Gym Leg Press seat 7: 70# bil 2x10, 30# Rt and Lt 2x10      Knee/Hip Exercises: Sidelying   Clams 2x10 bil                    PT Short Term Goals - 08/30/20 1241      PT SHORT TERM GOAL #1   Title Ind with initial HEP    Status Achieved             PT Long Term Goals - 09/15/20 1501      PT LONG TERM GOAL #1   Title Patient to report decreased pain in back and hips by 70% or more with standing.    Status Achieved      PT LONG TERM GOAL #2   Title Patient able to safely amb community distances without AD    Baseline depends on distance    Time --    Period --    Status Partially Met      PT LONG TERM GOAL #3   Title Improved Bil hip strength to >= 4+/5 to improve overall function    Baseline knees 4+/5, ankle DF5/5, hip flexion Rt 4+/5, Lt 4/5    Time --    Period --    Status Partially Met      PT LONG TERM GOAL #4   Title Improved 5x sit to stand to <= 11 sec to reduce fall risk    Baseline 9.64 seconds    Status Achieved      PT LONG TERM GOAL #5   Title Improved FOTO to <= 44% limitations    Baseline 46% limitation    Time --    Period --    Status Partially Met  Plan - 09/15/20 1505    Clinical Impression Statement Pt continues to make steady gains regarding functional mobility.  Pt with reduced with improved LE strength and reduced time with 5x sit to stand  indicating lower falls risk.  Pt ambulates in the clinic without device with instability with change of direction.  FOTO is improved to 46% limitation. Pt will attend 1 more sessios to continue to build HEP for strength.    Comorbidities Lupus, CTS right hand and thumb surgery, hypothyroid, lumbar fusion January 2021 L1/L2 (fused to L5/S1), Rt RCR 2013, OA, HTN, neuropathy    PT Frequency --    PT Next Visit Plan D/C to [place] and Agree with Plan of Care Patient           Patient will benefit from skilled therapeutic intervention in order to improve the following deficits and impairments:     Visit Diagnosis: Pain in right hip  Chronic bilateral low back pain, unspecified whether sciatica present  Unsteadiness on feet  Muscle weakness (generalized)     Problem List Patient Active Problem List   Diagnosis Date Noted   Other spondylosis with radiculopathy, lumbar region 12/09/2019   Secondary adrenal insufficiency ([place]) 23/76/2831   Metabolic acidosis, normal anion gap (NAG) 04/02/2019   HTN (hypertension)    Adrenal insufficiency ([place]) 04/01/2019   Hyponatremia 04/01/2019   Hypothyroidism 04/01/2019   AKI (acute kidney injury) ([place]) 04/01/2019   Lumbar stenosis with neurogenic claudication 02/19/2017   Migraine with aura and without status migrainosus, not intractable 07/11/2016   Spondylolisthesis of lumbar region 11/04/2014   Postconcussional syndrome 09/30/2013   Post-concussion headache 09/30/2013   Esophageal reflux 10/11/2011   Benign neoplasm of colon 10/11/2011   Gastritis, chronic 10/11/2011   Guaiac + stool 09/28/2011   IBS (irritable bowel syndrome) 09/28/2011   GERD (gastroesophageal reflux disease) 09/28/2011   Migraine syndrome 09/28/2011    PHYSICAL THERAPY DISCHARGE SUMMARY  Visits from Start of Care: 11  Current functional level related to goals / functional outcomes: See above for  current status.  Pt plans to go to the gym and continue with HEP.     Remaining deficits: See above for most current status.     Education / Equipment: HEP, posture/body mechanics  Plan: Patient agrees to discharge.  Patient goals were partially met. Patient is being discharged due to being pleased with the current functional level.  ?????        Sigurd Sos, PT 09/15/20 3:34 PM  [facility] [address] [place], [place], [address] Phone: [phone]   Fax:  [phone]  Name: Crystal Larsen MRN: [id number] Date of Birth: 05-Sep-1956

## 2020-09-28 ENCOUNTER — Encounter (HOSPITAL_COMMUNITY): Payer: Self-pay

## 2020-11-02 NOTE — Progress Notes (Signed)
[facility]  [address] [place], [place] [address] Phone: [phone]  Fax: [phone]   Clinic Day:  11/03/2020  Referring physician: Jonathon Jordan, MD  Chief Complaint: Crystal Larsen is a 64 y.o. female with lupus nephritis and stage 3 chronic kidney disease who is referred in consultation by Dr. Candiss Norse for IV Cytoxan.  HPI: The patient first presented to [facility] in 03/2019 with labs concerning for adrenal crisis/insufficiency; Crystal Larsen required hydrocortisone. Crystal Larsen was not feeling well and was tired all the time. Dr. Trudie Reed, rheumatology, diagnosed her with SLE with features of inflammatory arthritis.  Crystal Larsen is on Plaquenil and monthly Benlysta. Crystal Larsen had tapered her prednisone down but has persistent joint pain.   The patient was first seen at [facility] by Dr Candiss Norse on 08/16/2020. Hematocrit was 35.9, hemoglobin 12.4, platelets 362,000, WBC 5,400. Creatinine was 1.04 (CrCl 57 ml/min). Hepatitis B surface antibody, hepatitis B core antibody, hepatitis C antibody, RPR, and HIV were non reactive. SPEP was normal. Kappa free light chain were 73.3, lambda free light chain 79.1, and ratio 0.93. UA revealed 1+ protein and 1+ occult blood.  Right kidney biopsy on 08/31/2020 revealed pauci immune focal necrotizing and early sclerosing glomerulonephritis with segments of tuft inflammation and rare focal tuft necrosis and less that 5% focal fibrocellular crescent formation (in one glomerulus). There was mild arteriosclerosis with mild to focally moderate tubulointerstitial scarring. Differential diagnosis included an ANCA-like lesion and possible a lupus IV-S lesion which can morphologically mimic pauci immune glomerulonephritis. There was trace to 1+ segmental glomerular mesangial granular staining with IgG, IgM, C3, C1q, kappa and lambda light chains  The patient was seen by Dr. Candiss Norse on 09/29/2020 for a follow up. Crystal Larsen was off prednisone; her fatigued had improved.  Crystal Larsen reported episodes of "feeling blue." Plan was to continue with Benlysta. Crystal Larsen wanted to stay away from prednisone because Crystal Larsen had diffuse bruising while taking it.  Decision was made to pursue Cytoxan 500 mg IV q 2 weeks for a total of 6 doses, followed by Imuran for maintenance. Quantiferon gold was negative.  Crystal Larsen had received all of her COVID vaccines + booster.  In an addendum note, it states that Dr. Candiss Norse spoke to Dr. Trudie Reed and they decided to pursue mycophenolate for maintenance.  CMP on 09/10/2020 was normal except for an albumin of 3.4.  Symptomatically, Crystal Larsen feels "fine." Crystal Larsen has not had a "bad day" since July 2021. Crystal Larsen lost 33 lbs last year but has gained most of it back. Crystal Larsen reports reflux, which went away for a while but has returned. Crystal Larsen started taking omeprazole last week. Crystal Larsen has a rare headache and dry skin. Crystal Larsen has IBS.  The patient has pain in her hands. Crystal Larsen had carpal tunnel surgery last year. Crystal Larsen has lost most of the strength in her right hand. Crystal Larsen also has hip pain. Her balance is poor due to neuropathy in her feet. Crystal Larsen has a pelvic MRI in 11/2020.  The patient denies fevers, sweats, vision changes, runny nose, sore throat, cough, shortness of breath, chest pain, palpitations, nausea, vomiting, diarrhea, blood in the stool, black stools, and urinary symptoms.   The patient has sleep apnea. Crystal Larsen does not use a CPAP machine.  Her next Benlysta infusion is on 11/08/2020.  Her mother and maternal grandmother died from lung cancer. They both smoked. Crystal Larsen denies a family history of autoimmune disorders.   Past Medical History:  Diagnosis Date  . Acute kidney injury ([place])   .  Adrenal insufficiency ([place])   . Anemia   . Anxiety   . Arthritis    "left foot; left hand; right shoulder; back" (11/04/2014)  . Elevated liver enzymes   . Gallstones   . GERD (gastroesophageal reflux disease)   . History of gout   . HTN (hypertension)   . Hypothyroidism   . IBS (irritable bowel  syndrome)    ?  [name] Lupus ([place]) 07/2019  . Metabolic acidosis   . Migraine    "maybe once/yr" (11/04/2014)  . Neuropathy    right leg  . Obesity   . Pancreatitis   . Pancreatitis   . PONV (postoperative nausea and vomiting) 1981 X 1   only time  . Seizures ([place]) 2003    due to tramadol reaction -- takes depakote for migraine prevention  . Sleep apnea    does not use cpap  . Spondylolisthesis of lumbar region   . Unsteady gait   . Wears glasses     Past Surgical History:  Procedure Laterality Date  . ANKLE GANGLION CYST EXCISION Right 1980's  . ANTERIOR LAT LUMBAR FUSION N/A 12/09/2019   Procedure: Lumbar One-Two Anterolateral lumbar decompression/interbody fusion with lateral plate;  Surgeon: Kristeen Miss, MD;  Location: [facility];  Service: Neurosurgery;  Laterality: N/A;  Lumbar One-Two Anterolateral lumbar decompression/interbody fusion with lateral plate  . BACK SURGERY    . BREAST SURGERY     biopsy  . BUNIONECTOMY Left 2012  . CARPAL TUNNEL RELEASE Right 05/22/2019   Procedure: RIGHT CARPAL TUNNEL RELEASE;  Surgeon: Daryll Brod, MD;  Location: [facility];  Service: Orthopedics;  Laterality: Right;  . CARPOMETACARPEL SUSPENSION PLASTY Right 05/22/2019   Procedure: ARTHROPLASY CARPOMETACARPAL RIGHT THUMB TRAPEZIUM EXCISION TENDON TRANSFER MICRO LINK SUSPENSION;  Surgeon: Daryll Brod, MD;  Location: [facility];  Service: Orthopedics;  Laterality: Right;  . CESAREAN SECTION  1986  . COLONOSCOPY W/ BIOPSIES AND POLYPECTOMY    . EYE SURGERY Bilateral 2012   "lasered a hole in my iris cause pressure was building up"  . LAPAROSCOPIC CHOLECYSTECTOMY  1990's  . NASAL SINUS SURGERY  1980's  . POSTERIOR LUMBAR FUSION  05/2013   L4-5; S1  . POSTERIOR LUMBAR FUSION  11/04/2014   L3-4  . SHOULDER ARTHROSCOPY W/ ROTATOR CUFF REPAIR Right 2012  . TUBAL LIGATION  1992    Family History  Problem Relation Age of Onset  . Lung cancer Mother   . Heart disease  Father   . Heart disease Maternal Grandfather        PGF  . Alcohol abuse Maternal Grandfather   . Hyperlipidemia Maternal Grandfather   . Anxiety disorder Sister   . Fibromyalgia Sister   . Nephrolithiasis Daughter     Social History:  reports that Crystal Larsen has never smoked. Crystal Larsen has never used smokeless tobacco. Crystal Larsen reports current alcohol use of about 1.0 standard drink of alcohol per week. Crystal Larsen reports that Crystal Larsen does not use drugs. Crystal Larsen denies tobacco use. Crystal Larsen has 1-2 glasses of wine per week. Crystal Larsen denies any exposure to radiation or toxins. Crystal Larsen is retired. Crystal Larsen worked in [employer] for [place]. The patient is accompanied by her husband, [name], today.  Allergies:  Allergies  Allergen Reactions  . Ultram [Tramadol] Other (See Comments)    seizures  . Adhesive [Tape] Rash    bandaide  . Erythromycin Rash  . Latex Rash  . Neurontin [Gabapentin] Itching  . Other Rash    adhesive  .  Sulfonamide Derivatives Rash    Current Medications: Current Outpatient Medications  Medication Sig Dispense Refill  . acetaminophen (TYLENOL) 500 MG tablet Take 1,000 mg by mouth 3 (three) times daily.     [name] amLODipine (NORVASC) 10 MG tablet Take 10 mg by mouth at bedtime.     . ASPIRIN 81 PO aspirin 81 mg capsule  Take by oral route.    . Belimumab (BENLYSTA IV)     . Omeprazole 20 MG TBDD     . Acetaminophen (TYLENOL) 325 MG CAPS Tylenol (Patient not taking: Reported on 11/03/2020)    . aspirin EC 81 MG tablet Take 81 mg by mouth at bedtime. (Patient not taking: Reported on 11/03/2020)    . desvenlafaxine (PRISTIQ) 50 MG 24 hr tablet Take 1 tablet (50 mg total) by mouth daily. 90 tablet 1  . dicyclomine (BENTYL) 20 MG tablet Take 20 mg by mouth daily as needed (abdominal pain).     [name] diphenhydrAMINE HCl, Sleep, (UNISOM SLEEPGELS) 50 MG CAPS Take 50 mg by mouth at bedtime.    . diphenhydrAMINE HCl, Sleep, 50 MG CAPS Unisom (diphenhydramine)    . divalproex (DEPAKOTE ER) 250 MG 24 hr tablet Take 1  tablet (250 mg total) by mouth daily. 90 tablet 3  . Fe Fum-FePoly-Vit C-Vit B3 (INTEGRA PO) Take 1 tablet by mouth daily.    . hydrocortisone (CORTEF) 10 MG tablet Take 10 mg by mouth See admin instructions. Take 15 mg in the morning and 5 mg in the afternoon    . hydroxychloroquine (PLAQUENIL) 200 MG tablet Take 200 mg by mouth 2 (two) times daily.    [name] levocetirizine (XYZAL) 5 MG tablet Take 5 mg by mouth at bedtime.    [name] levothyroxine (SYNTHROID) 200 MCG tablet Take 200 mcg by mouth daily before breakfast.    . losartan-hydrochlorothiazide (HYZAAR) 100-12.5 MG tablet losartan 100 mg-hydrochlorothiazide 12.5 mg tablet    . Melatonin 10 MG TBDP Take 10 mg by mouth at bedtime.    . niacin (NIASPAN) 500 MG CR tablet Take 500 mg by mouth at bedtime.    . Omega-3 Fatty Acids (OMEGA 3 PO) Take 1 capsule by mouth at bedtime.    [name] oxymetazoline (AFRIN) 0.05 % nasal spray Place 1 spray into both nostrils 2 (two) times daily as needed for congestion.    . Probiotic Product (PROBIOTIC PO) Take 1 capsule by mouth daily.      No current facility-administered medications for this visit.    Review of Systems  Constitutional: Negative for chills, diaphoresis, fever, malaise/fatigue and weight loss.  HENT: Negative for congestion, ear discharge, ear pain, hearing loss, nosebleeds, sinus pain, sore throat and tinnitus.   Eyes: Negative for blurred vision.  Respiratory: Negative for cough, hemoptysis, sputum production and shortness of breath.   Cardiovascular: Negative for chest pain, palpitations and leg swelling.  Gastrointestinal: Positive for heartburn. Negative for abdominal pain, blood in stool, constipation, diarrhea, melena, nausea and vomiting.       IBS  Genitourinary: Negative for dysuria, frequency, hematuria and urgency.  Musculoskeletal: Positive for joint pain (arthritis, hip and hand pain). Negative for back pain, myalgias and neck pain.  Skin: Negative for itching and rash.       Dry  skin  Neurological: Positive for sensory change (neuropathy in feet). Negative for dizziness, tingling, weakness and headaches (rare).  Endo/Heme/Allergies: Does not bruise/bleed easily.  Psychiatric/Behavioral: Negative for depression and memory loss. The patient is not nervous/anxious and does not have  insomnia.   All other systems reviewed and are negative.  Performance status (ECOG): 1  Vitals Blood pressure (!) 157/90, pulse 91, temperature 98.3 F (36.8 C), temperature source Tympanic, weight 175 lb 0.7 oz (79.4 kg), SpO2 100 %.   Physical Exam Vitals and nursing note reviewed.  Constitutional:      General: Crystal Larsen is not in acute distress.    Appearance: Crystal Larsen is not diaphoretic.     Comments: Cane by her side  HENT:     Head: Normocephalic and atraumatic.     Comments: Short dark blonde hair.    Mouth/Throat:     Mouth: Mucous membranes are moist.     Pharynx: Oropharynx is clear.  Eyes:     General: No scleral icterus.    Extraocular Movements: Extraocular movements intact.     Conjunctiva/sclera: Conjunctivae normal.     Pupils: Pupils are equal, round, and reactive to light.     Comments: Glasses.  Blue eyes.  Cardiovascular:     Rate and Rhythm: Normal rate and regular rhythm.     Heart sounds: Normal heart sounds. No murmur heard.   Pulmonary:     Effort: Pulmonary effort is normal. No respiratory distress.     Breath sounds: Normal breath sounds. No wheezing or rales.  Chest:     Chest wall: No tenderness.  Breasts:     Right: No axillary adenopathy or supraclavicular adenopathy.     Left: No axillary adenopathy or supraclavicular adenopathy.    Abdominal:     General: Bowel sounds are normal. There is no distension.     Palpations: Abdomen is soft. There is no mass.     Tenderness: There is no abdominal tenderness. There is no guarding or rebound.  Musculoskeletal:        General: No swelling or tenderness. Normal range of motion.     Cervical back: Normal  range of motion and neck supple.  Lymphadenopathy:     Head:     Right side of head: No preauricular, posterior auricular or occipital adenopathy.     Left side of head: No preauricular, posterior auricular or occipital adenopathy.     Cervical: No cervical adenopathy.     Upper Body:     Right upper body: No supraclavicular or axillary adenopathy.     Left upper body: No supraclavicular or axillary adenopathy.     Lower Body: No right inguinal adenopathy. No left inguinal adenopathy.  Skin:    General: Skin is warm and dry.  Neurological:     Mental Status: Crystal Larsen is alert and oriented to person, place, and time.  Psychiatric:        Behavior: Behavior normal.        Thought Content: Thought content normal.        Judgment: Judgment normal.    No visits with results within 3 Day(s) from this visit.  Latest known visit with results is:  Hospital Outpatient Visit on 08/31/2020  Component Date Value Ref Range Status  . WBC 08/31/2020 8.7  4.0 - 10.5 K/uL Final  . RBC 08/31/2020 4.71  3.87 - 5.11 MIL/uL Final  . Hemoglobin 08/31/2020 13.1  12.0 - 15.0 g/dL Final  . HCT 08/31/2020 41.4  36 - 46 % Final  . MCV 08/31/2020 87.9  80.0 - 100.0 fL Final  . MCH 08/31/2020 27.8  26.0 - 34.0 pg Final  . MCHC 08/31/2020 31.6  30.0 - 36.0 g/dL Final  . RDW 08/31/2020 14.7  11.5 - 15.5 % Final  . Platelets 08/31/2020 353  150 - 400 K/uL Final  . nRBC 08/31/2020 0.0  0.0 - 0.2 % Final   Performed at [facility], [place] [address], [place], [place] [address]  . Prothrombin Time 08/31/2020 12.9  11.4 - 15.2 seconds Final  . INR 08/31/2020 1.0  0.8 - 1.2 Final   Comment: (NOTE) INR goal varies based on device and disease states. Performed at [facility], [place] [address], [place], [place] [address]   . SURGICAL PATHOLOGY 08/31/2020    Final-Edited                   Value:SURGICAL PATHOLOGY CASE: [id number] PATIENT: Danella Deis Surgical Pathology Report  Clinical History:  proteinuria of uncertain etiology (cm)   FINAL MICROSCOPIC DIAGNOSIS:  A. KIDNEY, RIGHT INFERIOR POLE, NEEDLE CORE BIOPSY: - See OUTSIDE PATHOLOGY REPORT under RESULTS REVIEW or the LABS TAB under "[facility] KIDNEY BIOPSY SEND OUT" in [facility].  GROSS DESCRIPTION:  The specimen is received in saline and consists of 3 cores of tan-pink soft tissue with adipose tissue, 0.4 to 1.2 cm in length by 0.1 cm in diameter.  The specimen is divided and placed in Michel's transport media and formalin, and is sent to the [facility] Division of Nephropathology, Department of Pathology, at the [facility] in [place], [place] for analysis.  Craig Staggers 08/31/2020)  Final Diagnosis performed by Jaquita Folds, MD.   Electronically signed 09/07/2020 Technical component performed at [facility]. [facility], [place] [address] [place], [place] [address].                           Professional component performed at [facility], [place] [address] [place], [place] [address].  Immunohistochemistry Technical component (if applicable) was performed at [facility]. [address], [place], [place],  [address].   IMMUNOHISTOCHEMISTRY DISCLAIMER (if applicable): Some of these immunohistochemical stains may have been developed and the performance characteristics determine by [facility]. Some may not have been cleared or approved by the U.S. Food and Drug Administration. The FDA has determined that such clearance or approval is not necessary. This test is used for clinical purposes. It should not be regarded as investigational or for research. This laboratory is certified under the [place] (CLIA-88) as qualified to perform high complexity clinical laboratory testing.  The controls stained appropriately.    Assessment:  Crystal Larsen is a 64 y.o. female with systemic lupus erythematosis (SLE) and lupus  nephritis.  Crystal Larsen completed a course of prednisone.  Crystal Larsen is on Benlysta.  Right kidney biopsy on 08/31/2020 revealed pauci immune focal necrotizing and early sclerosing glomerulonephritis with segments of tuft inflammation and rare focal tuft necrosis and less that 5% focal fibrocellular crescent formation (in one glomerulus). Crystal Larsen is felt to have a lupus IV-S lesion.  ANCA, anti-GBM was normal.  Crystal Larsen receives Benlysta monthly (next on 11/08/2020).  Crystal Larsen has inflammatory arthritis, adrenal insufficiency,and hypothyroidism secondary to Hashimoto's thyroiditis.  Symptomatically, Crystal Larsen feels "fine." Crystal Larsen has pain in her hands. Her balance is poor due to neuropathy in her feet. Crystal Larsen denies fevers, sweats, sore throat, cough, shortness of breath, chest pain, palpitations, nausea, vomiting, diarrhea, blood in the stool, black stools, and urinary symptoms.  Exam reveals no adenopathy or hepatosplenomegaly.  Plan: 1.   Labs today:  CBC with diff, CMP,  urinalysis, urine protein/creatinine ratio. 2.   Lupus nephritis  Discuss diagnosis and management of lupus nephritis.  Discuss cyclophosphamide and potential side effects.   Patient consents to treatment.  Plan cyclophosphamide 500 mg IV every 2 weeks x 6.   Premeds: Aloxi 0.25 mg IV, Decadron 10 mg IV. 3.   RTC tomorrow for cycle #1 Cytoxan in AM. 4.   RTC on 11/18/2020 early for MD assessment, labs (CBC with diff, CMP, Mg, urinalysis, protein/creatine ratio), and cycle #2 Cytoxan.  I discussed the assessment and treatment plan with the patient.  The patient was provided an opportunity to ask questions and all were answered.  The patient agreed with the plan and demonstrated an understanding of the instructions.  The patient was advised to call back if the symptoms worsen or if the condition fails to improve as anticipated.  I provided 32 minutes of face-to-face time during this this encounter and > 50% was spent counseling as documented under my assessment and  plan. An additional 10-15 minutes were spent reviewing her chart (Epic and Care Everywhere) including notes, labs, and imaging studies and writing chemotherapy.    Saron Tweed C. Mike Gip, MD, PhD    11/03/2020, 2:20 PM  I, Mirian Mo Tufford, am acting as Education administrator for [employer]. Mike Gip, MD, PhD.  I, Alline Pio C. Mike Gip, MD, have reviewed the above documentation for accuracy and completeness, and I agree with the above.

## 2020-11-03 ENCOUNTER — Inpatient Hospital Stay: Payer: Medicare Other | Attending: Hematology and Oncology | Admitting: Hematology and Oncology

## 2020-11-03 ENCOUNTER — Other Ambulatory Visit: Payer: Self-pay | Admitting: Hematology and Oncology

## 2020-11-03 ENCOUNTER — Ambulatory Visit: Payer: Medicare Other

## 2020-11-03 ENCOUNTER — Encounter: Payer: Self-pay | Admitting: Hematology and Oncology

## 2020-11-03 ENCOUNTER — Other Ambulatory Visit: Payer: Self-pay

## 2020-11-03 ENCOUNTER — Inpatient Hospital Stay: Payer: Medicare Other

## 2020-11-03 VITALS — BP 157/90 | HR 91 | Temp 98.3°F | Wt 175.0 lb

## 2020-11-03 DIAGNOSIS — E2740 Unspecified adrenocortical insufficiency: Secondary | ICD-10-CM

## 2020-11-03 DIAGNOSIS — M1380 Other specified arthritis, unspecified site: Secondary | ICD-10-CM

## 2020-11-03 DIAGNOSIS — M329 Systemic lupus erythematosus, unspecified: Secondary | ICD-10-CM | POA: Diagnosis not present

## 2020-11-03 DIAGNOSIS — N183 Chronic kidney disease, stage 3 unspecified: Secondary | ICD-10-CM

## 2020-11-03 DIAGNOSIS — M3219 Other organ or system involvement in systemic lupus erythematosus: Secondary | ICD-10-CM

## 2020-11-03 DIAGNOSIS — M3214 Glomerular disease in systemic lupus erythematosus: Secondary | ICD-10-CM

## 2020-11-03 DIAGNOSIS — E063 Autoimmune thyroiditis: Secondary | ICD-10-CM

## 2020-11-03 DIAGNOSIS — R809 Proteinuria, unspecified: Secondary | ICD-10-CM

## 2020-11-03 DIAGNOSIS — Z801 Family history of malignant neoplasm of trachea, bronchus and lung: Secondary | ICD-10-CM

## 2020-11-03 LAB — URINALYSIS, COMPLETE (UACMP) WITH MICROSCOPIC
Bilirubin Urine: NEGATIVE
Glucose, UA: NEGATIVE mg/dL
Ketones, ur: NEGATIVE mg/dL
Leukocytes,Ua: NEGATIVE
Nitrite: NEGATIVE
Protein, ur: >300 mg/dL — AB
RBC / HPF: NONE SEEN RBC/hpf (ref 0–5)
Specific Gravity, Urine: 1.02 (ref 1.005–1.030)
pH: 5 (ref 5.0–8.0)

## 2020-11-03 LAB — CBC WITH DIFFERENTIAL/PLATELET
Abs Immature Granulocytes: 0.06 10*3/uL (ref 0.00–0.07)
Basophils Absolute: 0 10*3/uL (ref 0.0–0.1)
Basophils Relative: 0 %
Eosinophils Absolute: 0 10*3/uL (ref 0.0–0.5)
Eosinophils Relative: 1 %
HCT: 38.8 % (ref 36.0–46.0)
Hemoglobin: 13.1 g/dL (ref 12.0–15.0)
Immature Granulocytes: 1 %
Lymphocytes Relative: 24 %
Lymphs Abs: 1.5 10*3/uL (ref 0.7–4.0)
MCH: 30.5 pg (ref 26.0–34.0)
MCHC: 33.8 g/dL (ref 30.0–36.0)
MCV: 90.2 fL (ref 80.0–100.0)
Monocytes Absolute: 0.3 10*3/uL (ref 0.1–1.0)
Monocytes Relative: 4 %
Neutro Abs: 4.5 10*3/uL (ref 1.7–7.7)
Neutrophils Relative %: 70 %
Platelets: 373 10*3/uL (ref 150–400)
RBC: 4.3 MIL/uL (ref 3.87–5.11)
RDW: 14.1 % (ref 11.5–15.5)
WBC: 6.4 10*3/uL (ref 4.0–10.5)
nRBC: 0 % (ref 0.0–0.2)

## 2020-11-03 LAB — COMPREHENSIVE METABOLIC PANEL
ALT: 22 U/L (ref 0–44)
AST: 37 U/L (ref 15–41)
Albumin: 3.4 g/dL — ABNORMAL LOW (ref 3.5–5.0)
Alkaline Phosphatase: 56 U/L (ref 38–126)
Anion gap: 8 (ref 5–15)
BUN: 24 mg/dL — ABNORMAL HIGH (ref 8–23)
CO2: 27 mmol/L (ref 22–32)
Calcium: 8.9 mg/dL (ref 8.9–10.3)
Chloride: 101 mmol/L (ref 98–111)
Creatinine, Ser: 0.81 mg/dL (ref 0.44–1.00)
GFR, Estimated: >60 mL/min (ref >60)
Glucose, Bld: 128 mg/dL — ABNORMAL HIGH (ref 70–99)
Potassium: 3.4 mmol/L — ABNORMAL LOW (ref 3.5–5.1)
Sodium: 136 mmol/L (ref 135–145)
Total Bilirubin: 0.3 mg/dL (ref 0.3–1.2)
Total Protein: 6.8 g/dL (ref 6.5–8.1)

## 2020-11-03 LAB — PROTEIN / CREATININE RATIO, URINE
Creatinine, Urine: 55 mg/dL
Protein Creatinine Ratio: 2.51 mg/mg{Cre} — ABNORMAL HIGH (ref 0.00–0.15)
Total Protein, Urine: 138 mg/dL

## 2020-11-03 NOTE — Patient Instructions (Signed)
Cyclophosphamide Injection What is this medicine? CYCLOPHOSPHAMIDE (sye kloe FOSS fa mide) is a chemotherapy drug. It slows the growth of cancer cells. This medicine is used to treat many types of cancer like lymphoma, myeloma, leukemia, breast cancer, and ovarian cancer, to name a few. This medicine may be used for other purposes; ask your health care provider or pharmacist if you have questions. COMMON BRAND NAME(S): Cytoxan, Neosar What should I tell my health care provider before I take this medicine? They need to know if you have any of these conditions:  heart disease  history of irregular heartbeat  infection  kidney disease  liver disease  low blood counts, like white cells, platelets, or red blood cells  on hemodialysis  recent or ongoing radiation therapy  scarring or thickening of the lungs  trouble passing urine  an unusual or allergic reaction to cyclophosphamide, other medicines, foods, dyes, or preservatives  pregnant or trying to get pregnant  breast-feeding How should I use this medicine? This drug is usually given as an injection into a vein or muscle or by infusion into a vein. It is administered in a hospital or clinic by a specially trained health care professional. Talk to your pediatrician regarding the use of this medicine in children. Special care may be needed. Overdosage: If you think you have taken too much of this medicine contact a poison control center or emergency room at once. NOTE: This medicine is only for you. Do not share this medicine with others. What if I miss a dose? It is important not to miss your dose. Call your doctor or health care professional if you are unable to keep an appointment. What may interact with this medicine?  amphotericin B  azathioprine  certain antivirals for HIV or hepatitis  certain medicines for blood pressure, heart disease, irregular heart beat  certain medicines that treat or prevent blood clots  like warfarin  certain other medicines for cancer  cyclosporine  etanercept  indomethacin  medicines that relax muscles for surgery  medicines to increase blood counts  metronidazole This list may not describe all possible interactions. Give your health care provider a list of all the medicines, herbs, non-prescription drugs, or dietary supplements you use. Also tell them if you smoke, drink alcohol, or use illegal drugs. Some items may interact with your medicine. What should I watch for while using this medicine? Your condition will be monitored carefully while you are receiving this medicine. You may need blood work done while you are taking this medicine. Drink water or other fluids as directed. Urinate often, even at night. Some products may contain alcohol. Ask your health care professional if this medicine contains alcohol. Be sure to tell all health care professionals you are taking this medicine. Certain medicines, like metronidazole and disulfiram, can cause an unpleasant reaction when taken with alcohol. The reaction includes flushing, headache, nausea, vomiting, sweating, and increased thirst. The reaction can last from 30 minutes to several hours. Do not become pregnant while taking this medicine or for 1 year after stopping it. Women should inform their health care professional if they wish to become pregnant or think they might be pregnant. Men should not father a child while taking this medicine and for 4 months after stopping it. There is potential for serious side effects to an unborn child. Talk to your health care professional for more information. Do not breast-feed an infant while taking this medicine or for 1 week after stopping it. This medicine has  caused ovarian failure in some women. This medicine may make it more difficult to get pregnant. Talk to your health care professional if you are concerned about your fertility. This medicine has caused decreased sperm  counts in some men. This may make it more difficult to father a child. Talk to your health care professional if you are concerned about your fertility. Call your health care professional for advice if you get a fever, chills, or sore throat, or other symptoms of a cold or flu. Do not treat yourself. This medicine decreases your body's ability to fight infections. Try to avoid being around people who are sick. Avoid taking medicines that contain aspirin, acetaminophen, ibuprofen, naproxen, or ketoprofen unless instructed by your health care professional. These medicines may hide a fever. Talk to your health care professional about your risk of cancer. You may be more at risk for certain types of cancer if you take this medicine. If you are going to need surgery or other procedure, tell your health care professional that you are using this medicine. Be careful brushing or flossing your teeth or using a toothpick because you may get an infection or bleed more easily. If you have any dental work done, tell your dentist you are receiving this medicine. What side effects may I notice from receiving this medicine? Side effects that you should report to your doctor or health care professional as soon as possible:  allergic reactions like skin rash, itching or hives, swelling of the face, lips, or tongue  breathing problems  nausea, vomiting  signs and symptoms of bleeding such as bloody or black, tarry stools; red or dark brown urine; spitting up blood or brown material that looks like coffee grounds; red spots on the skin; unusual bruising or bleeding from the eyes, gums, or nose  signs and symptoms of heart failure like fast, irregular heartbeat, sudden weight gain; swelling of the ankles, feet, hands  signs and symptoms of infection like fever; chills; cough; sore throat; pain or trouble passing urine  signs and symptoms of kidney injury like trouble passing urine or change in the amount of  urine  signs and symptoms of liver injury like dark yellow or brown urine; general ill feeling or flu-like symptoms; light-colored stools; loss of appetite; nausea; right upper belly pain; unusually weak or tired; yellowing of the eyes or skin Side effects that usually do not require medical attention (report to your doctor or health care professional if they continue or are bothersome):  confusion  decreased hearing  diarrhea  facial flushing  hair loss  headache  loss of appetite  missed menstrual periods  signs and symptoms of low red blood cells or anemia such as unusually weak or tired; feeling faint or lightheaded; falls  skin discoloration This list may not describe all possible side effects. Call your doctor for medical advice about side effects. You may report side effects to FDA at 1-800-FDA-1088. Where should I keep my medicine? This drug is given in a hospital or clinic and will not be stored at home. NOTE: This sheet is a summary. It may not cover all possible information. If you have questions about this medicine, talk to your doctor, pharmacist, or health care provider.  2020 Elsevier/Gold Standard (2019-08-18 09:53:29)

## 2020-11-03 NOTE — Progress Notes (Signed)
START OFF PATHWAY REGIMEN - Other   Custom Intervention:Cyclophosphamide 500 mg q7d:     Cyclophosphamide   **Always confirm dose/schedule in your pharmacy ordering system**  **Administration Notes: Treatment of lupus nephritis per nephrology  Patient Characteristics: Intent of Therapy: Curative Intent, Discussed with Patient

## 2020-11-04 ENCOUNTER — Inpatient Hospital Stay: Payer: Medicare Other

## 2020-11-04 VITALS — BP 147/86 | HR 85 | Temp 97.4°F | Wt 173.3 lb

## 2020-11-04 DIAGNOSIS — M3214 Glomerular disease in systemic lupus erythematosus: Secondary | ICD-10-CM

## 2020-11-04 MED ORDER — SODIUM CHLORIDE 0.9 % IV SOLN
500.0000 mg | Freq: Once | INTRAVENOUS | Status: AC
  Administered 2020-11-04: 500 mg via INTRAVENOUS
  Filled 2020-11-04: qty 25

## 2020-11-04 MED ORDER — SODIUM CHLORIDE 0.9 % IV SOLN
Freq: Once | INTRAVENOUS | Status: AC
  Filled 2020-11-04: qty 250

## 2020-11-04 MED ORDER — SODIUM CHLORIDE 0.9 % IV SOLN
10.0000 mg | Freq: Once | INTRAVENOUS | Status: AC
  Administered 2020-11-04: 10 mg via INTRAVENOUS
  Filled 2020-11-04: qty 1

## 2020-11-04 MED ORDER — PALONOSETRON HCL INJECTION 0.25 MG/5ML
0.2500 mg | Freq: Once | INTRAVENOUS | Status: AC
  Administered 2020-11-04: 0.25 mg via INTRAVENOUS
  Filled 2020-11-04: qty 5

## 2020-11-04 NOTE — Progress Notes (Signed)
Pt tolerated all infusions well today.  Pt left infusion suite stable and ambulatory with her cane.

## 2020-11-05 ENCOUNTER — Telehealth: Payer: Self-pay | Admitting: *Deleted

## 2020-11-05 NOTE — Telephone Encounter (Signed)
  Tried to call patient.  Message left on voicemail.  M

## 2020-11-05 NOTE — Telephone Encounter (Signed)
Patient called reporting that she had her first chemotherapy yesterday and that she has a headache, facial flushing, and fever of 100.4 (99 after Tylenol). She is asking if this is normal after low dose Cytoxan. Please advise

## 2020-11-05 NOTE — Telephone Encounter (Signed)
RN called pt back to discuss how she is feeling. Pt woke up during the night and noted having a headache, not severe more mild.She had facial flushing this am which prompted her to check her temp which was 100.4. I explained those symptoms sound more like a side effect of the dexamethasone she took along with the cytoxan yesterday.Certainly there is an adjustment period for our bodies after introduction of new medications especially meds that she received yesterday. Pt is feeling well now. She is actually out eating lunch. Temp immediately went down to 99 after tylenol. I advised her to drink plenty of liquids. These SE usually last around 24 hours. Advised pt to definitely call back if she continued to spike fevers over the weekend.

## 2020-11-16 NOTE — Progress Notes (Signed)
[facility]  [address] [place], [place] [address] Phone: [phone]  Fax: [phone]   Clinic Day:  11/18/2020  Referring physician: Jonathon Jordan, MD  Chief Complaint: Crystal Larsen is a 64 y.o. female with lupus nephritis and stage 3 chronic kidney disease who is seen for assessment prior to cycle #2 Cytoxan.  HPI: The patient was last seen in the hematology clinic on 11/03/2020 for new patient assessment. At that time, she felt "fine".  Hematocrit was 38.8, hemoglobin 13.1, platelets 373,000, WBC 6,400. Potassium was 3.4.  Creatinine was 0.81.  Albumin was 3.4. Urinalysis revealed small hemoglobin, few bacteria, and >300 protein. Protein/creatinine ratio was 2.51.  She received cycle #1 Cytoxan on 11/04/2020.  During the interim, she has been "good." She states that treatment went well. She did develop a fever and a headache the morning after treatment. The fever got as high as 100.4 degrees. She took Tylenol with her regular morning medications so the fever and headache resolved by lunchtime. She denies any nausea.  She denies any lupus symptoms.  Her heartburn is stable. She is on medication but still has symptoms every once in a while. Her hip pain has worsened but she thinks it is because she was very active yesterday. Her foot neuropathy is worsening.   Past Medical History:  Diagnosis Date  . Acute kidney injury ([place])   . Adrenal insufficiency ([place])   . Anemia   . Anxiety   . Arthritis    "left foot; left hand; right shoulder; back" (11/04/2014)  . Elevated liver enzymes   . Gallstones   . GERD (gastroesophageal reflux disease)   . History of gout   . HTN (hypertension)   . Hypothyroidism   . IBS (irritable bowel syndrome)    ?  [name] Lupus ([place]) 07/2019  . Metabolic acidosis   . Migraine    "maybe once/yr" (11/04/2014)  . Neuropathy    right leg  . Obesity   . Pancreatitis   . Pancreatitis   . PONV (postoperative nausea  and vomiting) 1981 X 1   only time  . Seizures ([place]) 2003    due to tramadol reaction -- takes depakote for migraine prevention  . Sleep apnea    does not use cpap  . Spondylolisthesis of lumbar region   . Unsteady gait   . Wears glasses     Past Surgical History:  Procedure Laterality Date  . ANKLE GANGLION CYST EXCISION Right 1980's  . ANTERIOR LAT LUMBAR FUSION N/A 12/09/2019   Procedure: Lumbar One-Two Anterolateral lumbar decompression/interbody fusion with lateral plate;  Surgeon: Kristeen Miss, MD;  Location: [facility];  Service: Neurosurgery;  Laterality: N/A;  Lumbar One-Two Anterolateral lumbar decompression/interbody fusion with lateral plate  . BACK SURGERY    . BREAST SURGERY     biopsy  . BUNIONECTOMY Left 2012  . CARPAL TUNNEL RELEASE Right 05/22/2019   Procedure: RIGHT CARPAL TUNNEL RELEASE;  Surgeon: Daryll Brod, MD;  Location: [facility];  Service: Orthopedics;  Laterality: Right;  . CARPOMETACARPEL SUSPENSION PLASTY Right 05/22/2019   Procedure: ARTHROPLASY CARPOMETACARPAL RIGHT THUMB TRAPEZIUM EXCISION TENDON TRANSFER MICRO LINK SUSPENSION;  Surgeon: Daryll Brod, MD;  Location: [facility];  Service: Orthopedics;  Laterality: Right;  . CESAREAN SECTION  1986  . COLONOSCOPY W/ BIOPSIES AND POLYPECTOMY    . EYE SURGERY Bilateral 2012   "lasered a hole in my iris cause pressure was building up"  . LAPAROSCOPIC CHOLECYSTECTOMY  1990's  . NASAL SINUS SURGERY  1980's  . POSTERIOR LUMBAR FUSION  05/2013   L4-5; S1  . POSTERIOR LUMBAR FUSION  11/04/2014   L3-4  . SHOULDER ARTHROSCOPY W/ ROTATOR CUFF REPAIR Right 2012  . TUBAL LIGATION  1992    Family History  Problem Relation Age of Onset  . Lung cancer Mother   . Heart disease Father   . Heart disease Maternal Grandfather        PGF  . Alcohol abuse Maternal Grandfather   . Hyperlipidemia Maternal Grandfather   . Anxiety disorder Sister   . Fibromyalgia Sister   . Nephrolithiasis  Daughter     Social History:  reports that she has never smoked. She has never used smokeless tobacco. She reports current alcohol use of about 1.0 standard drink of alcohol per week. She reports that she does not use drugs. She denies tobacco use. She has 1-2 glasses of wine per week. She denies any exposure to radiation or toxins. She is retired. She worked in [employer] for [employer]. The patient is accompanied by her husband, Crystal Larsen, today.  Allergies:  Allergies  Allergen Reactions  . Ultram [Tramadol] Other (See Comments)    seizures  . Adhesive [Tape] Rash    bandaide  . Erythromycin Rash  . Latex Rash  . Neurontin [Gabapentin] Itching  . Other Rash    adhesive  . Sulfonamide Derivatives Rash    Current Medications: Current Outpatient Medications  Medication Sig Dispense Refill  . acetaminophen (TYLENOL) 500 MG tablet Take 1,000 mg by mouth 3 (three) times daily.    [name] amLODipine (NORVASC) 10 MG tablet Take 10 mg by mouth at bedtime.     [name] aspirin EC 81 MG tablet Take 81 mg by mouth at bedtime.    . Belimumab (BENLYSTA IV)     . desvenlafaxine (PRISTIQ) 50 MG 24 hr tablet Take 1 tablet (50 mg total) by mouth daily. 90 tablet 1  . dicyclomine (BENTYL) 20 MG tablet Take 20 mg by mouth daily as needed (abdominal pain).     [name] diphenhydrAMINE HCl, Sleep, (UNISOM SLEEPGELS) 50 MG CAPS Take 50 mg by mouth at bedtime.    . diphenhydrAMINE HCl, Sleep, 50 MG CAPS Unisom (diphenhydramine)    . divalproex (DEPAKOTE ER) 250 MG 24 hr tablet Take 1 tablet (250 mg total) by mouth daily. 90 tablet 3  . Fe Fum-FePoly-Vit C-Vit B3 (INTEGRA PO) Take 1 tablet by mouth daily.    . hydrocortisone (CORTEF) 10 MG tablet Take 10 mg by mouth See admin instructions. Take 15 mg in the morning and 5 mg in the afternoon    . hydroxychloroquine (PLAQUENIL) 200 MG tablet Take 200 mg by mouth 2 (two) times daily.    [name] levocetirizine (XYZAL) 5 MG tablet Take 5 mg by mouth at bedtime.    [name] levothyroxine  (SYNTHROID) 200 MCG tablet Take 200 mcg by mouth daily before breakfast.    . losartan-hydrochlorothiazide (HYZAAR) 100-12.5 MG tablet losartan 100 mg-hydrochlorothiazide 12.5 mg tablet    . Melatonin 10 MG TBDP Take 10 mg by mouth at bedtime.    . niacin (NIASPAN) 500 MG CR tablet Take 500 mg by mouth at bedtime.    . Omega-3 Fatty Acids (OMEGA 3 PO) Take 1 capsule by mouth at bedtime.    . Omeprazole 20 MG TBDD     . oxymetazoline (AFRIN) 0.05 % nasal spray Place 1 spray into both nostrils 2 (two) times daily  as needed for congestion.    . Probiotic Product (PROBIOTIC PO) Take 1 capsule by mouth daily.      No current facility-administered medications for this visit.    Review of Systems  Constitutional: Positive for fever (s/p chemotherapy) and weight loss (3 lbs). Negative for chills, diaphoresis and malaise/fatigue.  HENT: Negative for congestion, ear discharge, ear pain, hearing loss, nosebleeds, sinus pain, sore throat and tinnitus.   Eyes: Negative for blurred vision.  Respiratory: Negative for cough, hemoptysis, sputum production and shortness of breath.   Cardiovascular: Negative for chest pain, palpitations and leg swelling.  Gastrointestinal: Positive for heartburn (occasional, on meds). Negative for abdominal pain, blood in stool, constipation, diarrhea, melena, nausea and vomiting.       IBS  Genitourinary: Negative for dysuria, frequency, hematuria and urgency.  Musculoskeletal: Positive for joint pain (arthritis; hip pain worse after recent activity). Negative for back pain, myalgias and neck pain.  Skin: Negative for itching and rash.       Dry skin  Neurological: Positive for sensory change (neuropathy in feet, worsening) and headaches (s/p chemotherapy). Negative for dizziness, tingling and weakness.  Endo/Heme/Allergies: Does not bruise/bleed easily.  Psychiatric/Behavioral: Negative for depression and memory loss. The patient is not nervous/anxious and does not have  insomnia.   All other systems reviewed and are negative.  Performance status (ECOG): 1  Vitals Blood pressure 110/62, pulse 87, temperature 98.4 F (36.9 C), temperature source Tympanic, weight 170 lb 4.9 oz (77.3 kg), SpO2 100 %.   Physical Exam Vitals and nursing note reviewed.  Constitutional:      General: She is not in acute distress.    Appearance: She is not diaphoretic.  HENT:     Head: Normocephalic and atraumatic.     Mouth/Throat:     Mouth: Mucous membranes are moist.     Pharynx: Oropharynx is clear.  Eyes:     General: No scleral icterus.    Extraocular Movements: Extraocular movements intact.     Conjunctiva/sclera: Conjunctivae normal.     Pupils: Pupils are equal, round, and reactive to light.     Comments: Glasses.  Cardiovascular:     Rate and Rhythm: Normal rate and regular rhythm.     Heart sounds: Normal heart sounds. No murmur heard.   Pulmonary:     Effort: Pulmonary effort is normal. No respiratory distress.     Breath sounds: Normal breath sounds. No wheezing or rales.  Chest:     Chest wall: No tenderness.  Breasts:     Right: No axillary adenopathy or supraclavicular adenopathy.     Left: No axillary adenopathy or supraclavicular adenopathy.    Abdominal:     General: Bowel sounds are normal. There is no distension.     Palpations: Abdomen is soft. There is no mass.     Tenderness: There is no abdominal tenderness. There is no guarding or rebound.  Musculoskeletal:        General: No swelling or tenderness. Normal range of motion.     Cervical back: Normal range of motion and neck supple.  Lymphadenopathy:     Head:     Right side of head: No preauricular, posterior auricular or occipital adenopathy.     Left side of head: No preauricular, posterior auricular or occipital adenopathy.     Cervical: No cervical adenopathy.     Upper Body:     Right upper body: No supraclavicular or axillary adenopathy.     Left upper body:  No  supraclavicular or axillary adenopathy.     Lower Body: No right inguinal adenopathy. No left inguinal adenopathy.  Skin:    General: Skin is warm and dry.  Neurological:     Mental Status: She is alert and oriented to person, place, and time.  Psychiatric:        Behavior: Behavior normal.        Thought Content: Thought content normal.        Judgment: Judgment normal.    Appointment on 11/18/2020  Component Date Value Ref Range Status  . Color, Urine 11/18/2020 YELLOW  YELLOW Final  . APPearance 11/18/2020 HAZY* CLEAR Final  . Specific Gravity, Urine 11/18/2020 >1.030* 1.005 - 1.030 Final  . pH 11/18/2020 5.0  5.0 - 8.0 Final  . Glucose, UA 11/18/2020 NEGATIVE  NEGATIVE mg/dL Final  . Hgb urine dipstick 11/18/2020 TRACE* NEGATIVE Final  . Bilirubin Urine 11/18/2020 SMALL* NEGATIVE Final  . Ketones, ur 11/18/2020 TRACE* NEGATIVE mg/dL Final  . Protein, ur 11/18/2020 100* NEGATIVE mg/dL Final  . Nitrite 11/18/2020 NEGATIVE  NEGATIVE Final  . Chalmers Guest 11/18/2020 NEGATIVE  NEGATIVE Final  . Squamous Epithelial / LPF 11/18/2020 0-5  0 - 5 Final  . WBC, UA 11/18/2020 6-10  0 - 5 WBC/hpf Final  . RBC / HPF 11/18/2020 6-10  0 - 5 RBC/hpf Final  . Bacteria, UA 11/18/2020 MANY* NONE SEEN Final  . Hyaline Casts, UA 11/18/2020 PRESENT   Final  . Granular Casts, UA 11/18/2020 PRESENT   Final   Performed at [facility], [address] [place], [place] [address]  Appointment on 11/18/2020  Component Date Value Ref Range Status  . WBC 11/18/2020 7.6  4.0 - 10.5 K/uL Final  . RBC 11/18/2020 4.14  3.87 - 5.11 MIL/uL Final  . Hemoglobin 11/18/2020 12.6  12.0 - 15.0 g/dL Final  . HCT 11/18/2020 37.5  36.0 - 46.0 % Final  . MCV 11/18/2020 90.6  80.0 - 100.0 fL Final  . MCH 11/18/2020 30.4  26.0 - 34.0 pg Final  . MCHC 11/18/2020 33.6  30.0 - 36.0 g/dL Final  . RDW 11/18/2020 13.8  11.5 - 15.5 % Final  . Platelets 11/18/2020 386  150 - 400 K/uL Final  . nRBC 11/18/2020 0.0   0.0 - 0.2 % Final  . Neutrophils Relative % 11/18/2020 79  % Final  . Neutro Abs 11/18/2020 6.1  1.7 - 7.7 K/uL Final  . Lymphocytes Relative 11/18/2020 14  % Final  . Lymphs Abs 11/18/2020 1.1  0.7 - 4.0 K/uL Final  . Monocytes Relative 11/18/2020 5  % Final  . Monocytes Absolute 11/18/2020 0.4  0.1 - 1.0 K/uL Final  . Eosinophils Relative 11/18/2020 1  % Final  . Eosinophils Absolute 11/18/2020 0.0  0.0 - 0.5 K/uL Final  . Basophils Relative 11/18/2020 0  % Final  . Basophils Absolute 11/18/2020 0.0  0.0 - 0.1 K/uL Final  . Immature Granulocytes 11/18/2020 1  % Final  . Abs Immature Granulocytes 11/18/2020 0.05  0.00 - 0.07 K/uL Final   Performed at [facility], [address] [place], [place] [address]    Assessment:  Crystal Larsen is a 64 y.o. female with systemic lupus erythematosis (SLE) and lupus nephritis.  She completed a course of prednisone.  She is on Benlysta.  Right kidney biopsy on 08/31/2020 revealed pauci immune focal necrotizing and early sclerosing glomerulonephritis with segments of tuft inflammation and rare focal tuft necrosis and  less that 5% focal fibrocellular crescent formation (in one glomerulus). She is felt to have a lupus IV-S lesion.  ANCA, anti-GBM was normal.  She is s/p week #1 Cytoxan on 11/04/2020.  She tolerated treatment well.  She has inflammatory arthritis, adrenal insufficiency,and hypothyroidism secondary to Hashimoto's thyroiditis.  Symptomatically, she feels "good." She developed a transient fever and a headache the morning after her first treatment. She denies any symptoms associated with her lupus.  Hip pain has worsened felt secondary to being very active.   Plan: 1.   Labs today: CBC with diff, CMP, Mg, urinalysis, protein/creatine ratio. 2.   Lupus nephritis  Clinically, she is doing well.  Exam is stable.  She is s/p cycle #1 Cytoxan (11/04/2020).  Plan is cyclophosphamide 500 mg IV every 2 weeks x 6.  Labs  reviewed.  Cycle #2  Cytoxan today. 3.   Rx:  ondansetron 4 mg po q 8 hours prn nausea (dis: #20). 4.   RTC in 2 weeks for MD assessment, labs (CBC with diff, CMP, Mg, urinalysis, protein/creatine ratio), and cycle #3 Cytoxan.  I discussed the assessment and treatment plan with the patient.  The patient was provided an opportunity to ask questions and all were answered.  The patient agreed with the plan and demonstrated an understanding of the instructions.  The patient was advised to call back if the symptoms worsen or if the condition fails to improve as anticipated.   Rolla Kedzierski C. Mike Gip, MD, PhD    11/18/2020, 9:38 AM  I, Mirian Mo Tufford, am acting as Education administrator for [employer]. Mike Gip, MD, PhD.  I, Happy Begeman C. Mike Gip, MD, have reviewed the above documentation for accuracy and completeness, and I agree with the above.

## 2020-11-18 ENCOUNTER — Other Ambulatory Visit: Payer: Self-pay

## 2020-11-18 ENCOUNTER — Encounter: Payer: Self-pay | Admitting: Hematology and Oncology

## 2020-11-18 ENCOUNTER — Inpatient Hospital Stay: Payer: Medicare Other

## 2020-11-18 ENCOUNTER — Inpatient Hospital Stay (HOSPITAL_BASED_OUTPATIENT_CLINIC_OR_DEPARTMENT_OTHER): Payer: Medicare Other | Admitting: Hematology and Oncology

## 2020-11-18 VITALS — BP 110/62 | HR 87 | Temp 98.4°F | Wt 170.3 lb

## 2020-11-18 VITALS — BP 117/70 | HR 69

## 2020-11-18 DIAGNOSIS — R809 Proteinuria, unspecified: Secondary | ICD-10-CM

## 2020-11-18 DIAGNOSIS — M3214 Glomerular disease in systemic lupus erythematosus: Secondary | ICD-10-CM

## 2020-11-18 DIAGNOSIS — Z5111 Encounter for antineoplastic chemotherapy: Secondary | ICD-10-CM | POA: Diagnosis not present

## 2020-11-18 DIAGNOSIS — M3219 Other organ or system involvement in systemic lupus erythematosus: Secondary | ICD-10-CM

## 2020-11-18 LAB — URINALYSIS, COMPLETE (UACMP) WITH MICROSCOPIC
Glucose, UA: NEGATIVE mg/dL
Leukocytes,Ua: NEGATIVE
Nitrite: NEGATIVE
Protein, ur: 100 mg/dL — AB
Specific Gravity, Urine: >1.03 — ABNORMAL HIGH (ref 1.005–1.030)
pH: 5 (ref 5.0–8.0)

## 2020-11-18 LAB — COMPREHENSIVE METABOLIC PANEL
ALT: 19 U/L (ref 0–44)
AST: 31 U/L (ref 15–41)
Albumin: 3.3 g/dL — ABNORMAL LOW (ref 3.5–5.0)
Alkaline Phosphatase: 60 U/L (ref 38–126)
Anion gap: 8 (ref 5–15)
BUN: 27 mg/dL — ABNORMAL HIGH (ref 8–23)
CO2: 25 mmol/L (ref 22–32)
Calcium: 9.2 mg/dL (ref 8.9–10.3)
Chloride: 104 mmol/L (ref 98–111)
Creatinine, Ser: 1.06 mg/dL — ABNORMAL HIGH (ref 0.44–1.00)
GFR, Estimated: 58 mL/min — ABNORMAL LOW (ref >60)
Glucose, Bld: 66 mg/dL — ABNORMAL LOW (ref 70–99)
Potassium: 3.5 mmol/L (ref 3.5–5.1)
Sodium: 137 mmol/L (ref 135–145)
Total Bilirubin: 0.6 mg/dL (ref 0.3–1.2)
Total Protein: 6.7 g/dL (ref 6.5–8.1)

## 2020-11-18 LAB — CBC WITH DIFFERENTIAL/PLATELET
Abs Immature Granulocytes: 0.05 10*3/uL (ref 0.00–0.07)
Basophils Absolute: 0 10*3/uL (ref 0.0–0.1)
Basophils Relative: 0 %
Eosinophils Absolute: 0 10*3/uL (ref 0.0–0.5)
Eosinophils Relative: 1 %
HCT: 37.5 % (ref 36.0–46.0)
Hemoglobin: 12.6 g/dL (ref 12.0–15.0)
Immature Granulocytes: 1 %
Lymphocytes Relative: 14 %
Lymphs Abs: 1.1 10*3/uL (ref 0.7–4.0)
MCH: 30.4 pg (ref 26.0–34.0)
MCHC: 33.6 g/dL (ref 30.0–36.0)
MCV: 90.6 fL (ref 80.0–100.0)
Monocytes Absolute: 0.4 10*3/uL (ref 0.1–1.0)
Monocytes Relative: 5 %
Neutro Abs: 6.1 10*3/uL (ref 1.7–7.7)
Neutrophils Relative %: 79 %
Platelets: 386 10*3/uL (ref 150–400)
RBC: 4.14 MIL/uL (ref 3.87–5.11)
RDW: 13.8 % (ref 11.5–15.5)
WBC: 7.6 10*3/uL (ref 4.0–10.5)
nRBC: 0 % (ref 0.0–0.2)

## 2020-11-18 LAB — PROTEIN / CREATININE RATIO, URINE
Creatinine, Urine: 201 mg/dL
Protein Creatinine Ratio: 0.49 mg/mg{Cre} — ABNORMAL HIGH (ref 0.00–0.15)
Total Protein, Urine: 99 mg/dL

## 2020-11-18 LAB — MAGNESIUM: Magnesium: 1.9 mg/dL (ref 1.7–2.4)

## 2020-11-18 MED ORDER — ONDANSETRON HCL 4 MG PO TABS: 4.0000 mg | ORAL_TABLET | Freq: Three times a day (TID) | ORAL | 0 refills | Status: DC | PRN

## 2020-11-18 MED ORDER — SODIUM CHLORIDE 0.9 % IV SOLN
Freq: Once | INTRAVENOUS | Status: AC
Start: 2020-11-18 — End: 2020-11-18
  Filled 2020-11-18: qty 250

## 2020-11-18 MED ORDER — PALONOSETRON HCL INJECTION 0.25 MG/5ML
0.2500 mg | Freq: Once | INTRAVENOUS | Status: AC
  Administered 2020-11-18: 10:00:00 0.25 mg via INTRAVENOUS
  Filled 2020-11-18: qty 5

## 2020-11-18 MED ORDER — SODIUM CHLORIDE 0.9 % IV SOLN
500.0000 mg | Freq: Once | INTRAVENOUS | Status: AC
  Administered 2020-11-18: 11:00:00 500 mg via INTRAVENOUS
  Filled 2020-11-18: qty 25

## 2020-11-18 MED ORDER — SODIUM CHLORIDE 0.9 % IV SOLN
10.0000 mg | Freq: Once | INTRAVENOUS | Status: AC
  Administered 2020-11-18: 10:00:00 10 mg via INTRAVENOUS
  Filled 2020-11-18: qty 1

## 2020-11-18 NOTE — Progress Notes (Signed)
Patient received prescribed treatment in clinic. Tolerated well. Patient stable at discharge. 

## 2020-11-18 NOTE — Progress Notes (Signed)
Patient would like to be prescribe nausea medication for just in case needs. Patient has nausea medication from ER visit but it expires 12/17/2020.

## 2020-12-01 ENCOUNTER — Other Ambulatory Visit: Payer: Self-pay

## 2020-12-01 DIAGNOSIS — M3219 Other organ or system involvement in systemic lupus erythematosus: Secondary | ICD-10-CM

## 2020-12-01 DIAGNOSIS — R809 Proteinuria, unspecified: Secondary | ICD-10-CM

## 2020-12-01 NOTE — Progress Notes (Signed)
[facility]  [address], [address] [place], [place] [address] Phone: [phone]  Fax: [phone]   Clinic Day:  12/02/2020  Referring physician: Mila Palmer, MD  Chief Complaint: Crystal Larsen is a 65 y.o. female with lupus nephritis and stage 3 chronic kidney disease who is seen for assessment prior to cycle #3 Cytoxan.  HPI: The patient was last seen in the hematology clinic on 11/18/2020. At that time, she felt "good".  She described a transient fever and headache the morning after her first cycle of Cytoxan.  Denies any symptoms associated with her lupus.  Hip pain had worsened secondary to being very active.  Exam was stable.  Hematocrit was 37.5, hemoglobin 12.6, platelets 386,000, WBC 7,600. Creatinine was 1.06 (CrCl 58 ml/min). She received cycle #2 Cytoxan.  During the interim, she has been "good." She had a headache and low-grade fever the day after her last treatment. Her temperature was "99 something." She did not have any nausea. Her lupus is quiet. Her joint pain and heartburn are stable. She feels that her foot neuropathy is starting to go up her ankle. She had a hip MRI at [facility] yesterday.  She will eat potassium rich foods to try to raise her potassium.   Past Medical History:  Diagnosis Date  . Acute kidney injury (HCC)   . Adrenal insufficiency (HCC)   . Anemia   . Anxiety   . Arthritis    "left foot; left hand; right shoulder; back" (11/04/2014)  . Elevated liver enzymes   . Gallstones   . GERD (gastroesophageal reflux disease)   . History of gout   . HTN (hypertension)   . Hypothyroidism   . IBS (irritable bowel syndrome)    ?  [name] Lupus (HCC) 07/2019  . Metabolic acidosis   . Migraine    "maybe once/yr" (11/04/2014)  . Neuropathy    right leg  . Obesity   . Pancreatitis   . Pancreatitis   . PONV (postoperative nausea and vomiting) 1981 X 1   only time  . Seizures (HCC) 2003    due to tramadol reaction --  takes depakote for migraine prevention  . Sleep apnea    does not use cpap  . Spondylolisthesis of lumbar region   . Unsteady gait   . Wears glasses     Past Surgical History:  Procedure Laterality Date  . ANKLE GANGLION CYST EXCISION Right 1980's  . ANTERIOR LAT LUMBAR FUSION N/A 12/09/2019   Procedure: Lumbar One-Two Anterolateral lumbar decompression/interbody fusion with lateral plate;  Surgeon: Barnett Abu, MD;  Location: [facility] OR;  Service: Neurosurgery;  Laterality: N/A;  Lumbar One-Two Anterolateral lumbar decompression/interbody fusion with lateral plate  . BACK SURGERY    . BREAST SURGERY     biopsy  . BUNIONECTOMY Left 2012  . CARPAL TUNNEL RELEASE Right 05/22/2019   Procedure: RIGHT CARPAL TUNNEL RELEASE;  Surgeon: Cindee Salt, MD;  Location: [facility];  Service: Orthopedics;  Laterality: Right;  . CARPOMETACARPEL SUSPENSION PLASTY Right 05/22/2019   Procedure: ARTHROPLASY CARPOMETACARPAL RIGHT THUMB TRAPEZIUM EXCISION TENDON TRANSFER MICRO LINK SUSPENSION;  Surgeon: Cindee Salt, MD;  Location: [facility];  Service: Orthopedics;  Laterality: Right;  . CESAREAN SECTION  1986  . COLONOSCOPY W/ BIOPSIES AND POLYPECTOMY    . EYE SURGERY Bilateral 2012   "lasered a hole in my iris cause pressure was building up"  . LAPAROSCOPIC CHOLECYSTECTOMY  1990's  . NASAL SINUS SURGERY  1980's  . POSTERIOR LUMBAR FUSION  05/2013   L4-5; S1  . POSTERIOR LUMBAR FUSION  11/04/2014   L3-4  . SHOULDER ARTHROSCOPY W/ ROTATOR CUFF REPAIR Right 2012  . TUBAL LIGATION  1992    Family History  Problem Relation Age of Onset  . Lung cancer Mother   . Heart disease Father   . Heart disease Maternal Grandfather        PGF  . Alcohol abuse Maternal Grandfather   . Hyperlipidemia Maternal Grandfather   . Anxiety disorder Sister   . Fibromyalgia Sister   . Nephrolithiasis Daughter     Social History:  reports that she has never smoked. She has never used smokeless  tobacco. She reports current alcohol use of about 1.0 standard drink of alcohol per week. She reports that she does not use drugs. She denies tobacco use. She has 1-2 glasses of wine per week. She denies any exposure to radiation or toxins. She is retired. She worked in [employer] for [employer]. The patient is accompanied by her husband, Crystal Larsen, today.  Allergies:  Allergies  Allergen Reactions  . Ultram [Tramadol] Other (See Comments)    seizures  . Hydrocodone-Acetaminophen Itching  . Adhesive [Tape] Rash    bandaide  . Erythromycin Rash  . Latex Rash  . Neurontin [Gabapentin] Itching  . Other Rash    adhesive  . Sulfonamide Derivatives Rash    Current Medications: Current Outpatient Medications  Medication Sig Dispense Refill  . acetaminophen (TYLENOL) 500 MG tablet Take 1,000 mg by mouth 3 (three) times daily.    [name] amLODipine (NORVASC) 10 MG tablet Take 10 mg by mouth at bedtime.     [name] aspirin EC 81 MG tablet Take 81 mg by mouth at bedtime.    . Belimumab (BENLYSTA IV)     . desvenlafaxine (PRISTIQ) 50 MG 24 hr tablet Take 1 tablet (50 mg total) by mouth daily. 90 tablet 1  . dicyclomine (BENTYL) 20 MG tablet Take 20 mg by mouth daily as needed (abdominal pain).     [name] diphenhydrAMINE HCl, Sleep, (UNISOM SLEEPGELS) 50 MG CAPS Take 50 mg by mouth at bedtime.    . diphenhydrAMINE HCl, Sleep, 50 MG CAPS Unisom (diphenhydramine)    . divalproex (DEPAKOTE ER) 250 MG 24 hr tablet Take 1 tablet (250 mg total) by mouth daily. 90 tablet 3  . Fe Fum-FePoly-Vit C-Vit B3 (INTEGRA PO) Take 1 tablet by mouth daily.    . hydrocortisone (CORTEF) 10 MG tablet Take 10 mg by mouth See admin instructions. Take 15 mg in the morning and 5 mg in the afternoon    . hydroxychloroquine (PLAQUENIL) 200 MG tablet Take 200 mg by mouth 2 (two) times daily.    [name] levocetirizine (XYZAL) 5 MG tablet Take 5 mg by mouth at bedtime.    [name] levothyroxine (SYNTHROID) 200 MCG tablet Take 200 mcg by mouth daily  before breakfast.    . losartan-hydrochlorothiazide (HYZAAR) 100-12.5 MG tablet losartan 100 mg-hydrochlorothiazide 12.5 mg tablet    . Melatonin 10 MG TBDP Take 10 mg by mouth at bedtime.    . niacin (NIASPAN) 500 MG CR tablet Take 500 mg by mouth at bedtime.    . Omega-3 Fatty Acids (OMEGA 3 PO) Take 1 capsule by mouth at bedtime.    . Omeprazole 20 MG TBDD     . ondansetron (ZOFRAN) 4 MG tablet Take 1 tablet (4 mg total) by mouth every 8 (eight) hours as needed  for nausea or vomiting. 20 tablet 0  . oxymetazoline (AFRIN) 0.05 % nasal spray Place 1 spray into both nostrils 2 (two) times daily as needed for congestion.    . Probiotic Product (PROBIOTIC PO) Take 1 capsule by mouth daily.      No current facility-administered medications for this visit.   Facility-Administered Medications Ordered in Other Visits  Medication Dose Route Frequency Provider Last Rate Last Admin  . 0.9 %  sodium chloride infusion   Intravenous Once Channelle Bottger C, MD      . cyclophosphamide (CYTOXAN) 500 mg in sodium chloride 0.9 % 250 mL chemo infusion  500 mg Intravenous Once Malita Ignasiak C, MD      . dexamethasone (DECADRON) 10 mg in sodium chloride 0.9 % 50 mL IVPB  10 mg Intravenous Once Tanner Vigna C, MD      . palonosetron (ALOXI) injection 0.25 mg  0.25 mg Intravenous Once Lequita Asal, MD        Review of Systems  Constitutional: Positive for fever (brief fever of 99.x s/p chemotherapy). Negative for chills, diaphoresis, malaise/fatigue and weight loss (up 2 lbs).       Feels "good".  HENT: Negative for congestion, ear discharge, ear pain, hearing loss, nosebleeds, sinus pain, sore throat and tinnitus.   Eyes: Negative for blurred vision.  Respiratory: Negative for cough, hemoptysis, sputum production and shortness of breath.   Cardiovascular: Negative for chest pain, palpitations and leg swelling.  Gastrointestinal: Positive for heartburn (occasional, on meds). Negative for  abdominal pain, blood in stool, constipation, diarrhea, melena, nausea and vomiting.       IBS  Genitourinary: Negative for dysuria, frequency, hematuria and urgency.  Musculoskeletal: Positive for joint pain (arthritis in hands, hip pain). Negative for back pain, myalgias and neck pain.  Skin: Negative for itching and rash.       Dry skin.  Neurological: Positive for sensory change (neuropathy in feet, worsening) and headaches (s/p chemo). Negative for dizziness, tingling and weakness.  Endo/Heme/Allergies: Does not bruise/bleed easily.  Psychiatric/Behavioral: Negative for depression and memory loss. The patient is not nervous/anxious and does not have insomnia.   All other systems reviewed and are negative.  Performance status (ECOG): 1  Vitals Blood pressure 128/84, pulse 83, temperature 97.8 F (36.6 C), temperature source Tympanic, resp. rate 16, weight 172 lb 1.1 oz (78 kg), SpO2 100 %.   Physical Exam Vitals and nursing note reviewed.  Constitutional:      General: She is not in acute distress.    Appearance: She is not diaphoretic.  HENT:     Head: Normocephalic and atraumatic.     Mouth/Throat:     Mouth: Mucous membranes are moist.     Pharynx: Oropharynx is clear.  Eyes:     General: No scleral icterus.    Extraocular Movements: Extraocular movements intact.     Conjunctiva/sclera: Conjunctivae normal.     Pupils: Pupils are equal, round, and reactive to light.  Cardiovascular:     Rate and Rhythm: Normal rate and regular rhythm.     Heart sounds: Normal heart sounds. No murmur heard.   Pulmonary:     Effort: Pulmonary effort is normal. No respiratory distress.     Breath sounds: Normal breath sounds. No wheezing or rales.  Chest:     Chest wall: No tenderness.  Breasts:     Right: No axillary adenopathy or supraclavicular adenopathy.     Left: No axillary adenopathy or supraclavicular adenopathy.  Abdominal:     General: Bowel sounds are normal. There is  no distension.     Palpations: Abdomen is soft. There is no mass.     Tenderness: There is no abdominal tenderness. There is no guarding or rebound.  Musculoskeletal:        General: No swelling or tenderness. Normal range of motion.     Cervical back: Normal range of motion and neck supple.  Lymphadenopathy:     Head:     Right side of head: No preauricular, posterior auricular or occipital adenopathy.     Left side of head: No preauricular, posterior auricular or occipital adenopathy.     Cervical: No cervical adenopathy.     Upper Body:     Right upper body: No supraclavicular or axillary adenopathy.     Left upper body: No supraclavicular or axillary adenopathy.     Lower Body: No right inguinal adenopathy. No left inguinal adenopathy.  Skin:    General: Skin is warm and dry.  Neurological:     Mental Status: She is alert and oriented to person, place, and time.  Psychiatric:        Behavior: Behavior normal.        Thought Content: Thought content normal.        Judgment: Judgment normal.    Appointment on 12/02/2020  Component Date Value Ref Range Status  . Sodium 12/02/2020 139  135 - 145 mmol/L Final  . Potassium 12/02/2020 3.3* 3.5 - 5.1 mmol/L Final  . Chloride 12/02/2020 104  98 - 111 mmol/L Final  . CO2 12/02/2020 26  22 - 32 mmol/L Final  . Glucose, Bld 12/02/2020 71  70 - 99 mg/dL Final   Glucose reference range applies only to samples taken after fasting for at least 8 hours.  . BUN 12/02/2020 17  8 - 23 mg/dL Final  . Creatinine, Ser 12/02/2020 0.97  0.44 - 1.00 mg/dL Final  . Calcium 12/02/2020 9.1  8.9 - 10.3 mg/dL Final  . Total Protein 12/02/2020 6.7  6.5 - 8.1 g/dL Final  . Albumin 12/02/2020 3.3* 3.5 - 5.0 g/dL Final  . AST 12/02/2020 35  15 - 41 U/L Final  . ALT 12/02/2020 22  0 - 44 U/L Final  . Alkaline Phosphatase 12/02/2020 63  38 - 126 U/L Final  . Total Bilirubin 12/02/2020 0.5  0.3 - 1.2 mg/dL Final  . GFR, Estimated 12/02/2020 >60  >60 mL/min  Final   Comment: (NOTE) Calculated using the CKD-EPI Creatinine Equation (2021)   . Anion gap 12/02/2020 9  5 - 15 Final   Performed at [facility], [address], [place], [place] [address]  . WBC 12/02/2020 5.8  4.0 - 10.5 K/uL Final  . RBC 12/02/2020 4.03  3.87 - 5.11 MIL/uL Final  . Hemoglobin 12/02/2020 12.3  12.0 - 15.0 g/dL Final  . HCT 12/02/2020 36.8  36.0 - 46.0 % Final  . MCV 12/02/2020 91.3  80.0 - 100.0 fL Final  . MCH 12/02/2020 30.5  26.0 - 34.0 pg Final  . MCHC 12/02/2020 33.4  30.0 - 36.0 g/dL Final  . RDW 12/02/2020 13.8  11.5 - 15.5 % Final  . Platelets 12/02/2020 338  150 - 400 K/uL Final  . nRBC 12/02/2020 0.0  0.0 - 0.2 % Final  . Neutrophils Relative % 12/02/2020 77  % Final  . Neutro Abs 12/02/2020 4.4  1.7 - 7.7 K/uL Final  . Lymphocytes Relative 12/02/2020 17  %  Final  . Lymphs Abs 12/02/2020 1.0  0.7 - 4.0 K/uL Final  . Monocytes Relative 12/02/2020 5  % Final  . Monocytes Absolute 12/02/2020 0.3  0.1 - 1.0 K/uL Final  . Eosinophils Relative 12/02/2020 1  % Final  . Eosinophils Absolute 12/02/2020 0.0  0.0 - 0.5 K/uL Final  . Basophils Relative 12/02/2020 0  % Final  . Basophils Absolute 12/02/2020 0.0  0.0 - 0.1 K/uL Final  . Immature Granulocytes 12/02/2020 0  % Final  . Abs Immature Granulocytes 12/02/2020 0.01  0.00 - 0.07 K/uL Final   Performed at [facility], [address] [place], [place] [address]  . Color, Urine 12/02/2020 YELLOW  YELLOW Final  . APPearance 12/02/2020 CLEAR  CLEAR Final  . Specific Gravity, Urine 12/02/2020 >1.030* 1.005 - 1.030 Final  . pH 12/02/2020 5.5  5.0 - 8.0 Final  . Glucose, UA 12/02/2020 NEGATIVE  NEGATIVE mg/dL Final  . Hgb urine dipstick 12/02/2020 NEGATIVE  NEGATIVE Final  . Bilirubin Urine 12/02/2020 NEGATIVE  NEGATIVE Final  . Ketones, ur 12/02/2020 NEGATIVE  NEGATIVE mg/dL Final  . Protein, ur 12/02/2020 100* NEGATIVE mg/dL Final  . Nitrite 12/02/2020 NEGATIVE  NEGATIVE Final  .  Chalmers Guest 12/02/2020 NEGATIVE  NEGATIVE Final  . Squamous Epithelial / LPF 12/02/2020 0-5  0 - 5 Final  . WBC, UA 12/02/2020 0-5  0 - 5 WBC/hpf Final  . RBC / HPF 12/02/2020 0-5  0 - 5 RBC/hpf Final  . Bacteria, UA 12/02/2020 RARE* NONE SEEN Final  . Mucus 12/02/2020 PRESENT   Final  . Hyaline Casts, UA 12/02/2020 PRESENT   Final   Performed at [facility], [address] [place], [place] [address]  . Magnesium 12/02/2020 1.7  1.7 - 2.4 mg/dL Final   Performed at [facility], [address] [place], [place] [address]    Assessment:  DAYLEN HACK is a 65 y.o. female with systemic lupus erythematosis (SLE) and lupus nephritis.  She completed a course of prednisone.  She is on Benlysta.  Right kidney biopsy on 08/31/2020 revealed pauci immune focal necrotizing and early sclerosing glomerulonephritis with segments of tuft inflammation and rare focal tuft necrosis and less that 5% focal fibrocellular crescent formation (in one glomerulus). She is felt to have a lupus IV-S lesion.  ANCA, anti-GBM was normal.  She is s/p week #2 Cytoxan (11/04/2020 - 11/18/2020).  Protein creatinine ratio has been followed: 2.51 on 11/03/2020 and 0.49 on 11/18/2020.  She has inflammatory arthritis, adrenal insufficiency,and hypothyroidism secondary to Hashimoto's thyroiditis.  Symptomatically, she feels "good".  She denies any concerns.  Exam is stable.  Plan: 1.   Labs today: CBC with diff, CMP, Mg, urinalysis, protein/creatine ratio. 2.   Lupus nephritis             Clinically, she is doing well             Exam is stable.             She is s/p 2 cycles of Cytoxan (last 11/18/2020).             Plan is cyclophosphamide 500 mg IV every 2 weeks x 6.             Labs reviewed.  Cycle #3 Cytoxan today. 3.   RTC in 2 weeks for MD assessment, labs (CBC with diff, CMP, Mg, urinalysis, protein/creatine ratio), and cycle #4 Cytoxan.  I discussed the assessment and treatment  plan with the patient.  The patient was provided an opportunity to ask questions and all were answered.  The patient agreed with the plan and demonstrated an understanding of the instructions.  The patient was advised to call back if the symptoms worsen or if the condition fails to improve as anticipated.    Anmarie Fukushima C. Mike Gip, MD, PhD    12/02/2020, 10:39 AM  I, Mirian Mo Tufford, am acting as Education administrator for [employer]. Mike Gip, MD, PhD.  I, Lillyann Ahart C. Mike Gip, MD, have reviewed the above documentation for accuracy and completeness, and I agree with the above.

## 2020-12-02 ENCOUNTER — Inpatient Hospital Stay: Payer: Medicare Other | Attending: Hematology and Oncology | Admitting: Hematology and Oncology

## 2020-12-02 ENCOUNTER — Inpatient Hospital Stay: Payer: Medicare Other

## 2020-12-02 ENCOUNTER — Encounter: Payer: Self-pay | Admitting: Hematology and Oncology

## 2020-12-02 ENCOUNTER — Other Ambulatory Visit: Payer: Self-pay

## 2020-12-02 VITALS — BP 128/84 | HR 83 | Temp 97.8°F | Resp 16 | Wt 172.1 lb

## 2020-12-02 DIAGNOSIS — Z5111 Encounter for antineoplastic chemotherapy: Secondary | ICD-10-CM | POA: Diagnosis not present

## 2020-12-02 DIAGNOSIS — M3214 Glomerular disease in systemic lupus erythematosus: Secondary | ICD-10-CM

## 2020-12-02 DIAGNOSIS — M3219 Other organ or system involvement in systemic lupus erythematosus: Secondary | ICD-10-CM

## 2020-12-02 LAB — CBC WITH DIFFERENTIAL/PLATELET
Abs Immature Granulocytes: 0.01 10*3/uL (ref 0.00–0.07)
Basophils Absolute: 0 10*3/uL (ref 0.0–0.1)
Basophils Relative: 0 %
Eosinophils Absolute: 0 10*3/uL (ref 0.0–0.5)
Eosinophils Relative: 1 %
HCT: 36.8 % (ref 36.0–46.0)
Hemoglobin: 12.3 g/dL (ref 12.0–15.0)
Immature Granulocytes: 0 %
Lymphocytes Relative: 17 %
Lymphs Abs: 1 10*3/uL (ref 0.7–4.0)
MCH: 30.5 pg (ref 26.0–34.0)
MCHC: 33.4 g/dL (ref 30.0–36.0)
MCV: 91.3 fL (ref 80.0–100.0)
Monocytes Absolute: 0.3 10*3/uL (ref 0.1–1.0)
Monocytes Relative: 5 %
Neutro Abs: 4.4 10*3/uL (ref 1.7–7.7)
Neutrophils Relative %: 77 %
Platelets: 338 10*3/uL (ref 150–400)
RBC: 4.03 MIL/uL (ref 3.87–5.11)
RDW: 13.8 % (ref 11.5–15.5)
WBC: 5.8 10*3/uL (ref 4.0–10.5)
nRBC: 0 % (ref 0.0–0.2)

## 2020-12-02 LAB — URINALYSIS, COMPLETE (UACMP) WITH MICROSCOPIC
Bilirubin Urine: NEGATIVE
Glucose, UA: NEGATIVE mg/dL
Hgb urine dipstick: NEGATIVE
Ketones, ur: NEGATIVE mg/dL
Leukocytes,Ua: NEGATIVE
Nitrite: NEGATIVE
Protein, ur: 100 mg/dL — AB
Specific Gravity, Urine: >1.03 — ABNORMAL HIGH (ref 1.005–1.030)
pH: 5.5 (ref 5.0–8.0)

## 2020-12-02 LAB — COMPREHENSIVE METABOLIC PANEL
ALT: 22 U/L (ref 0–44)
AST: 35 U/L (ref 15–41)
Albumin: 3.3 g/dL — ABNORMAL LOW (ref 3.5–5.0)
Alkaline Phosphatase: 63 U/L (ref 38–126)
Anion gap: 9 (ref 5–15)
BUN: 17 mg/dL (ref 8–23)
CO2: 26 mmol/L (ref 22–32)
Calcium: 9.1 mg/dL (ref 8.9–10.3)
Chloride: 104 mmol/L (ref 98–111)
Creatinine, Ser: 0.97 mg/dL (ref 0.44–1.00)
GFR, Estimated: >60 mL/min (ref >60)
Glucose, Bld: 71 mg/dL (ref 70–99)
Potassium: 3.3 mmol/L — ABNORMAL LOW (ref 3.5–5.1)
Sodium: 139 mmol/L (ref 135–145)
Total Bilirubin: 0.5 mg/dL (ref 0.3–1.2)
Total Protein: 6.7 g/dL (ref 6.5–8.1)

## 2020-12-02 LAB — MAGNESIUM: Magnesium: 1.7 mg/dL (ref 1.7–2.4)

## 2020-12-02 LAB — PROTEIN / CREATININE RATIO, URINE
Creatinine, Urine: 160 mg/dL
Protein Creatinine Ratio: 0.78 mg/mg{Cre} — ABNORMAL HIGH (ref 0.00–0.15)
Total Protein, Urine: 124 mg/dL

## 2020-12-02 MED ORDER — CYCLOPHOSPHAMIDE CHEMO INJECTION 1 GM
500.0000 mg | Freq: Once | INTRAMUSCULAR | Status: AC
Start: 2020-12-02 — End: 2020-12-02
  Administered 2020-12-02: 500 mg via INTRAVENOUS
  Filled 2020-12-02: qty 25

## 2020-12-02 MED ORDER — SODIUM CHLORIDE 0.9 % IV SOLN
Freq: Once | INTRAVENOUS | Status: AC
  Filled 2020-12-02: qty 250

## 2020-12-02 MED ORDER — PALONOSETRON HCL INJECTION 0.25 MG/5ML
0.2500 mg | Freq: Once | INTRAVENOUS | Status: AC
  Administered 2020-12-02: 0.25 mg via INTRAVENOUS
  Filled 2020-12-02: qty 5

## 2020-12-02 MED ORDER — SODIUM CHLORIDE 0.9 % IV SOLN
10.0000 mg | Freq: Once | INTRAVENOUS | Status: AC
  Administered 2020-12-02: 10 mg via INTRAVENOUS
  Filled 2020-12-02: qty 1

## 2020-12-02 NOTE — Progress Notes (Signed)
Pt received prescribed treatment in clinic, pt stable at d/c. 

## 2020-12-02 NOTE — Progress Notes (Signed)
Patient states after the first infusion the next day she had a headache and a fever. After the second infusion patient states she had a headache and a low grade fever the next day but the Nurse in infusion told her that would e expected.

## 2020-12-15 NOTE — Progress Notes (Signed)
[facility]  [address] [place], [place] [address] Phone: [phone]  Fax: [phone]   Clinic Day:  12/16/2020  Referring physician: Jonathon Jordan, MD  Chief Complaint: Crystal Larsen is a 65 y.o. female with lupus nephritis and stage III chronic kidney disease who is seen for assessment prior to cycle #4 Cytoxan.  HPI: The patient was last seen in the hematology clinic on 12/03/2019. At that time, she felt "good".  She denied any concerns.  Exam was stable. Hematocrit was 36.8, hemoglobin 12.3, platelets 338,000, WBC 5,800. Creatinine was 0.97.  Potassium was 3.3. Albumin was 3.3. Protein/Creatinine ratio was 0.78. She received cycle #3 Cytoxan.  During the interim, she has been fine. She had a very low grade fever after her last treatment ("99 something"). She also had a mild headache. These symptoms resolved with Tylenol. Each time she has treatment, she feels "wiped out" for a longer period of time.  With her first cycle, she noted fatigue for 1 day, second cycle 2 days, and third cycle 3 days.  The neuropathy in her feet has started to affect her balance. Her hip pain is stable. She ate high potassium foods for a few days after her last visit but then forgot about it.   Past Medical History:  Diagnosis Date  . Acute kidney injury ([place])   . Adrenal insufficiency ([place])   . Anemia   . Anxiety   . Arthritis    "left foot; left hand; right shoulder; back" (11/04/2014)  . Elevated liver enzymes   . Gallstones   . GERD (gastroesophageal reflux disease)   . History of gout   . HTN (hypertension)   . Hypothyroidism   . IBS (irritable bowel syndrome)    ?  [name] Lupus ([place]) 07/2019  . Metabolic acidosis   . Migraine    "maybe once/yr" (11/04/2014)  . Neuropathy    right leg  . Obesity   . Pancreatitis   . Pancreatitis   . PONV (postoperative nausea and vomiting) 1981 X 1   only time  . Seizures ([place]) 2003    due to tramadol reaction  -- takes depakote for migraine prevention  . Sleep apnea    does not use cpap  . Spondylolisthesis of lumbar region   . Unsteady gait   . Wears glasses     Past Surgical History:  Procedure Laterality Date  . ANKLE GANGLION CYST EXCISION Right 1980's  . ANTERIOR LAT LUMBAR FUSION N/A 12/09/2019   Procedure: Lumbar One-Two Anterolateral lumbar decompression/interbody fusion with lateral plate;  Surgeon: Kristeen Miss, MD;  Location: [facility];  Service: Neurosurgery;  Laterality: N/A;  Lumbar One-Two Anterolateral lumbar decompression/interbody fusion with lateral plate  . BACK SURGERY    . BREAST SURGERY     biopsy  . BUNIONECTOMY Left 2012  . CARPAL TUNNEL RELEASE Right 05/22/2019   Procedure: RIGHT CARPAL TUNNEL RELEASE;  Surgeon: Daryll Brod, MD;  Location: [facility];  Service: Orthopedics;  Laterality: Right;  . CARPOMETACARPEL SUSPENSION PLASTY Right 05/22/2019   Procedure: ARTHROPLASY CARPOMETACARPAL RIGHT THUMB TRAPEZIUM EXCISION TENDON TRANSFER MICRO LINK SUSPENSION;  Surgeon: Daryll Brod, MD;  Location[place] [facility];  Service: Orthopedics;  Laterality: Right;  . CESAREAN SECTION  1986  . COLONOSCOPY W/ BIOPSIES AND POLYPECTOMY    . EYE SURGERY Bilateral 2012   "lasered a hole in my iris cause pressure was building up"  . LAPAROSCOPIC CHOLECYSTECTOMY  1990's  . NASAL SINUS  SURGERY  1980's  . POSTERIOR LUMBAR FUSION  05/2013   L4-5; S1  . POSTERIOR LUMBAR FUSION  11/04/2014   L3-4  . SHOULDER ARTHROSCOPY W/ ROTATOR CUFF REPAIR Right 2012  . TUBAL LIGATION  1992    Family History  Problem Relation Age of Onset  . Lung cancer Mother   . Heart disease Father   . Heart disease Maternal Grandfather        PGF  . Alcohol abuse Maternal Grandfather   . Hyperlipidemia Maternal Grandfather   . Anxiety disorder Sister   . Fibromyalgia Sister   . Nephrolithiasis Daughter     Social History:  reports that she has never smoked. She has never used  smokeless tobacco. She reports current alcohol use of about 1.0 standard drink of alcohol per week. She reports that she does not use drugs. She denies tobacco use. She has 1-2 glasses of wine per week. She denies any exposure to radiation or toxins. She is retired. She worked in [employer] for [employer]. The patient is accompanied by her husband, [name], today.  Allergies:  Allergies  Allergen Reactions  . Ultram [Tramadol] Other (See Comments)    seizures  . Hydrocodone-Acetaminophen Itching  . Adhesive [Tape] Rash    bandaide  . Erythromycin Rash  . Latex Rash  . Neurontin [Gabapentin] Itching  . Other Rash    adhesive  . Sulfonamide Derivatives Rash    Current Medications: Current Outpatient Medications  Medication Sig Dispense Refill  . acetaminophen (TYLENOL) 500 MG tablet Take 1,000 mg by mouth 3 (three) times daily.    [name] amLODipine (NORVASC) 10 MG tablet Take 10 mg by mouth at bedtime.     [name] aspirin EC 81 MG tablet Take 81 mg by mouth at bedtime.    . Belimumab (BENLYSTA IV)     . desvenlafaxine (PRISTIQ) 50 MG 24 hr tablet Take 1 tablet (50 mg total) by mouth daily. 90 tablet 1  . dicyclomine (BENTYL) 20 MG tablet Take 20 mg by mouth daily as needed (abdominal pain).     [name] diphenhydrAMINE HCl, Sleep, (UNISOM SLEEPGELS) 50 MG CAPS Take 50 mg by mouth at bedtime.    . diphenhydrAMINE HCl, Sleep, 50 MG CAPS Unisom (diphenhydramine)    . divalproex (DEPAKOTE ER) 250 MG 24 hr tablet Take 1 tablet (250 mg total) by mouth daily. 90 tablet 3  . Fe Fum-FePoly-Vit C-Vit B3 (INTEGRA PO) Take 1 tablet by mouth daily.    . hydrocortisone (CORTEF) 10 MG tablet Take 10 mg by mouth See admin instructions. Take 15 mg in the morning and 5 mg in the afternoon    . hydroxychloroquine (PLAQUENIL) 200 MG tablet Take 200 mg by mouth 2 (two) times daily.    [name] levocetirizine (XYZAL) 5 MG tablet Take 5 mg by mouth at bedtime.    [name] levothyroxine (SYNTHROID) 200 MCG tablet Take 200 mcg by mouth  daily before breakfast.    . losartan-hydrochlorothiazide (HYZAAR) 100-12.5 MG tablet losartan 100 mg-hydrochlorothiazide 12.5 mg tablet    . Melatonin 10 MG TBDP Take 10 mg by mouth at bedtime.    . niacin (NIASPAN) 500 MG CR tablet Take 500 mg by mouth at bedtime.    . Omega-3 Fatty Acids (OMEGA 3 PO) Take 1 capsule by mouth at bedtime.    . Omeprazole 20 MG TBDD     . ondansetron (ZOFRAN) 4 MG tablet Take 1 tablet (4 mg total) by mouth every 8 (eight) hours  as needed for nausea or vomiting. 20 tablet 0  . oxymetazoline (AFRIN) 0.05 % nasal spray Place 1 spray into both nostrils 2 (two) times daily as needed for congestion.    . Probiotic Product (PROBIOTIC PO) Take 1 capsule by mouth daily.      No current facility-administered medications for this visit.   Facility-Administered Medications Ordered in Other Visits  Medication Dose Route Frequency Provider Last Rate Last Admin  . cyclophosphamide (CYTOXAN) 500 mg in sodium chloride 0.9 % 250 mL chemo infusion  500 mg Intravenous Once Donnivan Villena C, MD      . dexamethasone (DECADRON) 10 mg in sodium chloride 0.9 % 50 mL IVPB  10 mg Intravenous Once Lequita Asal, MD        Review of Systems  Constitutional: Positive for fever (transient low grade s/p chemotherapy). Negative for chills, diaphoresis, malaise/fatigue and weight loss (stable).       Feels "fine".  HENT: Negative for congestion, ear discharge, ear pain, hearing loss, nosebleeds, sinus pain, sore throat and tinnitus.   Eyes: Negative for blurred vision.  Respiratory: Negative for cough, hemoptysis, sputum production and shortness of breath.   Cardiovascular: Negative for chest pain, palpitations and leg swelling.  Gastrointestinal: Positive for heartburn (occasional, on meds). Negative for abdominal pain, blood in stool, constipation, diarrhea, melena, nausea and vomiting.       IBS  Genitourinary: Negative for dysuria, frequency, hematuria and urgency.   Musculoskeletal: Positive for joint pain (arthritis in hands, hip pain). Negative for back pain, myalgias and neck pain.  Skin: Negative for itching and rash.  Neurological: Positive for sensory change (neuropathy in feet, worsening, causes balance problems) and headaches (mild s/p chemotherapy). Negative for dizziness, tingling and weakness.  Endo/Heme/Allergies: Does not bruise/bleed easily.  Psychiatric/Behavioral: Negative for depression and memory loss. The patient is not nervous/anxious and does not have insomnia.   All other systems reviewed and are negative.  Performance status (ECOG): 1  Vitals Blood pressure (!) 156/85, pulse 86, temperature 98.7 F (37.1 C), resp. rate 20, weight 172 lb 4.6 oz (78.1 kg), SpO2 100 %.   Physical Exam Vitals and nursing note reviewed.  Constitutional:      General: She is not in acute distress.    Appearance: She is not diaphoretic.  HENT:     Head: Normocephalic and atraumatic.     Mouth/Throat:     Mouth: Mucous membranes are moist.     Pharynx: Oropharynx is clear.  Eyes:     General: No scleral icterus.    Extraocular Movements: Extraocular movements intact.     Conjunctiva/sclera: Conjunctivae normal.     Pupils: Pupils are equal, round, and reactive to light.  Cardiovascular:     Rate and Rhythm: Normal rate and regular rhythm.     Heart sounds: Normal heart sounds. No murmur heard.   Pulmonary:     Effort: Pulmonary effort is normal. No respiratory distress.     Breath sounds: Normal breath sounds. No wheezing or rales.  Chest:     Chest wall: No tenderness.  Breasts:     Right: No axillary adenopathy or supraclavicular adenopathy.     Left: No axillary adenopathy or supraclavicular adenopathy.    Abdominal:     General: Bowel sounds are normal. There is no distension.     Palpations: Abdomen is soft. There is no mass.     Tenderness: There is no abdominal tenderness. There is no guarding or rebound.  Musculoskeletal:  General: No swelling or tenderness. Normal range of motion.     Cervical back: Normal range of motion and neck supple.  Lymphadenopathy:     Head:     Right side of head: No preauricular, posterior auricular or occipital adenopathy.     Left side of head: No preauricular, posterior auricular or occipital adenopathy.     Cervical: No cervical adenopathy.     Upper Body:     Right upper body: No supraclavicular or axillary adenopathy.     Left upper body: No supraclavicular or axillary adenopathy.     Lower Body: No right inguinal adenopathy. No left inguinal adenopathy.  Skin:    General: Skin is warm and dry.  Neurological:     Mental Status: She is alert and oriented to person, place, and time.  Psychiatric:        Behavior: Behavior normal.        Thought Content: Thought content normal.        Judgment: Judgment normal.    Appointment on 12/16/2020  Component Date Value Ref Range Status  . WBC 12/16/2020 7.0  4.0 - 10.5 K/uL Final  . RBC 12/16/2020 4.30  3.87 - 5.11 MIL/uL Final  . Hemoglobin 12/16/2020 13.2  12.0 - 15.0 g/dL Final  . HCT 12/16/2020 39.1  36.0 - 46.0 % Final  . MCV 12/16/2020 90.9  80.0 - 100.0 fL Final  . MCH 12/16/2020 30.7  26.0 - 34.0 pg Final  . MCHC 12/16/2020 33.8  30.0 - 36.0 g/dL Final  . RDW 12/16/2020 13.6  11.5 - 15.5 % Final  . Platelets 12/16/2020 339  150 - 400 K/uL Final  . nRBC 12/16/2020 0.0  0.0 - 0.2 % Final  . Neutrophils Relative % 12/16/2020 85  % Final  . Neutro Abs 12/16/2020 6.0  1.7 - 7.7 K/uL Final  . Lymphocytes Relative 12/16/2020 9  % Final  . Lymphs Abs 12/16/2020 0.6* 0.7 - 4.0 K/uL Final  . Monocytes Relative 12/16/2020 5  % Final  . Monocytes Absolute 12/16/2020 0.3  0.1 - 1.0 K/uL Final  . Eosinophils Relative 12/16/2020 0  % Final  . Eosinophils Absolute 12/16/2020 0.0  0.0 - 0.5 K/uL Final  . Basophils Relative 12/16/2020 0  % Final  . Basophils Absolute 12/16/2020 0.0  0.0 - 0.1 K/uL Final  . Immature  Granulocytes 12/16/2020 1  % Final  . Abs Immature Granulocytes 12/16/2020 0.04  0.00 - 0.07 K/uL Final   Performed at [facility], [address], [place], [place] [address]  . Color, Urine 12/16/2020 YELLOW  YELLOW Final  . APPearance 12/16/2020 CLEAR  CLEAR Final  . Specific Gravity, Urine 12/16/2020 >1.030* 1.005 - 1.030 Final  . pH 12/16/2020 6.0  5.0 - 8.0 Final  . Glucose, UA 12/16/2020 NEGATIVE  NEGATIVE mg/dL Final  . Hgb urine dipstick 12/16/2020 TRACE* NEGATIVE Final  . Bilirubin Urine 12/16/2020 NEGATIVE  NEGATIVE Final  . Ketones, ur 12/16/2020 NEGATIVE  NEGATIVE mg/dL Final  . Protein, ur 12/16/2020 >300* NEGATIVE mg/dL Final  . Nitrite 12/16/2020 NEGATIVE  NEGATIVE Final  . Chalmers Guest 12/16/2020 NEGATIVE  NEGATIVE Final  . Squamous Epithelial / LPF 12/16/2020 0-5  0 - 5 Final  . WBC, UA 12/16/2020 0-5  0 - 5 WBC/hpf Final  . RBC / HPF 12/16/2020 0-5  0 - 5 RBC/hpf Final  . Bacteria, UA 12/16/2020 NONE SEEN  NONE SEEN Final   Performed at [facility], [phone]  [address], [place], [place] [address]  . Magnesium 12/16/2020 1.8  1.7 - 2.4 mg/dL Final   Performed at [facility], [address], [place], [place] [address]  . Sodium 12/16/2020 136  135 - 145 mmol/L Final  . Potassium 12/16/2020 3.3* 3.5 - 5.1 mmol/L Final  . Chloride 12/16/2020 101  98 - 111 mmol/L Final  . CO2 12/16/2020 23  22 - 32 mmol/L Final  . Glucose, Bld 12/16/2020 99  70 - 99 mg/dL Final   Glucose reference range applies only to samples taken after fasting for at least 8 hours.  . BUN 12/16/2020 26* 8 - 23 mg/dL Final  . Creatinine, Ser 12/16/2020 1.03* 0.44 - 1.00 mg/dL Final  . Calcium 12/16/2020 9.5  8.9 - 10.3 mg/dL Final  . Total Protein 12/16/2020 7.6  6.5 - 8.1 g/dL Final  . Albumin 12/16/2020 3.9  3.5 - 5.0 g/dL Final  . AST 12/16/2020 33  15 - 41 U/L Final  . ALT 12/16/2020 20  0 - 44 U/L Final  . Alkaline Phosphatase 12/16/2020 62  38 - 126 U/L  Final  . Total Bilirubin 12/16/2020 0.4  0.3 - 1.2 mg/dL Final  . GFR, Estimated 12/16/2020 >60  >60 mL/min Final   Comment: (NOTE) Calculated using the CKD-EPI Creatinine Equation (2021)   . Anion gap 12/16/2020 12  5 - 15 Final   Performed at [facility], [address] [place], [place] [address]    Assessment:  Crystal Larsen is a 65 y.o. female with systemic lupus erythematosis (SLE) and lupus nephritis.  She completed a course of prednisone.  She is on Benlysta (began 04/2020).  Right kidney biopsy on 08/31/2020 revealed pauci immune focal necrotizing and early sclerosing glomerulonephritis with segments of tuft inflammation and rare focal tuft necrosis and less that 5% focal fibrocellular crescent formation (in one glomerulus). She is felt to have a lupus IV-S lesion.  ANCA, anti-GBM was normal.  She is s/p cycle #3 Cytoxan (11/04/2020 - 12/02/2020).  Protein creatinine ratio has been followed: 2.51 on 11/03/2020, 0.49 on 11/18/2020, 0.78 on 12/02/2020, and 1.47 on 12/16/2020.  She has inflammatory arthritis, adrenal insufficiency,and hypothyroidism secondary to Hashimoto's thyroiditis.  Symptomatically, she feels fine. She had a brief very low grade fever and mild headache after her last treatment. Symptoms resolved with Tylenol. With treatment, she has felt "wiped out" for a longer period of time (last cycle 3 days).  Plan: 1.   Labs today: CBC with diff, CMP, Mg, urinalysis, protein/creatine ratio. 2.   Lupus nephritis             Clinically, she denies any complaint.             Exam is unremarkable.  Urine protein  > 300 mg/dL. Protein creatinine ratio has increased.              She is s/p 3 cycles of Cytoxan (last 05/02/2021).             Plan is cyclophosphamide 500 mg IV every 2 weeks x 6.             Labs reviewed.  Cycle #4 Cytoxan today. 3.   Hypokalemia  Potassium 3.3.  Discuss foods rich in potassium. 4.   Cycle #4 Cytoxan today. 5.   RTC in 2  weeks for MD assessment, labs (CBC with diff, CMP, Mg, urinalysis, protein/creatine ratio), and cycle #5 Cytoxan.  I discussed the assessment and treatment  plan with the patient.  The patient was provided an opportunity to ask questions and all were answered.  The patient agreed with the plan and demonstrated an understanding of the instructions.  The patient was advised to call back if the symptoms worsen or if the condition fails to improve as anticipated.    Keriana Sarsfield C. Mike Gip, MD, PhD    12/16/2020, 9:10 AM  I, Mirian Mo Tufford, am acting as Education administrator for [employer]. Mike Gip, MD, PhD.  I, Aneliese Beaudry C. Mike Gip, MD, have reviewed the above documentation for accuracy and completeness, and I agree with the above.

## 2020-12-16 ENCOUNTER — Inpatient Hospital Stay: Payer: Medicare Other

## 2020-12-16 ENCOUNTER — Other Ambulatory Visit: Payer: Self-pay

## 2020-12-16 ENCOUNTER — Encounter: Payer: Self-pay | Admitting: Hematology and Oncology

## 2020-12-16 ENCOUNTER — Inpatient Hospital Stay: Payer: Medicare Other | Admitting: Hematology and Oncology

## 2020-12-16 VITALS — BP 156/85 | HR 86 | Temp 98.7°F | Resp 20 | Wt 172.3 lb

## 2020-12-16 DIAGNOSIS — Z5111 Encounter for antineoplastic chemotherapy: Secondary | ICD-10-CM

## 2020-12-16 DIAGNOSIS — M3214 Glomerular disease in systemic lupus erythematosus: Secondary | ICD-10-CM

## 2020-12-16 LAB — CBC WITH DIFFERENTIAL/PLATELET
Abs Immature Granulocytes: 0.04 10*3/uL (ref 0.00–0.07)
Basophils Absolute: 0 10*3/uL (ref 0.0–0.1)
Basophils Relative: 0 %
Eosinophils Absolute: 0 10*3/uL (ref 0.0–0.5)
Eosinophils Relative: 0 %
HCT: 39.1 % (ref 36.0–46.0)
Hemoglobin: 13.2 g/dL (ref 12.0–15.0)
Immature Granulocytes: 1 %
Lymphocytes Relative: 9 %
Lymphs Abs: 0.6 10*3/uL — ABNORMAL LOW (ref 0.7–4.0)
MCH: 30.7 pg (ref 26.0–34.0)
MCHC: 33.8 g/dL (ref 30.0–36.0)
MCV: 90.9 fL (ref 80.0–100.0)
Monocytes Absolute: 0.3 10*3/uL (ref 0.1–1.0)
Monocytes Relative: 5 %
Neutro Abs: 6 10*3/uL (ref 1.7–7.7)
Neutrophils Relative %: 85 %
Platelets: 339 10*3/uL (ref 150–400)
RBC: 4.3 MIL/uL (ref 3.87–5.11)
RDW: 13.6 % (ref 11.5–15.5)
WBC: 7 10*3/uL (ref 4.0–10.5)
nRBC: 0 % (ref 0.0–0.2)

## 2020-12-16 LAB — COMPREHENSIVE METABOLIC PANEL
ALT: 20 U/L (ref 0–44)
AST: 33 U/L (ref 15–41)
Albumin: 3.9 g/dL (ref 3.5–5.0)
Alkaline Phosphatase: 62 U/L (ref 38–126)
Anion gap: 12 (ref 5–15)
BUN: 26 mg/dL — ABNORMAL HIGH (ref 8–23)
CO2: 23 mmol/L (ref 22–32)
Calcium: 9.5 mg/dL (ref 8.9–10.3)
Chloride: 101 mmol/L (ref 98–111)
Creatinine, Ser: 1.03 mg/dL — ABNORMAL HIGH (ref 0.44–1.00)
GFR, Estimated: >60 mL/min (ref >60)
Glucose, Bld: 99 mg/dL (ref 70–99)
Potassium: 3.3 mmol/L — ABNORMAL LOW (ref 3.5–5.1)
Sodium: 136 mmol/L (ref 135–145)
Total Bilirubin: 0.4 mg/dL (ref 0.3–1.2)
Total Protein: 7.6 g/dL (ref 6.5–8.1)

## 2020-12-16 LAB — URINALYSIS, COMPLETE (UACMP) WITH MICROSCOPIC
Bacteria, UA: NONE SEEN
Bilirubin Urine: NEGATIVE
Glucose, UA: NEGATIVE mg/dL
Ketones, ur: NEGATIVE mg/dL
Leukocytes,Ua: NEGATIVE
Nitrite: NEGATIVE
Protein, ur: >300 mg/dL — AB
Specific Gravity, Urine: >1.03 — ABNORMAL HIGH (ref 1.005–1.030)
pH: 6 (ref 5.0–8.0)

## 2020-12-16 LAB — MAGNESIUM: Magnesium: 1.8 mg/dL (ref 1.7–2.4)

## 2020-12-16 LAB — PROTEIN / CREATININE RATIO, URINE
Creatinine, Urine: 114 mg/dL
Protein Creatinine Ratio: 1.47 mg/mg{Cre} — ABNORMAL HIGH (ref 0.00–0.15)
Total Protein, Urine: 168 mg/dL

## 2020-12-16 MED ORDER — SODIUM CHLORIDE 0.9 % IV SOLN
10.0000 mg | Freq: Once | INTRAVENOUS | Status: AC
  Administered 2020-12-16: 10 mg via INTRAVENOUS
  Filled 2020-12-16: qty 10

## 2020-12-16 MED ORDER — SODIUM CHLORIDE 0.9 % IV SOLN
Freq: Once | INTRAVENOUS | Status: AC
  Filled 2020-12-16: qty 250

## 2020-12-16 MED ORDER — PALONOSETRON HCL INJECTION 0.25 MG/5ML
0.2500 mg | Freq: Once | INTRAVENOUS | Status: AC
  Administered 2020-12-16: 0.25 mg via INTRAVENOUS
  Filled 2020-12-16: qty 5

## 2020-12-16 MED ORDER — SODIUM CHLORIDE 0.9 % IV SOLN
500.0000 mg | Freq: Once | INTRAVENOUS | Status: AC
  Administered 2020-12-16: 500 mg via INTRAVENOUS
  Filled 2020-12-16: qty 25

## 2020-12-16 NOTE — Progress Notes (Signed)
Patient received cytoxan per treatment plan.  Patient stable at discharge.

## 2020-12-27 ENCOUNTER — Other Ambulatory Visit: Payer: Self-pay

## 2020-12-27 DIAGNOSIS — M3219 Other organ or system involvement in systemic lupus erythematosus: Secondary | ICD-10-CM

## 2020-12-27 DIAGNOSIS — Z5111 Encounter for antineoplastic chemotherapy: Secondary | ICD-10-CM

## 2020-12-29 ENCOUNTER — Other Ambulatory Visit: Payer: Self-pay

## 2020-12-29 ENCOUNTER — Encounter: Payer: Self-pay | Admitting: Physical Therapy

## 2020-12-29 ENCOUNTER — Ambulatory Visit: Payer: Medicare Other | Attending: Orthopedic Surgery | Admitting: Physical Therapy

## 2020-12-29 DIAGNOSIS — M25551 Pain in right hip: Secondary | ICD-10-CM | POA: Diagnosis not present

## 2020-12-29 DIAGNOSIS — M6281 Muscle weakness (generalized): Secondary | ICD-10-CM | POA: Insufficient documentation

## 2020-12-29 DIAGNOSIS — M545 Low back pain, unspecified: Secondary | ICD-10-CM | POA: Diagnosis present

## 2020-12-29 DIAGNOSIS — G8929 Other chronic pain: Secondary | ICD-10-CM | POA: Insufficient documentation

## 2020-12-29 DIAGNOSIS — R2681 Unsteadiness on feet: Secondary | ICD-10-CM | POA: Diagnosis present

## 2020-12-29 NOTE — Therapy (Signed)
[facility] [address], [place], [place], [address] Phone: [phone]   Fax:  [phone]  Physical Therapy Evaluation  Patient Details  Name: Crystal Larsen MRN: [id number] Date of Birth: 10/04/1956 Referring Provider (PT): Rod Can, MD   Encounter Date: 12/29/2020    Past Medical History:  Diagnosis Date  . Acute kidney injury ([place])   . Adrenal insufficiency ([place])   . Anemia   . Anxiety   . Arthritis    "left foot; left hand; right shoulder; back" (11/04/2014)  . Elevated liver enzymes   . Gallstones   . GERD (gastroesophageal reflux disease)   . History of gout   . HTN (hypertension)   . Hypothyroidism   . IBS (irritable bowel syndrome)    ?  [name] Lupus ([place]) 07/2019  . Metabolic acidosis   . Migraine    "maybe once/yr" (11/04/2014)  . Neuropathy    right leg  . Obesity   . Pancreatitis   . Pancreatitis   . PONV (postoperative nausea and vomiting) 1981 X 1   only time  . Seizures ([place]) 2003    due to tramadol reaction -- takes depakote for migraine prevention  . Sleep apnea    does not use cpap  . Spondylolisthesis of lumbar region   . Unsteady gait   . Wears glasses     Past Surgical History:  Procedure Laterality Date  . ANKLE GANGLION CYST EXCISION Right 1980's  . ANTERIOR LAT LUMBAR FUSION N/A 12/09/2019   Procedure: Lumbar One-Two Anterolateral lumbar decompression/interbody fusion with lateral plate;  Surgeon: Kristeen Miss, MD;  Location: [facility];  Service: Neurosurgery;  Laterality: N/A;  Lumbar One-Two Anterolateral lumbar decompression/interbody fusion with lateral plate  . BACK SURGERY    . BREAST SURGERY     biopsy  . BUNIONECTOMY Left 2012  . CARPAL TUNNEL RELEASE Right 05/22/2019   Procedure: RIGHT CARPAL TUNNEL RELEASE;  Surgeon: Daryll Brod, MD;  Location: [facility];  Service: Orthopedics;  Laterality: Right;  . CARPOMETACARPEL SUSPENSION PLASTY Right  05/22/2019   Procedure: ARTHROPLASY CARPOMETACARPAL RIGHT THUMB TRAPEZIUM EXCISION TENDON TRANSFER MICRO LINK SUSPENSION;  Surgeon: Daryll Brod, MD;  Location: [facility];  Service: Orthopedics;  Laterality: Right;  . CESAREAN SECTION  1986  . COLONOSCOPY W/ BIOPSIES AND POLYPECTOMY    . EYE SURGERY Bilateral 2012   "lasered a hole in my iris cause pressure was building up"  . LAPAROSCOPIC CHOLECYSTECTOMY  1990's  . NASAL SINUS SURGERY  1980's  . POSTERIOR LUMBAR FUSION  05/2013   L4-5; S1  . POSTERIOR LUMBAR FUSION  11/04/2014   L3-4  . SHOULDER ARTHROSCOPY W/ ROTATOR CUFF REPAIR Right 2012  . TUBAL LIGATION  1992    There were no vitals filed for this visit.    Subjective Assessment - 12/29/20 1102    Subjective Pt referred to PT for Rt hip pain related to trochanteric bursitis.  Pt has had 4 back surgeries, last one 11/2019, and has fusion L2-S1.  Pt had an injection in Rt hip which did not help.  Pt has pain in Rt anterior thigh, bil groin.  Pt is using cane due to feeling unsteady for balance.  Pt gets fatigue-related pain in bil buttocks and back related to back surgery.    Pertinent History Lupus, lumbar fusion L2-S1 (4 surgeries)    Limitations Sitting;Walking;Standing    How long can you sit comfortably? recliner helps, if sitting straight 5  min    How long can you stand comfortably? back gets tired, 20 min    How long can you walk comfortably? it's always painful    Diagnostic tests lumbar MRI 2021: mild retrolisthesis L1/2 with some foraminal narrowing    Patient Stated Goals improve pain, will be new grandparents in April and hopes to be involved    Currently in Pain? Yes    Pain Score 1    walking up to 4/10, can get stabbing in Lt hip too that is quick high level pain   Pain Location Hip    Pain Orientation Right;Anterior;Medial;Posterior    Pain Descriptors / Indicators Aching    Pain Type Chronic pain    Pain Onset More than a month ago    Pain Frequency  Intermittent    Aggravating Factors  walking, sitting, standing    Pain Relieving Factors recliner    Effect of Pain on Daily Activities limited walking, limited household activities              [facility] PT Assessment - 12/29/20 0001      Assessment   Medical Diagnosis M70.61 (ICD-10-CM) - Trochanteric bursitis, right hip    Referring Provider (PT) Rod Can, MD    Onset Date/Surgical Date --   April 2021, worsened in Fall/Winter 2021   Hand Dominance Right    Next MD Visit early April    Prior Therapy yes      Precautions   Precautions Back    Precaution Comments fusion L2-S1      Restrictions   Weight Bearing Restrictions No      Balance Screen   Has the patient fallen in the past 6 months No    Has the patient had a decrease in activity level because of a fear of falling?  No    Is the patient reluctant to leave their home because of a fear of falling?  No      Home Environment   Living Environment Private residence    Living Arrangements Spouse/significant other    Type of [place] to enter    Entrance Stairs-Number of Steps 2    Entrance Stairs-Rails None    Home Layout One level    [place] - single point      Prior Function   Level of Independence Independent    Vocation Retired    Leisure read      Cognition   Overall Cognitive Status Within Functional Limits for tasks assessed      Observation/Other Assessments   Focus on Therapeutic Outcomes (FOTO)  33%, goal 50%      Functional Tests   Functional tests Sit to Stand      Sit to Stand   Comments light use of UEs, can do without UEs but some groin pain      ROM / Strength   AROM / PROM / Strength AROM;PROM;Strength      AROM   Overall AROM Comments lumbar trunk limited secondary to fusion L2-S1    AROM Assessment Site Lumbar    Lumbar Flexion hands to knees using hip hinge, hamstring, buttock and lumbar pulling stretch reported    Lumbar Extension not  tested    Lumbar - Right Side Bend limited 50%, thoracic only    Lumbar - Left Side Bend limited 50%, thoracic only    Lumbar - Right Rotation not tested    Lumbar - Left Rotation  not tested      PROM   Overall PROM Comments bil hip ROM WFL, groin pain on flexion and IR bil      Strength   Overall Strength Comments LE strength 4/5 on testing, closed chain control is poor/fair: unable to march or stand in SLS      Flexibility   Soft Tissue Assessment /Muscle Length yes    Hamstrings end range limited last 10 deg bil      Palpation   Spinal mobility fusion L2-S1    SI assessment  WFL    Palpation comment tender: bil SI joints, gluteals, piriformis, adductors, ITB      Special Tests    Special Tests Hip Special Tests    Hip Special Tests  Saralyn Pilar (FABER) Test;Other;Hip Scouring      Saralyn Pilar (FABER) Test   Findings Positive    Comments bil      Hip Scouring   Findings Positive    Comments bil for IR/flexion quadrant      other   Findings Positive    Comments FADIR test, + bil      Transfers   Comments ind with bed mobility      Ambulation/Gait   Ambulation Distance (Feet) 20 Feet    Assistive device Straight cane    Gait Pattern Step-through pattern;Trendelenburg;Decreased weight shift to right;Decreased stance time - right    Gait Comments Lt UE cane use, lateral trunk flexion during stance phase      Standardized Balance Assessment   Standardized Balance Assessment Five Times Sit to Stand    Five times sit to stand comments  15 sec, no UE use, some eccentric control fatigue end of reps                      Objective measurements completed on examination: See above findings.               PT Education - 12/29/20 1146    Education Details Access Code: [email or web address]            PT Short Term Goals - 12/29/20 1257      PT SHORT TERM GOAL #1   Title Pt will be ind with initial HEP    Time 3    Period Weeks    Status New    Target Date  01/19/21      PT SHORT TERM GOAL #2   Title Pt will demo improved gait pattern to include improved weight shifting into Rt LE and reduce degree of Trendelenburg bil secondary to improved gluteal use in closed chain.    Time 4    Period Weeks    Status New    Target Date 01/26/21      PT SHORT TERM GOAL #3   Title Pt will report reduced pain with household ambulation by at least 30%    Time 4    Period Weeks    Status New    Target Date 01/26/21      PT SHORT TERM GOAL #4   Title Pt will be able to sit in upright chair (vs recliner) for at least 15 mintues for mealtime with at least 30% less pain.      PT SHORT TERM GOAL #5   Title Pt will demo 5x sit to stand without UE use in </= 13 sec    Baseline 15 sec, use of UEs    Time 4    Period Weeks  Status New    Target Date 01/26/21             PT Long Term Goals - 12/29/20 1303      PT LONG TERM GOAL #1   Title Pt will report improved endurance for light ADLs and household ambulation for up to 20 min before needing seated break.    Time 8    Period Weeks    Status New    Target Date 02/23/21      PT LONG TERM GOAL #2   Title Patient able to safely amb community distances using cane.    Time 8    Period Weeks    Status New    Target Date 02/23/21      PT LONG TERM GOAL #3   Title Improved Bil hip strength to >= 4+/5 to improve overall function    Baseline bil hips 4/5    Time 8    Period Weeks    Status New    Target Date 02/23/21      PT LONG TERM GOAL #4   Title Improved 5x sit to stand to </= 11 sec to reduce fall risk    Baseline 15 sec    Time 8    Period Weeks    Status New    Target Date 02/23/21      PT LONG TERM GOAL #5   Title Improved FOTO to >/= 50% to demo improved function    Baseline 33%    Time 8    Period Weeks    Status New    Target Date 02/23/21                  Plan - 12/29/20 1308    Clinical Impression Statement Pt is a pleasant 64yo returning patient with history  of chronic LBP and bil hip pain.  Pt has had 4 lumbar surgeries and is fused from L2-S1.  PMH includes Lupus.  Referral diagnosis is Rt trochanteric bursitis.  Pt has ongoing bil groin pain, Rt anterior thigh pain related to her hips which is worse with walking, sitting in upright chair (recliner is better), and standing.  She ambulates with straight cane in Lt UE with + Trendelenburg bil, limited weight shift to Rt LE and stance time on Rt LE.  Trunk ROM is limited secondary to fusion but [facility] for ADLs.  Pt is unable to perform standing march or SLS secondary to pain and weakness.  She uses UEs for sit to stand to reduce pain with this.  5x sit to stand is 15 sec.  LE strength is 4/5 throughout.  Hip special tests are + bil for Scour, FADIR and FABER.  She has diffuse tenderness surrounding bil SI joints and hip musculature.  FOTO intake score of 33% with goal of 50%.  Pt goals are to reduce pain and be able to actively participate in becoming a new grandparent this Spring.  Pt will benefit from functional mobility, LE strength, gait training and postural strength to improve tolerance and safety with daily functional activities.    Personal Factors and Comorbidities Comorbidity 1;Comorbidity 2;Time since onset of injury/illness/exacerbation    Comorbidities lumbar fusion L2-S1, Lupus    Examination-Activity Limitations Locomotion Level;Sit;Lift;Stairs;Stand;Bend    Examination-Participation Restrictions Meal Prep;Laundry;Shop;Community Activity;Cleaning    Stability/Clinical Decision Making Stable/Uncomplicated    Clinical Decision Making Low    Rehab Potential Good    PT Frequency 2x / week    PT Duration 8  weeks    PT Treatment/Interventions ADLs/Self Care Home Management;Cryotherapy;Electrical Stimulation;Moist Heat;Iontophoresis 4mg /ml Dexamethasone;Gait training;Stair training;Functional mobility training;Therapeutic exercise;Therapeutic activities;Neuromuscular re-education;Balance  training;Patient/family education;Energy conservation;Passive range of motion;Manual techniques;Taping;Spinal Manipulations;Joint Manipulations    PT Next Visit Plan NuStep, review HEP, pre-gait activities for closed chain weight shifting, core and hip strength as tol    PT Home Exercise Plan Access Code: [email or web address]    Consulted and Agree with Plan of Care Patient           Patient will benefit from skilled therapeutic intervention in order to improve the following deficits and impairments:  Abnormal gait,Decreased range of motion,Difficulty walking,Decreased endurance,Pain,Decreased activity tolerance,Impaired flexibility,Decreased balance,Decreased mobility,Decreased strength  Visit Diagnosis: Pain in right hip - Plan: PT plan of care cert/re-cert  Chronic bilateral low back pain, unspecified whether sciatica present - Plan: PT plan of care cert/re-cert  Muscle weakness (generalized) - Plan: PT plan of care cert/re-cert  Unsteadiness on feet - Plan: PT plan of care cert/re-cert     Problem List Patient Active Problem List   Diagnosis Date Noted  . Encounter for antineoplastic chemotherapy 11/18/2020  . Systemic lupus erythematosus ([place]) 11/03/2020  . Lupus nephritis ([place]) 11/03/2020  . Proteinuria 11/03/2020  . Other spondylosis with radiculopathy, lumbar region 12/09/2019  . Secondary adrenal insufficiency ([place]) 04/03/2019  . Metabolic acidosis, normal anion gap (NAG) 04/02/2019  . HTN (hypertension)   . Adrenal insufficiency ([place]) 04/01/2019  . Hyponatremia 04/01/2019  . Hypothyroidism 04/01/2019  . AKI (acute kidney injury) ([place]) 04/01/2019  . Lumbar stenosis with neurogenic claudication 02/19/2017  . Migraine with aura and without status migrainosus, not intractable 07/11/2016  . Spondylolisthesis of lumbar region 11/04/2014  . Postconcussional syndrome 09/30/2013  . Post-concussion headache 09/30/2013  . Esophageal reflux 10/11/2011  . Benign neoplasm of colon  10/11/2011  . Gastritis, chronic 10/11/2011  . Guaiac + stool 09/28/2011  . IBS (irritable bowel syndrome) 09/28/2011  . GERD (gastroesophageal reflux disease) 09/28/2011  . Migraine syndrome 09/28/2011    Venetia Night E Beuhring 12/29/2020, 1:22 PM  [facility] [address] [place], [place], [address] Phone: [phone]   Fax:  [phone]  Name: AMNERIS GRIVAS MRN: [id number] Date of Birth: [birth year]

## 2020-12-29 NOTE — Patient Instructions (Signed)
Access Code: [email or web address] URL: [email or web address] Date: 12/29/2020 Prepared by: Venetia Night Ladana Chavero  Exercises Supine Hamstring Stretch with Strap - 1 x daily - 7 x weekly - 1 sets - 3 reps - 30 hold Supine Posterior Pelvic Tilt - 1 x daily - 7 x weekly - 2 sets - 10 reps - 5 hold Sit to Stand - 1 x daily - 7 x weekly - 3 sets - 5 reps Seated Piriformis Stretch with Trunk Bend - 1 x daily - 7 x weekly - 1 sets - 3 reps - 30 hold

## 2020-12-29 NOTE — Progress Notes (Signed)
[facility]  [address] [place], [place] [address] Phone: [phone]  Fax: [phone]   Clinic Day:  12/30/2020  Referring physician: Jonathon Jordan, MD  Chief Complaint: Crystal Larsen is a 65 y.o. female with lupus nephritis and stage III chronic kidney disease who is seen for assessment prior to cycle #5 Cytoxan.  HPI: The patient was last seen in the hematology clinic on 12/17/2019. At that time, she felt fine. She had a brief very low grade fever and mild headache after her last treatment. Symptoms resolved with Tylenol. With treatment, she had felt "wiped out" for a longer period of time (3 days). Hematocrit was 39.1, hemoglobin 13.2, platelets 339,000, WBC 7,000. Potassium was 3.3. Creatinine was 1.03 (CrCl >60 ml/min). Protein/Creatinine ratio was 1.47. UA showed trace hemoglobin and protein >300 mg/dL. She received cycle #4 Cytoxan.  During the interim, she has been "good." She did better after her last cycle of treatment. Her worst day was 2 days after treatment, and she just felt tired. She takes a lot of naps. She denies fevers, headaches, nausea, and vomiting.  Her heartburn has improved. Her joint pain and neuropathy are stable.   Past Medical History:  Diagnosis Date  . Acute kidney injury ([employer])   . Adrenal insufficiency ([place])   . Anemia   . Anxiety   . Arthritis    "left foot; left hand; right shoulder; back" (11/04/2014)  . Elevated liver enzymes   . Gallstones   . GERD (gastroesophageal reflux disease)   . History of gout   . HTN (hypertension)   . Hypothyroidism   . IBS (irritable bowel syndrome)    ?  [name] Lupus ([place]) 07/2019  . Metabolic acidosis   . Migraine    "maybe once/yr" (11/04/2014)  . Neuropathy    right leg  . Obesity   . Pancreatitis   . Pancreatitis   . PONV (postoperative nausea and vomiting) 1981 X 1   only time  . Seizures ([place]) 2003    due to tramadol reaction -- takes depakote for migraine  prevention  . Sleep apnea    does not use cpap  . Spondylolisthesis of lumbar region   . Unsteady gait   . Wears glasses     Past Surgical History:  Procedure Laterality Date  . ANKLE GANGLION CYST EXCISION Right 1980's  . ANTERIOR LAT LUMBAR FUSION N/A 12/09/2019   Procedure: Lumbar One-Two Anterolateral lumbar decompression/interbody fusion with lateral plate;  Surgeon: Kristeen Miss, MD;  Location: [place];  Service: Neurosurgery;  Laterality: N/A;  Lumbar One-Two Anterolateral lumbar decompression/interbody fusion with lateral plate  . BACK SURGERY    . BREAST SURGERY     biopsy  . BUNIONECTOMY Left 2012  . CARPAL TUNNEL RELEASE Right 05/22/2019   Procedure: RIGHT CARPAL TUNNEL RELEASE;  Surgeon: Daryll Brod, MD;  Location: [place];  Service: Orthopedics;  Laterality: Right;  . CARPOMETACARPEL SUSPENSION PLASTY Right 05/22/2019   Procedure: ARTHROPLASY CARPOMETACARPAL RIGHT THUMB TRAPEZIUM EXCISION TENDON TRANSFER MICRO LINK SUSPENSION;  Surgeon: Daryll Brod, MD;  Location: [place];  Service: Orthopedics;  Laterality: Right;  . CESAREAN SECTION  1986  . COLONOSCOPY W/ BIOPSIES AND POLYPECTOMY    . EYE SURGERY Bilateral 2012   "lasered a hole in my iris cause pressure was building up"  . LAPAROSCOPIC CHOLECYSTECTOMY  1990's  . NASAL SINUS SURGERY  1980's  . POSTERIOR LUMBAR FUSION  05/2013   L4-5; S1  .  POSTERIOR LUMBAR FUSION  11/04/2014   L3-4  . SHOULDER ARTHROSCOPY W/ ROTATOR CUFF REPAIR Right 2012  . TUBAL LIGATION  1992    Family History  Problem Relation Age of Onset  . Lung cancer Mother   . Heart disease Father   . Heart disease Maternal Grandfather        PGF  . Alcohol abuse Maternal Grandfather   . Hyperlipidemia Maternal Grandfather   . Anxiety disorder Sister   . Fibromyalgia Sister   . Nephrolithiasis Daughter     Social History:  reports that she has never smoked. She has never used smokeless tobacco. She reports current  alcohol use of about 1.0 standard drink of alcohol per week. She reports that she does not use drugs. She denies tobacco use. She has 1-2 glasses of wine per week. She denies any exposure to radiation or toxins. She is retired. She worked in [employer] for [employer]. The patient is accompanied by her husband, [name], today.  Allergies:  Allergies  Allergen Reactions  . Ultram [Tramadol] Other (See Comments)    seizures  . Hydrocodone-Acetaminophen Itching  . Adhesive [Tape] Rash    bandaide  . Erythromycin Rash  . Latex Rash  . Neurontin [Gabapentin] Itching  . Other Rash    adhesive  . Sulfonamide Derivatives Rash    Current Medications: Current Outpatient Medications  Medication Sig Dispense Refill  . acetaminophen (TYLENOL) 500 MG tablet Take 1,000 mg by mouth 3 (three) times daily.    [name] amLODipine (NORVASC) 10 MG tablet Take 10 mg by mouth at bedtime.     [name] aspirin EC 81 MG tablet Take 81 mg by mouth at bedtime.    . Belimumab (BENLYSTA IV)     . desvenlafaxine (PRISTIQ) 50 MG 24 hr tablet Take 1 tablet (50 mg total) by mouth daily. 90 tablet 1  . dicyclomine (BENTYL) 20 MG tablet Take 20 mg by mouth daily as needed (abdominal pain).     [name] diphenhydrAMINE HCl, Sleep, (UNISOM SLEEPGELS) 50 MG CAPS Take 50 mg by mouth at bedtime.    . diphenhydrAMINE HCl, Sleep, 50 MG CAPS Unisom (diphenhydramine)    . divalproex (DEPAKOTE ER) 250 MG 24 hr tablet Take 1 tablet (250 mg total) by mouth daily. 90 tablet 3  . Fe Fum-FePoly-Vit C-Vit B3 (INTEGRA PO) Take 1 tablet by mouth daily.    . hydrocortisone (CORTEF) 10 MG tablet Take 10 mg by mouth See admin instructions. Take 15 mg in the morning and 5 mg in the afternoon    . hydroxychloroquine (PLAQUENIL) 200 MG tablet Take 200 mg by mouth 2 (two) times daily.    [name] levocetirizine (XYZAL) 5 MG tablet Take 5 mg by mouth at bedtime.    [name] levothyroxine (SYNTHROID) 200 MCG tablet Take 200 mcg by mouth daily before breakfast.    .  losartan-hydrochlorothiazide (HYZAAR) 100-12.5 MG tablet losartan 100 mg-hydrochlorothiazide 12.5 mg tablet    . Melatonin 10 MG TBDP Take 10 mg by mouth at bedtime.    . niacin (NIASPAN) 500 MG CR tablet Take 500 mg by mouth at bedtime.    . Omega-3 Fatty Acids (OMEGA 3 PO) Take 1 capsule by mouth at bedtime.    . Omeprazole 20 MG TBDD     . ondansetron (ZOFRAN) 4 MG tablet Take 1 tablet (4 mg total) by mouth every 8 (eight) hours as needed for nausea or vomiting. 20 tablet 0  . oxymetazoline (AFRIN) 0.05 % nasal  spray Place 1 spray into both nostrils 2 (two) times daily as needed for congestion.    . Probiotic Product (PROBIOTIC PO) Take 1 capsule by mouth daily.      No current facility-administered medications for this visit.    Review of Systems  Constitutional: Negative for chills, diaphoresis, fever, malaise/fatigue and weight loss (up 2 lbs).       Feels "good."  HENT: Negative for congestion, ear discharge, ear pain, hearing loss, nosebleeds, sinus pain, sore throat and tinnitus.   Eyes: Negative for blurred vision.  Respiratory: Negative for cough, hemoptysis, sputum production and shortness of breath.   Cardiovascular: Negative for chest pain, palpitations and leg swelling.  Gastrointestinal: Positive for heartburn (on meds, improved). Negative for abdominal pain, blood in stool, constipation, diarrhea, melena, nausea and vomiting.       IBS  Genitourinary: Negative for dysuria, frequency, hematuria and urgency.  Musculoskeletal: Positive for joint pain (arthritis in hands, hip pain). Negative for back pain, myalgias and neck pain.  Skin: Negative for itching and rash.  Neurological: Positive for sensory change (neuropathy in feet, worsening, causes balance problems). Negative for dizziness, tingling, weakness and headaches.  Endo/Heme/Allergies: Does not bruise/bleed easily.  Psychiatric/Behavioral: Negative for depression and memory loss. The patient is not nervous/anxious and  does not have insomnia.   All other systems reviewed and are negative.  Performance status (ECOG): 0-1  Vitals Blood pressure (!) 144/82, pulse 80, temperature 98.9 F (37.2 C), temperature source Tympanic, resp. rate 16, weight 174 lb 9.7 oz (79.2 kg), SpO2 100 %.   Physical Exam Vitals and nursing note reviewed.  Constitutional:      General: She is not in acute distress.    Appearance: She is not diaphoretic.  HENT:     Head: Normocephalic and atraumatic.     Mouth/Throat:     Mouth: Mucous membranes are moist.     Pharynx: Oropharynx is clear.  Eyes:     General: No scleral icterus.    Extraocular Movements: Extraocular movements intact.     Conjunctiva/sclera: Conjunctivae normal.     Pupils: Pupils are equal, round, and reactive to light.  Cardiovascular:     Rate and Rhythm: Normal rate and regular rhythm.     Heart sounds: Normal heart sounds. No murmur heard.   Pulmonary:     Effort: Pulmonary effort is normal. No respiratory distress.     Breath sounds: Normal breath sounds. No wheezing or rales.  Chest:     Chest wall: No tenderness.  Breasts:     Right: No axillary adenopathy or supraclavicular adenopathy.     Left: No axillary adenopathy or supraclavicular adenopathy.    Abdominal:     General: Bowel sounds are normal. There is no distension.     Palpations: Abdomen is soft. There is no mass.     Tenderness: There is no abdominal tenderness. There is no guarding or rebound.  Musculoskeletal:        General: No swelling or tenderness. Normal range of motion.     Cervical back: Normal range of motion and neck supple.  Lymphadenopathy:     Head:     Right side of head: No preauricular, posterior auricular or occipital adenopathy.     Left side of head: No preauricular, posterior auricular or occipital adenopathy.     Cervical: No cervical adenopathy.     Upper Body:     Right upper body: No supraclavicular or axillary adenopathy.  Left upper body: No  supraclavicular or axillary adenopathy.     Lower Body: No right inguinal adenopathy. No left inguinal adenopathy.  Skin:    General: Skin is warm and dry.  Neurological:     Mental Status: She is alert and oriented to person, place, and time.  Psychiatric:        Behavior: Behavior normal.        Thought Content: Thought content normal.        Judgment: Judgment normal.    Appointment on 12/30/2020  Component Date Value Ref Range Status  . Sodium 12/30/2020 134* 135 - 145 mmol/L Final  . Potassium 12/30/2020 3.4* 3.5 - 5.1 mmol/L Final  . Chloride 12/30/2020 100  98 - 111 mmol/L Final  . CO2 12/30/2020 21* 22 - 32 mmol/L Final  . Glucose, Bld 12/30/2020 56* 70 - 99 mg/dL Final   Glucose reference range applies only to samples taken after fasting for at least 8 hours.  . BUN 12/30/2020 22  8 - 23 mg/dL Final  . Creatinine, Ser 12/30/2020 0.89  0.44 - 1.00 mg/dL Final  . Calcium 12/30/2020 8.8* 8.9 - 10.3 mg/dL Final  . Total Protein 12/30/2020 6.4* 6.5 - 8.1 g/dL Final  . Albumin 12/30/2020 3.4* 3.5 - 5.0 g/dL Final  . AST 12/30/2020 28  15 - 41 U/L Final  . ALT 12/30/2020 21  0 - 44 U/L Final  . Alkaline Phosphatase 12/30/2020 58  38 - 126 U/L Final  . Total Bilirubin 12/30/2020 0.5  0.3 - 1.2 mg/dL Final  . GFR, Estimated 12/30/2020 >60  >60 mL/min Final   Comment: (NOTE) Calculated using the CKD-EPI Creatinine Equation (2021)   . Anion gap 12/30/2020 13  5 - 15 Final   Performed at [facility], [address], [place], [place] [address]  . Magnesium 12/30/2020 1.6* 1.7 - 2.4 mg/dL Final   Performed at [facility], [address] [place], [place] [address]  . WBC 12/30/2020 7.2  4.0 - 10.5 K/uL Final  . RBC 12/30/2020 3.97  3.87 - 5.11 MIL/uL Final  . Hemoglobin 12/30/2020 12.4  12.0 - 15.0 g/dL Final  . HCT 12/30/2020 37.0  36.0 - 46.0 % Final  . MCV 12/30/2020 93.2  80.0 - 100.0 fL Final  . MCH 12/30/2020 31.2  26.0 - 34.0 pg Final  . MCHC  12/30/2020 33.5  30.0 - 36.0 g/dL Final  . RDW 12/30/2020 14.2  11.5 - 15.5 % Final  . Platelets 12/30/2020 289  150 - 400 K/uL Final  . nRBC 12/30/2020 0.0  0.0 - 0.2 % Final  . Neutrophils Relative % 12/30/2020 81  % Final  . Neutro Abs 12/30/2020 5.9  1.7 - 7.7 K/uL Final  . Lymphocytes Relative 12/30/2020 11  % Final  . Lymphs Abs 12/30/2020 0.8  0.7 - 4.0 K/uL Final  . Monocytes Relative 12/30/2020 6  % Final  . Monocytes Absolute 12/30/2020 0.4  0.1 - 1.0 K/uL Final  . Eosinophils Relative 12/30/2020 1  % Final  . Eosinophils Absolute 12/30/2020 0.1  0.0 - 0.5 K/uL Final  . Basophils Relative 12/30/2020 0  % Final  . Basophils Absolute 12/30/2020 0.0  0.0 - 0.1 K/uL Final  . Immature Granulocytes 12/30/2020 1  % Final  . Abs Immature Granulocytes 12/30/2020 0.04  0.00 - 0.07 K/uL Final   Performed at [facility], [address], [place], [place] [address]  . Color, Urine 12/30/2020  YELLOW  YELLOW Final  . APPearance 12/30/2020 CLEAR  CLEAR Final  . Specific Gravity, Urine 12/30/2020 >1.030* 1.005 - 1.030 Final  . pH 12/30/2020 5.5  5.0 - 8.0 Final  . Glucose, UA 12/30/2020 NEGATIVE  NEGATIVE mg/dL Final  . Hgb urine dipstick 12/30/2020 TRACE* NEGATIVE Final  . Bilirubin Urine 12/30/2020 NEGATIVE  NEGATIVE Final  . Ketones, ur 12/30/2020 NEGATIVE  NEGATIVE mg/dL Final  . Protein, ur 12/30/2020 100* NEGATIVE mg/dL Final  . Nitrite 12/30/2020 NEGATIVE  NEGATIVE Final  . Chalmers Guest 12/30/2020 NEGATIVE  NEGATIVE Final  . Squamous Epithelial / LPF 12/30/2020 0-5  0 - 5 Final  . WBC, UA 12/30/2020 NONE SEEN  0 - 5 WBC/hpf Final  . RBC / HPF 12/30/2020 6-10  0 - 5 RBC/hpf Final  . Bacteria, UA 12/30/2020 RARE* NONE SEEN Final   Performed at [facility], [address], [place], [place] [address]    Assessment:  AUBREEANA WAHID is a 65 y.o. female with systemic lupus erythematosis (SLE) and lupus nephritis.  She completed a course of prednisone.  She is  on Benlysta (began 04/2020).  Right kidney biopsy on 08/31/2020 revealed pauci immune focal necrotizing and early sclerosing glomerulonephritis with segments of tuft inflammation and rare focal tuft necrosis and less that 5% focal fibrocellular crescent formation (in one glomerulus). She is felt to have a lupus IV-S lesion.  ANCA, anti-GBM was normal.  She is s/p cycle #4 Cytoxan (11/04/2020 - 12/16/2020).  Protein creatinine ratio has been followed: 2.51 on 11/03/2020, 0.49 on 11/18/2020, 0.78 on 12/02/2020, and 1.47 on 12/16/2020.  She has inflammatory arthritis, adrenal insufficiency,and hypothyroidism secondary to Hashimoto's thyroiditis.  Symptomatically, he feels "good".  Exam is stable.  Plan: 1.   Labs today: CBC with diff, CMP, Mg, urinalysis, protein/creatine ratio. 2.   Lupus nephritis             Clinically, she is doing well.             Exam stable.  Urine protein 100 mg/dL.             She is s/p 4 cycles of Cytoxan (last 12/16/2020).             Continue 500 mg IV every 2 weeks x 6.             Abs reviewed.  Cycle #5 Cytoxan today. 3.   Hypokalemia  Potassium 3.4.  Continue foods rich in potassium. 4.   Cycle #5 Cytoxan today. 5.   RTC in 2 weeks for MD assessment, labs (CBC with diff, CMP, Mg, urinalysis, protein/creatine ratio), and cycle #6 Cytoxan.  I discussed the assessment and treatment plan with the patient.  The patient was provided an opportunity to ask questions and all were answered.  The patient agreed with the plan and demonstrated an understanding of the instructions.  The patient was advised to call back if the symptoms worsen or if the condition fails to improve as anticipated.    Casten Floren C. Mike Gip, MD, PhD    12/30/2020, 9:57 AM  I, Mirian Mo Tufford, am acting as Education administrator for [employer]. Mike Gip, MD, PhD.  I, Leylany Nored C. Mike Gip, MD, have reviewed the above documentation for accuracy and completeness, and I agree with the above.

## 2020-12-30 ENCOUNTER — Encounter: Payer: Self-pay | Admitting: Hematology and Oncology

## 2020-12-30 ENCOUNTER — Inpatient Hospital Stay: Payer: Medicare Other | Attending: Hematology and Oncology

## 2020-12-30 ENCOUNTER — Ambulatory Visit: Payer: Medicare Other

## 2020-12-30 ENCOUNTER — Inpatient Hospital Stay (HOSPITAL_BASED_OUTPATIENT_CLINIC_OR_DEPARTMENT_OTHER): Payer: Medicare Other | Admitting: Hematology and Oncology

## 2020-12-30 VITALS — BP 144/82 | HR 80 | Temp 98.9°F | Resp 16 | Wt 174.6 lb

## 2020-12-30 DIAGNOSIS — M3214 Glomerular disease in systemic lupus erythematosus: Secondary | ICD-10-CM | POA: Insufficient documentation

## 2020-12-30 DIAGNOSIS — Z5111 Encounter for antineoplastic chemotherapy: Secondary | ICD-10-CM

## 2020-12-30 DIAGNOSIS — E876 Hypokalemia: Secondary | ICD-10-CM | POA: Diagnosis not present

## 2020-12-30 DIAGNOSIS — M3219 Other organ or system involvement in systemic lupus erythematosus: Secondary | ICD-10-CM

## 2020-12-30 LAB — CBC WITH DIFFERENTIAL/PLATELET
Abs Immature Granulocytes: 0.04 10*3/uL (ref 0.00–0.07)
Basophils Absolute: 0 10*3/uL (ref 0.0–0.1)
Basophils Relative: 0 %
Eosinophils Absolute: 0.1 10*3/uL (ref 0.0–0.5)
Eosinophils Relative: 1 %
HCT: 37 % (ref 36.0–46.0)
Hemoglobin: 12.4 g/dL (ref 12.0–15.0)
Immature Granulocytes: 1 %
Lymphocytes Relative: 11 %
Lymphs Abs: 0.8 10*3/uL (ref 0.7–4.0)
MCH: 31.2 pg (ref 26.0–34.0)
MCHC: 33.5 g/dL (ref 30.0–36.0)
MCV: 93.2 fL (ref 80.0–100.0)
Monocytes Absolute: 0.4 10*3/uL (ref 0.1–1.0)
Monocytes Relative: 6 %
Neutro Abs: 5.9 10*3/uL (ref 1.7–7.7)
Neutrophils Relative %: 81 %
Platelets: 289 10*3/uL (ref 150–400)
RBC: 3.97 MIL/uL (ref 3.87–5.11)
RDW: 14.2 % (ref 11.5–15.5)
WBC: 7.2 10*3/uL (ref 4.0–10.5)
nRBC: 0 % (ref 0.0–0.2)

## 2020-12-30 LAB — URINALYSIS, COMPLETE (UACMP) WITH MICROSCOPIC
Bilirubin Urine: NEGATIVE
Glucose, UA: NEGATIVE mg/dL
Ketones, ur: NEGATIVE mg/dL
Leukocytes,Ua: NEGATIVE
Nitrite: NEGATIVE
Protein, ur: 100 mg/dL — AB
Specific Gravity, Urine: >1.03 — ABNORMAL HIGH (ref 1.005–1.030)
WBC, UA: NONE SEEN WBC/hpf (ref 0–5)
pH: 5.5 (ref 5.0–8.0)

## 2020-12-30 LAB — PROTEIN / CREATININE RATIO, URINE
Creatinine, Urine: 116 mg/dL
Protein Creatinine Ratio: 0.84 mg/mg{Cre} — ABNORMAL HIGH (ref 0.00–0.15)
Total Protein, Urine: 98 mg/dL

## 2020-12-30 LAB — MAGNESIUM: Magnesium: 1.6 mg/dL — ABNORMAL LOW (ref 1.7–2.4)

## 2020-12-30 MED ORDER — PALONOSETRON HCL INJECTION 0.25 MG/5ML
0.2500 mg | Freq: Once | INTRAVENOUS | Status: AC
  Administered 2020-12-30: 0.25 mg via INTRAVENOUS
  Filled 2020-12-30: qty 5

## 2020-12-30 MED ORDER — SODIUM CHLORIDE 0.9 % IV SOLN
10.0000 mg | Freq: Once | INTRAVENOUS | Status: AC
  Administered 2020-12-30: 10 mg via INTRAVENOUS
  Filled 2020-12-30: qty 1

## 2020-12-30 MED ORDER — SODIUM CHLORIDE 0.9 % IV SOLN
Freq: Once | INTRAVENOUS | Status: AC
  Filled 2020-12-30: qty 250

## 2020-12-30 MED ORDER — SODIUM CHLORIDE 0.9 % IV SOLN
500.0000 mg | Freq: Once | INTRAVENOUS | Status: AC
  Administered 2020-12-30: 500 mg via INTRAVENOUS
  Filled 2020-12-30: qty 25

## 2020-12-30 NOTE — Progress Notes (Signed)
Patient here for oncology follow-up appointment, expresses concerns of hip pain, nausea and cytoxan plan

## 2020-12-30 NOTE — Progress Notes (Signed)
Patient stable at discharge.

## 2020-12-31 ENCOUNTER — Telehealth: Payer: Self-pay | Admitting: *Deleted

## 2020-12-31 LAB — COMPREHENSIVE METABOLIC PANEL
ALT: 21 U/L (ref 0–44)
AST: 28 U/L (ref 15–41)
Albumin: 3.4 g/dL — ABNORMAL LOW (ref 3.5–5.0)
Alkaline Phosphatase: 58 U/L (ref 38–126)
Anion gap: 13 (ref 5–15)
BUN: 22 mg/dL (ref 8–23)
CO2: 21 mmol/L — ABNORMAL LOW (ref 22–32)
Calcium: 9.4 mg/dL (ref 8.9–10.3)
Chloride: 100 mmol/L (ref 98–111)
Creatinine, Ser: 0.89 mg/dL (ref 0.44–1.00)
GFR, Estimated: >60 mL/min (ref >60)
Glucose, Bld: 56 mg/dL — ABNORMAL LOW (ref 70–99)
Potassium: 3.4 mmol/L — ABNORMAL LOW (ref 3.5–5.1)
Sodium: 134 mmol/L — ABNORMAL LOW (ref 135–145)
Total Bilirubin: 0.5 mg/dL (ref 0.3–1.2)
Total Protein: 6.4 g/dL — ABNORMAL LOW (ref 6.5–8.1)

## 2020-12-31 NOTE — Telephone Encounter (Signed)
Pt identified by name and DOB. I asked if Dr Mike Gip mentioned her calcium level yesterday. Did she tell you to start or stop calcium?. Pt states calcium was not mentioned on her visit yesterday. I told pt there was an error in labs ran yesterday and all levels were re ran and came back normal.

## 2021-01-04 ENCOUNTER — Telehealth: Payer: Self-pay | Admitting: Adult Health

## 2021-01-04 MED ORDER — DESVENLAFAXINE SUCCINATE ER 50 MG PO TB24: 50.0000 mg | ORAL_TABLET | Freq: Every day | ORAL | 1 refills | Status: DC

## 2021-01-04 NOTE — Telephone Encounter (Signed)
Pt request refill desvenlafaxine (PRISTIQ) 50 MG 24 hr tablet at [facility]

## 2021-01-05 ENCOUNTER — Other Ambulatory Visit: Payer: Self-pay

## 2021-01-05 ENCOUNTER — Ambulatory Visit (HOSPITAL_BASED_OUTPATIENT_CLINIC_OR_DEPARTMENT_OTHER): Payer: Medicare Other | Attending: Orthopedic Surgery | Admitting: Physical Therapy

## 2021-01-05 DIAGNOSIS — M6281 Muscle weakness (generalized): Secondary | ICD-10-CM | POA: Diagnosis present

## 2021-01-05 DIAGNOSIS — M545 Low back pain, unspecified: Secondary | ICD-10-CM | POA: Diagnosis present

## 2021-01-05 DIAGNOSIS — R2681 Unsteadiness on feet: Secondary | ICD-10-CM | POA: Diagnosis present

## 2021-01-05 DIAGNOSIS — G8929 Other chronic pain: Secondary | ICD-10-CM | POA: Diagnosis present

## 2021-01-05 DIAGNOSIS — M25551 Pain in right hip: Secondary | ICD-10-CM | POA: Diagnosis not present

## 2021-01-05 NOTE — Patient Instructions (Signed)
Access Code: [email or web address] URL: [email or web address] Date: 01/05/2021 Prepared by: Estill Bamberg April Thurnell Garbe  Exercises Supine Hamstring Stretch with Strap - 1 x daily - 7 x weekly - 1 sets - 3 reps - 30 hold Supine Piriformis Stretch with Foot on Ground - 1 x daily - 7 x weekly - 3 sets - 30 sec hold Supine Bridge - 1 x daily - 7 x weekly - 2 sets - 10 reps - 3-5 sec hold Sit to Stand - 1 x daily - 7 x weekly - 3 sets - 5 reps Hip Hiking on Step - 1 x daily - 7 x weekly - 3 sets - 5 reps - 2-3 sec hold

## 2021-01-05 NOTE — Therapy (Signed)
[facility] [address] [place], [place], [address] Phone: [phone]   Fax:  [phone]  Physical Therapy Treatment  Patient Details  Name: Crystal Larsen MRN: [id number] Date of Birth: May 31, 1956 Referring Provider (PT): Rod Can, MD   Encounter Date: 01/05/2021   PT End of Session - 01/05/21 1532    Visit Number 2    Number of Visits 16    Date for PT Re-Evaluation 02/23/21    Authorization Type UHC Medicare    Progress Note Due on Visit 10    PT Start Time 1430    PT Stop Time 1515    PT Time Calculation (min) 45 min    Activity Tolerance Patient tolerated treatment well;Patient limited by fatigue;Patient limited by pain    Behavior During Therapy [facility] for tasks assessed/performed           Past Medical History:  Diagnosis Date  . Acute kidney injury ([place])   . Adrenal insufficiency ([place])   . Anemia   . Anxiety   . Arthritis    "left foot; left hand; right shoulder; back" (11/04/2014)  . Elevated liver enzymes   . Gallstones   . GERD (gastroesophageal reflux disease)   . History of gout   . HTN (hypertension)   . Hypothyroidism   . IBS (irritable bowel syndrome)    ?  [name] Lupus ([place]) 07/2019  . Metabolic acidosis   . Migraine    "maybe once/yr" (11/04/2014)  . Neuropathy    right leg  . Obesity   . Pancreatitis   . Pancreatitis   . PONV (postoperative nausea and vomiting) 1981 X 1   only time  . Seizures ([place]) 2003    due to tramadol reaction -- takes depakote for migraine prevention  . Sleep apnea    does not use cpap  . Spondylolisthesis of lumbar region   . Unsteady gait   . Wears glasses     Past Surgical History:  Procedure Laterality Date  . ANKLE GANGLION CYST EXCISION Right 1980's  . ANTERIOR LAT LUMBAR FUSION N/A 12/09/2019   Procedure: Lumbar One-Two Anterolateral lumbar decompression/interbody fusion with lateral plate;  Surgeon: Kristeen Miss, MD;  Location: [facility];  Service:  Neurosurgery;  Laterality: N/A;  Lumbar One-Two Anterolateral lumbar decompression/interbody fusion with lateral plate  . BACK SURGERY    . BREAST SURGERY     biopsy  . BUNIONECTOMY Left 2012  . CARPAL TUNNEL RELEASE Right 05/22/2019   Procedure: RIGHT CARPAL TUNNEL RELEASE;  Surgeon: Daryll Brod, MD;  Location: [facility];  Service: Orthopedics;  Laterality: Right;  . CARPOMETACARPEL SUSPENSION PLASTY Right 05/22/2019   Procedure: ARTHROPLASY CARPOMETACARPAL RIGHT THUMB TRAPEZIUM EXCISION TENDON TRANSFER MICRO LINK SUSPENSION;  Surgeon: Daryll Brod, MD;  Location: [facility];  Service: Orthopedics;  Laterality: Right;  . CESAREAN SECTION  1986  . COLONOSCOPY W/ BIOPSIES AND POLYPECTOMY    . EYE SURGERY Bilateral 2012   "lasered a hole in my iris cause pressure was building up"  . LAPAROSCOPIC CHOLECYSTECTOMY  1990's  . NASAL SINUS SURGERY  1980's  . POSTERIOR LUMBAR FUSION  05/2013   L4-5; S1  . POSTERIOR LUMBAR FUSION  11/04/2014   L3-4  . SHOULDER ARTHROSCOPY W/ ROTATOR CUFF REPAIR Right 2012  . TUBAL LIGATION  1992    There were no vitals filed for this visit.   Subjective Assessment - 01/05/21 1430    Subjective Pt states she has been able  to do the exercises at least once a day. Pt reports that she has not been able to do the same amount of stretches as she was able to before when she got therapy for her back.    Pertinent History Lupus, lumbar fusion L2-S1 (4 surgeries)    Limitations Sitting;Walking;Standing    How long can you sit comfortably? recliner helps, if sitting straight 5 min    How long can you stand comfortably? back gets tired, 20 min    How long can you walk comfortably? it's always painful    Diagnostic tests lumbar MRI 2021: mild retrolisthesis L1/2 with some foraminal narrowing    Patient Stated Goals improve pain, will be new grandparents in April and hopes to be involved    Currently in Pain? Yes    Pain Score 6    6/10 when  walking, 1/10 while supine   Pain Location Hip    Pain Orientation Right    Pain Descriptors / Indicators Aching    Pain Type Chronic pain    Pain Onset More than a month ago    Aggravating Factors  Walking    Pain Relieving Factors Recliner/off loading                             [facility] Adult PT Treatment/Exercise - 01/05/21 0001      Ambulation/Gait   Ambulation Distance (Feet) 200 Feet    Assistive device Straight cane    Gait Pattern Step-through pattern;Trendelenburg;Decreased weight shift to right;Lateral hip instability    Gait Comments cues to decrease lateral trunk flexion -- able to correct with residual L hip drop during L stance phase      Exercises   Exercises Knee/Hip      Knee/Hip Exercises: Stretches   Passive Hamstring Stretch Right;30 seconds;1 rep    ITB Stretch Right;30 seconds    Piriformis Stretch Right;30 seconds   Supine     Knee/Hip Exercises: Aerobic   Nustep 5 min lvl 4      Knee/Hip Exercises: Machines for Strengthening   Other Machine Shuttle: R single leg press 25# 2x10; bilat single leg donkey kick x10 bilat 12#      Knee/Hip Exercises: Standing   Other Standing Knee Exercises Hip hike x10 bilat   limited due to fatigue     Knee/Hip Exercises: Seated   Other Seated Knee/Hip Exercises --      Knee/Hip Exercises: Supine   Bridges Strengthening;Both;2 sets;10 reps    Other Supine Knee/Hip Exercises Posterior pelvic tilt x10                  PT Education - 01/05/21 1525    Education Details Updates to HEP, gait mechanics    Person(s) Educated Patient    Methods Explanation;Handout    Comprehension Verbalized understanding;Tactile cues required;Returned demonstration            PT Short Term Goals - 12/29/20 1257      PT SHORT TERM GOAL #1   Title Pt will be ind with initial HEP    Time 3    Period Weeks    Status New    Target Date 01/19/21      PT SHORT TERM GOAL #2   Title Pt will demo improved gait  pattern to include improved weight shifting into Rt LE and reduce degree of Trendelenburg bil secondary to improved gluteal use in closed chain.  Time 4    Period Weeks    Status New    Target Date 01/26/21      PT SHORT TERM GOAL #3   Title Pt will report reduced pain with household ambulation by at least 30%    Time 4    Period Weeks    Status New    Target Date 01/26/21      PT SHORT TERM GOAL #4   Title Pt will be able to sit in upright chair (vs recliner) for at least 15 mintues for mealtime with at least 30% less pain.      PT SHORT TERM GOAL #5   Title Pt will demo 5x sit to stand without UE use in </= 13 sec    Baseline 15 sec, use of UEs    Time 4    Period Weeks    Status New    Target Date 01/26/21             PT Long Term Goals - 12/29/20 1303      PT LONG TERM GOAL #1   Title Pt will report improved endurance for light ADLs and household ambulation for up to 20 min before needing seated break.    Time 8    Period Weeks    Status New    Target Date 02/23/21      PT LONG TERM GOAL #2   Title Patient able to safely amb community distances using cane.    Time 8    Period Weeks    Status New    Target Date 02/23/21      PT LONG TERM GOAL #3   Title Improved Bil hip strength to >= 4+/5 to improve overall function    Baseline bil hips 4/5    Time 8    Period Weeks    Status New    Target Date 02/23/21      PT LONG TERM GOAL #4   Title Improved 5x sit to stand to </= 11 sec to reduce fall risk    Baseline 15 sec    Time 8    Period Weeks    Status New    Target Date 02/23/21      PT LONG TERM GOAL #5   Title Improved FOTO to >/= 50% to demo improved function    Baseline 33%    Time 8    Period Weeks    Status New    Target Date 02/23/21                 Plan - 01/05/21 1526    Clinical Impression Statement Treatment session focused on reviewing HEP and modifying/updating it accordingly. Pt presents with R>L hip weakness. Provided pt  with hip strengthening exercises and gait training to reduce her gait deviations. Pt able to amb with reduced trendelenburg by end of session with visual and tactile cueing.    Personal Factors and Comorbidities Comorbidity 1;Comorbidity 2;Time since onset of injury/illness/exacerbation    Comorbidities lumbar fusion L2-S1, Lupus    Examination-Activity Limitations Locomotion Level;Sit;Lift;Stairs;Stand;Bend    Examination-Participation Restrictions Meal Prep;Laundry;Shop;Community Activity;Cleaning    Stability/Clinical Decision Making Stable/Uncomplicated    Rehab Potential Good    PT Frequency 2x / week    PT Duration 8 weeks    PT Treatment/Interventions ADLs/Self Care Home Management;Cryotherapy;Electrical Stimulation;Moist Heat;Iontophoresis 4mg /ml Dexamethasone;Gait training;Stair training;Functional mobility training;Therapeutic exercise;Therapeutic activities;Neuromuscular re-education;Balance training;Patient/family education;Energy conservation;Passive range of motion;Manual techniques;Taping;Spinal Manipulations;Joint Manipulations    PT Next Visit  Plan NuStep, review HEP, gait activities for closed chain weight shifting, core and hip strength as tol (consider hip abduction sidelying or standing, upright skating, abductor machines, etc)    PT Home Exercise Plan Access Code: [email or web address]    Consulted and Agree with Plan of Care Patient           Patient will benefit from skilled therapeutic intervention in order to improve the following deficits and impairments:  Abnormal gait,Decreased range of motion,Difficulty walking,Decreased endurance,Pain,Decreased activity tolerance,Impaired flexibility,Decreased balance,Decreased mobility,Decreased strength  Visit Diagnosis: Pain in right hip  Chronic bilateral low back pain, unspecified whether sciatica present  Muscle weakness (generalized)  Unsteadiness on feet     Problem List Patient Active Problem List   Diagnosis Date Noted   . Encounter for antineoplastic chemotherapy 11/18/2020  . Systemic lupus erythematosus ([place]) 11/03/2020  . Lupus nephritis ([place]) 11/03/2020  . Proteinuria 11/03/2020  . Other spondylosis with radiculopathy, lumbar region 12/09/2019  . Secondary adrenal insufficiency ([place]) 04/03/2019  . Metabolic acidosis, normal anion gap (NAG) 04/02/2019  . HTN (hypertension)   . Adrenal insufficiency ([place]) 04/01/2019  . Hyponatremia 04/01/2019  . Hypothyroidism 04/01/2019  . AKI (acute kidney injury) ([place]) 04/01/2019  . Lumbar stenosis with neurogenic claudication 02/19/2017  . Migraine with aura and without status migrainosus, not intractable 07/11/2016  . Spondylolisthesis of lumbar region 11/04/2014  . Postconcussional syndrome 09/30/2013  . Post-concussion headache 09/30/2013  . Esophageal reflux 10/11/2011  . Benign neoplasm of colon 10/11/2011  . Gastritis, chronic 10/11/2011  . Guaiac + stool 09/28/2011  . IBS (irritable bowel syndrome) 09/28/2011  . GERD (gastroesophageal reflux disease) 09/28/2011  . Migraine syndrome 09/28/2011    [facility] April Gordy Levan PT, DPT 01/05/2021, 3:39 PM  [facility] [place], [place], [address] Phone: [phone]   Fax:  [phone]  Name: Crystal Larsen MRN: [id number] Date of Birth: 07/09/1956

## 2021-01-07 ENCOUNTER — Ambulatory Visit (HOSPITAL_BASED_OUTPATIENT_CLINIC_OR_DEPARTMENT_OTHER): Payer: Medicare Other | Attending: Orthopedic Surgery | Admitting: Physical Therapy

## 2021-01-07 ENCOUNTER — Other Ambulatory Visit: Payer: Self-pay

## 2021-01-07 DIAGNOSIS — M25551 Pain in right hip: Secondary | ICD-10-CM | POA: Diagnosis present

## 2021-01-07 DIAGNOSIS — R2681 Unsteadiness on feet: Secondary | ICD-10-CM | POA: Insufficient documentation

## 2021-01-07 DIAGNOSIS — M6281 Muscle weakness (generalized): Secondary | ICD-10-CM | POA: Insufficient documentation

## 2021-01-07 DIAGNOSIS — G8929 Other chronic pain: Secondary | ICD-10-CM | POA: Diagnosis present

## 2021-01-07 DIAGNOSIS — M545 Low back pain, unspecified: Secondary | ICD-10-CM | POA: Insufficient documentation

## 2021-01-07 NOTE — Therapy (Signed)
[facility] [place], [place], [address] Phone: [phone]   Fax:  [phone]  Physical Therapy Treatment  Patient Details  Name: Crystal Larsen MRN: [id number] Date of Birth: Jan 08, 1956 Referring Provider (PT): Rod Can, MD   Encounter Date: 01/07/2021   PT End of Session - 01/07/21 1253    Visit Number 3    Number of Visits 16    Date for PT Re-Evaluation 02/23/21    Authorization Type UHC Medicare    Progress Note Due on Visit 10    PT Start Time 1147    PT Stop Time 1232    PT Time Calculation (min) 45 min    Activity Tolerance Patient tolerated treatment well;Patient limited by fatigue;Patient limited by pain    Behavior During Therapy [facility] for tasks assessed/performed           Past Medical History:  Diagnosis Date  . Acute kidney injury ([place])   . Adrenal insufficiency ([place])   . Anemia   . Anxiety   . Arthritis    "left foot; left hand; right shoulder; back" (11/04/2014)  . Elevated liver enzymes   . Gallstones   . GERD (gastroesophageal reflux disease)   . History of gout   . HTN (hypertension)   . Hypothyroidism   . IBS (irritable bowel syndrome)    ?  [name] Lupus ([place]) 07/2019  . Metabolic acidosis   . Migraine    "maybe once/yr" (11/04/2014)  . Neuropathy    right leg  . Obesity   . Pancreatitis   . Pancreatitis   . PONV (postoperative nausea and vomiting) 1981 X 1   only time  . Seizures ([place]) 2003    due to tramadol reaction -- takes depakote for migraine prevention  . Sleep apnea    does not use cpap  . Spondylolisthesis of lumbar region   . Unsteady gait   . Wears glasses     Past Surgical History:  Procedure Laterality Date  . ANKLE GANGLION CYST EXCISION Right 1980's  . ANTERIOR LAT LUMBAR FUSION N/A 12/09/2019   Procedure: Lumbar One-Two Anterolateral lumbar decompression/interbody fusion with lateral plate;  Surgeon: Kristeen Miss, MD;  Location: [facility];   Service: Neurosurgery;  Laterality: N/A;  Lumbar One-Two Anterolateral lumbar decompression/interbody fusion with lateral plate  . BACK SURGERY    . BREAST SURGERY     biopsy  . BUNIONECTOMY Left 2012  . CARPAL TUNNEL RELEASE Right 05/22/2019   Procedure: RIGHT CARPAL TUNNEL RELEASE;  Surgeon: Daryll Brod, MD;  Location: [facility];  Service: Orthopedics;  Laterality: Right;  . CARPOMETACARPEL SUSPENSION PLASTY Right 05/22/2019   Procedure: ARTHROPLASY CARPOMETACARPAL RIGHT THUMB TRAPEZIUM EXCISION TENDON TRANSFER MICRO LINK SUSPENSION;  Surgeon: Daryll Brod, MD;  Location: [facility];  Service: Orthopedics;  Laterality: Right;  . CESAREAN SECTION  1986  . COLONOSCOPY W/ BIOPSIES AND POLYPECTOMY    . EYE SURGERY Bilateral 2012   "lasered a hole in my iris cause pressure was building up"  . LAPAROSCOPIC CHOLECYSTECTOMY  1990's  . NASAL SINUS SURGERY  1980's  . POSTERIOR LUMBAR FUSION  05/2013   L4-5; S1  . POSTERIOR LUMBAR FUSION  11/04/2014   L3-4  . SHOULDER ARTHROSCOPY W/ ROTATOR CUFF REPAIR Right 2012  . TUBAL LIGATION  1992    There were no vitals filed for this visit.   Subjective Assessment - 01/07/21 1151    Subjective Pt reports increased soreness and discomfort.  3/10 soreness, pt with 5/10 pain this morning and with movement improved. Pt states she was with her husband yesterday for his hernia repair surgery.    Pertinent History Lupus, lumbar fusion L2-S1 (4 surgeries)    Limitations Sitting;Walking;Standing    How long can you sit comfortably? recliner helps, if sitting straight 5 min    How long can you stand comfortably? back gets tired, 20 min    How long can you walk comfortably? it's always painful    Diagnostic tests lumbar MRI 2021: mild retrolisthesis L1/2 with some foraminal narrowing    Patient Stated Goals improve pain, will be new grandparents in April and hopes to be involved    Currently in Pain? Yes    Pain Score 4     Pain  Location Hip    Pain Orientation Right    Pain Descriptors / Indicators Aching    Pain Type Chronic pain    Pain Onset More than a month ago                             [facility] Adult PT Treatment/Exercise - 01/07/21 0001      Knee/Hip Exercises: Stretches   Piriformis Stretch Right;30 seconds;Left    Other Knee/Hip Stretches L-stretch x30 sec forward & bilat side      Knee/Hip Exercises: Aerobic   Nustep 5 min lvl 5      Knee/Hip Exercises: Machines for Strengthening   Other Machine Shuttle 50#: double leg x15 with lime tband+clamshell x10; R single leg x10; plantarflexion 2x10;      Knee/Hip Exercises: Standing   Hip Abduction Stengthening;Both;10 reps;Knee straight    Abduction Limitations Sliding foot on wash rag    Other Standing Knee Exercises Hip hike x5 bilat limited due to soreness    Other Standing Knee Exercises standing side plank 2x20 sec                    PT Short Term Goals - 12/29/20 1257      PT SHORT TERM GOAL #1   Title Pt will be ind with initial HEP    Time 3    Period Weeks    Status New    Target Date 01/19/21      PT SHORT TERM GOAL #2   Title Pt will demo improved gait pattern to include improved weight shifting into Rt LE and reduce degree of Trendelenburg bil secondary to improved gluteal use in closed chain.    Time 4    Period Weeks    Status New    Target Date 01/26/21      PT SHORT TERM GOAL #3   Title Pt will report reduced pain with household ambulation by at least 30%    Time 4    Period Weeks    Status New    Target Date 01/26/21      PT SHORT TERM GOAL #4   Title Pt will be able to sit in upright chair (vs recliner) for at least 15 mintues for mealtime with at least 30% less pain.      PT SHORT TERM GOAL #5   Title Pt will demo 5x sit to stand without UE use in </= 13 sec    Baseline 15 sec, use of UEs    Time 4    Period Weeks    Status New    Target Date 01/26/21  PT Long Term  Goals - 12/29/20 1303      PT LONG TERM GOAL #1   Title Pt will report improved endurance for light ADLs and household ambulation for up to 20 min before needing seated break.    Time 8    Period Weeks    Status New    Target Date 02/23/21      PT LONG TERM GOAL #2   Title Patient able to safely amb community distances using cane.    Time 8    Period Weeks    Status New    Target Date 02/23/21      PT LONG TERM GOAL #3   Title Improved Bil hip strength to >= 4+/5 to improve overall function    Baseline bil hips 4/5    Time 8    Period Weeks    Status New    Target Date 02/23/21      PT LONG TERM GOAL #4   Title Improved 5x sit to stand to </= 11 sec to reduce fall risk    Baseline 15 sec    Time 8    Period Weeks    Status New    Target Date 02/23/21      PT LONG TERM GOAL #5   Title Improved FOTO to >/= 50% to demo improved function    Baseline 33%    Time 8    Period Weeks    Status New    Target Date 02/23/21                 Plan - 01/07/21 1238    Clinical Impression Statement Pt with increased soreness today. Treatment focused on providing new stretches as indicated to address soreness. Increased and modified exercises for hip abductor and core strengthening. Pt has attempted to be more consistent with improving her gait quality. Pt with difficulty performing standing hip abduction without compensation (i.e. hips/pelvis rotates)    Personal Factors and Comorbidities Comorbidity 1;Comorbidity 2;Time since onset of injury/illness/exacerbation    Comorbidities lumbar fusion L2-S1, Lupus    Examination-Activity Limitations Locomotion Level;Sit;Lift;Stairs;Stand;Bend    Examination-Participation Restrictions Meal Prep;Laundry;Shop;Community Activity;Cleaning    Stability/Clinical Decision Making Stable/Uncomplicated    Clinical Decision Making Moderate    Rehab Potential Good    PT Frequency 2x / week    PT Duration 8 weeks    PT Treatment/Interventions  ADLs/Self Care Home Management;Cryotherapy;Electrical Stimulation;Moist Heat;Iontophoresis 4mg /ml Dexamethasone;Gait training;Stair training;Functional mobility training;Therapeutic exercise;Therapeutic activities;Neuromuscular re-education;Balance training;Patient/family education;Energy conservation;Passive range of motion;Manual techniques;Taping;Spinal Manipulations;Joint Manipulations    PT Next Visit Plan NuStep, review HEP, gait activities for closed chain weight shifting, core and hip strength as tol (consider hip abduction sidelying or standing, upright skating, abductor machines, etc)    PT Home Exercise Plan Access Code: [email or web address]    Consulted and Agree with Plan of Care Patient           Patient will benefit from skilled therapeutic intervention in order to improve the following deficits and impairments:  Abnormal gait,Decreased range of motion,Difficulty walking,Decreased endurance,Pain,Decreased activity tolerance,Impaired flexibility,Decreased balance,Decreased mobility,Decreased strength  Visit Diagnosis: Pain in right hip  Chronic bilateral low back pain, unspecified whether sciatica present  Muscle weakness (generalized)  Unsteadiness on feet     Problem List Patient Active Problem List   Diagnosis Date Noted  . Encounter for antineoplastic chemotherapy 11/18/2020  . Systemic lupus erythematosus ([place]) 11/03/2020  . Lupus nephritis ([place]) 11/03/2020  . Proteinuria 11/03/2020  . Other spondylosis with  radiculopathy, lumbar region 12/09/2019  . Secondary adrenal insufficiency ([place]) 04/03/2019  . Metabolic acidosis, normal anion gap (NAG) 04/02/2019  . HTN (hypertension)   . Adrenal insufficiency ([place]) 04/01/2019  . Hyponatremia 04/01/2019  . Hypothyroidism 04/01/2019  . AKI (acute kidney injury) ([place]) 04/01/2019  . Lumbar stenosis with neurogenic claudication 02/19/2017  . Migraine with aura and without status migrainosus, not intractable 07/11/2016  .  Spondylolisthesis of lumbar region 11/04/2014  . Postconcussional syndrome 09/30/2013  . Post-concussion headache 09/30/2013  . Esophageal reflux 10/11/2011  . Benign neoplasm of colon 10/11/2011  . Gastritis, chronic 10/11/2011  . Guaiac + stool 09/28/2011  . IBS (irritable bowel syndrome) 09/28/2011  . GERD (gastroesophageal reflux disease) 09/28/2011  . Migraine syndrome 09/28/2011    [facility] April Ma L Adetokunbo Mccadden PT, DPT 01/07/2021, 12:56 PM  [facility] [address] [place], [place], [address] Phone: [phone]   Fax:  [phone]  Name: Crystal Larsen MRN: [id number] Date of Birth: 1956/03/16

## 2021-01-11 ENCOUNTER — Ambulatory Visit (HOSPITAL_BASED_OUTPATIENT_CLINIC_OR_DEPARTMENT_OTHER): Payer: Medicare Other | Attending: Orthopedic Surgery | Admitting: Physical Therapy

## 2021-01-11 ENCOUNTER — Other Ambulatory Visit: Payer: Self-pay

## 2021-01-11 DIAGNOSIS — R2681 Unsteadiness on feet: Secondary | ICD-10-CM

## 2021-01-11 DIAGNOSIS — M545 Low back pain, unspecified: Secondary | ICD-10-CM

## 2021-01-11 DIAGNOSIS — G8929 Other chronic pain: Secondary | ICD-10-CM

## 2021-01-11 DIAGNOSIS — M7061 Trochanteric bursitis, right hip: Secondary | ICD-10-CM | POA: Diagnosis not present

## 2021-01-11 DIAGNOSIS — M25551 Pain in right hip: Secondary | ICD-10-CM

## 2021-01-11 DIAGNOSIS — M6281 Muscle weakness (generalized): Secondary | ICD-10-CM

## 2021-01-11 NOTE — Therapy (Signed)
[facility] [address] [place], [place], [address] Phone: [phone]   Fax:  [phone]  Physical Therapy Treatment  Patient Details  Name: Crystal Larsen MRN: [id number] Date of Birth: November 08, 1956 Referring Provider (PT): Rod Can, MD   Encounter Date: 01/11/2021   PT End of Session - 01/11/21 1442    Visit Number 4    Number of Visits 16    Date for PT Re-Evaluation 02/23/21    Authorization Type [employer] Medicare    Progress Note Due on Visit 10    PT Start Time 1346    PT Stop Time 1433    PT Time Calculation (min) 47 min    Activity Tolerance Patient tolerated treatment well;Patient limited by fatigue;Patient limited by pain    Behavior During Therapy [facility] for tasks assessed/performed           Past Medical History:  Diagnosis Date  . Acute kidney injury ([place])   . Adrenal insufficiency ([place])   . Anemia   . Anxiety   . Arthritis    "left foot; left hand; right shoulder; back" (11/04/2014)  . Elevated liver enzymes   . Gallstones   . GERD (gastroesophageal reflux disease)   . History of gout   . HTN (hypertension)   . Hypothyroidism   . IBS (irritable bowel syndrome)    ?  [name] Lupus ([place]) 07/2019  . Metabolic acidosis   . Migraine    "maybe once/yr" (11/04/2014)  . Neuropathy    right leg  . Obesity   . Pancreatitis   . Pancreatitis   . PONV (postoperative nausea and vomiting) 1981 X 1   only time  . Seizures ([place]) 2003    due to tramadol reaction -- takes depakote for migraine prevention  . Sleep apnea    does not use cpap  . Spondylolisthesis of lumbar region   . Unsteady gait   . Wears glasses     Past Surgical History:  Procedure Laterality Date  . ANKLE GANGLION CYST EXCISION Right 1980's  . ANTERIOR LAT LUMBAR FUSION N/A 12/09/2019   Procedure: Lumbar One-Two Anterolateral lumbar decompression/interbody fusion with lateral plate;  Surgeon: Kristeen Miss, MD;  Location: [facility];   Service: Neurosurgery;  Laterality: N/A;  Lumbar One-Two Anterolateral lumbar decompression/interbody fusion with lateral plate  . BACK SURGERY    . BREAST SURGERY     biopsy  . BUNIONECTOMY Left 2012  . CARPAL TUNNEL RELEASE Right 05/22/2019   Procedure: RIGHT CARPAL TUNNEL RELEASE;  Surgeon: Daryll Brod, MD;  Location: [facility];  Service: Orthopedics;  Laterality: Right;  . CARPOMETACARPEL SUSPENSION PLASTY Right 05/22/2019   Procedure: ARTHROPLASY CARPOMETACARPAL RIGHT THUMB TRAPEZIUM EXCISION TENDON TRANSFER MICRO LINK SUSPENSION;  Surgeon: Daryll Brod, MD;  Location: [facility];  Service: Orthopedics;  Laterality: Right;  . CESAREAN SECTION  1986  . COLONOSCOPY W/ BIOPSIES AND POLYPECTOMY    . EYE SURGERY Bilateral 2012   "lasered a hole in my iris cause pressure was building up"  . LAPAROSCOPIC CHOLECYSTECTOMY  1990's  . NASAL SINUS SURGERY  1980's  . POSTERIOR LUMBAR FUSION  05/2013   L4-5; S1  . POSTERIOR LUMBAR FUSION  11/04/2014   L3-4  . SHOULDER ARTHROSCOPY W/ ROTATOR CUFF REPAIR Right 2012  . TUBAL LIGATION  1992    There were no vitals filed for this visit.   Subjective Assessment - 01/11/21 1347    Subjective Pt reports nothing new or different.  Pt states she got an effusion for the lupus. Pt is not too sore.    Pertinent History Lupus, lumbar fusion L2-S1 (4 surgeries)    Limitations Sitting;Walking;Standing    How long can you sit comfortably? recliner helps, if sitting straight 5 min    How long can you stand comfortably? back gets tired, 20 min    How long can you walk comfortably? it's always painful    Diagnostic tests lumbar MRI 2021: mild retrolisthesis L1/2 with some foraminal narrowing    Patient Stated Goals improve pain, will be new grandparents in April and hopes to be involved    Currently in Pain? Yes    Pain Score 1     Pain Location Hip    Pain Orientation Right    Pain Descriptors / Indicators Aching    Pain Onset  More than a month ago                             [facility] Adult PT Treatment/Exercise - 01/11/21 0001      Knee/Hip Exercises: Aerobic   Nustep 6 min lvl 5      Knee/Hip Exercises: Machines for Strengthening   Total Gym Leg Press 45# double leg 2x8 reps    Hip Cybex Hip abductor 30# 2x10 reps      Knee/Hip Exercises: Standing   Hip Abduction Stengthening;Both;Knee straight;2 sets;5 reps    Abduction Limitations Sliding foot on wash rag with tangerine tband    Lateral Step Up Both;10 reps    Other Standing Knee Exercises Hip hike x10 bilat limited due to soreness; feet together on foam with head turns x10    Other Standing Knee Exercises side plank on elevated plinth x20 sec; front plank on plint 2x20 sec      Knee/Hip Exercises: Supine   Bridges Strengthening;Both;10 reps;1 set    Constance Haw with Clamshell Strengthening;Both;1 set;10 reps                  PT Education - 01/11/21 1438    Education Details Updated and modified HEP    Person(s) Educated Patient    Methods Explanation;Handout    Comprehension Verbalized understanding;Tactile cues required            PT Short Term Goals - 12/29/20 1257      PT SHORT TERM GOAL #1   Title Pt will be ind with initial HEP    Time 3    Period Weeks    Status New    Target Date 01/19/21      PT SHORT TERM GOAL #2   Title Pt will demo improved gait pattern to include improved weight shifting into Rt LE and reduce degree of Trendelenburg bil secondary to improved gluteal use in closed chain.    Time 4    Period Weeks    Status New    Target Date 01/26/21      PT SHORT TERM GOAL #3   Title Pt will report reduced pain with household ambulation by at least 30%    Time 4    Period Weeks    Status New    Target Date 01/26/21      PT SHORT TERM GOAL #4   Title Pt will be able to sit in upright chair (vs recliner) for at least 15 mintues for mealtime with at least 30% less pain.      PT SHORT TERM GOAL  #5  Title Pt will demo 5x sit to stand without UE use in </= 13 sec    Baseline 15 sec, use of UEs    Time 4    Period Weeks    Status New    Target Date 01/26/21             PT Long Term Goals - 12/29/20 1303      PT LONG TERM GOAL #1   Title Pt will report improved endurance for light ADLs and household ambulation for up to 20 min before needing seated break.    Time 8    Period Weeks    Status New    Target Date 02/23/21      PT LONG TERM GOAL #2   Title Patient able to safely amb community distances using cane.    Time 8    Period Weeks    Status New    Target Date 02/23/21      PT LONG TERM GOAL #3   Title Improved Bil hip strength to >= 4+/5 to improve overall function    Baseline bil hips 4/5    Time 8    Period Weeks    Status New    Target Date 02/23/21      PT LONG TERM GOAL #4   Title Improved 5x sit to stand to </= 11 sec to reduce fall risk    Baseline 15 sec    Time 8    Period Weeks    Status New    Target Date 02/23/21      PT LONG TERM GOAL #5   Title Improved FOTO to >/= 50% to demo improved function    Baseline 33%    Time 8    Period Weeks    Status New    Target Date 02/23/21                 Plan - 01/11/21 1438    Clinical Impression Statement Treatment focused on continuing to strengthen core, glutes, and hip abductors. Pt with decreased pelvic compensation when performing hip abduction with resistance. Pt currently tolerating 45 lb for 8 rep max on leg press machine. Attempted to progress side plank; however, pt with increased pressure in back. Initiated hip stability/proprioception with balance training on foam.    Personal Factors and Comorbidities Comorbidity 1;Comorbidity 2;Time since onset of injury/illness/exacerbation    Comorbidities lumbar fusion L2-S1, Lupus    Examination-Activity Limitations Locomotion Level;Sit;Lift;Stairs;Stand;Bend    Examination-Participation Restrictions Meal Prep;Laundry;Shop;Community  Activity;Cleaning    Stability/Clinical Decision Making Stable/Uncomplicated    Rehab Potential Good    PT Frequency 2x / week    PT Duration 8 weeks    PT Treatment/Interventions ADLs/Self Care Home Management;Cryotherapy;Electrical Stimulation;Moist Heat;Iontophoresis 4mg /ml Dexamethasone;Gait training;Stair training;Functional mobility training;Therapeutic exercise;Therapeutic activities;Neuromuscular re-education;Balance training;Patient/family education;Energy conservation;Passive range of motion;Manual techniques;Taping;Spinal Manipulations;Joint Manipulations    PT Next Visit Plan NuStep, review HEP, gait activities for closed chain weight shifting, core and hip strength as tol (continue standing hip abduction/upright skating, abductor machines, leg press)    PT Home Exercise Plan Access Code: [email or web address]    Consulted and Agree with Plan of Care Patient           Patient will benefit from skilled therapeutic intervention in order to improve the following deficits and impairments:  Abnormal gait,Decreased range of motion,Difficulty walking,Decreased endurance,Pain,Decreased activity tolerance,Impaired flexibility,Decreased balance,Decreased mobility,Decreased strength  Visit Diagnosis: Pain in right hip  Chronic bilateral low back pain, unspecified whether sciatica present  Muscle weakness (generalized)  Unsteadiness on feet     Problem List Patient Active Problem List   Diagnosis Date Noted  . Encounter for antineoplastic chemotherapy 11/18/2020  . Systemic lupus erythematosus ([place]) 11/03/2020  . Lupus nephritis ([place]) 11/03/2020  . Proteinuria 11/03/2020  . Other spondylosis with radiculopathy, lumbar region 12/09/2019  . Secondary adrenal insufficiency ([place]) 04/03/2019  . Metabolic acidosis, normal anion gap (NAG) 04/02/2019  . HTN (hypertension)   . Adrenal insufficiency ([place]) 04/01/2019  . Hyponatremia 04/01/2019  . Hypothyroidism 04/01/2019  . AKI (acute kidney  injury) ([place]) 04/01/2019  . Lumbar stenosis with neurogenic claudication 02/19/2017  . Migraine with aura and without status migrainosus, not intractable 07/11/2016  . Spondylolisthesis of lumbar region 11/04/2014  . Postconcussional syndrome 09/30/2013  . Post-concussion headache 09/30/2013  . Esophageal reflux 10/11/2011  . Benign neoplasm of colon 10/11/2011  . Gastritis, chronic 10/11/2011  . Guaiac + stool 09/28/2011  . IBS (irritable bowel syndrome) 09/28/2011  . GERD (gastroesophageal reflux disease) 09/28/2011  . Migraine syndrome 09/28/2011    [facility] [address] PT, DPT 01/11/2021, 2:43 PM  [facility] [address] [place], [place], [address] Phone: [phone]   Fax:  [phone]  Name: MIRENDA BALTAZAR MRN: [id number] Date of Birth: 10-22-1956

## 2021-01-12 DIAGNOSIS — E876 Hypokalemia: Secondary | ICD-10-CM | POA: Insufficient documentation

## 2021-01-12 NOTE — Progress Notes (Signed)
Patient contacted for assessment for tomorrow's visit. Patient has no questions or concerns at this time.

## 2021-01-12 NOTE — Progress Notes (Signed)
[facility]  [address] [place], [place] [address] Phone: [phone]  Fax: [phone]   Clinic Day:  01/13/2021  Referring physician: Jonathon Jordan, MD  Chief Complaint: Crystal Larsen is a 65 y.o. female with lupus nephritis and stage III chronic kidney disease who is seen for assessment prior to cycle #6 Cytoxan.  HPI: The patient was last seen in the hematology clinic on 12/30/2020. At that time, she felt "good".  She described transient fatigue after treatment.  Reflux had improved. Hematocrit was 37.0, hemoglobin 12.4, platelets 289,000, WBC 7,200. Sodium was 134. Potassium was 3.4.  Albumen was 3.4.  Magnesium was 1.6. Protein creatinine ratio was 0.84. UA showed a protein 100 mg/dL. She received cycle #5 Cytoxan.  During the interim, she has been "good." She felt a little bit fatigued the Sunday after treatment, but that was her only symptom. Her reflux is stable on medication. She still has a lot of pain in her hands. Her hip pain is bad; she has started physical therapy. Her balance is off due to neuropathy in her feet. She feels that the neuropathy is creeping up her left leg. She is losing some hair.  She saw her nephrologist, Dr. Candiss Norse, earlier this week.   Past Medical History:  Diagnosis Date  . Acute kidney injury ([place])   . Adrenal insufficiency ([place])   . Anemia   . Anxiety   . Arthritis    "left foot; left hand; right shoulder; back" (11/04/2014)  . Elevated liver enzymes   . Gallstones   . GERD (gastroesophageal reflux disease)   . History of gout   . HTN (hypertension)   . Hypothyroidism   . IBS (irritable bowel syndrome)    ?  [name] Lupus ([place]) 07/2019  . Metabolic acidosis   . Migraine    "maybe once/yr" (11/04/2014)  . Neuropathy    right leg  . Obesity   . Pancreatitis   . Pancreatitis   . PONV (postoperative nausea and vomiting) 1981 X 1   only time  . Seizures ([place]) 2003    due to tramadol reaction -- takes  depakote for migraine prevention  . Sleep apnea    does not use cpap  . Spondylolisthesis of lumbar region   . Unsteady gait   . Wears glasses     Past Surgical History:  Procedure Laterality Date  . ANKLE GANGLION CYST EXCISION Right 1980's  . ANTERIOR LAT LUMBAR FUSION N/A 12/09/2019   Procedure: Lumbar One-Two Anterolateral lumbar decompression/interbody fusion with lateral plate;  Surgeon: Kristeen Miss, MD;  Location: [facility];  Service: Neurosurgery;  Laterality: N/A;  Lumbar One-Two Anterolateral lumbar decompression/interbody fusion with lateral plate  . BACK SURGERY    . BREAST SURGERY     biopsy  . BUNIONECTOMY Left 2012  . CARPAL TUNNEL RELEASE Right 05/22/2019   Procedure: RIGHT CARPAL TUNNEL RELEASE;  Surgeon: Daryll Brod, MD;  Location: [facility];  Service: Orthopedics;  Laterality: Right;  . CARPOMETACARPEL SUSPENSION PLASTY Right 05/22/2019   Procedure: ARTHROPLASY CARPOMETACARPAL RIGHT THUMB TRAPEZIUM EXCISION TENDON TRANSFER MICRO LINK SUSPENSION;  Surgeon: Daryll Brod, MD;  Location: [facility];  Service: Orthopedics;  Laterality: Right;  . CESAREAN SECTION  1986  . COLONOSCOPY W/ BIOPSIES AND POLYPECTOMY    . EYE SURGERY Bilateral 2012   "lasered a hole in my iris cause pressure was building up"  . LAPAROSCOPIC CHOLECYSTECTOMY  1990's  . NASAL SINUS SURGERY  1980's  .  POSTERIOR LUMBAR FUSION  05/2013   L4-5; S1  . POSTERIOR LUMBAR FUSION  11/04/2014   L3-4  . SHOULDER ARTHROSCOPY W/ ROTATOR CUFF REPAIR Right 2012  . TUBAL LIGATION  1992    Family History  Problem Relation Age of Onset  . Lung cancer Mother   . Heart disease Father   . Heart disease Maternal Grandfather        PGF  . Alcohol abuse Maternal Grandfather   . Hyperlipidemia Maternal Grandfather   . Anxiety disorder Sister   . Fibromyalgia Sister   . Nephrolithiasis Daughter     Social History:  reports that she has never smoked. She has never used smokeless  tobacco. She reports current alcohol use of about 1.0 standard drink of alcohol per week. She reports that she does not use drugs. She denies tobacco use. She has 1-2 glasses of wine per week. She denies any exposure to radiation or toxins. She is retired. She worked in [employer] for [employer]. The patient is accompanied by her husband, Crystal Larsen, today.  Allergies:  Allergies  Allergen Reactions  . Ultram [Tramadol] Other (See Comments)    seizures  . Hydrocodone-Acetaminophen Itching  . Adhesive [Tape] Rash    bandaide  . Erythromycin Rash  . Latex Rash  . Neurontin [Gabapentin] Itching  . Other Rash    adhesive  . Sulfonamide Derivatives Rash    Current Medications: Current Outpatient Medications  Medication Sig Dispense Refill  . acetaminophen (TYLENOL) 500 MG tablet Take 1,000 mg by mouth 3 (three) times daily.    [name] amLODipine (NORVASC) 10 MG tablet Take 10 mg by mouth at bedtime.     [name] aspirin EC 81 MG tablet Take 81 mg by mouth at bedtime.    . Belimumab (BENLYSTA IV)     . desvenlafaxine (PRISTIQ) 50 MG 24 hr tablet Take 1 tablet (50 mg total) by mouth daily. 90 tablet 1  . dicyclomine (BENTYL) 20 MG tablet Take 20 mg by mouth daily as needed (abdominal pain).     [name] diphenhydrAMINE HCl, Sleep, (UNISOM SLEEPGELS) 50 MG CAPS Take 50 mg by mouth at bedtime.    . diphenhydrAMINE HCl, Sleep, 50 MG CAPS Unisom (diphenhydramine)    . divalproex (DEPAKOTE ER) 250 MG 24 hr tablet Take 1 tablet (250 mg total) by mouth daily. 90 tablet 3  . Fe Fum-FePoly-Vit C-Vit B3 (INTEGRA PO) Take 1 tablet by mouth daily.    . hydrocortisone (CORTEF) 10 MG tablet Take 10 mg by mouth See admin instructions. Take 15 mg in the morning and 5 mg in the afternoon    . hydroxychloroquine (PLAQUENIL) 200 MG tablet Take 200 mg by mouth 2 (two) times daily.    [name] levocetirizine (XYZAL) 5 MG tablet Take 5 mg by mouth at bedtime.    [name] levothyroxine (SYNTHROID) 200 MCG tablet Take 200 mcg by mouth daily  before breakfast.    . losartan-hydrochlorothiazide (HYZAAR) 100-12.5 MG tablet losartan 100 mg-hydrochlorothiazide 12.5 mg tablet    . Melatonin 10 MG TBDP Take 10 mg by mouth at bedtime.    . niacin (NIASPAN) 500 MG CR tablet Take 500 mg by mouth at bedtime.    . Omega-3 Fatty Acids (OMEGA 3 PO) Take 1 capsule by mouth at bedtime.    . Omeprazole 20 MG TBDD     . ondansetron (ZOFRAN) 4 MG tablet Take 1 tablet (4 mg total) by mouth every 8 (eight) hours as needed for nausea or  vomiting. 20 tablet 0  . oxymetazoline (AFRIN) 0.05 % nasal spray Place 1 spray into both nostrils 2 (two) times daily as needed for congestion.    . Probiotic Product (PROBIOTIC PO) Take 1 capsule by mouth daily.      No current facility-administered medications for this visit.    Review of Systems  Constitutional: Negative for chills, diaphoresis, fever, malaise/fatigue and weight loss (stable).       Feels "good."  HENT: Negative for congestion, ear discharge, ear pain, hearing loss, nosebleeds, sinus pain, sore throat and tinnitus.   Eyes: Negative for blurred vision.  Respiratory: Negative for cough, hemoptysis, sputum production and shortness of breath.   Cardiovascular: Negative for chest pain, palpitations and leg swelling.  Gastrointestinal: Positive for heartburn (on meds, improved). Negative for abdominal pain, blood in stool, constipation, diarrhea, melena, nausea and vomiting.       IBS  Genitourinary: Negative for dysuria, frequency, hematuria and urgency.  Musculoskeletal: Positive for joint pain (arthritis in hands, hip pain). Negative for back pain, myalgias and neck pain.  Skin: Negative for itching and rash.       Hair loss  Neurological: Positive for sensory change (neuropathy in feet, creeping up left ankle, causes balance problems). Negative for dizziness, tingling, weakness and headaches.  Endo/Heme/Allergies: Does not bruise/bleed easily.  Psychiatric/Behavioral: Negative for depression and  memory loss. The patient is not nervous/anxious and does not have insomnia.   All other systems reviewed and are negative.  Performance status (ECOG): 1  Vitals Blood pressure (!) 148/88, pulse 97, temperature 98.5 F (36.9 C), temperature source Tympanic, resp. rate 18, weight 174 lb 13.2 oz (79.3 kg), SpO2 100 %.   Physical Exam Vitals and nursing note reviewed.  Constitutional:      General: She is not in acute distress.    Appearance: She is not diaphoretic.     Comments: Cane by her side.  HENT:     Head: Normocephalic and atraumatic.     Mouth/Throat:     Mouth: Mucous membranes are moist.     Pharynx: Oropharynx is clear.  Eyes:     General: No scleral icterus.    Extraocular Movements: Extraocular movements intact.     Conjunctiva/sclera: Conjunctivae normal.     Pupils: Pupils are equal, round, and reactive to light.  Cardiovascular:     Rate and Rhythm: Normal rate and regular rhythm.     Heart sounds: Normal heart sounds. No murmur heard.   Pulmonary:     Effort: Pulmonary effort is normal. No respiratory distress.     Breath sounds: Normal breath sounds. No wheezing or rales.  Chest:     Chest wall: No tenderness.  Breasts:     Right: No axillary adenopathy or supraclavicular adenopathy.     Left: No axillary adenopathy or supraclavicular adenopathy.    Abdominal:     General: Bowel sounds are normal. There is no distension.     Palpations: Abdomen is soft. There is no mass.     Tenderness: There is no abdominal tenderness. There is no guarding or rebound.  Musculoskeletal:        General: No swelling or tenderness. Normal range of motion.     Cervical back: Normal range of motion and neck supple.  Lymphadenopathy:     Head:     Right side of head: No preauricular, posterior auricular or occipital adenopathy.     Left side of head: No preauricular, posterior auricular or occipital adenopathy.  Cervical: No cervical adenopathy.     Upper Body:      Right upper body: No supraclavicular or axillary adenopathy.     Left upper body: No supraclavicular or axillary adenopathy.     Lower Body: No right inguinal adenopathy. No left inguinal adenopathy.  Skin:    General: Skin is warm and dry.  Neurological:     Mental Status: She is alert and oriented to person, place, and time.     Gait: Gait abnormal (due to hip pain).  Psychiatric:        Behavior: Behavior normal.        Thought Content: Thought content normal.        Judgment: Judgment normal.    Appointment on 01/13/2021  Component Date Value Ref Range Status  . Color, Urine 01/13/2021 YELLOW  YELLOW Final  . APPearance 01/13/2021 CLEAR  CLEAR Final  . Specific Gravity, Urine 01/13/2021 >1.030* 1.005 - 1.030 Final  . pH 01/13/2021 5.5  5.0 - 8.0 Final  . Glucose, UA 01/13/2021 NEGATIVE  NEGATIVE mg/dL Final  . Hgb urine dipstick 01/13/2021 TRACE* NEGATIVE Final  . Bilirubin Urine 01/13/2021 NEGATIVE  NEGATIVE Final  . Ketones, ur 01/13/2021 TRACE* NEGATIVE mg/dL Final  . Protein, ur 01/13/2021 >300* NEGATIVE mg/dL Final  . Nitrite 01/13/2021 NEGATIVE  NEGATIVE Final  . Chalmers Guest 01/13/2021 NEGATIVE  NEGATIVE Final  . Squamous Epithelial / LPF 01/13/2021 0-5  0 - 5 Final  . WBC, UA 01/13/2021 0-5  0 - 5 WBC/hpf Final  . RBC / HPF 01/13/2021 0-5  0 - 5 RBC/hpf Final  . Bacteria, UA 01/13/2021 RARE* NONE SEEN Final  . Hyaline Casts, UA 01/13/2021 PRESENT   Final   Performed at [facility], [address], [place] [address], [place] [address]  . Magnesium 01/13/2021 1.7  1.7 - 2.4 mg/dL Final   Performed at [facility], [address] [place], [place] [address]  . WBC 01/13/2021 7.0  4.0 - 10.5 K/uL Final  . RBC 01/13/2021 4.08  3.87 - 5.11 MIL/uL Final  . Hemoglobin 01/13/2021 12.8  12.0 - 15.0 g/dL Final  . HCT 01/13/2021 37.9  36.0 - 46.0 % Final  . MCV 01/13/2021 92.9  80.0 - 100.0 fL Final  . MCH 01/13/2021 31.4  26.0 - 34.0 pg Final  . MCHC  01/13/2021 33.8  30.0 - 36.0 g/dL Final  . RDW 01/13/2021 14.1  11.5 - 15.5 % Final  . Platelets 01/13/2021 279  150 - 400 K/uL Final  . nRBC 01/13/2021 0.0  0.0 - 0.2 % Final  . Neutrophils Relative % 01/13/2021 82  % Final  . Neutro Abs 01/13/2021 5.8  1.7 - 7.7 K/uL Final  . Lymphocytes Relative 01/13/2021 9  % Final  . Lymphs Abs 01/13/2021 0.6* 0.7 - 4.0 K/uL Final  . Monocytes Relative 01/13/2021 7  % Final  . Monocytes Absolute 01/13/2021 0.5  0.1 - 1.0 K/uL Final  . Eosinophils Relative 01/13/2021 1  % Final  . Eosinophils Absolute 01/13/2021 0.1  0.0 - 0.5 K/uL Final  . Basophils Relative 01/13/2021 0  % Final  . Basophils Absolute 01/13/2021 0.0  0.0 - 0.1 K/uL Final  . Immature Granulocytes 01/13/2021 1  % Final  . Abs Immature Granulocytes 01/13/2021 0.04  0.00 - 0.07 K/uL Final   Performed at [facility], [address], [place], [place] [address]  . Sodium 01/13/2021 138  135 - 145 mmol/L Final  .  Potassium 01/13/2021 3.4* 3.5 - 5.1 mmol/L Final  . Chloride 01/13/2021 104  98 - 111 mmol/L Final  . CO2 01/13/2021 25  22 - 32 mmol/L Final  . Glucose, Bld 01/13/2021 64* 70 - 99 mg/dL Final   Glucose reference range applies only to samples taken after fasting for at least 8 hours.  . BUN 01/13/2021 16  8 - 23 mg/dL Final  . Creatinine, Ser 01/13/2021 1.00  0.44 - 1.00 mg/dL Final  . Calcium 01/13/2021 9.6  8.9 - 10.3 mg/dL Final  . Total Protein 01/13/2021 6.8  6.5 - 8.1 g/dL Final  . Albumin 01/13/2021 3.6  3.5 - 5.0 g/dL Final  . AST 01/13/2021 34  15 - 41 U/L Final  . ALT 01/13/2021 21  0 - 44 U/L Final  . Alkaline Phosphatase 01/13/2021 56  38 - 126 U/L Final  . Total Bilirubin 01/13/2021 0.5  0.3 - 1.2 mg/dL Final  . GFR, Estimated 01/13/2021 >60  >60 mL/min Final   Comment: (NOTE) Calculated using the CKD-EPI Creatinine Equation (2021)   . Anion gap 01/13/2021 9  5 - 15 Final   Performed at [facility], [address], [place],  [place] [address]    Assessment:  SHAVAUN OSTERLOH is a 65 y.o. female with systemic lupus erythematosis (SLE) and lupus nephritis.  She completed a course of prednisone.  She is on Benlysta (began 04/2020).  Right kidney biopsy on 08/31/2020 revealed pauci immune focal necrotizing and early sclerosing glomerulonephritis with segments of tuft inflammation and rare focal tuft necrosis and less that 5% focal fibrocellular crescent formation (in one glomerulus). She is felt to have a lupus IV-S lesion.  ANCA, anti-GBM was normal.  She is s/p cycle #5 Cytoxan (11/04/2020 - 12/30/2020).  Protein creatinine ratio has been followed: 2.51 on 11/03/2020, 0.49 on 11/18/2020, 0.78 on 12/02/2020, 1.47 on 12/16/2020, 0.84 on 12/30/2020, and 0.81 on 01/13/2021.  She has inflammatory arthritis, adrenal insufficiency, and hypothyroidism secondary to Hashimoto's thyroiditis.  Symptomatically, she feels "good."  Overall, she is tolerating treatment well with only brief fatigue after treatment.  Plan: 1.   Labs today: CBC with diff, CMP, Mg, urinalysis, protein/creatine ratio 2.   Lupus nephritis             Clinically, she is doing well.             Exam is unremarkable.  Urine protein  > 300 mg/dL.  Protein creatinine ratio is 0.81.              She is s/p 5 cycles of Cytoxan (last 12/30/2020).             Plan is cyclophosphamide 500 mg IV every 2 weeks x 6.             Labs reviewed.  Cycle #6 Cytoxan today. 3.   Hypokalemia  Potassium 3.4.  Patient to continue potassium rich foods. 4.   Cycle #6 Cytoxan today. 5.   RTC prn.  I discussed the assessment and treatment plan with the patient.  The patient was provided an opportunity to ask questions and all were answered.  The patient agreed with the plan and demonstrated an understanding of the instructions.  The patient was advised to call back if the symptoms worsen or if the condition fails to improve as anticipated.    Marcelle Bebout C. Mike Gip, MD, PhD     01/13/2021, 9:30 AM  I, Mirian Mo Tufford, am acting as  scribe for Kaci Dillie C. Mike Gip, MD, PhD.  I, Keary Hanak C. Mike Gip, MD, have reviewed the above documentation for accuracy and completeness, and I agree with the above.

## 2021-01-13 ENCOUNTER — Other Ambulatory Visit: Payer: Self-pay

## 2021-01-13 ENCOUNTER — Encounter: Payer: Self-pay | Admitting: Hematology and Oncology

## 2021-01-13 ENCOUNTER — Inpatient Hospital Stay: Payer: Medicare Other

## 2021-01-13 ENCOUNTER — Inpatient Hospital Stay: Payer: Medicare Other | Admitting: Hematology and Oncology

## 2021-01-13 VITALS — BP 148/88 | HR 97 | Temp 98.5°F | Resp 18 | Wt 174.8 lb

## 2021-01-13 VITALS — BP 148/90 | HR 88

## 2021-01-13 DIAGNOSIS — M3214 Glomerular disease in systemic lupus erythematosus: Secondary | ICD-10-CM

## 2021-01-13 DIAGNOSIS — E876 Hypokalemia: Secondary | ICD-10-CM

## 2021-01-13 LAB — CBC WITH DIFFERENTIAL/PLATELET
Abs Immature Granulocytes: 0.04 10*3/uL (ref 0.00–0.07)
Basophils Absolute: 0 10*3/uL (ref 0.0–0.1)
Basophils Relative: 0 %
Eosinophils Absolute: 0.1 10*3/uL (ref 0.0–0.5)
Eosinophils Relative: 1 %
HCT: 37.9 % (ref 36.0–46.0)
Hemoglobin: 12.8 g/dL (ref 12.0–15.0)
Immature Granulocytes: 1 %
Lymphocytes Relative: 9 %
Lymphs Abs: 0.6 10*3/uL — ABNORMAL LOW (ref 0.7–4.0)
MCH: 31.4 pg (ref 26.0–34.0)
MCHC: 33.8 g/dL (ref 30.0–36.0)
MCV: 92.9 fL (ref 80.0–100.0)
Monocytes Absolute: 0.5 10*3/uL (ref 0.1–1.0)
Monocytes Relative: 7 %
Neutro Abs: 5.8 10*3/uL (ref 1.7–7.7)
Neutrophils Relative %: 82 %
Platelets: 279 10*3/uL (ref 150–400)
RBC: 4.08 MIL/uL (ref 3.87–5.11)
RDW: 14.1 % (ref 11.5–15.5)
WBC: 7 10*3/uL (ref 4.0–10.5)
nRBC: 0 % (ref 0.0–0.2)

## 2021-01-13 LAB — URINALYSIS, COMPLETE (UACMP) WITH MICROSCOPIC
Bilirubin Urine: NEGATIVE
Glucose, UA: NEGATIVE mg/dL
Leukocytes,Ua: NEGATIVE
Nitrite: NEGATIVE
Protein, ur: >300 mg/dL — AB
Specific Gravity, Urine: >1.03 — ABNORMAL HIGH (ref 1.005–1.030)
pH: 5.5 (ref 5.0–8.0)

## 2021-01-13 LAB — COMPREHENSIVE METABOLIC PANEL
ALT: 21 U/L (ref 0–44)
AST: 34 U/L (ref 15–41)
Albumin: 3.6 g/dL (ref 3.5–5.0)
Alkaline Phosphatase: 56 U/L (ref 38–126)
Anion gap: 9 (ref 5–15)
BUN: 16 mg/dL (ref 8–23)
CO2: 25 mmol/L (ref 22–32)
Calcium: 9.6 mg/dL (ref 8.9–10.3)
Chloride: 104 mmol/L (ref 98–111)
Creatinine, Ser: 1 mg/dL (ref 0.44–1.00)
GFR, Estimated: >60 mL/min (ref >60)
Glucose, Bld: 64 mg/dL — ABNORMAL LOW (ref 70–99)
Potassium: 3.4 mmol/L — ABNORMAL LOW (ref 3.5–5.1)
Sodium: 138 mmol/L (ref 135–145)
Total Bilirubin: 0.5 mg/dL (ref 0.3–1.2)
Total Protein: 6.8 g/dL (ref 6.5–8.1)

## 2021-01-13 LAB — PROTEIN / CREATININE RATIO, URINE
Creatinine, Urine: 179 mg/dL
Protein Creatinine Ratio: 0.81 mg/mg{Cre} — ABNORMAL HIGH (ref 0.00–0.15)
Total Protein, Urine: 145 mg/dL

## 2021-01-13 LAB — MAGNESIUM: Magnesium: 1.7 mg/dL (ref 1.7–2.4)

## 2021-01-13 MED ORDER — SODIUM CHLORIDE 0.9 % IV SOLN
Freq: Once | INTRAVENOUS | Status: AC
  Filled 2021-01-13: qty 250

## 2021-01-13 MED ORDER — SODIUM CHLORIDE 0.9 % IV SOLN
10.0000 mg | Freq: Once | INTRAVENOUS | Status: AC
  Administered 2021-01-13: 10 mg via INTRAVENOUS
  Filled 2021-01-13: qty 1

## 2021-01-13 MED ORDER — PALONOSETRON HCL INJECTION 0.25 MG/5ML
0.2500 mg | Freq: Once | INTRAVENOUS | Status: AC
  Administered 2021-01-13: 0.25 mg via INTRAVENOUS
  Filled 2021-01-13: qty 5

## 2021-01-13 MED ORDER — SODIUM CHLORIDE 0.9 % IV SOLN
500.0000 mg | Freq: Once | INTRAVENOUS | Status: AC
  Administered 2021-01-13: 500 mg via INTRAVENOUS
  Filled 2021-01-13: qty 25

## 2021-01-13 NOTE — Progress Notes (Signed)
Patient received prescribed treatment in clinic. Tolerated well. Patient stable at discharge. 

## 2021-01-14 ENCOUNTER — Other Ambulatory Visit: Payer: Self-pay

## 2021-01-14 ENCOUNTER — Ambulatory Visit (HOSPITAL_BASED_OUTPATIENT_CLINIC_OR_DEPARTMENT_OTHER): Payer: Medicare Other | Attending: Orthopedic Surgery | Admitting: Physical Therapy

## 2021-01-14 DIAGNOSIS — G8929 Other chronic pain: Secondary | ICD-10-CM | POA: Insufficient documentation

## 2021-01-14 DIAGNOSIS — M545 Low back pain, unspecified: Secondary | ICD-10-CM | POA: Diagnosis present

## 2021-01-14 DIAGNOSIS — M6281 Muscle weakness (generalized): Secondary | ICD-10-CM | POA: Diagnosis present

## 2021-01-14 DIAGNOSIS — M25551 Pain in right hip: Secondary | ICD-10-CM | POA: Diagnosis present

## 2021-01-14 DIAGNOSIS — R2681 Unsteadiness on feet: Secondary | ICD-10-CM | POA: Insufficient documentation

## 2021-01-14 NOTE — Therapy (Signed)
[place], [place], [address] Phone: [phone]   Fax:  [phone]  Physical Therapy Treatment  Patient Details  Name: Crystal Larsen MRN: [id number] Date of Birth: 1955/12/25 Referring Provider (PT): Rod Can, MD   Encounter Date: 01/14/2021   PT End of Session - 01/14/21 1243    Visit Number 5    Number of Visits 16    Date for PT Re-Evaluation 02/23/21    Authorization Type UHC Medicare    Progress Note Due on Visit 10    PT Start Time 1148    PT Stop Time 1235    PT Time Calculation (min) 47 min    Activity Tolerance Patient tolerated treatment well;Patient limited by fatigue;Patient limited by pain    Behavior During Therapy [facility] for tasks assessed/performed           Past Medical History:  Diagnosis Date  . Acute kidney injury ([place])   . Adrenal insufficiency ([place])   . Anemia   . Anxiety   . Arthritis    "left foot; left hand; right shoulder; back" (11/04/2014)  . Elevated liver enzymes   . Gallstones   . GERD (gastroesophageal reflux disease)   . History of gout   . HTN (hypertension)   . Hypothyroidism   . IBS (irritable bowel syndrome)    ?  [name] Lupus ([place]) 07/2019  . Metabolic acidosis   . Migraine    "maybe once/yr" (11/04/2014)  . Neuropathy    right leg  . Obesity   . Pancreatitis   . Pancreatitis   . PONV (postoperative nausea and vomiting) 1981 X 1   only time  . Seizures ([place]) 2003    due to tramadol reaction -- takes depakote for migraine prevention  . Sleep apnea    does not use cpap  . Spondylolisthesis of lumbar region   . Unsteady gait   . Wears glasses     Past Surgical History:  Procedure Laterality Date  . ANKLE GANGLION CYST EXCISION Right 1980's  . ANTERIOR LAT LUMBAR FUSION N/A 12/09/2019   Procedure: Lumbar One-Two Anterolateral lumbar decompression/interbody fusion with lateral plate;  Surgeon: Kristeen Miss, MD;  Location: [place];   Service: Neurosurgery;  Laterality: N/A;  Lumbar One-Two Anterolateral lumbar decompression/interbody fusion with lateral plate  . BACK SURGERY    . BREAST SURGERY     biopsy  . BUNIONECTOMY Left 2012  . CARPAL TUNNEL RELEASE Right 05/22/2019   Procedure: RIGHT CARPAL TUNNEL RELEASE;  Surgeon: Daryll Brod, MD;  Location: [place];  Service: Orthopedics;  Laterality: Right;  . CARPOMETACARPEL SUSPENSION PLASTY Right 05/22/2019   Procedure: ARTHROPLASY CARPOMETACARPAL RIGHT THUMB TRAPEZIUM EXCISION TENDON TRANSFER MICRO LINK SUSPENSION;  Surgeon: Daryll Brod, MD;  Location: [place];  Service: Orthopedics;  Laterality: Right;  . CESAREAN SECTION  1986  . COLONOSCOPY W/ BIOPSIES AND POLYPECTOMY    . EYE SURGERY Bilateral 2012   "lasered a hole in my iris cause pressure was building up"  . LAPAROSCOPIC CHOLECYSTECTOMY  1990's  . NASAL SINUS SURGERY  1980's  . POSTERIOR LUMBAR FUSION  05/2013   L4-5; S1  . POSTERIOR LUMBAR FUSION  11/04/2014   L3-4  . SHOULDER ARTHROSCOPY W/ ROTATOR CUFF REPAIR Right 2012  . TUBAL LIGATION  1992    There were no vitals filed for this visit.   Subjective Assessment - 01/14/21 1152    Subjective Pt reports nothing new or different.  Pt states that she does feel she is getting better.    Pertinent History Lupus, lumbar fusion L2-S1 (4 surgeries)    Limitations Sitting;Walking;Standing    How long can you sit comfortably? recliner helps, if sitting straight 5 min    How long can you stand comfortably? back gets tired, 20 min    How long can you walk comfortably? it's always painful    Diagnostic tests lumbar MRI 2021: mild retrolisthesis L1/2 with some foraminal narrowing    Patient Stated Goals improve pain, will be new grandparents in April and hopes to be involved    Currently in Pain? Yes    Pain Score 2     Pain Location Hip    Pain Orientation Right    Pain Descriptors / Indicators Aching    Pain Type Chronic pain     Pain Onset More than a month ago                             [facility] Adult PT Treatment/Exercise - 01/14/21 0001      Ambulation/Gait   Ambulation Distance (Feet) 50 Feet    Assistive device Straight cane    Gait Pattern Step-through pattern;Trendelenburg;Decreased weight shift to right;Lateral hip instability;Abducted - left;Abducted- right   L hip trendelenburg   Ambulation Surface Level;Indoor    Pre-Gait Activities forward stepping x10 bilat; focus on pelvic stability/no hip drop with visual cues    Gait Comments Pt asked to focus on heel strike, decreasing bilat feet abduction and cues to decrease L hip trendelenburg   Pt still does not have adequate strength/control to decrease L hip trendelenburg     Knee/Hip Exercises: Aerobic   Nustep 6 min lvl 6      Knee/Hip Exercises: Standing   Hip Abduction Stengthening;Both;Knee straight;2 sets;5 reps    Hip Extension Stengthening;Both;10 reps;Knee straight    Wall Squat 2 sets;10 reps    Wall Squat Limitations With posterior pelvic tilt    SLS Captain morgan against wall attempted; however, pt unable to perform without compensation    Other Standing Knee Exercises Hip hike 2x10 bilat limited due to soreness;    Other Standing Knee Exercises Forward/backward foot tapping x10 alternating                    PT Short Term Goals - 12/29/20 1257      PT SHORT TERM GOAL #1   Title Pt will be ind with initial HEP    Time 3    Period Weeks    Status New    Target Date 01/19/21      PT SHORT TERM GOAL #2   Title Pt will demo improved gait pattern to include improved weight shifting into Rt LE and reduce degree of Trendelenburg bil secondary to improved gluteal use in closed chain.    Time 4    Period Weeks    Status New    Target Date 01/26/21      PT SHORT TERM GOAL #3   Title Pt will report reduced pain with household ambulation by at least 30%    Time 4    Period Weeks    Status New    Target Date  01/26/21      PT SHORT TERM GOAL #4   Title Pt will be able to sit in upright chair (vs recliner) for at least 15 mintues for mealtime with at least 30%  less pain.      PT SHORT TERM GOAL #5   Title Pt will demo 5x sit to stand without UE use in </= 13 sec    Baseline 15 sec, use of UEs    Time 4    Period Weeks    Status New    Target Date 01/26/21             PT Long Term Goals - 12/29/20 1303      PT LONG TERM GOAL #1   Title Pt will report improved endurance for light ADLs and household ambulation for up to 20 min before needing seated break.    Time 8    Period Weeks    Status New    Target Date 02/23/21      PT LONG TERM GOAL #2   Title Patient able to safely amb community distances using cane.    Time 8    Period Weeks    Status New    Target Date 02/23/21      PT LONG TERM GOAL #3   Title Improved Bil hip strength to >= 4+/5 to improve overall function    Baseline bil hips 4/5    Time 8    Period Weeks    Status New    Target Date 02/23/21      PT LONG TERM GOAL #4   Title Improved 5x sit to stand to </= 11 sec to reduce fall risk    Baseline 15 sec    Time 8    Period Weeks    Status New    Target Date 02/23/21      PT LONG TERM GOAL #5   Title Improved FOTO to >/= 50% to demo improved function    Baseline 33%    Time 8    Period Weeks    Status New    Target Date 02/23/21                 Plan - 01/14/21 1226    Clinical Impression Statement Treatment focused on progressing her core and hip strengthening. Pt feels it in her low back during standing hip exercises -- discussed resting in between to not exacerbate low back. Discussed decreasing L trendelenburg while performing pre gait activities. Pt does well given visual and tactile cues to decrease her compensatory movements.    Personal Factors and Comorbidities Comorbidity 1;Comorbidity 2;Time since onset of injury/illness/exacerbation    Comorbidities lumbar fusion L2-S1, Lupus     Examination-Activity Limitations Locomotion Level;Sit;Lift;Stairs;Stand;Bend    Examination-Participation Restrictions Meal Prep;Laundry;Shop;Community Activity;Cleaning    Stability/Clinical Decision Making Stable/Uncomplicated    Rehab Potential Good    PT Frequency 2x / week    PT Duration 8 weeks    PT Treatment/Interventions ADLs/Self Care Home Management;Cryotherapy;Electrical Stimulation;Moist Heat;Iontophoresis 4mg /ml Dexamethasone;Gait training;Stair training;Functional mobility training;Therapeutic exercise;Therapeutic activities;Neuromuscular re-education;Balance training;Patient/family education;Energy conservation;Passive range of motion;Manual techniques;Taping;Spinal Manipulations;Joint Manipulations    PT Next Visit Plan NuStep, review HEP, gait activities for closed chain weight shifting, core and hip strength as tol (continue standing hip abduction/upright skating, abductor machines, leg press).    PT Home Exercise Plan Access Code: [email or web address]    Consulted and Agree with Plan of Care Patient           Patient will benefit from skilled therapeutic intervention in order to improve the following deficits and impairments:  Abnormal gait,Decreased range of motion,Difficulty walking,Decreased endurance,Pain,Decreased activity tolerance,Impaired flexibility,Decreased balance,Decreased mobility,Decreased strength  Visit Diagnosis: Pain in right  hip  Chronic bilateral low back pain, unspecified whether sciatica present  Muscle weakness (generalized)  Unsteadiness on feet     Problem List Patient Active Problem List   Diagnosis Date Noted  . Hypokalemia 01/12/2021  . Encounter for antineoplastic chemotherapy 11/18/2020  . Systemic lupus erythematosus ([place]) 11/03/2020  . Lupus nephritis ([place]) 11/03/2020  . Proteinuria 11/03/2020  . Other spondylosis with radiculopathy, lumbar region 12/09/2019  . Secondary adrenal insufficiency ([place]) 04/03/2019  . Metabolic acidosis,  normal anion gap (NAG) 04/02/2019  . HTN (hypertension)   . Adrenal insufficiency ([place]) 04/01/2019  . Hyponatremia 04/01/2019  . Hypothyroidism 04/01/2019  . AKI (acute kidney injury) ([place]) 04/01/2019  . Lumbar stenosis with neurogenic claudication 02/19/2017  . Migraine with aura and without status migrainosus, not intractable 07/11/2016  . Spondylolisthesis of lumbar region 11/04/2014  . Postconcussional syndrome 09/30/2013  . Post-concussion headache 09/30/2013  . Esophageal reflux 10/11/2011  . Benign neoplasm of colon 10/11/2011  . Gastritis, chronic 10/11/2011  . Guaiac + stool 09/28/2011  . IBS (irritable bowel syndrome) 09/28/2011  . GERD (gastroesophageal reflux disease) 09/28/2011  . Migraine syndrome 09/28/2011    [facility] [address] PT, DPT 01/14/2021, 12:51 PM  [facility] [address] [place], [place], [address] Phone: [phone]   Fax:  [phone]  Name: COLINDA BARTH MRN: [id number] Date of Birth: 1956/07/26

## 2021-01-18 ENCOUNTER — Ambulatory Visit (HOSPITAL_BASED_OUTPATIENT_CLINIC_OR_DEPARTMENT_OTHER): Payer: Medicare Other | Attending: Orthopedic Surgery | Admitting: Physical Therapy

## 2021-01-18 ENCOUNTER — Other Ambulatory Visit: Payer: Self-pay

## 2021-01-18 DIAGNOSIS — G8929 Other chronic pain: Secondary | ICD-10-CM | POA: Insufficient documentation

## 2021-01-18 DIAGNOSIS — M25551 Pain in right hip: Secondary | ICD-10-CM | POA: Insufficient documentation

## 2021-01-18 DIAGNOSIS — R2681 Unsteadiness on feet: Secondary | ICD-10-CM | POA: Insufficient documentation

## 2021-01-18 DIAGNOSIS — M6281 Muscle weakness (generalized): Secondary | ICD-10-CM | POA: Insufficient documentation

## 2021-01-18 DIAGNOSIS — M545 Low back pain, unspecified: Secondary | ICD-10-CM | POA: Insufficient documentation

## 2021-01-18 NOTE — Therapy (Signed)
[place] [address] [place], [place], [address] Phone: [phone]   Fax:  [phone]  Physical Therapy Treatment  Patient Details  Name: Crystal Larsen MRN: [id number] Date of Birth: [birth year] Referring Provider (PT): Rod Can, MD   Encounter Date: 01/18/2021   PT End of Session - 01/18/21 1240    Visit Number 6    Number of Visits 16    Date for PT Re-Evaluation 02/23/21    Authorization Type UHC Medicare    Progress Note Due on Visit 10    PT Start Time 1148    PT Stop Time 1230    PT Time Calculation (min) 42 min    Activity Tolerance Patient tolerated treatment well;Patient limited by fatigue;Patient limited by pain    Behavior During Therapy [facility] for tasks assessed/performed           Past Medical History:  Diagnosis Date  . Acute kidney injury ([place])   . Adrenal insufficiency ([place])   . Anemia   . Anxiety   . Arthritis    "left foot; left hand; right shoulder; back" (11/04/2014)  . Elevated liver enzymes   . Gallstones   . GERD (gastroesophageal reflux disease)   . History of gout   . HTN (hypertension)   . Hypothyroidism   . IBS (irritable bowel syndrome)    ?  [name] Lupus ([place]) 07/2019  . Metabolic acidosis   . Migraine    "maybe once/yr" (11/04/2014)  . Neuropathy    right leg  . Obesity   . Pancreatitis   . Pancreatitis   . PONV (postoperative nausea and vomiting) 1981 X 1   only time  . Seizures ([place]) 2003    due to tramadol reaction -- takes depakote for migraine prevention  . Sleep apnea    does not use cpap  . Spondylolisthesis of lumbar region   . Unsteady gait   . Wears glasses     Past Surgical History:  Procedure Laterality Date  . ANKLE GANGLION CYST EXCISION Right 1980's  . ANTERIOR LAT LUMBAR FUSION N/A 12/09/2019   Procedure: Lumbar One-Two Anterolateral lumbar decompression/interbody fusion with lateral plate;  Surgeon: Kristeen Miss, MD;  Location: [place];   Service: Neurosurgery;  Laterality: N/A;  Lumbar One-Two Anterolateral lumbar decompression/interbody fusion with lateral plate  . BACK SURGERY    . BREAST SURGERY     biopsy  . BUNIONECTOMY Left 2012  . CARPAL TUNNEL RELEASE Right 05/22/2019   Procedure: RIGHT CARPAL TUNNEL RELEASE;  Surgeon: Daryll Brod, MD;  Location: [place];  Service: Orthopedics;  Laterality: Right;  . CARPOMETACARPEL SUSPENSION PLASTY Right 05/22/2019   Procedure: ARTHROPLASY CARPOMETACARPAL RIGHT THUMB TRAPEZIUM EXCISION TENDON TRANSFER MICRO LINK SUSPENSION;  Surgeon: Daryll Brod, MD;  Location: [place];  Service: Orthopedics;  Laterality: Right;  . CESAREAN SECTION  1986  . COLONOSCOPY W/ BIOPSIES AND POLYPECTOMY    . EYE SURGERY Bilateral 2012   "lasered a hole in my iris cause pressure was building up"  . LAPAROSCOPIC CHOLECYSTECTOMY  1990's  . NASAL SINUS SURGERY  1980's  . POSTERIOR LUMBAR FUSION  05/2013   L4-5; S1  . POSTERIOR LUMBAR FUSION  11/04/2014   L3-4  . SHOULDER ARTHROSCOPY W/ ROTATOR CUFF REPAIR Right 2012  . TUBAL LIGATION  1992    There were no vitals filed for this visit.   Subjective Assessment - 01/18/21 1153    Subjective Pt states after her chemotherapy treatment  she felt wiped out. She is feeling better now. Pt states she will be starting the chemo pill but is unsure when she will start. Pt states she did all the exercises Saturday and a portion of them on Monday.    Pertinent History Lupus, lumbar fusion L2-S1 (4 surgeries)    Limitations Sitting;Walking;Standing    How long can you sit comfortably? recliner helps, if sitting straight 5 min    How long can you stand comfortably? back gets tired, 20 min    How long can you walk comfortably? it's always painful    Diagnostic tests lumbar MRI 2021: mild retrolisthesis L1/2 with some foraminal narrowing    Patient Stated Goals improve pain, will be new grandparents in April and hopes to be involved     Currently in Pain? Yes    Pain Score 3     Pain Location Hip    Pain Orientation Right    Pain Descriptors / Indicators Aching    Pain Type Chronic pain    Pain Onset More than a month ago                             [facility] Adult PT Treatment/Exercise - 01/18/21 0001      Knee/Hip Exercises: Aerobic   Nustep 5 min Lvl 4      Knee/Hip Exercises: Standing   Hip Extension Stengthening;10 reps;Knee straight;2 sets;Right;Left    Extension Limitations modified prone on table    Forward Step Up Right;Left;10 reps;Hand Hold: 1;Step Height: 2"    Step Down 10 reps    Wall Squat 2 sets;10 reps    Wall Squat Limitations With posterior pelvic tilt    Other Standing Knee Exercises single leg wall squat marches x10                  PT Education - 01/18/21 1240    Education Details Discussed PT options for pain relief (taping, ionto) and steroid shot for her R hip bursa.    Person(s) Educated Patient    Methods Explanation;Handout    Comprehension Verbalized understanding;Tactile cues required            PT Short Term Goals - 12/29/20 1257      PT SHORT TERM GOAL #1   Title Pt will be ind with initial HEP    Time 3    Period Weeks    Status New    Target Date 01/19/21      PT SHORT TERM GOAL #2   Title Pt will demo improved gait pattern to include improved weight shifting into Rt LE and reduce degree of Trendelenburg bil secondary to improved gluteal use in closed chain.    Time 4    Period Weeks    Status New    Target Date 01/26/21      PT SHORT TERM GOAL #3   Title Pt will report reduced pain with household ambulation by at least 30%    Time 4    Period Weeks    Status New    Target Date 01/26/21      PT SHORT TERM GOAL #4   Title Pt will be able to sit in upright chair (vs recliner) for at least 15 mintues for mealtime with at least 30% less pain.      PT SHORT TERM GOAL #5   Title Pt will demo 5x sit to stand without UE use in </=  13 sec     Baseline 15 sec, use of UEs    Time 4    Period Weeks    Status New    Target Date 01/26/21             PT Long Term Goals - 12/29/20 1303      PT LONG TERM GOAL #1   Title Pt will report improved endurance for light ADLs and household ambulation for up to 20 min before needing seated break.    Time 8    Period Weeks    Status New    Target Date 02/23/21      PT LONG TERM GOAL #2   Title Patient able to safely amb community distances using cane.    Time 8    Period Weeks    Status New    Target Date 02/23/21      PT LONG TERM GOAL #3   Title Improved Bil hip strength to >= 4+/5 to improve overall function    Baseline bil hips 4/5    Time 8    Period Weeks    Status New    Target Date 02/23/21      PT LONG TERM GOAL #4   Title Improved 5x sit to stand to </= 11 sec to reduce fall risk    Baseline 15 sec    Time 8    Period Weeks    Status New    Target Date 02/23/21      PT LONG TERM GOAL #5   Title Improved FOTO to >/= 50% to demo improved function    Baseline 33%    Time 8    Period Weeks    Status New    Target Date 02/23/21                 Plan - 01/18/21 1238    Clinical Impression Statement Treatment focused on modifying HEP. Discussed hip extensor/glute strengthening that does not irritate her low back. Pt demos reduced trendelenburg with forward step downs given a hand hold and visual cueing. Pt with difficulty performing step ups without increased compensation. Pt continues to progress well with therapy.    Personal Factors and Comorbidities Comorbidity 1;Comorbidity 2;Time since onset of injury/illness/exacerbation    Comorbidities lumbar fusion L2-S1, Lupus    Examination-Activity Limitations Locomotion Level;Sit;Lift;Stairs;Stand;Bend    Examination-Participation Restrictions Meal Prep;Laundry;Shop;Community Activity;Cleaning    Stability/Clinical Decision Making Stable/Uncomplicated    Rehab Potential Good    PT Frequency 2x / week     PT Duration 8 weeks    PT Treatment/Interventions ADLs/Self Care Home Management;Cryotherapy;Electrical Stimulation;Moist Heat;Iontophoresis 4mg /ml Dexamethasone;Gait training;Stair training;Functional mobility training;Therapeutic exercise;Therapeutic activities;Neuromuscular re-education;Balance training;Patient/family education;Energy conservation;Passive range of motion;Manual techniques;Taping;Spinal Manipulations;Joint Manipulations    PT Next Visit Plan NuStep, review HEP, gait activities for closed chain weight shifting, core and hip strength as tol (continue standing hip abduction/upright skating, abductor machines, leg press).    PT Home Exercise Plan Access Code: [email or web address]    Consulted and Agree with Plan of Care Patient           Patient will benefit from skilled therapeutic intervention in order to improve the following deficits and impairments:  Abnormal gait,Decreased range of motion,Difficulty walking,Decreased endurance,Pain,Decreased activity tolerance,Impaired flexibility,Decreased balance,Decreased mobility,Decreased strength  Visit Diagnosis: Pain in right hip  Chronic bilateral low back pain, unspecified whether sciatica present  Muscle weakness (generalized)  Unsteadiness on feet     Problem List Patient Active Problem List  Diagnosis Date Noted  . Hypokalemia 01/12/2021  . Encounter for antineoplastic chemotherapy 11/18/2020  . Systemic lupus erythematosus ([place]) 11/03/2020  . Lupus nephritis ([place]) 11/03/2020  . Proteinuria 11/03/2020  . Other spondylosis with radiculopathy, lumbar region 12/09/2019  . Secondary adrenal insufficiency ([place]) 04/03/2019  . Metabolic acidosis, normal anion gap (NAG) 04/02/2019  . HTN (hypertension)   . Adrenal insufficiency ([place]) 04/01/2019  . Hyponatremia 04/01/2019  . Hypothyroidism 04/01/2019  . AKI (acute kidney injury) ([place]) 04/01/2019  . Lumbar stenosis with neurogenic claudication 02/19/2017  . Migraine with aura  and without status migrainosus, not intractable 07/11/2016  . Spondylolisthesis of lumbar region 11/04/2014  . Postconcussional syndrome 09/30/2013  . Post-concussion headache 09/30/2013  . Esophageal reflux 10/11/2011  . Benign neoplasm of colon 10/11/2011  . Gastritis, chronic 10/11/2011  . Guaiac + stool 09/28/2011  . IBS (irritable bowel syndrome) 09/28/2011  . GERD (gastroesophageal reflux disease) 09/28/2011  . Migraine syndrome 09/28/2011    [facility] [address] PT, DPT 01/18/2021, 12:43 PM  [facility] [address] [place], [place], [address] Phone: [phone]   Fax:  [phone]  Name: Crystal Larsen MRN: [id number] Date of Birth: 08-26-1956

## 2021-01-20 ENCOUNTER — Other Ambulatory Visit: Payer: Self-pay

## 2021-01-20 ENCOUNTER — Ambulatory Visit (HOSPITAL_BASED_OUTPATIENT_CLINIC_OR_DEPARTMENT_OTHER): Payer: Medicare Other | Admitting: Physical Therapy

## 2021-01-20 DIAGNOSIS — R2681 Unsteadiness on feet: Secondary | ICD-10-CM

## 2021-01-20 DIAGNOSIS — M545 Low back pain, unspecified: Secondary | ICD-10-CM

## 2021-01-20 DIAGNOSIS — M6281 Muscle weakness (generalized): Secondary | ICD-10-CM

## 2021-01-20 DIAGNOSIS — M25551 Pain in right hip: Secondary | ICD-10-CM

## 2021-01-20 DIAGNOSIS — M7061 Trochanteric bursitis, right hip: Secondary | ICD-10-CM | POA: Diagnosis not present

## 2021-01-20 NOTE — Therapy (Signed)
[facility] [address] [place], [place], [address] Phone: [phone]   Fax:  [phone]  Physical Therapy Treatment  Patient Details  Name: Crystal Larsen MRN: [id number] Date of Birth: 12-31-1955 Referring Provider (PT): Samson Frederic, MD   Encounter Date: 01/20/2021   PT End of Session - 01/20/21 1253    Visit Number 7    Number of Visits 16    Date for PT Re-Evaluation 02/23/21    Authorization Type UHC Medicare    Progress Note Due on Visit 10    PT Start Time 1150    PT Stop Time 1230    PT Time Calculation (min) 40 min    Activity Tolerance Patient tolerated treatment well;Patient limited by fatigue    Behavior During Therapy [facility] for tasks assessed/performed           Past Medical History:  Diagnosis Date  . Acute kidney injury (HCC)   . Adrenal insufficiency (HCC)   . Anemia   . Anxiety   . Arthritis    "left foot; left hand; right shoulder; back" (11/04/2014)  . Elevated liver enzymes   . Gallstones   . GERD (gastroesophageal reflux disease)   . History of gout   . HTN (hypertension)   . Hypothyroidism   . IBS (irritable bowel syndrome)    ?  [name] Lupus (HCC) 07/2019  . Metabolic acidosis   . Migraine    "maybe once/yr" (11/04/2014)  . Neuropathy    right leg  . Obesity   . Pancreatitis   . Pancreatitis   . PONV (postoperative nausea and vomiting) 1981 X 1   only time  . Seizures (HCC) 2003    due to tramadol reaction -- takes depakote for migraine prevention  . Sleep apnea    does not use cpap  . Spondylolisthesis of lumbar region   . Unsteady gait   . Wears glasses     Past Surgical History:  Procedure Laterality Date  . ANKLE GANGLION CYST EXCISION Right 1980's  . ANTERIOR LAT LUMBAR FUSION N/A 12/09/2019   Procedure: Lumbar One-Two Anterolateral lumbar decompression/interbody fusion with lateral plate;  Surgeon: Barnett Abu, MD;  Location: [facility] OR;  Service: Neurosurgery;   Laterality: N/A;  Lumbar One-Two Anterolateral lumbar decompression/interbody fusion with lateral plate  . BACK SURGERY    . BREAST SURGERY     biopsy  . BUNIONECTOMY Left 2012  . CARPAL TUNNEL RELEASE Right 05/22/2019   Procedure: RIGHT CARPAL TUNNEL RELEASE;  Surgeon: Cindee Salt, MD;  Location: [facility];  Service: Orthopedics;  Laterality: Right;  . CARPOMETACARPEL SUSPENSION PLASTY Right 05/22/2019   Procedure: ARTHROPLASY CARPOMETACARPAL RIGHT THUMB TRAPEZIUM EXCISION TENDON TRANSFER MICRO LINK SUSPENSION;  Surgeon: Cindee Salt, MD;  Location: [facility];  Service: Orthopedics;  Laterality: Right;  . CESAREAN SECTION  1986  . COLONOSCOPY W/ BIOPSIES AND POLYPECTOMY    . EYE SURGERY Bilateral 2012   "lasered a hole in my iris cause pressure was building up"  . LAPAROSCOPIC CHOLECYSTECTOMY  1990's  . NASAL SINUS SURGERY  1980's  . POSTERIOR LUMBAR FUSION  05/2013   L4-5; S1  . POSTERIOR LUMBAR FUSION  11/04/2014   L3-4  . SHOULDER ARTHROSCOPY W/ ROTATOR CUFF REPAIR Right 2012  . TUBAL LIGATION  1992    There were no vitals filed for this visit.   Subjective Assessment - 01/20/21 1153    Subjective Pt states she was sore after last session. Exercises  have been good.    Pertinent History Lupus, lumbar fusion L2-S1 (4 surgeries)    Limitations Sitting;Walking;Standing    How long can you sit comfortably? recliner helps, if sitting straight 5 min    How long can you stand comfortably? back gets tired, 20 min    How long can you walk comfortably? it's always painful    Diagnostic tests lumbar MRI 2021: mild retrolisthesis L1/2 with some foraminal narrowing    Patient Stated Goals improve pain, will be new grandparents in April and hopes to be involved    Currently in Pain? Yes    Pain Score 4     Pain Location Hip    Pain Orientation Right    Pain Descriptors / Indicators Aching    Pain Type Chronic pain    Pain Onset More than a month ago                              [facility] Adult PT Treatment/Exercise - 01/20/21 0001      Ambulation/Gait   Ambulation Distance (Feet) 50 Feet    Assistive device Straight cane    Gait Pattern Step-through pattern;Trendelenburg;Decreased weight shift to right;Lateral hip instability;Abducted - left;Abducted- right    Ambulation Surface Level;Indoor    Gait Comments Re-emphasized decreased foot abduction      Knee/Hip Exercises: Aerobic   Recumbent Bike L2 x 3.5 min forward, 3.5 min backward      Knee/Hip Exercises: Machines for Strengthening   Other Machine Shuttle 75#: double leg 3x10; DL with ball squeeze 2x10; R & then L single leg 3x10 sec hold; R and L sidelying single leg 2x10 25#      Knee/Hip Exercises: Standing   Hip Abduction Stengthening;Both;Knee straight;2 sets;5 reps    Abduction Limitations Sliding foot on wash rag with tangerine tband around calf    Other Standing Knee Exercises Feet together on foam x30 sec, head turns x30 sec, head nods x30 sec                    PT Short Term Goals - 01/20/21 1252      PT SHORT TERM GOAL #1   Title Pt will be ind with initial HEP    Time 3    Period Weeks    Status Achieved    Target Date 01/19/21      PT SHORT TERM GOAL #2   Title Pt will demo improved gait pattern to include improved weight shifting into Rt LE and reduce degree of Trendelenburg bil secondary to improved gluteal use in closed chain.    Time 4    Period Weeks    Status Partially Met    Target Date 01/26/21      PT SHORT TERM GOAL #3   Title Pt will report reduced pain with household ambulation by at least 30%    Time 4    Period Weeks    Status On-going    Target Date 01/26/21      PT SHORT TERM GOAL #4   Title Pt will be able to sit in upright chair (vs recliner) for at least 15 mintues for mealtime with at least 30% less pain.    Status On-going      PT SHORT TERM GOAL #5   Title Pt will demo 5x sit to stand without UE use in </=  13 sec    Baseline 15 sec, use of  UEs    Time 4    Period Weeks    Status On-going    Target Date 01/26/21             PT Long Term Goals - 12/29/20 1303      PT LONG TERM GOAL #1   Title Pt will report improved endurance for light ADLs and household ambulation for up to 20 min before needing seated break.    Time 8    Period Weeks    Status New    Target Date 02/23/21      PT LONG TERM GOAL #2   Title Patient able to safely amb community distances using cane.    Time 8    Period Weeks    Status New    Target Date 02/23/21      PT LONG TERM GOAL #3   Title Improved Bil hip strength to >= 4+/5 to improve overall function    Baseline bil hips 4/5    Time 8    Period Weeks    Status New    Target Date 02/23/21      PT LONG TERM GOAL #4   Title Improved 5x sit to stand to </= 11 sec to reduce fall risk    Baseline 15 sec    Time 8    Period Weeks    Status New    Target Date 02/23/21      PT LONG TERM GOAL #5   Title Improved FOTO to >/= 50% to demo improved function    Baseline 33%    Time 8    Period Weeks    Status New    Target Date 02/23/21                 Plan - 01/20/21 1248    Clinical Impression Statement Treatment focused on progressing her current HEP. Pt with increasing hip abduction strength -- progressed pt to place resistance band below her knee vs above knee. Initiated  adductor muscle strengthening and balance exercises with narrow BOS to further integrate and strengthen lateral sub-system for improved pelvic/hip stability.    Personal Factors and Comorbidities Comorbidity 1;Comorbidity 2;Time since onset of injury/illness/exacerbation    Comorbidities lumbar fusion L2-S1, Lupus    Examination-Activity Limitations Locomotion Level;Sit;Lift;Stairs;Stand;Bend    Examination-Participation Restrictions Meal Prep;Laundry;Shop;Community Activity;Cleaning    Stability/Clinical Decision Making Stable/Uncomplicated    Rehab Potential Good    PT  Frequency 2x / week    PT Duration 8 weeks    PT Treatment/Interventions ADLs/Self Care Home Management;Cryotherapy;Electrical Stimulation;Moist Heat;Iontophoresis 4mg /ml Dexamethasone;Gait training;Stair training;Functional mobility training;Therapeutic exercise;Therapeutic activities;Neuromuscular re-education;Balance training;Patient/family education;Energy conservation;Passive range of motion;Manual techniques;Taping;Spinal Manipulations;Joint Manipulations    PT Next Visit Plan Re test 5xSTS. NuStep, review HEP, gait activities for closed chain weight shifting, core and hip strength as tol (continue standing hip abduction/upright skating, abductor machines, leg press). Increase adductor strengthening!    PT Home Exercise Plan Access Code: [email or web address]    Consulted and Agree with Plan of Care Patient           Patient will benefit from skilled therapeutic intervention in order to improve the following deficits and impairments:  Abnormal gait,Decreased range of motion,Difficulty walking,Decreased endurance,Pain,Decreased activity tolerance,Impaired flexibility,Decreased balance,Decreased mobility,Decreased strength  Visit Diagnosis: Pain in right hip  Chronic bilateral low back pain, unspecified whether sciatica present  Muscle weakness (generalized)  Unsteadiness on feet     Problem List Patient Active Problem List   Diagnosis Date Noted  .  Hypokalemia 01/12/2021  . Encounter for antineoplastic chemotherapy 11/18/2020  . Systemic lupus erythematosus ([place]) 11/03/2020  . Lupus nephritis ([place]) 11/03/2020  . Proteinuria 11/03/2020  . Other spondylosis with radiculopathy, lumbar region 12/09/2019  . Secondary adrenal insufficiency ([place]) 04/03/2019  . Metabolic acidosis, normal anion gap (NAG) 04/02/2019  . HTN (hypertension)   . Adrenal insufficiency ([place]) 04/01/2019  . Hyponatremia 04/01/2019  . Hypothyroidism 04/01/2019  . AKI (acute kidney injury) ([place]) 04/01/2019  . Lumbar  stenosis with neurogenic claudication 02/19/2017  . Migraine with aura and without status migrainosus, not intractable 07/11/2016  . Spondylolisthesis of lumbar region 11/04/2014  . Postconcussional syndrome 09/30/2013  . Post-concussion headache 09/30/2013  . Esophageal reflux 10/11/2011  . Benign neoplasm of colon 10/11/2011  . Gastritis, chronic 10/11/2011  . Guaiac + stool 09/28/2011  . IBS (irritable bowel syndrome) 09/28/2011  . GERD (gastroesophageal reflux disease) 09/28/2011  . Migraine syndrome 09/28/2011    [facility] [address] PT, DPT 01/20/2021, 12:55 PM  [facility] [place], [place], [address] Phone: [phone]   Fax:  [phone]  Name: JANELYS GLASSNER MRN: [id number] Date of Birth: [birth year]

## 2021-01-25 ENCOUNTER — Other Ambulatory Visit: Payer: Self-pay

## 2021-01-25 ENCOUNTER — Ambulatory Visit (HOSPITAL_BASED_OUTPATIENT_CLINIC_OR_DEPARTMENT_OTHER): Payer: Medicare Other | Attending: Orthopedic Surgery | Admitting: Physical Therapy

## 2021-01-25 DIAGNOSIS — M6281 Muscle weakness (generalized): Secondary | ICD-10-CM | POA: Insufficient documentation

## 2021-01-25 DIAGNOSIS — G8929 Other chronic pain: Secondary | ICD-10-CM | POA: Insufficient documentation

## 2021-01-25 DIAGNOSIS — M545 Low back pain, unspecified: Secondary | ICD-10-CM | POA: Insufficient documentation

## 2021-01-25 DIAGNOSIS — M25551 Pain in right hip: Secondary | ICD-10-CM | POA: Diagnosis present

## 2021-01-25 DIAGNOSIS — R2681 Unsteadiness on feet: Secondary | ICD-10-CM | POA: Diagnosis present

## 2021-01-25 NOTE — Therapy (Signed)
[facility] [address] [place], [place], [address] Phone: [phone]   Fax:  [phone]  Physical Therapy Treatment  Patient Details  Name: Crystal Larsen MRN: [id number] Date of Birth: 29-May-1956 Referring Provider (PT): Rod Can, MD   Encounter Date: 01/25/2021   PT End of Session - 01/25/21 1247    Visit Number 8    Number of Visits 16    Date for PT Re-Evaluation 02/23/21    Authorization Type UHC Medicare    Progress Note Due on Visit 10    PT Start Time 1155    PT Stop Time 1235    PT Time Calculation (min) 40 min    Activity Tolerance Patient tolerated treatment well;Patient limited by fatigue    Behavior During Therapy [facility] for tasks assessed/performed           Past Medical History:  Diagnosis Date  . Acute kidney injury ([place])   . Adrenal insufficiency ([place])   . Anemia   . Anxiety   . Arthritis    "left foot; left hand; right shoulder; back" (11/04/2014)  . Elevated liver enzymes   . Gallstones   . GERD (gastroesophageal reflux disease)   . History of gout   . HTN (hypertension)   . Hypothyroidism   . IBS (irritable bowel syndrome)    ?  [name] Lupus ([place]) 07/2019  . Metabolic acidosis   . Migraine    "maybe once/yr" (11/04/2014)  . Neuropathy    right leg  . Obesity   . Pancreatitis   . Pancreatitis   . PONV (postoperative nausea and vomiting) 1981 X 1   only time  . Seizures ([place]) 2003    due to tramadol reaction -- takes depakote for migraine prevention  . Sleep apnea    does not use cpap  . Spondylolisthesis of lumbar region   . Unsteady gait   . Wears glasses     Past Surgical History:  Procedure Laterality Date  . ANKLE GANGLION CYST EXCISION Right 1980's  . ANTERIOR LAT LUMBAR FUSION N/A 12/09/2019   Procedure: Lumbar One-Two Anterolateral lumbar decompression/interbody fusion with lateral plate;  Surgeon: Kristeen Miss, MD;  Location: [facility];  Service: Neurosurgery;   Laterality: N/A;  Lumbar One-Two Anterolateral lumbar decompression/interbody fusion with lateral plate  . BACK SURGERY    . BREAST SURGERY     biopsy  . BUNIONECTOMY Left 2012  . CARPAL TUNNEL RELEASE Right 05/22/2019   Procedure: RIGHT CARPAL TUNNEL RELEASE;  Surgeon: Daryll Brod, MD;  Location: [place];  Service: Orthopedics;  Laterality: Right;  . CARPOMETACARPEL SUSPENSION PLASTY Right 05/22/2019   Procedure: ARTHROPLASY CARPOMETACARPAL RIGHT THUMB TRAPEZIUM EXCISION TENDON TRANSFER MICRO LINK SUSPENSION;  Surgeon: Daryll Brod, MD;  Location: [place];  Service: Orthopedics;  Laterality: Right;  . CESAREAN SECTION  1986  . COLONOSCOPY W/ BIOPSIES AND POLYPECTOMY    . EYE SURGERY Bilateral 2012   "lasered a hole in my iris cause pressure was building up"  . LAPAROSCOPIC CHOLECYSTECTOMY  1990's  . NASAL SINUS SURGERY  1980's  . POSTERIOR LUMBAR FUSION  05/2013   L4-5; S1  . POSTERIOR LUMBAR FUSION  11/04/2014   L3-4  . SHOULDER ARTHROSCOPY W/ ROTATOR CUFF REPAIR Right 2012  . TUBAL LIGATION  1992    There were no vitals filed for this visit.   Subjective Assessment - 01/25/21 1200    Subjective Pt states she had a tiring weekend for her  daughter's baby shower. Pt does not report soreness but just general fatigue. Pt states this morning she felt she was walking with improved gait pattern.    Pertinent History Lupus, lumbar fusion L2-S1 (4 surgeries)    Limitations Sitting;Walking;Standing    How long can you sit comfortably? recliner helps, if sitting straight 5 min    How long can you stand comfortably? back gets tired, 20 min    How long can you walk comfortably? it's always painful    Diagnostic tests lumbar MRI 2021: mild retrolisthesis L1/2 with some foraminal narrowing    Patient Stated Goals improve pain, will be new grandparents in April and hopes to be involved    Currently in Pain? Yes    Pain Score 3     Pain Location Hip    Pain  Orientation Right    Pain Descriptors / Indicators Aching    Pain Type Chronic pain    Pain Onset More than a month ago                             [facility] Adult PT Treatment/Exercise - 01/25/21 0001      Knee/Hip Exercises: Aerobic   Nustep 7 min lvl 4      Knee/Hip Exercises: Machines for Strengthening   Other Machine Shuttle 87#: double leg + ball squeeze 2x10; R & then L single leg 10x5 sec hold;      Knee/Hip Exercises: Standing   Hip Abduction Stengthening;Both;Knee straight;2 sets;5 reps    Abduction Limitations modified prone    Hip Extension Stengthening;10 reps;Knee straight;2 sets;Right;Left    Extension Limitations modified prone on table      Manual Therapy   Manual Therapy Joint mobilization    Joint Mobilization Bilat hip grade III inferior and lateral mobilization                    PT Short Term Goals - 01/20/21 1252      PT SHORT TERM GOAL #1   Title Pt will be ind with initial HEP    Time 3    Period Weeks    Status Achieved    Target Date 01/19/21      PT SHORT TERM GOAL #2   Title Pt will demo improved gait pattern to include improved weight shifting into Rt LE and reduce degree of Trendelenburg bil secondary to improved gluteal use in closed chain.    Time 4    Period Weeks    Status Partially Met    Target Date 01/26/21      PT SHORT TERM GOAL #3   Title Pt will report reduced pain with household ambulation by at least 30%    Time 4    Period Weeks    Status On-going    Target Date 01/26/21      PT SHORT TERM GOAL #4   Title Pt will be able to sit in upright chair (vs recliner) for at least 15 mintues for mealtime with at least 30% less pain.    Status On-going      PT SHORT TERM GOAL #5   Title Pt will demo 5x sit to stand without UE use in </= 13 sec    Baseline 15 sec, use of UEs    Time 4    Period Weeks    Status On-going    Target Date 01/26/21  PT Long Term Goals - 12/29/20 1303       PT LONG TERM GOAL #1   Title Pt will report improved endurance for light ADLs and household ambulation for up to 20 min before needing seated break.    Time 8    Period Weeks    Status New    Target Date 02/23/21      PT LONG TERM GOAL #2   Title Patient able to safely amb community distances using cane.    Time 8    Period Weeks    Status New    Target Date 02/23/21      PT LONG TERM GOAL #3   Title Improved Bil hip strength to >= 4+/5 to improve overall function    Baseline bil hips 4/5    Time 8    Period Weeks    Status New    Target Date 02/23/21      PT LONG TERM GOAL #4   Title Improved 5x sit to stand to </= 11 sec to reduce fall risk    Baseline 15 sec    Time 8    Period Weeks    Status New    Target Date 02/23/21      PT LONG TERM GOAL #5   Title Improved FOTO to >/= 50% to demo improved function    Baseline 33%    Time 8    Period Weeks    Status New    Target Date 02/23/21                 Plan - 01/25/21 1245    Clinical Impression Statement Pt demos decreased trendlenburg pattern during gait this session. Treatment focused on continuing to progress pt's standing exercise into single leg stance without pain or compensation. Continued to strengthen core, hip extensors and abductors. Increased weight on shuttle. Provided manual therapy to mobilize pt's tight bilat hip joints -- pt is demonstrating increased LE muscle flexibility bilat.    Personal Factors and Comorbidities Comorbidity 1;Comorbidity 2;Time since onset of injury/illness/exacerbation    Comorbidities lumbar fusion L2-S1, Lupus    Examination-Activity Limitations Locomotion Level;Sit;Lift;Stairs;Stand;Bend    Examination-Participation Restrictions Meal Prep;Laundry;Shop;Community Activity;Cleaning    Stability/Clinical Decision Making Stable/Uncomplicated    Rehab Potential Good    PT Frequency 2x / week    PT Duration 8 weeks    PT Treatment/Interventions ADLs/Self Care Home  Management;Cryotherapy;Electrical Stimulation;Moist Heat;Iontophoresis 25m/ml Dexamethasone;Gait training;Stair training;Functional mobility training;Therapeutic exercise;Therapeutic activities;Neuromuscular re-education;Balance training;Patient/family education;Energy conservation;Passive range of motion;Manual techniques;Taping;Spinal Manipulations;Joint Manipulations    PT Next Visit Plan Re test 5xSTS. NuStep, gait activities for closed chain weight shifting, core and hip strength as tol (continue standing hip abduction/upright skating, abductor machines, leg press). Increase adductor strengthening!    PT Home Exercise Plan Access Code: [email or web address]   Consulted and Agree with Plan of Care Patient           Patient will benefit from skilled therapeutic intervention in order to improve the following deficits and impairments:  Abnormal gait,Decreased range of motion,Difficulty walking,Decreased endurance,Pain,Decreased activity tolerance,Impaired flexibility,Decreased balance,Decreased mobility,Decreased strength  Visit Diagnosis: Pain in right hip  Chronic bilateral low back pain, unspecified whether sciatica present  Muscle weakness (generalized)  Unsteadiness on feet     Problem List Patient Active Problem List   Diagnosis Date Noted  . Hypokalemia 01/12/2021  . Encounter for antineoplastic chemotherapy 11/18/2020  . [place] 11/03/2020  . [place] 11/03/2020  . Proteinuria  11/03/2020  . Other spondylosis with radiculopathy, lumbar region 12/09/2019  . Secondary adrenal insufficiency ([place]) 04/03/2019  . Metabolic acidosis, normal anion gap (NAG) 04/02/2019  . HTN (hypertension)   . Adrenal insufficiency ([place]) 04/01/2019  . Hyponatremia 04/01/2019  . Hypothyroidism 04/01/2019  . AKI (acute kidney injury) ([place]) 04/01/2019  . Lumbar stenosis with neurogenic claudication 02/19/2017  . Migraine with aura and without status migrainosus, not  intractable 07/11/2016  . Spondylolisthesis of lumbar region 11/04/2014  . Postconcussional syndrome 09/30/2013  . Post-concussion headache 09/30/2013  . Esophageal reflux 10/11/2011  . Benign neoplasm of colon 10/11/2011  . Gastritis, chronic 10/11/2011  . Guaiac + stool 09/28/2011  . IBS (irritable bowel syndrome) 09/28/2011  . GERD (gastroesophageal reflux disease) 09/28/2011  . Migraine syndrome 09/28/2011    [facility] April Ma L Sebrena Engh PT, DPT 01/25/2021, 12:50 PM  [facility] [address] [place], [place], [address] Phone: [phone]   Fax:  [phone]  Name: Crystal Larsen MRN: [id number] Date of Birth: [birth year]

## 2021-01-27 ENCOUNTER — Other Ambulatory Visit: Payer: Self-pay

## 2021-01-27 ENCOUNTER — Ambulatory Visit (HOSPITAL_BASED_OUTPATIENT_CLINIC_OR_DEPARTMENT_OTHER): Payer: Medicare Other | Admitting: Physical Therapy

## 2021-01-27 DIAGNOSIS — R2681 Unsteadiness on feet: Secondary | ICD-10-CM

## 2021-01-27 DIAGNOSIS — G8929 Other chronic pain: Secondary | ICD-10-CM

## 2021-01-27 DIAGNOSIS — M25551 Pain in right hip: Secondary | ICD-10-CM | POA: Diagnosis not present

## 2021-01-27 DIAGNOSIS — M6281 Muscle weakness (generalized): Secondary | ICD-10-CM

## 2021-01-27 NOTE — Therapy (Signed)
[place], [place], [address] Phone: [phone]   Fax:  [phone]  Physical Therapy Treatment  Patient Details  Name: Crystal Larsen MRN: [id number] Date of Birth: 09/23/1956 Referring Provider (PT): Rod Can, MD   Encounter Date: 01/27/2021   PT End of Session - 01/27/21 1246    Visit Number 9    Number of Visits 16    Date for PT Re-Evaluation 02/23/21    Authorization Type UHC Medicare    Progress Note Due on Visit 10    PT Start Time 1145    PT Stop Time 1235    PT Time Calculation (min) 50 min    Activity Tolerance Patient tolerated treatment well;Patient limited by fatigue    Behavior During Therapy [facility] for tasks assessed/performed           Past Medical History:  Diagnosis Date  . Acute kidney injury ([place])   . Adrenal insufficiency ([place])   . Anemia   . Anxiety   . Arthritis    "left foot; left hand; right shoulder; back" (11/04/2014)  . Elevated liver enzymes   . Gallstones   . GERD (gastroesophageal reflux disease)   . History of gout   . HTN (hypertension)   . Hypothyroidism   . IBS (irritable bowel syndrome)    ?  [name] Lupus ([place]) 07/2019  . Metabolic acidosis   . Migraine    "maybe once/yr" (11/04/2014)  . Neuropathy    right leg  . Obesity   . Pancreatitis   . Pancreatitis   . PONV (postoperative nausea and vomiting) 1981 X 1   only time  . Seizures ([place]) 2003    due to tramadol reaction -- takes depakote for migraine prevention  . Sleep apnea    does not use cpap  . Spondylolisthesis of lumbar region   . Unsteady gait   . Wears glasses     Past Surgical History:  Procedure Laterality Date  . ANKLE GANGLION CYST EXCISION Right 1980's  . ANTERIOR LAT LUMBAR FUSION N/A 12/09/2019   Procedure: Lumbar One-Two Anterolateral lumbar decompression/interbody fusion with lateral plate;  Surgeon: Kristeen Miss, MD;  Location: [place];  Service: Neurosurgery;   Laterality: N/A;  Lumbar One-Two Anterolateral lumbar decompression/interbody fusion with lateral plate  . BACK SURGERY    . BREAST SURGERY     biopsy  . BUNIONECTOMY Left 2012  . CARPAL TUNNEL RELEASE Right 05/22/2019   Procedure: RIGHT CARPAL TUNNEL RELEASE;  Surgeon: Daryll Brod, MD;  Location: [place];  Service: Orthopedics;  Laterality: Right;  . CARPOMETACARPEL SUSPENSION PLASTY Right 05/22/2019   Procedure: ARTHROPLASY CARPOMETACARPAL RIGHT THUMB TRAPEZIUM EXCISION TENDON TRANSFER MICRO LINK SUSPENSION;  Surgeon: Daryll Brod, MD;  Location: [place];  Service: Orthopedics;  Laterality: Right;  . CESAREAN SECTION  1986  . COLONOSCOPY W/ BIOPSIES AND POLYPECTOMY    . EYE SURGERY Bilateral 2012   "lasered a hole in my iris cause pressure was building up"  . LAPAROSCOPIC CHOLECYSTECTOMY  1990's  . NASAL SINUS SURGERY  1980's  . POSTERIOR LUMBAR FUSION  05/2013   L4-5; S1  . POSTERIOR LUMBAR FUSION  11/04/2014   L3-4  . SHOULDER ARTHROSCOPY W/ ROTATOR CUFF REPAIR Right 2012  . TUBAL LIGATION  1992    There were no vitals filed for this visit.  [place] Adult PT Treatment/Exercise - 01/27/21 0001      Ambulation/Gait   Ambulation Distance (Feet) 50 Feet    Assistive device Straight cane    Pre-Gait Activities Focus on forward step without hip drop; cues to decrease bilat foot abduction      Knee/Hip Exercises: Aerobic   Nustep 6 min lvl 6      Knee/Hip Exercises: Machines for Strengthening   Other Machine Shuttle 87#: double leg + ball squeeze 2x10; R & then L single leg x10;      Knee/Hip Exercises: Standing   Other Standing Knee Exercises Weight shifting on fitter 2x10 focusing on decreasing trendelenburg      Knee/Hip Exercises: Supine   Other Supine Knee/Hip Exercises Bridge with eccentric descent on 1 leg 2x5                    PT Short Term Goals - 01/27/21 1244      PT SHORT TERM GOAL  #1   Title Pt will be ind with initial HEP    Time 3    Period Weeks    Status Achieved    Target Date 01/19/21      PT SHORT TERM GOAL #2   Title Pt will demo improved gait pattern to include improved weight shifting into Rt LE and reduce degree of Trendelenburg bil secondary to improved gluteal use in closed chain.    Time 4    Period Weeks    Status Partially Met    Target Date 01/26/21      PT SHORT TERM GOAL #3   Title Pt will report reduced pain with household ambulation by at least 30%    Time 4    Period Weeks    Status On-going    Target Date 01/26/21      PT SHORT TERM GOAL #4   Title Pt will be able to sit in upright chair (vs recliner) for at least 15 mintues for mealtime with at least 30% less pain.    Status On-going      PT SHORT TERM GOAL #5   Title Pt will demo 5x sit to stand without UE use in </= 13 sec    Baseline 15 sec, use of UEs; 9 sec no UEs 01/27/21    Time 4    Period Weeks    Status Achieved    Target Date 01/26/21             PT Long Term Goals - 12/29/20 1303      PT LONG TERM GOAL #1   Title Pt will report improved endurance for light ADLs and household ambulation for up to 20 min before needing seated break.    Time 8    Period Weeks    Status New    Target Date 02/23/21      PT LONG TERM GOAL #2   Title Patient able to safely amb community distances using cane.    Time 8    Period Weeks    Status New    Target Date 02/23/21      PT LONG TERM GOAL #3   Title Improved Bil hip strength to >= 4+/5 to improve overall function    Baseline bil hips 4/5    Time 8    Period Weeks    Status New    Target Date 02/23/21      PT LONG TERM GOAL #4   Title Improved 5x sit to  stand to </= 11 sec to reduce fall risk    Baseline 15 sec    Time 8    Period Weeks    Status New    Target Date 02/23/21      PT LONG TERM GOAL #5   Title Improved FOTO to >/= 50% to demo improved function    Baseline 33%    Time 8    Period Weeks     Status New    Target Date 02/23/21                 Plan - 01/27/21 1249    Clinical Impression Statement Progressed pt bridging exercises and weightshifting on fitter to continue to progress pt into single leg stance without hip drop. Continued to strengthen hips and core. Pt's gait continues to improve. Pt has met 5xSTS STG and LTG.    Personal Factors and Comorbidities Comorbidity 1;Comorbidity 2;Time since onset of injury/illness/exacerbation    Comorbidities lumbar fusion L2-S1, Lupus    Examination-Activity Limitations Locomotion Level;Sit;Lift;Stairs;Stand;Bend    Examination-Participation Restrictions Meal Prep;Laundry;Shop;Community Activity;Cleaning    Stability/Clinical Decision Making Stable/Uncomplicated    Rehab Potential Good    PT Frequency 2x / week    PT Duration 8 weeks    PT Treatment/Interventions ADLs/Self Care Home Management;Cryotherapy;Electrical Stimulation;Moist Heat;Iontophoresis 53m/ml Dexamethasone;Gait training;Stair training;Functional mobility training;Therapeutic exercise;Therapeutic activities;Neuromuscular re-education;Balance training;Patient/family education;Energy conservation;Passive range of motion;Manual techniques;Taping;Spinal Manipulations;Joint Manipulations    PT Next Visit Plan NuStep, gait activities for closed chain weight shifting, core and hip strength as tol (continue standing hip abduction/upright skating, abductor machines, leg press). Increase adductor strengthening. Continue to progress into single leg    PT Home Exercise Plan Access Code: [email or web address]   Consulted and Agree with Plan of Care Patient           Patient will benefit from skilled therapeutic intervention in order to improve the following deficits and impairments:  Abnormal gait,Decreased range of motion,Difficulty walking,Decreased endurance,Pain,Decreased activity tolerance,Impaired flexibility,Decreased balance,Decreased mobility,Decreased strength  Visit  Diagnosis: Pain in right hip  Chronic bilateral low back pain, unspecified whether sciatica present  Muscle weakness (generalized)  Unsteadiness on feet     Problem List Patient Active Problem List   Diagnosis Date Noted  . Hypokalemia 01/12/2021  . Encounter for antineoplastic chemotherapy 11/18/2020  . [place]ic [place]matosus ([place] 11/03/2020  . Lupus nephritis ([place] 11/03/2020  . Proteinuria 11/03/2020  . Other spondylosis with radiculopathy, lumbar region 12/09/2019  . Secondary adrenal insufficiency ([place] 04/03/2019  . Metabolic acidosis, normal anion gap (NAG) 04/02/2019  . HTN (hypertension)   . Adrenal insufficiency ([place] 04/01/2019  . Hyponatremia 04/01/2019  . Hypothyroidism 04/01/2019  . AKI (acute kidney injury) ([place] 04/01/2019  . Lumbar stenosis with neurogenic claudication 02/19/2017  . Migraine with aura and without status migrainosus, not intractable 07/11/2016  . Spondylolisthesis of lumbar region 11/04/2014  . Postconcussional syndrome 09/30/2013  . Post-concussion headache 09/30/2013  . Esophageal reflux 10/11/2011  . Benign neoplasm of colon 10/11/2011  . Gastritis, chronic 10/11/2011  . Guaiac + stool 09/28/2011  . IBS (irritable bowel syndrome) 09/28/2011  . GERD (gastroesophageal reflux disease) 09/28/2011  . Migraine syndrome 09/28/2011    [facility] Ma L Jailynn Lavalais PT, DPT 01/27/2021, 12:53 PM  [facility] [address]Phone: [phone]  Fax:  [phone] Name: Crystal CATALDOMRN: [id number] of Birth: 102/13/1957

## 2021-02-02 ENCOUNTER — Ambulatory Visit (HOSPITAL_BASED_OUTPATIENT_CLINIC_OR_DEPARTMENT_OTHER): Payer: Medicare Other | Admitting: Physical Therapy

## 2021-02-03 ENCOUNTER — Ambulatory Visit (HOSPITAL_BASED_OUTPATIENT_CLINIC_OR_DEPARTMENT_OTHER): Payer: Medicare Other | Admitting: Physical Therapy

## 2021-02-03 ENCOUNTER — Other Ambulatory Visit: Payer: Self-pay

## 2021-02-03 DIAGNOSIS — M25551 Pain in right hip: Secondary | ICD-10-CM | POA: Diagnosis not present

## 2021-02-03 DIAGNOSIS — G8929 Other chronic pain: Secondary | ICD-10-CM

## 2021-02-03 DIAGNOSIS — R2681 Unsteadiness on feet: Secondary | ICD-10-CM

## 2021-02-03 DIAGNOSIS — M6281 Muscle weakness (generalized): Secondary | ICD-10-CM

## 2021-02-03 NOTE — Therapy (Addendum)
[place], [place], [address] Phone: [phone]   Fax:  [phone]  Physical Therapy Treatment and Progress Note  Patient Details  Name: Crystal Larsen MRN: [id number] Date of Birth: [birth year] Referring Provider (PT): Rod Can, MD  Progress Note Reporting Period 12/29/20 to 02/03/21  See note below for Objective Data and Assessment of Progress/Goals.       Encounter Date: 02/03/2021   PT End of Session - 02/03/21 1302    Visit Number 10    Number of Visits 16    Date for PT Re-Evaluation 02/23/21    Authorization Type UHC Medicare    Progress Note Due on Visit 10    PT Start Time 1155    PT Stop Time 1235    PT Time Calculation (min) 40 min    Activity Tolerance Patient tolerated treatment well;Patient limited by fatigue    Behavior During Therapy WFL for tasks assessed/performed           Past Medical History:  Diagnosis Date  . Acute kidney injury ([place])   . Adrenal insufficiency ([place])   . Anemia   . Anxiety   . Arthritis    "left foot; left hand; right shoulder; back" (11/04/2014)  . Elevated liver enzymes   . Gallstones   . GERD (gastroesophageal reflux disease)   . History of gout   . HTN (hypertension)   . Hypothyroidism   . IBS (irritable bowel syndrome)    ?  [name] Lupus ([place]) 07/2019  . Metabolic acidosis   . Migraine    "maybe once/yr" (11/04/2014)  . Neuropathy    right leg  . Obesity   . Pancreatitis   . Pancreatitis   . PONV (postoperative nausea and vomiting) 1981 X 1   only time  . Seizures ([place]) 2003    due to tramadol reaction -- takes depakote for migraine prevention  . Sleep apnea    does not use cpap  . Spondylolisthesis of lumbar region   . Unsteady gait   . Wears glasses     Past Surgical History:  Procedure Laterality Date  . ANKLE GANGLION CYST EXCISION Right 1980's  . ANTERIOR LAT LUMBAR FUSION N/A 12/09/2019   Procedure: Lumbar One-Two  Anterolateral lumbar decompression/interbody fusion with lateral plate;  Surgeon: Kristeen Miss, MD;  Location: [place];  Service: Neurosurgery;  Laterality: N/A;  Lumbar One-Two Anterolateral lumbar decompression/interbody fusion with lateral plate  . BACK SURGERY    . BREAST SURGERY     biopsy  . BUNIONECTOMY Left 2012  . CARPAL TUNNEL RELEASE Right 05/22/2019   Procedure: RIGHT CARPAL TUNNEL RELEASE;  Surgeon: Daryll Brod, MD;  Location: [place];  Service: Orthopedics;  Laterality: Right;  . CARPOMETACARPEL SUSPENSION PLASTY Right 05/22/2019   Procedure: ARTHROPLASY CARPOMETACARPAL RIGHT THUMB TRAPEZIUM EXCISION TENDON TRANSFER MICRO LINK SUSPENSION;  Surgeon: Daryll Brod, MD;  Location: [place];  Service: Orthopedics;  Laterality: Right;  . CESAREAN SECTION  1986  . COLONOSCOPY W/ BIOPSIES AND POLYPECTOMY    . EYE SURGERY Bilateral 2012   "lasered a hole in my iris cause pressure was building up"  . LAPAROSCOPIC CHOLECYSTECTOMY  1990's  . NASAL SINUS SURGERY  1980's  . POSTERIOR LUMBAR FUSION  05/2013   L4-5; S1  . POSTERIOR LUMBAR FUSION  11/04/2014   L3-4  . SHOULDER ARTHROSCOPY W/ ROTATOR CUFF REPAIR Right 2012  . TUBAL LIGATION  1992    There were  no vitals filed for this visit.   Subjective Assessment - 02/03/21 1159    Subjective Pt states she was able to go 3 days to the [facility] tournament.    Pertinent History Lupus, lumbar fusion L2-S1 (4 surgeries)    Limitations Sitting;Walking;Standing    How long can you sit comfortably? recliner helps, if sitting straight 5 min    How long can you stand comfortably? back gets tired, 20 min    How long can you walk comfortably? it's always painful    Diagnostic tests lumbar MRI 2021: mild retrolisthesis L1/2 with some foraminal narrowing    Patient Stated Goals improve pain, will be new grandparents in April and hopes to be involved    Currently in Pain? Yes    Pain Score 2     Pain Location Hip    Pain  Onset More than a month ago                             [facility] Adult PT Treatment/Exercise - 02/03/21 0001      Ambulation/Gait   Ambulation Distance (Feet) 25 Feet    Assistive device None    Gait Pattern Step-through pattern;Abducted- right;Trendelenburg;Lateral hip instability;Lateral trunk lean to right;Lateral trunk lean to left      Knee/Hip Exercises: Aerobic   Nustep 6 min Lvl 5      Knee/Hip Exercises: Standing   Other Standing Knee Exercises Lateral band walk 2x10 with green tband; backwards walking with orange tband around knees    Other Standing Knee Exercises On foam: EO & then EC feet together x30 sec, with head nods & head turns x30 sec each. One foot on foam x30 sec each (attempted foot forward and lateral step)                    PT Short Term Goals - 01/27/21 1244      PT SHORT TERM GOAL #1   Title Pt will be ind with initial HEP    Time 3    Period Weeks    Status Achieved    Target Date 01/19/21      PT SHORT TERM GOAL #2   Title Pt will demo improved gait pattern to include improved weight shifting into Rt LE and reduce degree of Trendelenburg bil secondary to improved gluteal use in closed chain.    Time 4    Period Weeks    Status Partially Met    Target Date 01/26/21      PT SHORT TERM GOAL #3   Title Pt will report reduced pain with household ambulation by at least 30%    Time 4    Period Weeks    Status On-going    Target Date 01/26/21      PT SHORT TERM GOAL #4   Title Pt will be able to sit in upright chair (vs recliner) for at least 15 mintues for mealtime with at least 30% less pain.    Status On-going      PT SHORT TERM GOAL #5   Title Pt will demo 5x sit to stand without UE use in </= 13 sec    Baseline 15 sec, use of UEs; 9 sec no UEs 01/27/21    Time 4    Period Weeks    Status Achieved    Target Date 01/26/21             PT  Long Term Goals - 12/29/20 1303      PT LONG TERM GOAL #1   Title Pt  will report improved endurance for light ADLs and household ambulation for up to 20 min before needing seated break.    Time 8    Period Weeks    Status New    Target Date 02/23/21      PT LONG TERM GOAL #2   Title Patient able to safely amb community distances using cane.    Time 8    Period Weeks    Status New    Target Date 02/23/21      PT LONG TERM GOAL #3   Title Improved Bil hip strength to >= 4+/5 to improve overall function    Baseline bil hips 4/5    Time 8    Period Weeks    Status New    Target Date 02/23/21      PT LONG TERM GOAL #4   Title Improved 5x sit to stand to </= 11 sec to reduce fall risk    Baseline 15 sec    Time 8    Period Weeks    Status New    Target Date 02/23/21      PT LONG TERM GOAL #5   Title Improved FOTO to >/= 50% to demo improved function    Baseline 33%    Time 8    Period Weeks    Status New    Target Date 02/23/21                 Plan - 02/03/21 1257    Clinical Impression Statement Pt able to demonstrate amb without a/d this session for short distance without pain. Pt's gait continues to improve with cane. Progressed pt's standing band exercises this session. Continued to improve patient's balance and stability.    Personal Factors and Comorbidities Comorbidity 1;Comorbidity 2;Time since onset of injury/illness/exacerbation    Comorbidities lumbar fusion L2-S1, Lupus    Examination-Activity Limitations Locomotion Level;Sit;Lift;Stairs;Stand;Bend    Examination-Participation Restrictions Meal Prep;Laundry;Shop;Community Activity;Cleaning    Stability/Clinical Decision Making Stable/Uncomplicated    Rehab Potential Good    PT Frequency 2x / week    PT Duration 8 weeks    PT Treatment/Interventions ADLs/Self Care Home Management;Cryotherapy;Electrical Stimulation;Moist Heat;Iontophoresis 4mg /ml Dexamethasone;Gait training;Stair training;Functional mobility training;Therapeutic exercise;Therapeutic activities;Neuromuscular  re-education;Balance training;Patient/family education;Energy conservation;Passive range of motion;Manual techniques;Taping;Spinal Manipulations;Joint Manipulations    PT Next Visit Plan NuStep, gait activities for closed chain weight shifting, core and hip strength as tol (continue standing hip abduction/upright skating, abductor machines, leg press). Increase adductor strengthening. Continue to progress into single leg with foot on step exercises.    PT Home Exercise Plan Access Code: [email or web address]    Consulted and Agree with Plan of Care Patient           Patient will benefit from skilled therapeutic intervention in order to improve the following deficits and impairments:  Abnormal gait,Decreased range of motion,Difficulty walking,Decreased endurance,Pain,Decreased activity tolerance,Impaired flexibility,Decreased balance,Decreased mobility,Decreased strength  Visit Diagnosis: Pain in right hip  Chronic bilateral low back pain, unspecified whether sciatica present  Muscle weakness (generalized)  Unsteadiness on feet     Problem List Patient Active Problem List   Diagnosis Date Noted  . Hypokalemia 01/12/2021  . Encounter for antineoplastic chemotherapy 11/18/2020  . Systemic lupus erythematosus ([place]) 11/03/2020  . Lupus nephritis ([place]) 11/03/2020  . Proteinuria 11/03/2020  . Other spondylosis with radiculopathy, lumbar region 12/09/2019  . Secondary adrenal insufficiency (  [place]) 04/03/2019  . Metabolic acidosis, normal anion gap (NAG) 04/02/2019  . HTN (hypertension)   . Adrenal insufficiency ([place]) 04/01/2019  . Hyponatremia 04/01/2019  . Hypothyroidism 04/01/2019  . AKI (acute kidney injury) ([place]) 04/01/2019  . Lumbar stenosis with neurogenic claudication 02/19/2017  . Migraine with aura and without status migrainosus, not intractable 07/11/2016  . Spondylolisthesis of lumbar region 11/04/2014  . Postconcussional syndrome 09/30/2013  . Post-concussion headache 09/30/2013  .  Esophageal reflux 10/11/2011  . Benign neoplasm of colon 10/11/2011  . Gastritis, chronic 10/11/2011  . Guaiac + stool 09/28/2011  . IBS (irritable bowel syndrome) 09/28/2011  . GERD (gastroesophageal reflux disease) 09/28/2011  . Migraine syndrome 09/28/2011    [facility] [address] PT, DPT 02/03/2021, 1:03 PM  [facility] [address] [place], [place], [address] Phone: [phone]   Fax:  [phone]  Name: MYEISHA KRUSER MRN: [id number] Date of Birth: Dec 23, 1955

## 2021-02-08 ENCOUNTER — Ambulatory Visit (HOSPITAL_BASED_OUTPATIENT_CLINIC_OR_DEPARTMENT_OTHER): Payer: Medicare Other | Admitting: Physical Therapy

## 2021-02-08 ENCOUNTER — Other Ambulatory Visit: Payer: Self-pay

## 2021-02-08 DIAGNOSIS — M6281 Muscle weakness (generalized): Secondary | ICD-10-CM

## 2021-02-08 DIAGNOSIS — M25551 Pain in right hip: Secondary | ICD-10-CM

## 2021-02-08 DIAGNOSIS — M545 Low back pain, unspecified: Secondary | ICD-10-CM

## 2021-02-08 DIAGNOSIS — G8929 Other chronic pain: Secondary | ICD-10-CM

## 2021-02-08 DIAGNOSIS — R2681 Unsteadiness on feet: Secondary | ICD-10-CM

## 2021-02-08 NOTE — Therapy (Signed)
[facility] [address] [place], [place], [address] Phone: [phone]   Fax:  [phone]  Physical Therapy Treatment  Patient Details  Name: Crystal Larsen MRN: [id number] Date of Birth: November 02, 1956 Referring Provider (PT): Rod Can, MD   Encounter Date: 02/08/2021   PT End of Session - 02/08/21 1156    Visit Number 11    Number of Visits 16    Date for PT Re-Evaluation 02/23/21    Authorization Type UHC Medicare    Progress Note Due on Visit 10    PT Start Time 1152    PT Stop Time 1235    PT Time Calculation (min) 43 min    Activity Tolerance Patient tolerated treatment well;Patient limited by fatigue    Behavior During Therapy [facility] for tasks assessed/performed           Past Medical History:  Diagnosis Date  . Acute kidney injury ([place])   . Adrenal insufficiency ([place])   . Anemia   . Anxiety   . Arthritis    "left foot; left hand; right shoulder; back" (11/04/2014)  . Elevated liver enzymes   . Gallstones   . GERD (gastroesophageal reflux disease)   . History of gout   . HTN (hypertension)   . Hypothyroidism   . IBS (irritable bowel syndrome)    ?  [name] Lupus ([place]) 07/2019  . Metabolic acidosis   . Migraine    "maybe once/yr" (11/04/2014)  . Neuropathy    right leg  . Obesity   . Pancreatitis   . Pancreatitis   . PONV (postoperative nausea and vomiting) 1981 X 1   only time  . Seizures ([place]) 2003    due to tramadol reaction -- takes depakote for migraine prevention  . Sleep apnea    does not use cpap  . Spondylolisthesis of lumbar region   . Unsteady gait   . Wears glasses     Past Surgical History:  Procedure Laterality Date  . ANKLE GANGLION CYST EXCISION Right 1980's  . ANTERIOR LAT LUMBAR FUSION N/A 12/09/2019   Procedure: Lumbar One-Two Anterolateral lumbar decompression/interbody fusion with lateral plate;  Surgeon: Kristeen Miss, MD;  Location: [place];  Service: Neurosurgery;   Laterality: N/A;  Lumbar One-Two Anterolateral lumbar decompression/interbody fusion with lateral plate  . BACK SURGERY    . BREAST SURGERY     biopsy  . BUNIONECTOMY Left 2012  . CARPAL TUNNEL RELEASE Right 05/22/2019   Procedure: RIGHT CARPAL TUNNEL RELEASE;  Surgeon: Daryll Brod, MD;  Location: [place];  Service: Orthopedics;  Laterality: Right;  . CARPOMETACARPEL SUSPENSION PLASTY Right 05/22/2019   Procedure: ARTHROPLASY CARPOMETACARPAL RIGHT THUMB TRAPEZIUM EXCISION TENDON TRANSFER MICRO LINK SUSPENSION;  Surgeon: Daryll Brod, MD;  Location: [place];  Service: Orthopedics;  Laterality: Right;  . CESAREAN SECTION  1986  . COLONOSCOPY W/ BIOPSIES AND POLYPECTOMY    . EYE SURGERY Bilateral 2012   "lasered a hole in my iris cause pressure was building up"  . LAPAROSCOPIC CHOLECYSTECTOMY  1990's  . NASAL SINUS SURGERY  1980's  . POSTERIOR LUMBAR FUSION  05/2013   L4-5; S1  . POSTERIOR LUMBAR FUSION  11/04/2014   L3-4  . SHOULDER ARTHROSCOPY W/ ROTATOR CUFF REPAIR Right 2012  . TUBAL LIGATION  1992    There were no vitals filed for this visit.   Subjective Assessment - 02/08/21 1241    Subjective Pt reports no new complaints. Pt reports difficulty with  her monster walk exercises.    Pertinent History Lupus, lumbar fusion L2-S1 (4 surgeries)    Limitations Sitting;Walking;Standing    How long can you sit comfortably? recliner helps, if sitting straight 5 min    How long can you stand comfortably? back gets tired, 20 min    How long can you walk comfortably? it's always painful    Diagnostic tests lumbar MRI 2021: mild retrolisthesis L1/2 with some foraminal narrowing    Patient Stated Goals improve pain, will be new grandparents in April and hopes to be involved    Currently in Pain? Yes    Pain Score 2     Pain Location Hip    Pain Orientation Right    Pain Descriptors / Indicators Aching    Pain Type Chronic pain    Pain Onset More than a month  ago                             [facility] Adult PT Treatment/Exercise - 02/08/21 0001      Knee/Hip Exercises: Aerobic   Nustep 6 min Lvl 5      Knee/Hip Exercises: Standing   Hip Extension Stengthening;10 reps;2 sets;Right;Left;Knee bent    Lateral Step Up Both;2 sets;10 reps;Hand Hold: 2;Hand Hold: 1   5 reps without hand hold   Other Standing Knee Exercises Lateral band walk 2x10 with green tband; backwards walking with orange tband around knees                    PT Short Term Goals - 02/08/21 1241      PT SHORT TERM GOAL #1   Title Pt will be ind with initial HEP    Time 3    Period Weeks    Status Achieved    Target Date 01/19/21      PT SHORT TERM GOAL #2   Title Pt will demo improved gait pattern to include improved weight shifting into Rt LE and reduce degree of Trendelenburg bil secondary to improved gluteal use in closed chain.    Time 4    Period Weeks    Status Partially Met    Target Date 01/26/21      PT SHORT TERM GOAL #3   Title Pt will report reduced pain with household ambulation by at least 30%    Time 4    Period Weeks    Status Achieved    Target Date 01/26/21      PT SHORT TERM GOAL #4   Title Pt will be able to sit in upright chair (vs recliner) for at least 15 mintues for mealtime with at least 30% less pain.    Baseline Was achieved but in the last week or so she has felt it again    Status On-going      PT SHORT TERM GOAL #5   Title Pt will demo 5x sit to stand without UE use in </= 13 sec    Baseline 15 sec, use of UEs; 9 sec no UEs 01/27/21    Time 4    Period Weeks    Status Achieved    Target Date 01/26/21             PT Long Term Goals - 12/29/20 1303      PT LONG TERM GOAL #1   Title Pt will report improved endurance for light ADLs and household ambulation for up to 20 min  before needing seated break.    Time 8    Period Weeks    Status New    Target Date 02/23/21      PT LONG TERM GOAL #2    Title Patient able to safely amb community distances using cane.    Time 8    Period Weeks    Status New    Target Date 02/23/21      PT LONG TERM GOAL #3   Title Improved Bil hip strength to >= 4+/5 to improve overall function    Baseline bil hips 4/5    Time 8    Period Weeks    Status New    Target Date 02/23/21      PT LONG TERM GOAL #4   Title Improved 5x sit to stand to </= 11 sec to reduce fall risk    Baseline 15 sec    Time 8    Period Weeks    Status New    Target Date 02/23/21      PT LONG TERM GOAL #5   Title Improved FOTO to >/= 50% to demo improved function    Baseline 33%    Time 8    Period Weeks    Status New    Target Date 02/23/21                 Plan - 02/08/21 1242    Clinical Impression Statement Session focused on progressing pt's HEP. Pt able to demo decreased pelvic drop with single leg step up laterally using 1 UE support. Difficulty with control on step down without any UE support.    Personal Factors and Comorbidities Comorbidity 1;Comorbidity 2;Time since onset of injury/illness/exacerbation    Comorbidities lumbar fusion L2-S1, Lupus    Examination-Activity Limitations Locomotion Level;Sit;Lift;Stairs;Stand;Bend    Examination-Participation Restrictions Meal Prep;Laundry;Shop;Community Activity;Cleaning    Stability/Clinical Decision Making Stable/Uncomplicated    Rehab Potential Good    PT Frequency 2x / week    PT Duration 8 weeks    PT Treatment/Interventions ADLs/Self Care Home Management;Cryotherapy;Electrical Stimulation;Moist Heat;Iontophoresis 36m/ml Dexamethasone;Gait training;Stair training;Functional mobility training;Therapeutic exercise;Therapeutic activities;Neuromuscular re-education;Balance training;Patient/family education;Energy conservation;Passive range of motion;Manual techniques;Taping;Spinal Manipulations;Joint Manipulations    PT Next Visit Plan NuStep, gait activities for closed chain weight shifting, core and  hip strength as tol (continue standing hip abduction/upright skating, abductor machines, leg press). Consider adductor strengthening with wall squats. Continue to progress into single leg with foot on step exercises.    PT Home Exercise Plan Access Code: [email or web address]   Consulted and Agree with Plan of Care Patient           Patient will benefit from skilled therapeutic intervention in order to improve the following deficits and impairments:  Abnormal gait,Decreased range of motion,Difficulty walking,Decreased endurance,Pain,Decreased activity tolerance,Impaired flexibility,Decreased balance,Decreased mobility,Decreased strength  Visit Diagnosis: Pain in right hip  Chronic bilateral low back pain, unspecified whether sciatica present  Muscle weakness (generalized)  Unsteadiness on feet     Problem List Patient Active Problem List   Diagnosis Date Noted  . Hypokalemia 01/12/2021  . Encounter for antineoplastic chemotherapy 11/18/2020  . Systemic lupus erythematosus ([place] 11/03/2020  . Lupus nephritis ([place] 11/03/2020  . Proteinuria 11/03/2020  . Other spondylosis with radiculopathy, lumbar region 12/09/2019  . Secondary adrenal insufficiency ([place] 04/03/2019  . Metabolic acidosis, normal anion gap (NAG) 04/02/2019  . HTN (hypertension)   . Adrenal insufficiency ([place] 04/01/2019  . Hyponatremia 04/01/2019  . Hypothyroidism 04/01/2019  . AKI (acute  kidney injury) ([place]) 04/01/2019  . Lumbar stenosis with neurogenic claudication 02/19/2017  . Migraine with aura and without status migrainosus, not intractable 07/11/2016  . Spondylolisthesis of lumbar region 11/04/2014  . Postconcussional syndrome 09/30/2013  . Post-concussion headache 09/30/2013  . Esophageal reflux 10/11/2011  . Benign neoplasm of colon 10/11/2011  . Gastritis, chronic 10/11/2011  . Guaiac + stool 09/28/2011  . IBS (irritable bowel syndrome) 09/28/2011  . GERD (gastroesophageal reflux disease) 09/28/2011  .  Migraine syndrome 09/28/2011    [facility] [address] PT, DPT 02/08/2021, 12:45 PM  [facility] [address] [place], [place], [address] Phone: [phone]   Fax:  [phone]  Name: MARIYANNA MUCHA MRN: [id number] Date of Birth: 1956/03/07

## 2021-02-09 IMAGING — RF DG LUMBAR SPINE 2-3V
1 series · 2 of 2 positions shown · non-contrast
Comparison: 11/05/2019 and earlier.

CLINICAL DATA: L1-2 anterolateral lumbar decompression and
interbody fusion. Prior L2 through S1 fusion.

EXAM:
Operative LUMBAR SPINE - 2-3 VIEW

[Series 1: run · 2 of 2 slices shown]
[im 1/2]
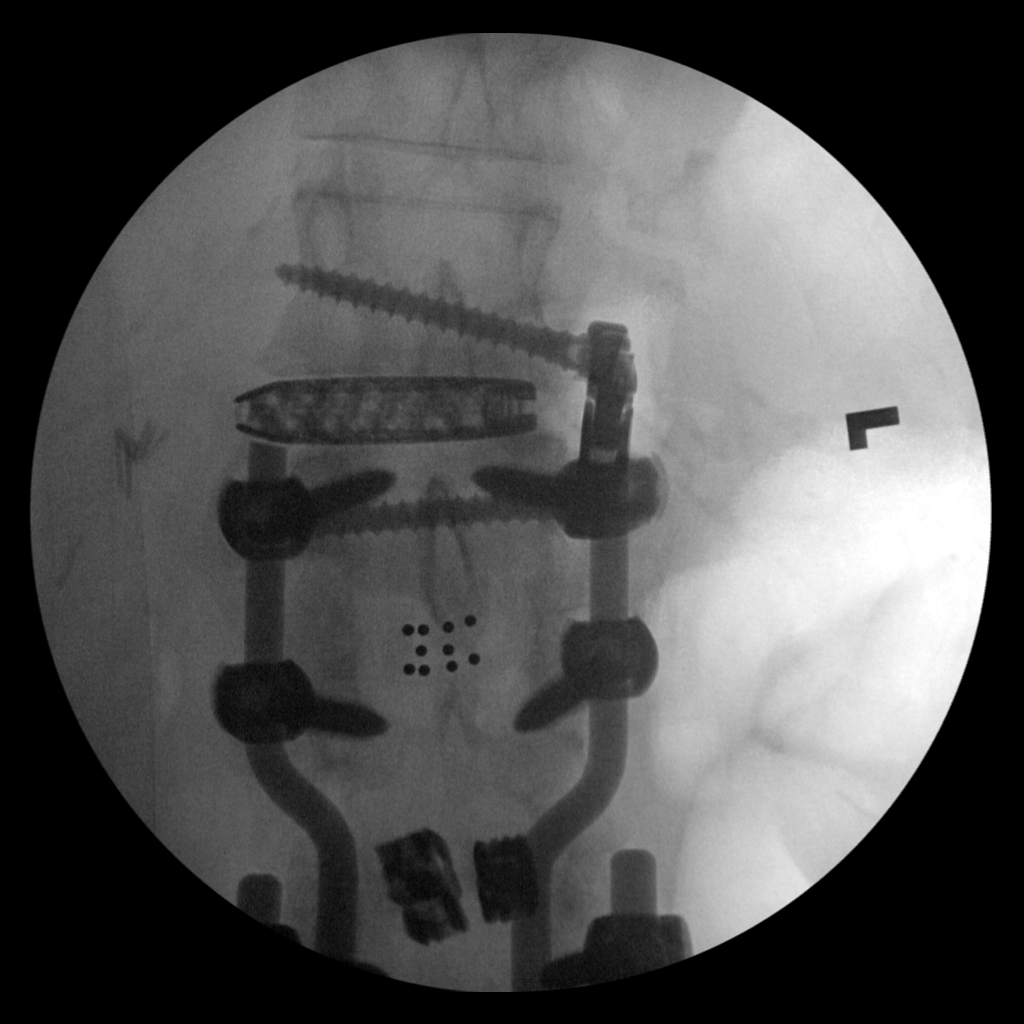
[im 2/2]
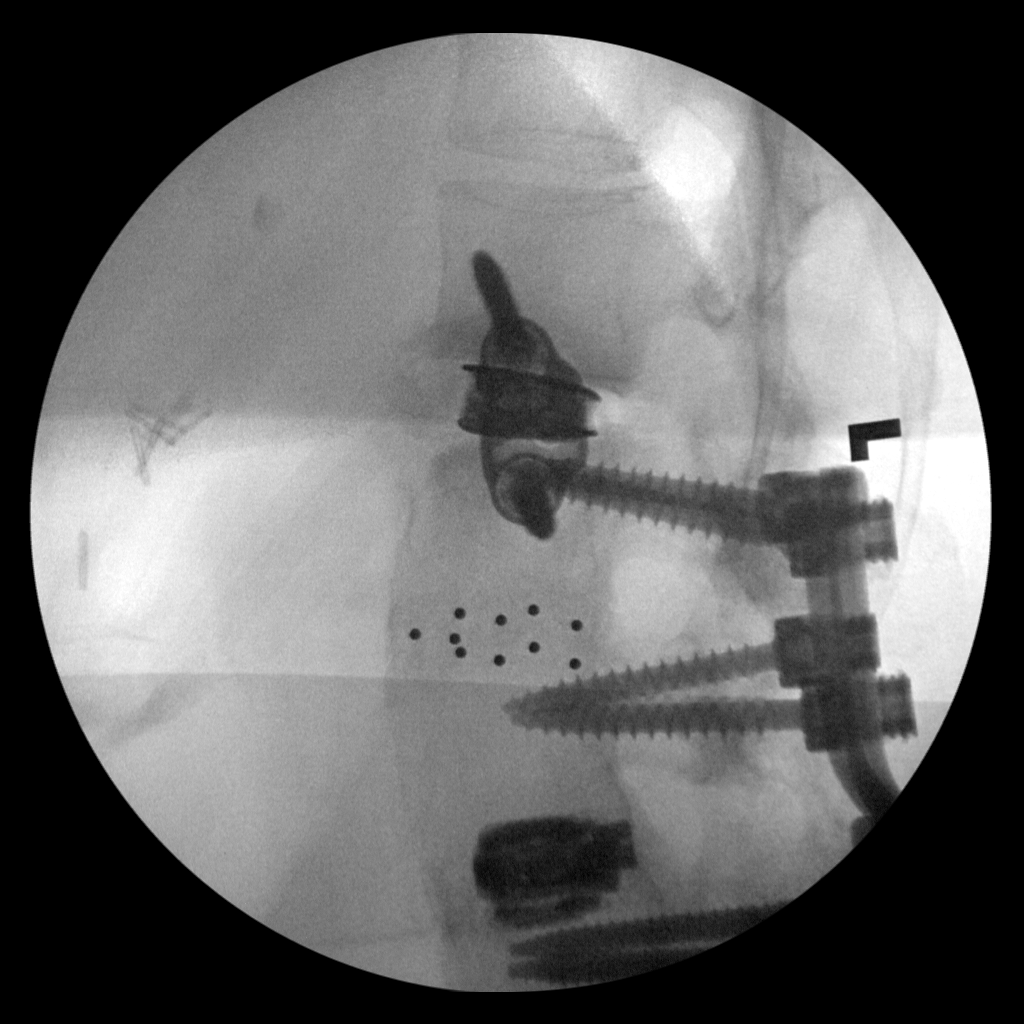

[2 of 2 positions shown; findings below may reference images not displayed]

FINDINGS: Two spot images from the C-arm fluoroscopic device, AP and LATERAL
views of the lumbar spine are submitted for interpretation
postoperatively. LATERAL L1-L2 fusion with hardware and placement of
a metallic disc prosthesis. Alignment appears anatomic on the
LATERAL image.
IMPRESSION: Anatomic alignment post L1-L2 fusion.

## 2021-02-10 ENCOUNTER — Ambulatory Visit (HOSPITAL_BASED_OUTPATIENT_CLINIC_OR_DEPARTMENT_OTHER): Payer: Medicare Other | Admitting: Physical Therapy

## 2021-02-10 ENCOUNTER — Other Ambulatory Visit: Payer: Self-pay

## 2021-02-10 DIAGNOSIS — M6281 Muscle weakness (generalized): Secondary | ICD-10-CM

## 2021-02-10 DIAGNOSIS — M25551 Pain in right hip: Secondary | ICD-10-CM

## 2021-02-10 DIAGNOSIS — R2681 Unsteadiness on feet: Secondary | ICD-10-CM

## 2021-02-10 DIAGNOSIS — M545 Low back pain, unspecified: Secondary | ICD-10-CM

## 2021-02-10 NOTE — Therapy (Signed)
[place], [place], [address] Phone: [phone]   Fax:  [phone]  Physical Therapy Treatment  Patient Details  Name: Crystal Larsen MRN: [id number] Date of Birth: 05-Apr-1956 Referring Provider (PT): Rod Can, MD   Encounter Date: 02/10/2021   PT End of Session - 02/10/21 1153    Visit Number 12    Number of Visits 16    Date for PT Re-Evaluation 02/23/21    Authorization Type UHC Medicare    PT Start Time 1145    PT Stop Time 1230    PT Time Calculation (min) 45 min    Activity Tolerance Patient tolerated treatment well    Behavior During Therapy [facility] for tasks assessed/performed           Past Medical History:  Diagnosis Date  . Acute kidney injury ([place])   . Adrenal insufficiency ([place])   . Anemia   . Anxiety   . Arthritis    "left foot; left hand; right shoulder; back" (11/04/2014)  . Elevated liver enzymes   . Gallstones   . GERD (gastroesophageal reflux disease)   . History of gout   . HTN (hypertension)   . Hypothyroidism   . IBS (irritable bowel syndrome)    ?  [name] Lupus ([place]) 07/2019  . Metabolic acidosis   . Migraine    "maybe once/yr" (11/04/2014)  . Neuropathy    right leg  . Obesity   . Pancreatitis   . Pancreatitis   . PONV (postoperative nausea and vomiting) 1981 X 1   only time  . Seizures ([place]) 2003    due to tramadol reaction -- takes depakote for migraine prevention  . Sleep apnea    does not use cpap  . Spondylolisthesis of lumbar region   . Unsteady gait   . Wears glasses     Past Surgical History:  Procedure Laterality Date  . ANKLE GANGLION CYST EXCISION Right 1980's  . ANTERIOR LAT LUMBAR FUSION N/A 12/09/2019   Procedure: Lumbar One-Two Anterolateral lumbar decompression/interbody fusion with lateral plate;  Surgeon: Kristeen Miss, MD;  Location: [place];  Service: Neurosurgery;  Laterality: N/A;  Lumbar One-Two Anterolateral lumbar  decompression/interbody fusion with lateral plate  . BACK SURGERY    . BREAST SURGERY     biopsy  . BUNIONECTOMY Left 2012  . CARPAL TUNNEL RELEASE Right 05/22/2019   Procedure: RIGHT CARPAL TUNNEL RELEASE;  Surgeon: Daryll Brod, MD;  Location: [facility];  Service: Orthopedics;  Laterality: Right;  . CARPOMETACARPEL SUSPENSION PLASTY Right 05/22/2019   Procedure: ARTHROPLASY CARPOMETACARPAL RIGHT THUMB TRAPEZIUM EXCISION TENDON TRANSFER MICRO LINK SUSPENSION;  Surgeon: Daryll Brod, MD;  Location: [place];  Service: Orthopedics;  Laterality: Right;  . CESAREAN SECTION  1986  . COLONOSCOPY W/ BIOPSIES AND POLYPECTOMY    . EYE SURGERY Bilateral 2012   "lasered a hole in my iris cause pressure was building up"  . LAPAROSCOPIC CHOLECYSTECTOMY  1990's  . NASAL SINUS SURGERY  1980's  . POSTERIOR LUMBAR FUSION  05/2013   L4-5; S1  . POSTERIOR LUMBAR FUSION  11/04/2014   L3-4  . SHOULDER ARTHROSCOPY W/ ROTATOR CUFF REPAIR Right 2012  . TUBAL LIGATION  1992    There were no vitals filed for this visit.   Subjective Assessment - 02/10/21 1149    Subjective Pt states she is feeling some fatigue in her low back.    Pertinent History Lupus, lumbar fusion L2-S1 (4  surgeries)    Limitations Sitting;Walking;Standing    How long can you sit comfortably? recliner helps, if sitting straight 5 min    How long can you stand comfortably? back gets tired, 20 min    How long can you walk comfortably? it's always painful    Diagnostic tests lumbar MRI 2021: mild retrolisthesis L1/2 with some foraminal narrowing    Patient Stated Goals improve pain, will be new grandparents in April and hopes to be involved    Pain Onset More than a month ago                      [facility] Adult PT Treatment/Exercise - 02/10/21 0001      Knee/Hip Exercises: Aerobic   Nustep 6 min Lvl 5      Knee/Hip Exercises: Machines for Strengthening   Other [place] 100#: double leg +  ball squeeze 2x10; R & then L single leg x10;      Knee/Hip Exercises: Standing   Lateral Step Up Both;10 reps;Hand Hold: 1      Knee/Hip Exercises: Seated   Sit to Sand without UE support;10 reps   Eccentrics 2x10; in tandem stance bilat x10 each                   PT Short Term Goals - 02/08/21 1241      PT SHORT TERM GOAL #1   Title Pt will be ind with initial HEP    Time 3    Period Weeks    Status Achieved    Target Date 01/19/21      PT SHORT TERM GOAL #2   Title Pt will demo improved gait pattern to include improved weight shifting into Rt LE and reduce degree of Trendelenburg bil secondary to improved gluteal use in closed chain.    Time 4    Period Weeks    Status Partially Met    Target Date 01/26/21      PT SHORT TERM GOAL #3   Title Pt will report reduced pain with household ambulation by at least 30%    Time 4    Period Weeks    Status Achieved    Target Date 01/26/21      PT SHORT TERM GOAL #4   Title Pt will be able to sit in upright chair (vs recliner) for at least 15 mintues for mealtime with at least 30% less pain.    Baseline Was achieved but in the last week or so she has felt it again    Status On-going      PT SHORT TERM GOAL #5   Title Pt will demo 5x sit to stand without UE use in </= 13 sec    Baseline 15 sec, use of UEs; 9 sec no UEs 01/27/21    Time 4    Period Weeks    Status Achieved    Target Date 01/26/21             PT Long Term Goals - 12/29/20 1303      PT LONG TERM GOAL #1   Title Pt will report improved endurance for light ADLs and household ambulation for up to 20 min before needing seated break.    Time 8    Period Weeks    Status New    Target Date 02/23/21      PT LONG TERM GOAL #2   Title Patient able to safely amb community distances using  cane.    Time 8    Period Weeks    Status New    Target Date 02/23/21      PT LONG TERM GOAL #3   Title Improved Bil hip strength to >= 4+/5 to improve overall  function    Baseline bil hips 4/5    Time 8    Period Weeks    Status New    Target Date 02/23/21      PT LONG TERM GOAL #4   Title Improved 5x sit to stand to </= 11 sec to reduce fall risk    Baseline 15 sec    Time 8    Period Weeks    Status New    Target Date 02/23/21      PT LONG TERM GOAL #5   Title Improved FOTO to >/= 50% to demo improved function    Baseline 33%    Time 8    Period Weeks    Status New    Target Date 02/23/21                 Plan - 02/10/21 1157    Clinical Impression Statement Treatment focused on increasing pt's bilat hip and gross LE strength. Progressed and modified pt's HEP to reflect increase in strength. Continued to focus on progressing pt's single leg strength and stability.    Personal Factors and Comorbidities Comorbidity 1;Comorbidity 2;Time since onset of injury/illness/exacerbation    Comorbidities lumbar fusion L2-S1, Lupus    Examination-Activity Limitations Locomotion Level;Sit;Lift;Stairs;Stand;Bend    Examination-Participation Restrictions Meal Prep;Laundry;Shop;Community Activity;Cleaning    Stability/Clinical Decision Making Stable/Uncomplicated    Rehab Potential Good    PT Frequency 2x / week    PT Duration 8 weeks    PT Treatment/Interventions ADLs/Self Care Home Management;Cryotherapy;Electrical Stimulation;Moist Heat;Iontophoresis 4mg /ml Dexamethasone;Gait training;Stair training;Functional mobility training;Therapeutic exercise;Therapeutic activities;Neuromuscular re-education;Balance training;Patient/family education;Energy conservation;Passive range of motion;Manual techniques;Taping;Spinal Manipulations;Joint Manipulations    PT Next Visit Plan NuStep, gait activities for closed chain weight shifting, core and hip strength as tol (continue standing hip abduction/upright skating, abductor machines, leg press). Progress eccentric squats as able. Continue to progress single leg strength and stability.    PT Home Exercise  Plan Access Code: [email or web address]    Consulted and Agree with Plan of Care Patient           Patient will benefit from skilled therapeutic intervention in order to improve the following deficits and impairments:  Abnormal gait,Decreased range of motion,Difficulty walking,Decreased endurance,Pain,Decreased activity tolerance,Impaired flexibility,Decreased balance,Decreased mobility,Decreased strength  Visit Diagnosis: Pain in right hip  Chronic bilateral low back pain, unspecified whether sciatica present  Muscle weakness (generalized)  Unsteadiness on feet     Problem List Patient Active Problem List   Diagnosis Date Noted  . Hypokalemia 01/12/2021  . Encounter for antineoplastic chemotherapy 11/18/2020  . Systemic lupus erythematosus ([place]) 11/03/2020  . Lupus nephritis ([place]) 11/03/2020  . Proteinuria 11/03/2020  . Other spondylosis with radiculopathy, lumbar region 12/09/2019  . Secondary adrenal insufficiency ([place]) 04/03/2019  . Metabolic acidosis, normal anion gap (NAG) 04/02/2019  . HTN (hypertension)   . Adrenal insufficiency ([place]) 04/01/2019  . Hyponatremia 04/01/2019  . Hypothyroidism 04/01/2019  . AKI (acute kidney injury) ([place]) 04/01/2019  . Lumbar stenosis with neurogenic claudication 02/19/2017  . Migraine with aura and without status migrainosus, not intractable 07/11/2016  . Spondylolisthesis of lumbar region 11/04/2014  . Postconcussional syndrome 09/30/2013  . Post-concussion headache 09/30/2013  . Esophageal reflux 10/11/2011  . Benign neoplasm of  colon 10/11/2011  . Gastritis, chronic 10/11/2011  . Guaiac + stool 09/28/2011  . IBS (irritable bowel syndrome) 09/28/2011  . GERD (gastroesophageal reflux disease) 09/28/2011  . Migraine syndrome 09/28/2011    [facility] [address] PT, DPT 02/10/2021, 12:45 PM  [facility] [address] [place], [place], [address] Phone: [phone]   Fax:   [phone]  Name: JALIYAH FOTHERINGHAM MRN: [id number] Date of Birth: 01-Nov-1956

## 2021-02-15 ENCOUNTER — Other Ambulatory Visit: Payer: Self-pay

## 2021-02-15 ENCOUNTER — Ambulatory Visit (HOSPITAL_BASED_OUTPATIENT_CLINIC_OR_DEPARTMENT_OTHER): Payer: Medicare Other | Admitting: Physical Therapy

## 2021-02-15 DIAGNOSIS — M545 Low back pain, unspecified: Secondary | ICD-10-CM

## 2021-02-15 DIAGNOSIS — M25551 Pain in right hip: Secondary | ICD-10-CM

## 2021-02-15 DIAGNOSIS — M6281 Muscle weakness (generalized): Secondary | ICD-10-CM

## 2021-02-15 DIAGNOSIS — G8929 Other chronic pain: Secondary | ICD-10-CM

## 2021-02-15 DIAGNOSIS — R2681 Unsteadiness on feet: Secondary | ICD-10-CM

## 2021-02-15 NOTE — Therapy (Signed)
[place], [place], [address] Phone: [phone]   Fax:  [phone]  Physical Therapy Treatment  Patient Details  Name: Crystal Larsen MRN: [id number] Date of Birth: 17-Mar-1956 Referring Provider (PT): Rod Can, MD   Encounter Date: 02/15/2021   PT End of Session - 02/15/21 1154    Visit Number 13    Number of Visits 16    Date for PT Re-Evaluation 02/23/21    Authorization Type UHC Medicare    PT Start Time 1148    PT Stop Time 1230    PT Time Calculation (min) 42 min    Activity Tolerance Patient tolerated treatment well    Behavior During Therapy [facility] for tasks assessed/performed           Past Medical History:  Diagnosis Date  . Acute kidney injury ([place])   . Adrenal insufficiency ([place])   . Anemia   . Anxiety   . Arthritis    "left foot; left hand; right shoulder; back" (11/04/2014)  . Elevated liver enzymes   . Gallstones   . GERD (gastroesophageal reflux disease)   . History of gout   . HTN (hypertension)   . Hypothyroidism   . IBS (irritable bowel syndrome)    ?  [name] Lupus ([place]) 07/2019  . Metabolic acidosis   . Migraine    "maybe once/yr" (11/04/2014)  . Neuropathy    right leg  . Obesity   . Pancreatitis   . Pancreatitis   . PONV (postoperative nausea and vomiting) 1981 X 1   only time  . Seizures ([place]) 2003    due to tramadol reaction -- takes depakote for migraine prevention  . Sleep apnea    does not use cpap  . Spondylolisthesis of lumbar region   . Unsteady gait   . Wears glasses     Past Surgical History:  Procedure Laterality Date  . ANKLE GANGLION CYST EXCISION Right 1980's  . ANTERIOR LAT LUMBAR FUSION N/A 12/09/2019   Procedure: Lumbar One-Two Anterolateral lumbar decompression/interbody fusion with lateral plate;  Surgeon: Kristeen Miss, MD;  Location: [facility];  Service: Neurosurgery;  Laterality: N/A;  Lumbar One-Two Anterolateral lumbar  decompression/interbody fusion with lateral plate  . BACK SURGERY    . BREAST SURGERY     biopsy  . BUNIONECTOMY Left 2012  . CARPAL TUNNEL RELEASE Right 05/22/2019   Procedure: RIGHT CARPAL TUNNEL RELEASE;  Surgeon: Daryll Brod, MD;  Location: [facility];  Service: Orthopedics;  Laterality: Right;  . CARPOMETACARPEL SUSPENSION PLASTY Right 05/22/2019   Procedure: ARTHROPLASY CARPOMETACARPAL RIGHT THUMB TRAPEZIUM EXCISION TENDON TRANSFER MICRO LINK SUSPENSION;  Surgeon: Daryll Brod, MD;  Location: [facility];  Service: Orthopedics;  Laterality: Right;  . CESAREAN SECTION  1986  . COLONOSCOPY W/ BIOPSIES AND POLYPECTOMY    . EYE SURGERY Bilateral 2012   "lasered a hole in my iris cause pressure was building up"  . LAPAROSCOPIC CHOLECYSTECTOMY  1990's  . NASAL SINUS SURGERY  1980's  . POSTERIOR LUMBAR FUSION  05/2013   L4-5; S1  . POSTERIOR LUMBAR FUSION  11/04/2014   L3-4  . SHOULDER ARTHROSCOPY W/ ROTATOR CUFF REPAIR Right 2012  . TUBAL LIGATION  1992    There were no vitals filed for this visit.   Subjective Assessment - 02/15/21 1201    Subjective Pt reports everything has been going okay. Pt has had difficulty getting her pillow to remain between her legs during her  eccentric.    Pertinent History Lupus, lumbar fusion L2-S1 (4 surgeries)    Limitations Sitting;Walking;Standing    How long can you sit comfortably? recliner helps, if sitting straight 5 min    How long can you stand comfortably? back gets tired, 20 min    How long can you walk comfortably? it's always painful    Diagnostic tests lumbar MRI 2021: mild retrolisthesis L1/2 with some foraminal narrowing    Patient Stated Goals improve pain, will be new grandparents in April and hopes to be involved    Currently in Pain? Yes    Pain Score 2     Pain Location Hip    Pain Orientation Right    Pain Descriptors / Indicators Aching    Pain Onset More than a month ago                              [facility] Adult PT Treatment/Exercise - 02/15/21 0001      Ambulation/Gait   Ambulation Distance (Feet) 25 Feet    Assistive device None    Gait Comments cues to decrease ER      Knee/Hip Exercises: Aerobic   Nustep 6 min Lvl 6      Knee/Hip Exercises: Standing   Other Standing Knee Exercises Forward mini lunge x5 bilat    Other Standing Knee Exercises Backward mini lunge x10 bilat with rail; backward lunge 2x20 sec hold no rail      Knee/Hip Exercises: Seated   Sit to Sand without UE support;10 reps;2 sets   eccentrics with ball squeeze                   PT Short Term Goals - 02/08/21 1241      PT SHORT TERM GOAL #1   Title Pt will be ind with initial HEP    Time 3    Period Weeks    Status Achieved    Target Date 01/19/21      PT SHORT TERM GOAL #2   Title Pt will demo improved gait pattern to include improved weight shifting into Rt LE and reduce degree of Trendelenburg bil secondary to improved gluteal use in closed chain.    Time 4    Period Weeks    Status Partially Met    Target Date 01/26/21      PT SHORT TERM GOAL #3   Title Pt will report reduced pain with household ambulation by at least 30%    Time 4    Period Weeks    Status Achieved    Target Date 01/26/21      PT SHORT TERM GOAL #4   Title Pt will be able to sit in upright chair (vs recliner) for at least 15 mintues for mealtime with at least 30% less pain.    Baseline Was achieved but in the last week or so she has felt it again    Status On-going      PT SHORT TERM GOAL #5   Title Pt will demo 5x sit to stand without UE use in </= 13 sec    Baseline 15 sec, use of UEs; 9 sec no UEs 01/27/21    Time 4    Period Weeks    Status Achieved    Target Date 01/26/21             PT Long Term Goals - 12/29/20 1303  PT LONG TERM GOAL #1   Title Pt will report improved endurance for light ADLs and household ambulation for up to 20 min before needing  seated break.    Time 8    Period Weeks    Status New    Target Date 02/23/21      PT LONG TERM GOAL #2   Title Patient able to safely amb community distances using cane.    Time 8    Period Weeks    Status New    Target Date 02/23/21      PT LONG TERM GOAL #3   Title Improved Bil hip strength to >= 4+/5 to improve overall function    Baseline bil hips 4/5    Time 8    Period Weeks    Status New    Target Date 02/23/21      PT LONG TERM GOAL #4   Title Improved 5x sit to stand to </= 11 sec to reduce fall risk    Baseline 15 sec    Time 8    Period Weeks    Status New    Target Date 02/23/21      PT LONG TERM GOAL #5   Title Improved FOTO to >/= 50% to demo improved function    Baseline 33%    Time 8    Period Weeks    Status New    Target Date 02/23/21                 Plan - 02/15/21 1222    Clinical Impression Statement Treatment focused on progressing bilat hip strength. Pt able to progress to lunges.    Personal Factors and Comorbidities Comorbidity 1;Comorbidity 2;Time since onset of injury/illness/exacerbation    Comorbidities lumbar fusion L2-S1, Lupus    Examination-Activity Limitations Locomotion Level;Sit;Lift;Stairs;Stand;Bend    Examination-Participation Restrictions Meal Prep;Laundry;Shop;Community Activity;Cleaning    Stability/Clinical Decision Making Stable/Uncomplicated    Rehab Potential Good    PT Frequency 2x / week    PT Duration 8 weeks    PT Treatment/Interventions ADLs/Self Care Home Management;Cryotherapy;Electrical Stimulation;Moist Heat;Iontophoresis 4mg /ml Dexamethasone;Gait training;Stair training;Functional mobility training;Therapeutic exercise;Therapeutic activities;Neuromuscular re-education;Balance training;Patient/family education;Energy conservation;Passive range of motion;Manual techniques;Taping;Spinal Manipulations;Joint Manipulations    PT Next Visit Plan NuStep, gait activities for closed chain weight shifting, core and  hip strength as tol (continue standing hip abduction/upright skating, abductor machines, leg press). Progress eccentric squats as able. Continue to progress single leg strength and stability.    PT Home Exercise Plan Access Code: [email or web address]    Consulted and Agree with Plan of Care Patient           Patient will benefit from skilled therapeutic intervention in order to improve the following deficits and impairments:  Abnormal gait,Decreased range of motion,Difficulty walking,Decreased endurance,Pain,Decreased activity tolerance,Impaired flexibility,Decreased balance,Decreased mobility,Decreased strength  Visit Diagnosis: Pain in right hip  Chronic bilateral low back pain, unspecified whether sciatica present  Muscle weakness (generalized)  Unsteadiness on feet     Problem List Patient Active Problem List   Diagnosis Date Noted  . Hypokalemia 01/12/2021  . Encounter for antineoplastic chemotherapy 11/18/2020  . Systemic lupus erythematosus ([place]) 11/03/2020  . Lupus nephritis ([place]) 11/03/2020  . Proteinuria 11/03/2020  . Other spondylosis with radiculopathy, lumbar region 12/09/2019  . Secondary adrenal insufficiency ([place]) 04/03/2019  . Metabolic acidosis, normal anion gap (NAG) 04/02/2019  . HTN (hypertension)   . Adrenal insufficiency ([place]) 04/01/2019  . Hyponatremia 04/01/2019  . Hypothyroidism 04/01/2019  . AKI (acute  kidney injury) ([place]) 04/01/2019  . Lumbar stenosis with neurogenic claudication 02/19/2017  . Migraine with aura and without status migrainosus, not intractable 07/11/2016  . Spondylolisthesis of lumbar region 11/04/2014  . Postconcussional syndrome 09/30/2013  . Post-concussion headache 09/30/2013  . Esophageal reflux 10/11/2011  . Benign neoplasm of colon 10/11/2011  . Gastritis, chronic 10/11/2011  . Guaiac + stool 09/28/2011  . IBS (irritable bowel syndrome) 09/28/2011  . GERD (gastroesophageal reflux disease) 09/28/2011  . Migraine syndrome  09/28/2011    Gellen April Gordy Levan 02/15/2021, 12:44 PM  [facility] [place], [place], [address] Phone: [phone]   Fax:  [phone]  Name: Crystal Larsen MRN: [id number] Date of Birth: 1956-06-18

## 2021-02-16 ENCOUNTER — Telehealth: Payer: Self-pay | Admitting: Adult Health

## 2021-02-16 NOTE — Telephone Encounter (Signed)
I called patient to discuss Evusheld, a long acting monoclonal antibody injection administered to patients with decreased immune systems or intolerance/allergy to the COVID 19 vaccine as COVID19 prevention.    Unable to reach patient.  LMOM to return my call.  Pamelyn Bancroft, NP  

## 2021-02-17 ENCOUNTER — Other Ambulatory Visit: Payer: Self-pay

## 2021-02-17 ENCOUNTER — Ambulatory Visit (HOSPITAL_BASED_OUTPATIENT_CLINIC_OR_DEPARTMENT_OTHER): Payer: Medicare Other | Admitting: Physical Therapy

## 2021-02-17 DIAGNOSIS — M25551 Pain in right hip: Secondary | ICD-10-CM | POA: Diagnosis not present

## 2021-02-17 DIAGNOSIS — G8929 Other chronic pain: Secondary | ICD-10-CM

## 2021-02-17 DIAGNOSIS — M6281 Muscle weakness (generalized): Secondary | ICD-10-CM

## 2021-02-17 DIAGNOSIS — R2681 Unsteadiness on feet: Secondary | ICD-10-CM

## 2021-02-17 DIAGNOSIS — M545 Low back pain, unspecified: Secondary | ICD-10-CM

## 2021-02-17 IMAGING — CT CT ABD-PELV W/ CM
2 of 5 series · 16 of 46 positions shown, 18 images · IV contrast (Omnipaque)
Comparison: None.

CLINICAL DATA: Epigastric pain.

EXAM:
CT ABDOMEN AND PELVIS WITH CONTRAST
TECHNIQUE: Multidetector CT imaging of the abdomen and pelvis was performed
using the standard protocol following bolus administration of
intravenous contrast.
CONTRAST:  100mL OMNIPAQUE IOHEXOL 300 MG/ML  SOLN

[Series 2: axial st · axial · 0.75mm/px · z∈[+684,+1089]mm · 13 of 91 slices shown, 15 images]
[im 5/91  soft-tissue]
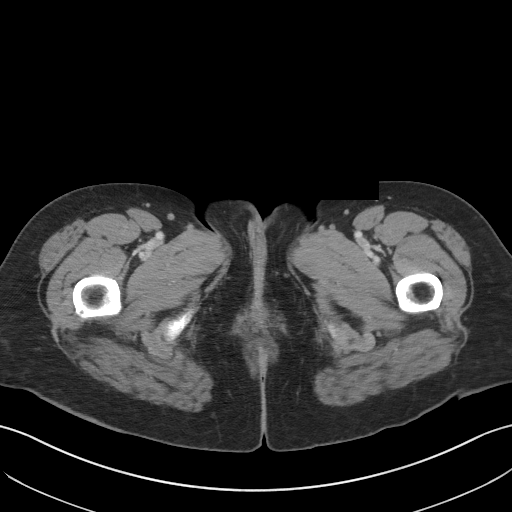
[im 5/91  bone]
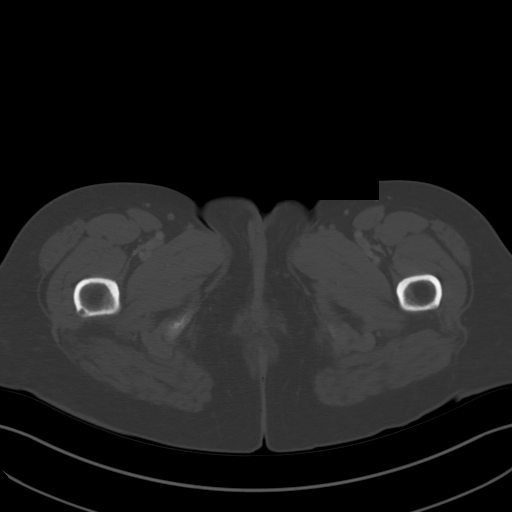
[im 15/91  soft-tissue]
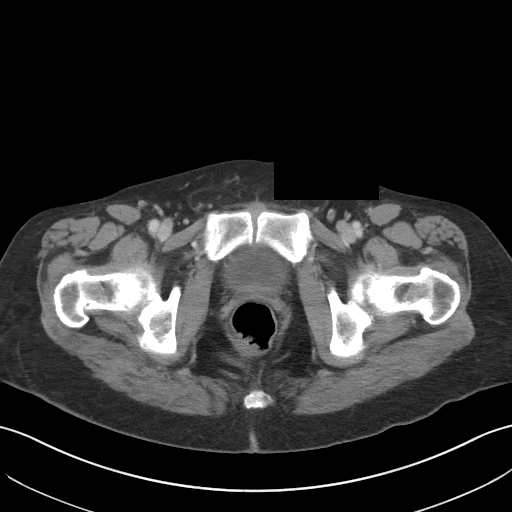
[im 19/91  soft-tissue]
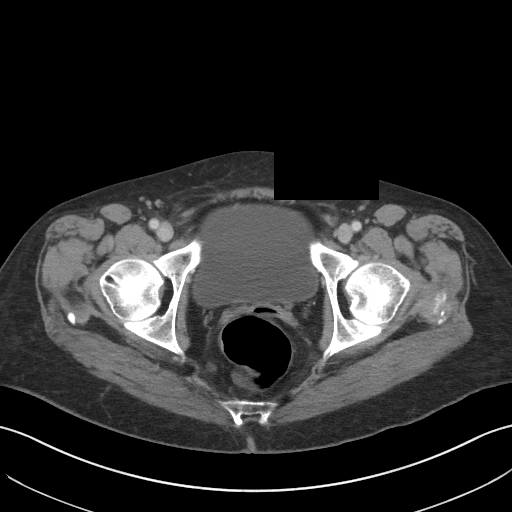
[im 24/91  soft-tissue]
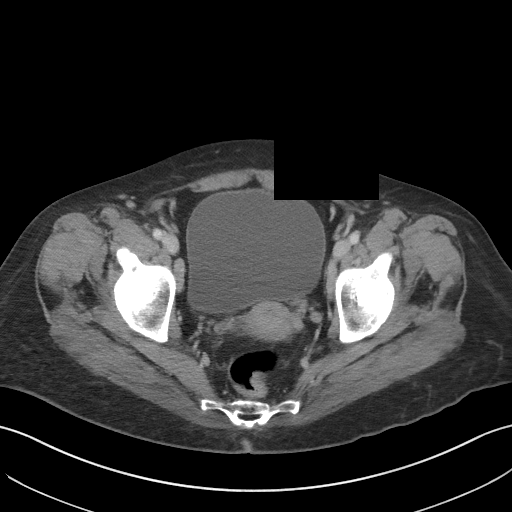
[im 34/91  soft-tissue]
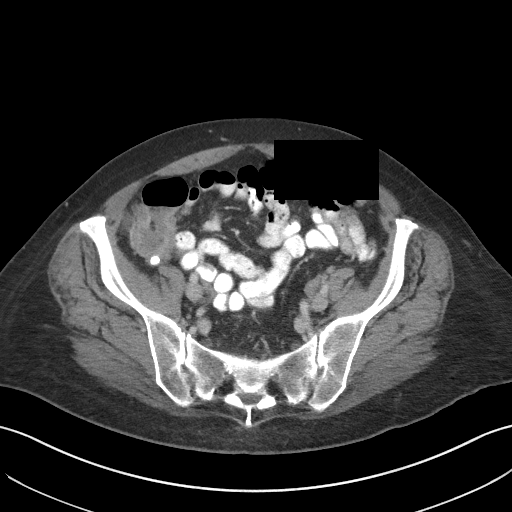
[im 38/91  soft-tissue]
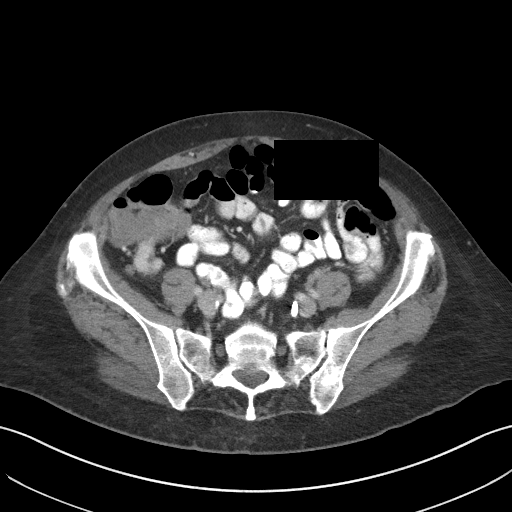
[im 48/91  soft-tissue]
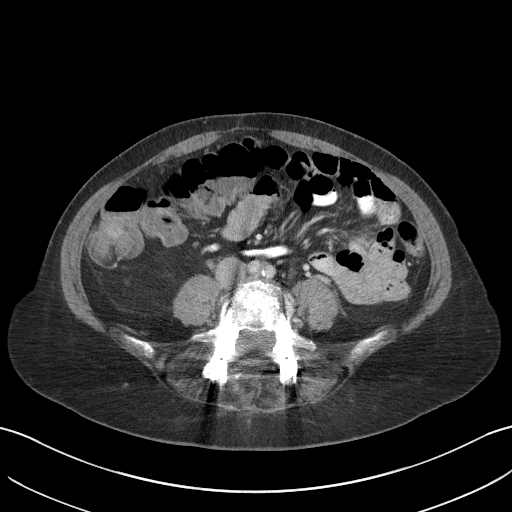
[im 53/91  soft-tissue]
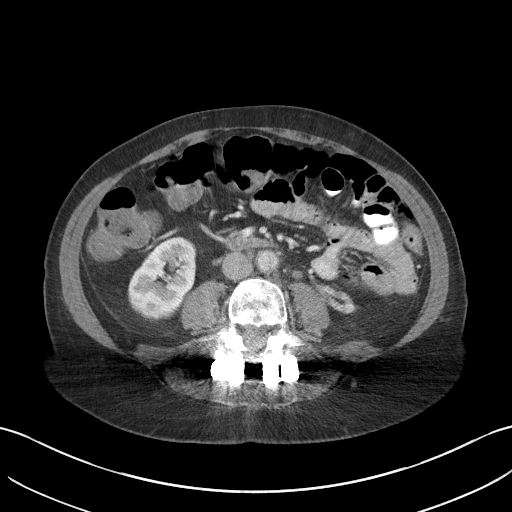
[im 57/91  soft-tissue]
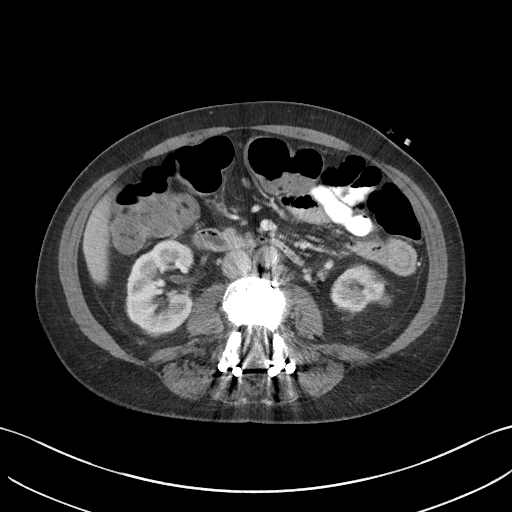
[im 57/91  bone]
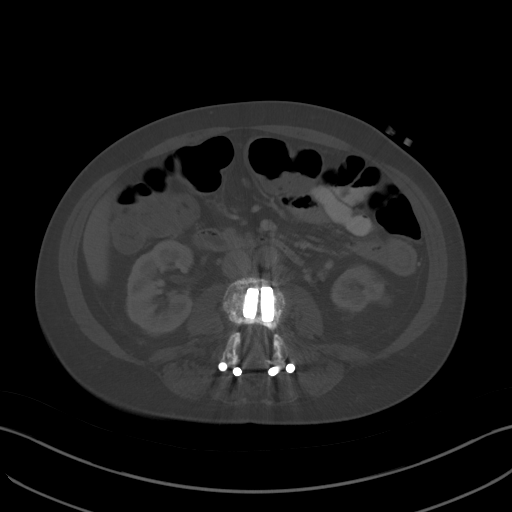
[im 67/91  soft-tissue]
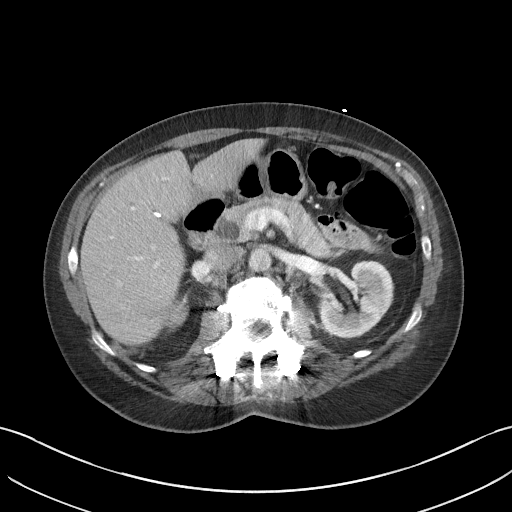
[im 72/91  soft-tissue]
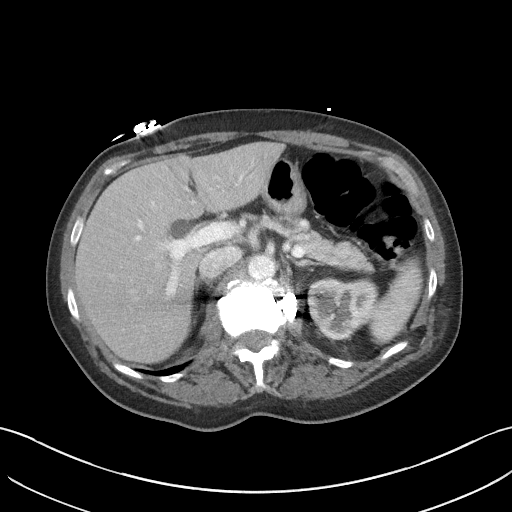
[im 76/91  soft-tissue]
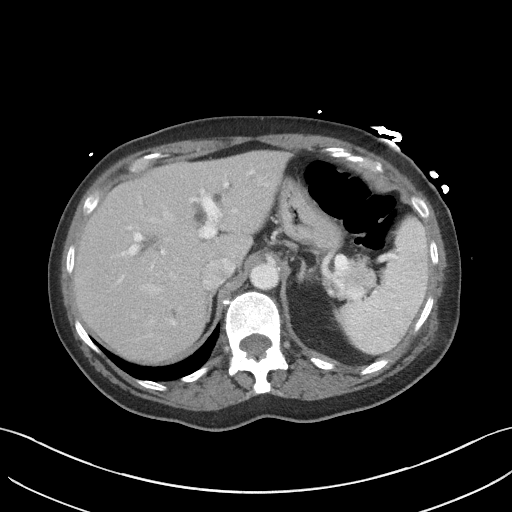
[im 86/91  soft-tissue]
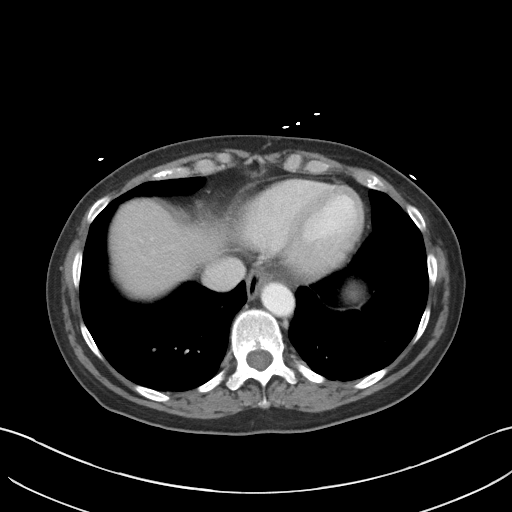

[Series 5: coronal st · coronal · 0.64mm/px · 3 of 101 slices shown]
[im 34/101  soft-tissue]
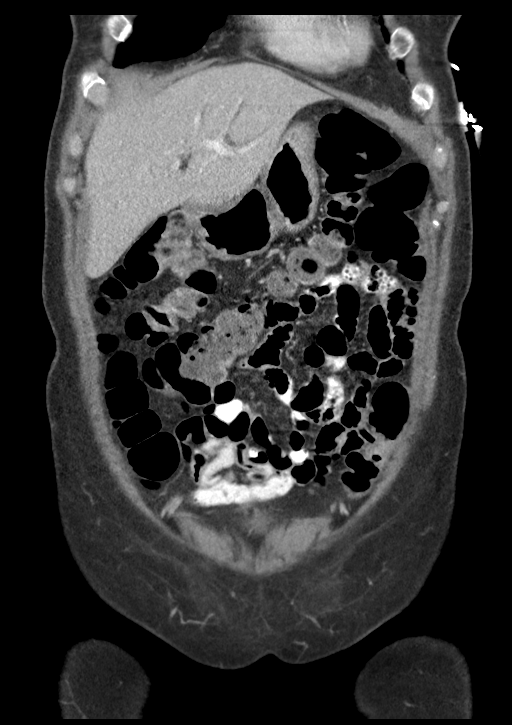
[im 45/101  soft-tissue]
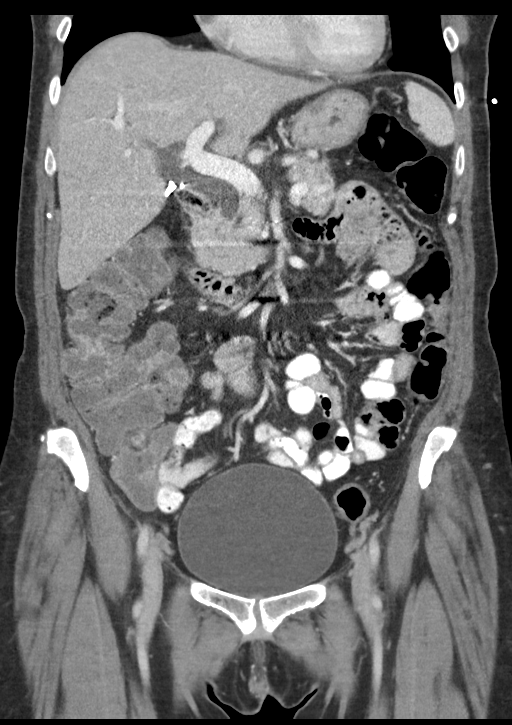
[im 56/101  soft-tissue]
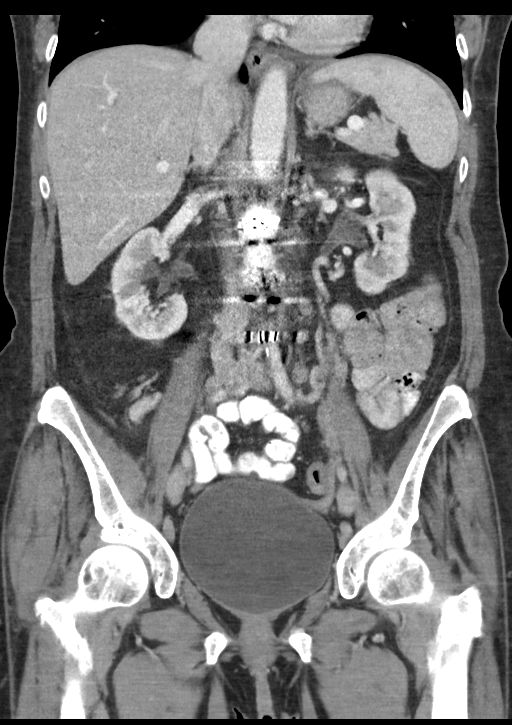

[16 of 46 positions shown; findings below may reference images not displayed]

FINDINGS: Lower chest: No acute abnormality.

Hepatobiliary: No focal liver abnormality is seen. There is mild to
moderate severity intrahepatic biliary dilatation. Status post
cholecystectomy. Dilatation of the common bile duct is also noted
(1.4 cm).

Pancreas: Unremarkable. No pancreatic ductal dilatation or
surrounding inflammatory changes.

Spleen: Normal in size without focal abnormality.

Adrenals/Urinary Tract: Adrenal glands are unremarkable. Kidneys are
normal, without renal calculi or hydronephrosis. A 1.2 cm simple
cyst is seen along the posterolateral aspect of the mid to lower
left kidney. Bladder is unremarkable.

Stomach/Bowel: Stomach is within normal limits. A 7 mm appendicolith
is seen along the base of a normal appearing appendix. No evidence
of bowel wall thickening, distention, or inflammatory changes.

Vascular/Lymphatic: No significant vascular findings are present. No
enlarged abdominal or pelvic lymph nodes.

Reproductive: Uterus and bilateral adnexa are unremarkable.

Other: No abdominal wall hernia or abnormality. No abdominopelvic
ascites.

Musculoskeletal: Bilateral pedicle screws are seen throughout the
lumbar spine. There is associated streak artifact and subsequently
limited evaluation of the adjacent osseous and soft tissue
structures.
IMPRESSION: 1. Status post cholecystectomy.
2. Appendicolith without evidence of appendicitis.
3. Extensive postoperative changes throughout the lumbar spine.

## 2021-02-17 NOTE — Therapy (Signed)
[place], [place], [address] Phone: [phone]   Fax:  [phone]  Physical Therapy Treatment  Patient Details  Name: Crystal Larsen MRN: [id number] Date of Birth: 06-06-1956 Referring Provider (PT): Rod Can, MD   Encounter Date: 02/17/2021   PT End of Session - 02/17/21 1248    Visit Number 14    Number of Visits 16    Date for PT Re-Evaluation 02/23/21    Authorization Type UHC Medicare    PT Start Time 1145    PT Stop Time 1230    PT Time Calculation (min) 45 min    Activity Tolerance Patient tolerated treatment well    Behavior During Therapy [facility] for tasks assessed/performed           Past Medical History:  Diagnosis Date  . Acute kidney injury ([place])   . Adrenal insufficiency ([place])   . Anemia   . Anxiety   . Arthritis    "left foot; left hand; right shoulder; back" (11/04/2014)  . Elevated liver enzymes   . Gallstones   . GERD (gastroesophageal reflux disease)   . History of gout   . HTN (hypertension)   . Hypothyroidism   . IBS (irritable bowel syndrome)    ?  [name] Lupus ([place]) 07/2019  . Metabolic acidosis   . Migraine    "maybe once/yr" (11/04/2014)  . Neuropathy    right leg  . Obesity   . Pancreatitis   . Pancreatitis   . PONV (postoperative nausea and vomiting) 1981 X 1   only time  . Seizures ([place]) 2003    due to tramadol reaction -- takes depakote for migraine prevention  . Sleep apnea    does not use cpap  . Spondylolisthesis of lumbar region   . Unsteady gait   . Wears glasses     Past Surgical History:  Procedure Laterality Date  . ANKLE GANGLION CYST EXCISION Right 1980's  . ANTERIOR LAT LUMBAR FUSION N/A 12/09/2019   Procedure: Lumbar One-Two Anterolateral lumbar decompression/interbody fusion with lateral plate;  Surgeon: Kristeen Miss, MD;  Location: [facility];  Service: Neurosurgery;  Laterality: N/A;  Lumbar One-Two Anterolateral lumbar  decompression/interbody fusion with lateral plate  . BACK SURGERY    . BREAST SURGERY     biopsy  . BUNIONECTOMY Left 2012  . CARPAL TUNNEL RELEASE Right 05/22/2019   Procedure: RIGHT CARPAL TUNNEL RELEASE;  Surgeon: Daryll Brod, MD;  Location: [facility];  Service: Orthopedics;  Laterality: Right;  . CARPOMETACARPEL SUSPENSION PLASTY Right 05/22/2019   Procedure: ARTHROPLASY CARPOMETACARPAL RIGHT THUMB TRAPEZIUM EXCISION TENDON TRANSFER MICRO LINK SUSPENSION;  Surgeon: Daryll Brod, MD;  Location: [facility];  Service: Orthopedics;  Laterality: Right;  . CESAREAN SECTION  1986  . COLONOSCOPY W/ BIOPSIES AND POLYPECTOMY    . EYE SURGERY Bilateral 2012   "lasered a hole in my iris cause pressure was building up"  . LAPAROSCOPIC CHOLECYSTECTOMY  1990's  . NASAL SINUS SURGERY  1980's  . POSTERIOR LUMBAR FUSION  05/2013   L4-5; S1  . POSTERIOR LUMBAR FUSION  11/04/2014   L3-4  . SHOULDER ARTHROSCOPY W/ ROTATOR CUFF REPAIR Right 2012  . TUBAL LIGATION  1992    There were no vitals filed for this visit.   Subjective Assessment - 02/17/21 1152    Subjective Pt reports she was a little sore yesterday.    Pertinent History Lupus, lumbar fusion L2-S1 (4 surgeries)  Limitations Sitting;Walking;Standing    How long can you sit comfortably? recliner helps, if sitting straight 5 min    How long can you stand comfortably? back gets tired, 20 min    How long can you walk comfortably? it's always painful    Diagnostic tests lumbar MRI 2021: mild retrolisthesis L1/2 with some foraminal narrowing    Patient Stated Goals improve pain, will be new grandparents in April and hopes to be involved    Pain Onset More than a month ago                             [facility] Adult PT Treatment/Exercise - 02/17/21 0001      Knee/Hip Exercises: Aerobic   Nustep 6 min Lvl 6      Knee/Hip Exercises: Standing   SLS with Vectors Small toe tap forward & back x10     Other Standing Knee Exercises Tandem stance 2x30 sec EO; tandem walking x25'      Knee/Hip Exercises: Seated   Other Seated Knee/Hip Exercises Staggered backwards lunge into chair 2x10 bilat    Sit to Sand without UE support;10 reps;1 set   eccentrics                   PT Short Term Goals - 02/08/21 1241      PT SHORT TERM GOAL #1   Title Pt will be ind with initial HEP    Time 3    Period Weeks    Status Achieved    Target Date 01/19/21      PT SHORT TERM GOAL #2   Title Pt will demo improved gait pattern to include improved weight shifting into Rt LE and reduce degree of Trendelenburg bil secondary to improved gluteal use in closed chain.    Time 4    Period Weeks    Status Partially Met    Target Date 01/26/21      PT SHORT TERM GOAL #3   Title Pt will report reduced pain with household ambulation by at least 30%    Time 4    Period Weeks    Status Achieved    Target Date 01/26/21      PT SHORT TERM GOAL #4   Title Pt will be able to sit in upright chair (vs recliner) for at least 15 mintues for mealtime with at least 30% less pain.    Baseline Was achieved but in the last week or so she has felt it again    Status On-going      PT SHORT TERM GOAL #5   Title Pt will demo 5x sit to stand without UE use in </= 13 sec    Baseline 15 sec, use of UEs; 9 sec no UEs 01/27/21    Time 4    Period Weeks    Status Achieved    Target Date 01/26/21             PT Long Term Goals - 12/29/20 1303      PT LONG TERM GOAL #1   Title Pt will report improved endurance for light ADLs and household ambulation for up to 20 min before needing seated break.    Time 8    Period Weeks    Status New    Target Date 02/23/21      PT LONG TERM GOAL #2   Title Patient able to safely amb community distances using  cane.    Time 8    Period Weeks    Status New    Target Date 02/23/21      PT LONG TERM GOAL #3   Title Improved Bil hip strength to >= 4+/5 to improve overall  function    Baseline bil hips 4/5    Time 8    Period Weeks    Status New    Target Date 02/23/21      PT LONG TERM GOAL #4   Title Improved 5x sit to stand to </= 11 sec to reduce fall risk    Baseline 15 sec    Time 8    Period Weeks    Status New    Target Date 02/23/21      PT LONG TERM GOAL #5   Title Improved FOTO to >/= 50% to demo improved function    Baseline 33%    Time 8    Period Weeks    Status New    Target Date 02/23/21                 Plan - 02/17/21 1228    Clinical Impression Statement Treatment continues to focus on increasing hip strength and stability in staggered stance to progress improved single leg stance for gait and balance. Pt with improved tandem stance and able to perform tandem walking with tactile cues using no hand rail.    Personal Factors and Comorbidities Comorbidity 1;Comorbidity 2;Time since onset of injury/illness/exacerbation    Comorbidities lumbar fusion L2-S1, Lupus    Examination-Activity Limitations Locomotion Level;Sit;Lift;Stairs;Stand;Bend    Examination-Participation Restrictions Meal Prep;Laundry;Shop;Community Activity;Cleaning    Stability/Clinical Decision Making Stable/Uncomplicated    Rehab Potential Good    PT Frequency 2x / week    PT Duration 8 weeks    PT Treatment/Interventions ADLs/Self Care Home Management;Cryotherapy;Electrical Stimulation;Moist Heat;Iontophoresis 31m/ml Dexamethasone;Gait training;Stair training;Functional mobility training;Therapeutic exercise;Therapeutic activities;Neuromuscular re-education;Balance training;Patient/family education;Energy conservation;Passive range of motion;Manual techniques;Taping;Spinal Manipulations;Joint Manipulations    PT Next Visit Plan NuStep, gait activities for closed chain weight shifting, core and hip strength as tol (continue standing hip abduction/upright skating, abductor machines, leg press). Progress lunges. Continue to progress single leg strength and  stability.    PT Home Exercise Plan Access Code: [email or web address]   Consulted and Agree with Plan of Care Patient           Patient will benefit from skilled therapeutic intervention in order to improve the following deficits and impairments:  Abnormal gait,Decreased range of motion,Difficulty walking,Decreased endurance,Pain,Decreased activity tolerance,Impaired flexibility,Decreased balance,Decreased mobility,Decreased strength  Visit Diagnosis: Pain in right hip  Chronic bilateral low back pain, unspecified whether sciatica present  Muscle weakness (generalized)  Unsteadiness on feet     Problem List Patient Active Problem List   Diagnosis Date Noted  . Hypokalemia 01/12/2021  . Encounter for antineoplastic chemotherapy 11/18/2020  . [place]ic [place]matosus ([place] 11/03/2020  . Lupus nephritis ([place] 11/03/2020  . Proteinuria 11/03/2020  . Other spondylosis with radiculopathy, lumbar region 12/09/2019  . Secondary adrenal insufficiency ([place] 04/03/2019  . Metabolic acidosis, normal anion gap (NAG) 04/02/2019  . HTN (hypertension)   . Ad[place]al insufficiency ([place] 04/01/2019  . Hyponatremia 04/01/2019  . Hypothyroidism 04/01/2019  . AKI (acute kidney injury) ([place] 04/01/2019  . Lumbar stenosis with neurogenic claudication 02/19/2017  . Migraine with aura and without status migrainosus, not intractable 07/11/2016  . Spondylolisthesis of lumbar region 11/04/2014  . Postconcussional syndrome 09/30/2013  . Post-concussion headache 09/30/2013  . Esophageal reflux  10/11/2011  . Benign neoplasm of colon 10/11/2011  . Gastritis, chronic 10/11/2011  . Guaiac + stool 09/28/2011  . IBS (irritable bowel syndrome) 09/28/2011  . GERD (gastroesophageal reflux disease) 09/28/2011  . Migraine syndrome 09/28/2011    [facility] [address] PT, DPT 02/17/2021, 12:53 PM  [facility] [address] [place], [place], [address] Phone:  [phone]   Fax:  [phone]  Name: Crystal Larsen MRN: [id number] Date of Birth: Mar 07, 1956

## 2021-02-22 ENCOUNTER — Other Ambulatory Visit: Payer: Self-pay

## 2021-02-22 ENCOUNTER — Ambulatory Visit (HOSPITAL_BASED_OUTPATIENT_CLINIC_OR_DEPARTMENT_OTHER): Payer: Medicare Other | Admitting: Physical Therapy

## 2021-02-22 DIAGNOSIS — M6281 Muscle weakness (generalized): Secondary | ICD-10-CM

## 2021-02-22 DIAGNOSIS — M25551 Pain in right hip: Secondary | ICD-10-CM | POA: Diagnosis not present

## 2021-02-22 DIAGNOSIS — R2681 Unsteadiness on feet: Secondary | ICD-10-CM

## 2021-02-22 DIAGNOSIS — M545 Low back pain, unspecified: Secondary | ICD-10-CM

## 2021-02-22 NOTE — Therapy (Signed)
[name], [place], [address] Phone: [phone]   Fax:  [phone]  Physical Therapy Treatment and Re-Certification  Patient Details  Name: Crystal Larsen MRN: [id number] Date of Birth: 1956-01-31 Referring Provider (PT): Rod Can, MD   Encounter Date: 02/22/2021   PT End of Session - 02/22/21 1149    Visit Number 15    Number of Visits 16    Date for PT Re-Evaluation 02/23/21    Authorization Type UHC Medicare    PT Start Time 1147    PT Stop Time 1230    PT Time Calculation (min) 43 min    Activity Tolerance Patient tolerated treatment well    Behavior During Therapy [facility] for tasks assessed/performed           Past Medical History:  Diagnosis Date  . Acute kidney injury ([place])   . Adrenal insufficiency ([place])   . Anemia   . Anxiety   . Arthritis    "left foot; left hand; right shoulder; back" (11/04/2014)  . Elevated liver enzymes   . Gallstones   . GERD (gastroesophageal reflux disease)   . History of gout   . HTN (hypertension)   . Hypothyroidism   . IBS (irritable bowel syndrome)    ?  [name] Lupus ([place]) 07/2019  . Metabolic acidosis   . Migraine    "maybe once/yr" (11/04/2014)  . Neuropathy    right leg  . Obesity   . Pancreatitis   . Pancreatitis   . PONV (postoperative nausea and vomiting) 1981 X 1   only time  . Seizures ([place]) 2003    due to tramadol reaction -- takes depakote for migraine prevention  . Sleep apnea    does not use cpap  . Spondylolisthesis of lumbar region   . Unsteady gait   . Wears glasses     Past Surgical History:  Procedure Laterality Date  . ANKLE GANGLION CYST EXCISION Right 1980's  . ANTERIOR LAT LUMBAR FUSION N/A 12/09/2019   Procedure: Lumbar One-Two Anterolateral lumbar decompression/interbody fusion with lateral plate;  Surgeon: Kristeen Miss, MD;  Location: [place];  Service: Neurosurgery;  Laterality: N/A;  Lumbar One-Two Anterolateral  lumbar decompression/interbody fusion with lateral plate  . BACK SURGERY    . BREAST SURGERY     biopsy  . BUNIONECTOMY Left 2012  . CARPAL TUNNEL RELEASE Right 05/22/2019   Procedure: RIGHT CARPAL TUNNEL RELEASE;  Surgeon: Daryll Brod, MD;  Location: [place];  Service: Orthopedics;  Laterality: Right;  . CARPOMETACARPEL SUSPENSION PLASTY Right 05/22/2019   Procedure: ARTHROPLASY CARPOMETACARPAL RIGHT THUMB TRAPEZIUM EXCISION TENDON TRANSFER MICRO LINK SUSPENSION;  Surgeon: Daryll Brod, MD;  Location: [place];  Service: Orthopedics;  Laterality: Right;  . CESAREAN SECTION  1986  . COLONOSCOPY W/ BIOPSIES AND POLYPECTOMY    . EYE SURGERY Bilateral 2012   "lasered a hole in my iris cause pressure was building up"  . LAPAROSCOPIC CHOLECYSTECTOMY  1990's  . NASAL SINUS SURGERY  1980's  . POSTERIOR LUMBAR FUSION  05/2013   L4-5; S1  . POSTERIOR LUMBAR FUSION  11/04/2014   L3-4  . SHOULDER ARTHROSCOPY W/ ROTATOR CUFF REPAIR Right 2012  . TUBAL LIGATION  1992    There were no vitals filed for this visit.   Subjective Assessment - 02/22/21 1239    Subjective Pt states she has been working on the lunges but she still requires full concentration to perform them properly.  Pt agreeable for additional visits of PT.    Pertinent History Lupus, lumbar fusion L2-S1 (4 surgeries)    Limitations Sitting;Walking;Standing    How long can you sit comfortably? recliner helps, if sitting straight 5 min    How long can you stand comfortably? back gets tired, 20 min    How long can you walk comfortably? it's always painful    Diagnostic tests lumbar MRI 2021: mild retrolisthesis L1/2 with some foraminal narrowing    Patient Stated Goals improve pain, will be new grandparents in April and hopes to be involved    Pain Onset More than a month ago                             [facility] Adult PT Treatment/Exercise - 02/22/21 0001      Knee/Hip Exercises: Aerobic    Nustep 6 min Lvl 7      Knee/Hip Exercises: Machines for Strengthening   Hip Cybex Hip abductor 50# 2x10 reps      Knee/Hip Exercises: Standing   SLS with Vectors With slider forward, side and backward x10 bilat; with slider forward x10    Other Standing Knee Exercises Tandem stance 2x30 sec EO; tandem walking x25'    Other Standing Knee Exercises Backward mini lunge x10 bilat with rail; backward lunge 3x20 sec hold no rail      Knee/Hip Exercises: Seated   Other Seated Knee/Hip Exercises Staggered eccentric sit to stand into chair 2x10 bilat                    PT Short Term Goals - 02/08/21 1241      PT SHORT TERM GOAL #1   Title Pt will be ind with initial HEP    Time 3    Period Weeks    Status Achieved    Target Date 01/19/21      PT SHORT TERM GOAL #2   Title Pt will demo improved gait pattern to include improved weight shifting into Rt LE and reduce degree of Trendelenburg bil secondary to improved gluteal use in closed chain.    Time 4    Period Weeks    Status Partially Met    Target Date 01/26/21      PT SHORT TERM GOAL #3   Title Pt will report reduced pain with household ambulation by at least 30%    Time 4    Period Weeks    Status Achieved    Target Date 01/26/21      PT SHORT TERM GOAL #4   Title Pt will be able to sit in upright chair (vs recliner) for at least 15 mintues for mealtime with at least 30% less pain.    Baseline Was achieved but in the last week or so she has felt it again    Status On-going      PT SHORT TERM GOAL #5   Title Pt will demo 5x sit to stand without UE use in </= 13 sec    Baseline 15 sec, use of UEs; 9 sec no UEs 01/27/21    Time 4    Period Weeks    Status Achieved    Target Date 01/26/21             PT Long Term Goals - 02/22/21 1238      PT LONG TERM GOAL #1   Title Pt will report improved endurance for light  ADLs and household ambulation for up to 20 min before needing seated break.    Time 4     Period Weeks    Status On-going    Target Date 03/22/21      PT LONG TERM GOAL #2   Title Patient able to safely amb community distances using cane.    Time --    Period Weeks    Status On-going    Target Date 03/22/21      PT LONG TERM GOAL #3   Title Improved Bil hip strength to >= 4+/5 to improve overall function    Baseline bil hips 4/5    Time --    Period Weeks    Status On-going    Target Date 03/22/21      PT LONG TERM GOAL #4   Title Improved 5x sit to stand to </= 11 sec to reduce fall risk    Baseline 15 sec    Time 8    Period Weeks    Status On-going      PT LONG TERM GOAL #5   Title Improved FOTO to >/= 50% to demo improved function    Baseline 33%    Time --    Period Weeks    Status On-going    Target Date 03/22/21                 Plan - 02/22/21 1237    Clinical Impression Statement Continued focus on hip strengthening and stability. Pt demonstrates L>R hip drop with L LE SLS/weightbearing. At this point, LTGs remain ongoing and she will benefit from additional 4 weeks of PT visits to fully meet her goals.    Personal Factors and Comorbidities Comorbidity 1;Comorbidity 2;Time since onset of injury/illness/exacerbation    Comorbidities lumbar fusion L2-S1, Lupus    Examination-Activity Limitations Locomotion Level;Sit;Lift;Stairs;Stand;Bend    Examination-Participation Restrictions Meal Prep;Laundry;Shop;Community Activity;Cleaning    Stability/Clinical Decision Making Stable/Uncomplicated    Rehab Potential Good    PT Frequency 2x / week    PT Duration 8 weeks    PT Treatment/Interventions ADLs/Self Care Home Management;Cryotherapy;Electrical Stimulation;Moist Heat;Iontophoresis 4mg /ml Dexamethasone;Gait training;Stair training;Functional mobility training;Therapeutic exercise;Therapeutic activities;Neuromuscular re-education;Balance training;Patient/family education;Energy conservation;Passive range of motion;Manual techniques;Taping;Spinal  Manipulations;Joint Manipulations    PT Next Visit Plan NuStep, gait activities for closed chain weight shifting, core and hip strength as tol (continue standing hip abduction/upright skating, abductor machines, leg press). Progress lunges. Continue to progress single leg strength and stability.    PT Home Exercise Plan Access Code: [email or web address]    Consulted and Agree with Plan of Care Patient           Patient will benefit from skilled therapeutic intervention in order to improve the following deficits and impairments:  Abnormal gait,Decreased range of motion,Difficulty walking,Decreased endurance,Pain,Decreased activity tolerance,Impaired flexibility,Decreased balance,Decreased mobility,Decreased strength  Visit Diagnosis: Pain in right hip  Chronic bilateral low back pain, unspecified whether sciatica present  Muscle weakness (generalized)  Unsteadiness on feet     Problem List Patient Active Problem List   Diagnosis Date Noted  . Hypokalemia 01/12/2021  . Encounter for antineoplastic chemotherapy 11/18/2020  . Systemic lupus erythematosus ([place]) 11/03/2020  . Lupus nephritis ([place]) 11/03/2020  . Proteinuria 11/03/2020  . Other spondylosis with radiculopathy, lumbar region 12/09/2019  . Secondary adrenal insufficiency ([place]) 04/03/2019  . Metabolic acidosis, normal anion gap (NAG) 04/02/2019  . HTN (hypertension)   . Adrenal insufficiency ([place]) 04/01/2019  . Hyponatremia 04/01/2019  . Hypothyroidism 04/01/2019  . AKI (  acute kidney injury) ([place]) 04/01/2019  . Lumbar stenosis with neurogenic claudication 02/19/2017  . Migraine with aura and without status migrainosus, not intractable 07/11/2016  . Spondylolisthesis of lumbar region 11/04/2014  . Postconcussional syndrome 09/30/2013  . Post-concussion headache 09/30/2013  . Esophageal reflux 10/11/2011  . Benign neoplasm of colon 10/11/2011  . Gastritis, chronic 10/11/2011  . Guaiac + stool 09/28/2011  . IBS (irritable bowel  syndrome) 09/28/2011  . GERD (gastroesophageal reflux disease) 09/28/2011  . Migraine syndrome 09/28/2011    [facility] [address] PT, DPT 02/22/2021, 12:42 PM  [facility] [place], [place], [address] Phone: [phone]   Fax:  [phone]  Name: KORAIMA ALBERTSEN MRN: [id number] Date of Birth: Feb 11, 1956

## 2021-02-24 ENCOUNTER — Other Ambulatory Visit: Payer: Self-pay

## 2021-02-24 ENCOUNTER — Ambulatory Visit (HOSPITAL_BASED_OUTPATIENT_CLINIC_OR_DEPARTMENT_OTHER): Payer: Medicare Other | Admitting: Physical Therapy

## 2021-02-24 ENCOUNTER — Encounter: Payer: Self-pay | Admitting: Adult Health

## 2021-02-24 ENCOUNTER — Other Ambulatory Visit: Payer: Self-pay | Admitting: Adult Health

## 2021-02-24 DIAGNOSIS — G8929 Other chronic pain: Secondary | ICD-10-CM

## 2021-02-24 DIAGNOSIS — M25551 Pain in right hip: Secondary | ICD-10-CM | POA: Diagnosis not present

## 2021-02-24 DIAGNOSIS — M3214 Glomerular disease in systemic lupus erythematosus: Secondary | ICD-10-CM

## 2021-02-24 DIAGNOSIS — M545 Low back pain, unspecified: Secondary | ICD-10-CM

## 2021-02-24 DIAGNOSIS — R2681 Unsteadiness on feet: Secondary | ICD-10-CM

## 2021-02-24 DIAGNOSIS — M6281 Muscle weakness (generalized): Secondary | ICD-10-CM

## 2021-02-24 NOTE — Therapy (Signed)
[facility] [place], [place], [address] Phone: [phone]   Fax:  [phone]  Physical Therapy Treatment  Patient Details  Name: Crystal Larsen MRN: [id number] Date of Birth: 1956-02-04 Referring Provider (PT): Rod Can, MD   Encounter Date: 02/24/2021   PT End of Session - 02/24/21 1303    Visit Number 16    Number of Visits 24    Date for PT Re-Evaluation 03/22/21    Authorization Type UHC Medicare    Progress Note Due on Visit 20    PT Start Time 1147    PT Stop Time 1235    PT Time Calculation (min) 48 min    Activity Tolerance Patient tolerated treatment well    Behavior During Therapy [facility] for tasks assessed/performed           Past Medical History:  Diagnosis Date  . Acute kidney injury ([place])   . Adrenal insufficiency ([place])   . Anemia   . Anxiety   . Arthritis    "left foot; left hand; right shoulder; back" (11/04/2014)  . Elevated liver enzymes   . Gallstones   . GERD (gastroesophageal reflux disease)   . History of gout   . HTN (hypertension)   . Hypothyroidism   . IBS (irritable bowel syndrome)    ?  [name] Lupus ([place]) 07/2019  . Metabolic acidosis   . Migraine    "maybe once/yr" (11/04/2014)  . Neuropathy    right leg  . Obesity   . Pancreatitis   . Pancreatitis   . PONV (postoperative nausea and vomiting) 1981 X 1   only time  . Seizures ([place]) 2003    due to tramadol reaction -- takes depakote for migraine prevention  . Sleep apnea    does not use cpap  . Spondylolisthesis of lumbar region   . Unsteady gait   . Wears glasses     Past Surgical History:  Procedure Laterality Date  . ANKLE GANGLION CYST EXCISION Right 1980's  . ANTERIOR LAT LUMBAR FUSION N/A 12/09/2019   Procedure: Lumbar One-Two Anterolateral lumbar decompression/interbody fusion with lateral plate;  Surgeon: Kristeen Miss, MD;  Location: [place];  Service: Neurosurgery;  Laterality: N/A;  Lumbar One-Two  Anterolateral lumbar decompression/interbody fusion with lateral plate  . BACK SURGERY    . BREAST SURGERY     biopsy  . BUNIONECTOMY Left 2012  . CARPAL TUNNEL RELEASE Right 05/22/2019   Procedure: RIGHT CARPAL TUNNEL RELEASE;  Surgeon: Daryll Brod, MD;  Location: [place];  Service: Orthopedics;  Laterality: Right;  . CARPOMETACARPEL SUSPENSION PLASTY Right 05/22/2019   Procedure: ARTHROPLASY CARPOMETACARPAL RIGHT THUMB TRAPEZIUM EXCISION TENDON TRANSFER MICRO LINK SUSPENSION;  Surgeon: Daryll Brod, MD;  Location: [place];  Service: Orthopedics;  Laterality: Right;  . CESAREAN SECTION  1986  . COLONOSCOPY W/ BIOPSIES AND POLYPECTOMY    . EYE SURGERY Bilateral 2012   "lasered a hole in my iris cause pressure was building up"  . LAPAROSCOPIC CHOLECYSTECTOMY  1990's  . NASAL SINUS SURGERY  1980's  . POSTERIOR LUMBAR FUSION  05/2013   L4-5; S1  . POSTERIOR LUMBAR FUSION  11/04/2014   L3-4  . SHOULDER ARTHROSCOPY W/ ROTATOR CUFF REPAIR Right 2012  . TUBAL LIGATION  1992    There were no vitals filed for this visit.   Subjective Assessment - 02/24/21 1152    Subjective Pt reports some R hip snapping in the front (noticed it end  of last week) and only when walking without the cane.    Pertinent History Lupus, lumbar fusion L2-S1 (4 surgeries)    Limitations Sitting;Walking;Standing    How long can you sit comfortably? recliner helps, if sitting straight 5 min    How long can you stand comfortably? back gets tired, 20 min    How long can you walk comfortably? it's always painful    Diagnostic tests lumbar MRI 2021: mild retrolisthesis L1/2 with some foraminal narrowing    Patient Stated Goals improve pain, will be new grandparents in April and hopes to be involved    Currently in Pain? Yes    Pain Score 2     Pain Location Hip    Pain Orientation Right    Pain Descriptors / Indicators Aching    Pain Type Chronic pain    Pain Onset More than a month ago                     [facility] Adult PT Treatment/Exercise - 02/24/21 0001      Knee/Hip Exercises: Aerobic   Nustep 6 min Lvl 7      Knee/Hip Exercises: Machines for Strengthening   Other Machine Shuttle: sidelying 31# x10, 50# x10      Knee/Hip Exercises: Standing   SLS with Vectors 3x30 sec; forward step tapping 2x10   Foot on 2" step   Other Standing Knee Exercises forward mini lunge 2x10 sliding foot forward 1 hand rail      Manual Therapy   Joint Mobilization R hip grade III inferior and lateral mobilization                    PT Short Term Goals - 02/08/21 1241      PT SHORT TERM GOAL #1   Title Pt will be ind with initial HEP    Time 3    Period Weeks    Status Achieved    Target Date 01/19/21      PT SHORT TERM GOAL #2   Title Pt will demo improved gait pattern to include improved weight shifting into Rt LE and reduce degree of Trendelenburg bil secondary to improved gluteal use in closed chain.    Time 4    Period Weeks    Status Partially Met    Target Date 01/26/21      PT SHORT TERM GOAL #3   Title Pt will report reduced pain with household ambulation by at least 30%    Time 4    Period Weeks    Status Achieved    Target Date 01/26/21      PT SHORT TERM GOAL #4   Title Pt will be able to sit in upright chair (vs recliner) for at least 15 mintues for mealtime with at least 30% less pain.    Baseline Was achieved but in the last week or so she has felt it again    Status On-going      PT SHORT TERM GOAL #5   Title Pt will demo 5x sit to stand without UE use in </= 13 sec    Baseline 15 sec, use of UEs; 9 sec no UEs 01/27/21    Time 4    Period Weeks    Status Achieved    Target Date 01/26/21             PT Long Term Goals - 02/22/21 1238      PT LONG TERM  GOAL #1   Title Pt will report improved endurance for light ADLs and household ambulation for up to 20 min before needing seated break.    Time 4    Period Weeks    Status  On-going    Target Date 03/22/21      PT LONG TERM GOAL #2   Title Patient able to safely amb community distances using cane.    Time --    Period Weeks    Status On-going    Target Date 03/22/21      PT LONG TERM GOAL #3   Title Improved Bil hip strength to >= 4+/5 to improve overall function    Baseline bil hips 4/5    Time --    Period Weeks    Status On-going    Target Date 03/22/21      PT LONG TERM GOAL #4   Title Improved 5x sit to stand to </= 11 sec to reduce fall risk    Baseline 15 sec    Time 8    Period Weeks    Status On-going      PT LONG TERM GOAL #5   Title Improved FOTO to >/= 50% to demo improved function    Baseline 33%    Time --    Period Weeks    Status On-going    Target Date 03/22/21                 Plan - 02/24/21 1259    Clinical Impression Statement Continued to focus on hip strengthening and stability. Pt with improving hip drop and decrease hip ER. Pt reporting R hip "cramping" and tightness while performing sidelying leg press -- addressed with hip mobilization with improved comfort.    Personal Factors and Comorbidities Comorbidity 1;Comorbidity 2;Time since onset of injury/illness/exacerbation    Comorbidities lumbar fusion L2-S1, Lupus    Examination-Activity Limitations Locomotion Level;Sit;Lift;Stairs;Stand;Bend    Examination-Participation Restrictions Meal Prep;Laundry;Shop;Community Activity;Cleaning    Stability/Clinical Decision Making Stable/Uncomplicated    Rehab Potential Good    PT Frequency 2x / week    PT Duration 8 weeks    PT Treatment/Interventions ADLs/Self Care Home Management;Cryotherapy;Electrical Stimulation;Moist Heat;Iontophoresis 4mg /ml Dexamethasone;Gait training;Stair training;Functional mobility training;Therapeutic exercise;Therapeutic activities;Neuromuscular re-education;Balance training;Patient/family education;Energy conservation;Passive range of motion;Manual techniques;Taping;Spinal  Manipulations;Joint Manipulations    PT Next Visit Plan NuStep warm-up, gait activities for closed chain weight shifting, core and hip strength as tol (continue standing hip abduction/upright skating, abductor machines, leg press). Progress lunges. Continue to progress single leg strength and stability.    PT Home Exercise Plan Access Code: [email or web address]    Consulted and Agree with Plan of Care Patient           Patient will benefit from skilled therapeutic intervention in order to improve the following deficits and impairments:  Abnormal gait,Decreased range of motion,Difficulty walking,Decreased endurance,Pain,Decreased activity tolerance,Impaired flexibility,Decreased balance,Decreased mobility,Decreased strength  Visit Diagnosis: Pain in right hip  Chronic bilateral low back pain, unspecified whether sciatica present  Muscle weakness (generalized)  Unsteadiness on feet     Problem List Patient Active Problem List   Diagnosis Date Noted  . Hypokalemia 01/12/2021  . Encounter for antineoplastic chemotherapy 11/18/2020  . Systemic lupus erythematosus ([place]) 11/03/2020  . Lupus nephritis ([place]) 11/03/2020  . Proteinuria 11/03/2020  . Other spondylosis with radiculopathy, lumbar region 12/09/2019  . Secondary adrenal insufficiency ([place]) 04/03/2019  . Metabolic acidosis, normal anion gap (NAG) 04/02/2019  . HTN (hypertension)   . Adrenal insufficiency ([place])  04/01/2019  . Hyponatremia 04/01/2019  . Hypothyroidism 04/01/2019  . AKI (acute kidney injury) ([place]) 04/01/2019  . Lumbar stenosis with neurogenic claudication 02/19/2017  . Migraine with aura and without status migrainosus, not intractable 07/11/2016  . Spondylolisthesis of lumbar region 11/04/2014  . Postconcussional syndrome 09/30/2013  . Post-concussion headache 09/30/2013  . Esophageal reflux 10/11/2011  . Benign neoplasm of colon 10/11/2011  . Gastritis, chronic 10/11/2011  . Guaiac + stool 09/28/2011  . IBS  (irritable bowel syndrome) 09/28/2011  . GERD (gastroesophageal reflux disease) 09/28/2011  . Migraine syndrome 09/28/2011    [facility] [address] PT, DPT 02/24/2021, 1:08 PM  [facility] [address] [place], [place], [address] Phone: [phone]   Fax:  [phone]  Name: Crystal Larsen MRN: [id number] Date of Birth: [birth year]

## 2021-02-24 NOTE — Progress Notes (Signed)
I connected by phone with Crystal Larsen on 02/24/2021, 1:44 PM to discuss the potential use of a new treatment, tixagevimab/cilgavimab, for pre-exposure prophylaxis for prevention of coronavirus disease 2019 (COVID-19) caused by the SARS-CoV-2 virus.  This patient is a 65 y.o. female that meets the FDA criteria for Emergency Use Authorization of tixagevimab/cilgavimab for pre-exposure prophylaxis of COVID-19 disease. Pt meets following criteria:  Age >12 yr and weight > 40kg  Not currently infected with SARS-CoV-2 and has no known recent exposure to an individual infected with SARS-CoV-2 AND o Who has moderate to severe immune compromise due to a medical condition or receipt of immunosuppressive medications or treatments and may not mount an adequate immune response to COVID-19 vaccination or  o Vaccination with any available COVID-19 vaccine, according to the approved or authorized schedule, is not recommended due to a history of severe adverse reaction (e.g., severe allergic reaction) to a COVID-19 vaccine(s) and/or COVID-19 vaccine component(s).  o Patient meets the following definition of mod-severe immune compromised status: 1. Received B-cell depleting therapies (e.g. rituximab, obinutuzumab, ocrelizumab, alemtuzumab) within last 6 months & age > or = 6  I have spoken and communicated the following to the patient or parent/caregiver regarding COVID monoclonal antibody treatment:  1. FDA has authorized the emergency use of tixagevimab/cilgavimab for the pre-exposure prophylaxis of COVID-19 in patients with moderate-severe immunocompromised status, who meet above EUA criteria.  2. The significant known and potential risks and benefits of COVID monoclonal antibody, and the extent to which such potential risks and benefits are unknown.  3. Information on available alternative treatments and the risks and benefits of those alternatives, including clinical trials.  4. The patient or  parent/caregiver has the option to accept or refuse COVID monoclonal antibody treatment.  After reviewing this information with the patient, agree to receive tixagevimab/cilgavimab.  Patient scheduled at [facility].    Scot Dock, NP, 02/24/2021, 1:44 PM

## 2021-03-01 ENCOUNTER — Other Ambulatory Visit: Payer: Self-pay

## 2021-03-01 ENCOUNTER — Ambulatory Visit (HOSPITAL_BASED_OUTPATIENT_CLINIC_OR_DEPARTMENT_OTHER): Payer: Medicare Other | Attending: Orthopedic Surgery | Admitting: Physical Therapy

## 2021-03-01 DIAGNOSIS — G8929 Other chronic pain: Secondary | ICD-10-CM | POA: Insufficient documentation

## 2021-03-01 DIAGNOSIS — R2681 Unsteadiness on feet: Secondary | ICD-10-CM | POA: Diagnosis present

## 2021-03-01 DIAGNOSIS — M25551 Pain in right hip: Secondary | ICD-10-CM | POA: Insufficient documentation

## 2021-03-01 DIAGNOSIS — M545 Low back pain, unspecified: Secondary | ICD-10-CM | POA: Insufficient documentation

## 2021-03-01 DIAGNOSIS — M6281 Muscle weakness (generalized): Secondary | ICD-10-CM | POA: Insufficient documentation

## 2021-03-01 NOTE — Therapy (Signed)
[facility] [address] [place], [place], [address] Phone: [phone]   Fax:  [phone]  Physical Therapy Treatment  Patient Details  Name: Crystal Larsen MRN: [id number] Date of Birth: 03-Mar-1956 Referring Provider (PT): Rod Can, MD   Encounter Date: 03/01/2021   PT End of Session - 03/01/21 1058    Visit Number 17    Number of Visits 24    Date for PT Re-Evaluation 03/22/21    Authorization Type [employer] Medicare    Progress Note Due on Visit 20    PT Start Time 1015    PT Stop Time 1100    PT Time Calculation (min) 45 min    Activity Tolerance Patient tolerated treatment well    Behavior During Therapy [facility] for tasks assessed/performed           Past Medical History:  Diagnosis Date  . Acute kidney injury ([place])   . Adrenal insufficiency ([place])   . Anemia   . Anxiety   . Arthritis    "left foot; left hand; right shoulder; back" (11/04/2014)  . Elevated liver enzymes   . Gallstones   . GERD (gastroesophageal reflux disease)   . History of gout   . HTN (hypertension)   . Hypothyroidism   . IBS (irritable bowel syndrome)    ?  [name] Lupus ([place]) 07/2019  . Metabolic acidosis   . Migraine    "maybe once/yr" (11/04/2014)  . Neuropathy    right leg  . Obesity   . Pancreatitis   . Pancreatitis   . PONV (postoperative nausea and vomiting) 1981 X 1   only time  . Seizures ([place]) 2003    due to tramadol reaction -- takes depakote for migraine prevention  . Sleep apnea    does not use cpap  . Spondylolisthesis of lumbar region   . Unsteady gait   . Wears glasses     Past Surgical History:  Procedure Laterality Date  . ANKLE GANGLION CYST EXCISION Right 1980's  . ANTERIOR LAT LUMBAR FUSION N/A 12/09/2019   Procedure: Lumbar One-Two Anterolateral lumbar decompression/interbody fusion with lateral plate;  Surgeon: Kristeen Miss, MD;  Location: [facility];  Service: Neurosurgery;  Laterality: N/A;  Lumbar One-Two  Anterolateral lumbar decompression/interbody fusion with lateral plate  . BACK SURGERY    . BREAST SURGERY     biopsy  . BUNIONECTOMY Left 2012  . CARPAL TUNNEL RELEASE Right 05/22/2019   Procedure: RIGHT CARPAL TUNNEL RELEASE;  Surgeon: Daryll Brod, MD;  Location: [facility];  Service: Orthopedics;  Laterality: Right;  . CARPOMETACARPEL SUSPENSION PLASTY Right 05/22/2019   Procedure: ARTHROPLASY CARPOMETACARPAL RIGHT THUMB TRAPEZIUM EXCISION TENDON TRANSFER MICRO LINK SUSPENSION;  Surgeon: Daryll Brod, MD;  Location: [place];  Service: Orthopedics;  Laterality: Right;  . CESAREAN SECTION  1986  . COLONOSCOPY W/ BIOPSIES AND POLYPECTOMY    . EYE SURGERY Bilateral 2012   "lasered a hole in my iris cause pressure was building up"  . LAPAROSCOPIC CHOLECYSTECTOMY  1990's  . NASAL SINUS SURGERY  1980's  . POSTERIOR LUMBAR FUSION  05/2013   L4-5; S1  . POSTERIOR LUMBAR FUSION  11/04/2014   L3-4  . SHOULDER ARTHROSCOPY W/ ROTATOR CUFF REPAIR Right 2012  . TUBAL LIGATION  1992    There were no vitals filed for this visit.   Subjective Assessment - 03/01/21 1021    Subjective Pt states she forgets her form at times with the lunges. Pt  reports she is not feeling as good today. Pt states she ran around a lot yesterday and stayed up for the game last night.    Pertinent History Lupus, lumbar fusion L2-S1 (4 surgeries)    Limitations Sitting;Walking;Standing    How long can you sit comfortably? recliner helps, if sitting straight 5 min    How long can you stand comfortably? back gets tired, 20 min    How long can you walk comfortably? it's always painful    Diagnostic tests lumbar MRI 2021: mild retrolisthesis L1/2 with some foraminal narrowing    Patient Stated Goals improve pain, will be new grandparents in April and hopes to be involved    Currently in Pain? Yes    Pain Score 2     Pain Location Hip    Pain Orientation Right    Pain Descriptors / Indicators  Aching    Pain Type Chronic pain    Pain Onset More than a month ago                             [facility] Adult PT Treatment/Exercise - 03/01/21 0001      Knee/Hip Exercises: Aerobic   Nustep 6 min Lvl 6      Knee/Hip Exercises: Machines for Strengthening   Other Machine Shuttle: sidelying 50#      Knee/Hip Exercises: Supine   Bridges Strengthening;Both;10 reps;2 sets   with ball squeeze + PPT   Knee Flexion Strengthening;Both;2 sets;10 reps    Other Supine Knee/Hip Exercises Pball knee flexion supine 2x10; pball side flexion x10      Manual Therapy   Joint Mobilization R hip grade III inferior mobilization                    PT Short Term Goals - 02/08/21 1241      PT SHORT TERM GOAL #1   Title Pt will be ind with initial HEP    Time 3    Period Weeks    Status Achieved    Target Date 01/19/21      PT SHORT TERM GOAL #2   Title Pt will demo improved gait pattern to include improved weight shifting into Rt LE and reduce degree of Trendelenburg bil secondary to improved gluteal use in closed chain.    Time 4    Period Weeks    Status Partially Met    Target Date 01/26/21      PT SHORT TERM GOAL #3   Title Pt will report reduced pain with household ambulation by at least 30%    Time 4    Period Weeks    Status Achieved    Target Date 01/26/21      PT SHORT TERM GOAL #4   Title Pt will be able to sit in upright chair (vs recliner) for at least 15 mintues for mealtime with at least 30% less pain.    Baseline Was achieved but in the last week or so she has felt it again    Status On-going      PT SHORT TERM GOAL #5   Title Pt will demo 5x sit to stand without UE use in </= 13 sec    Baseline 15 sec, use of UEs; 9 sec no UEs 01/27/21    Time 4    Period Weeks    Status Achieved    Target Date 01/26/21  PT Long Term Goals - 02/22/21 1238      PT LONG TERM GOAL #1   Title Pt will report improved endurance for light ADLs and  household ambulation for up to 20 min before needing seated break.    Time 4    Period Weeks    Status On-going    Target Date 03/22/21      PT LONG TERM GOAL #2   Title Patient able to safely amb community distances using cane.    Time --    Period Weeks    Status On-going    Target Date 03/22/21      PT LONG TERM GOAL #3   Title Improved Bil hip strength to >= 4+/5 to improve overall function    Baseline bil hips 4/5    Time --    Period Weeks    Status On-going    Target Date 03/22/21      PT LONG TERM GOAL #4   Title Improved 5x sit to stand to </= 11 sec to reduce fall risk    Baseline 15 sec    Time 8    Period Weeks    Status On-going      PT LONG TERM GOAL #5   Title Improved FOTO to >/= 50% to demo improved function    Baseline 33%    Time --    Period Weeks    Status On-going    Target Date 03/22/21                 Plan - 03/01/21 1047    Clinical Impression Statement Continued to focus on core stabilization this session. Continued work on hip abductor strengthening.    Personal Factors and Comorbidities Comorbidity 1;Comorbidity 2;Time since onset of injury/illness/exacerbation    Comorbidities lumbar fusion L2-S1, Lupus    Examination-Activity Limitations Locomotion Level;Sit;Lift;Stairs;Stand;Bend    Examination-Participation Restrictions Meal Prep;Laundry;Shop;Community Activity;Cleaning    Stability/Clinical Decision Making Stable/Uncomplicated    Rehab Potential Good    PT Frequency 2x / week    PT Duration 8 weeks    PT Treatment/Interventions ADLs/Self Care Home Management;Cryotherapy;Electrical Stimulation;Moist Heat;Iontophoresis 4mg /ml Dexamethasone;Gait training;Stair training;Functional mobility training;Therapeutic exercise;Therapeutic activities;Neuromuscular re-education;Balance training;Patient/family education;Energy conservation;Passive range of motion;Manual techniques;Taping;Spinal Manipulations;Joint Manipulations    PT Next  Visit Plan NuStep warm-up, gait activities for closed chain weight shifting, core and hip strength as tol (continue standing hip abduction/upright skating, abductor machines, leg press). Progress lunges. Continue to progress single leg strength and stability.    PT Home Exercise Plan Access Code: [email or web address]    Consulted and Agree with Plan of Care Patient           Patient will benefit from skilled therapeutic intervention in order to improve the following deficits and impairments:  Abnormal gait,Decreased range of motion,Difficulty walking,Decreased endurance,Pain,Decreased activity tolerance,Impaired flexibility,Decreased balance,Decreased mobility,Decreased strength  Visit Diagnosis: Pain in right hip  Chronic bilateral low back pain, unspecified whether sciatica present  Muscle weakness (generalized)  Unsteadiness on feet     Problem List Patient Active Problem List   Diagnosis Date Noted  . Hypokalemia 01/12/2021  . Encounter for antineoplastic chemotherapy 11/18/2020  . Systemic lupus erythematosus ([place]) 11/03/2020  . Lupus nephritis ([place]) 11/03/2020  . Proteinuria 11/03/2020  . Other spondylosis with radiculopathy, lumbar region 12/09/2019  . Secondary adrenal insufficiency ([place]) 04/03/2019  . Metabolic acidosis, normal anion gap (NAG) 04/02/2019  . HTN (hypertension)   . Adrenal insufficiency ([place]) 04/01/2019  . Hyponatremia 04/01/2019  . Hypothyroidism  04/01/2019  . AKI (acute kidney injury) ([place]) 04/01/2019  . Lumbar stenosis with neurogenic claudication 02/19/2017  . Migraine with aura and without status migrainosus, not intractable 07/11/2016  . Spondylolisthesis of lumbar region 11/04/2014  . Postconcussional syndrome 09/30/2013  . Post-concussion headache 09/30/2013  . Esophageal reflux 10/11/2011  . Benign neoplasm of colon 10/11/2011  . Gastritis, chronic 10/11/2011  . Guaiac + stool 09/28/2011  . IBS (irritable bowel syndrome) 09/28/2011  . GERD  (gastroesophageal reflux disease) 09/28/2011  . Migraine syndrome 09/28/2011    [facility] April Ma L Asbury Hair PT, DPT 03/01/2021, 11:05 AM  [facility] [place], [place], [address] Phone: [phone]   Fax:  [phone]  Name: Crystal Larsen MRN: [id number] Date of Birth: [birth year]

## 2021-03-03 ENCOUNTER — Other Ambulatory Visit: Payer: Self-pay

## 2021-03-03 ENCOUNTER — Ambulatory Visit (HOSPITAL_BASED_OUTPATIENT_CLINIC_OR_DEPARTMENT_OTHER): Payer: Medicare Other | Admitting: Physical Therapy

## 2021-03-03 DIAGNOSIS — R2681 Unsteadiness on feet: Secondary | ICD-10-CM

## 2021-03-03 DIAGNOSIS — M25551 Pain in right hip: Secondary | ICD-10-CM | POA: Diagnosis not present

## 2021-03-03 DIAGNOSIS — M6281 Muscle weakness (generalized): Secondary | ICD-10-CM

## 2021-03-03 DIAGNOSIS — G8929 Other chronic pain: Secondary | ICD-10-CM

## 2021-03-03 NOTE — Therapy (Signed)
[facility] [address] [place], [place], [address] Phone: [phone]   Fax:  [phone]  Physical Therapy Treatment  Patient Details  Name: DERETHA ERTLE MRN: [id number] Date of Birth: 1956-05-30 Referring Provider (PT): Rod Can, MD   Encounter Date: 03/03/2021   PT End of Session - 03/03/21 1250    Visit Number 18    Number of Visits 24    Date for PT Re-Evaluation 03/22/21    Authorization Type UHC Medicare    Progress Note Due on Visit 20    PT Start Time 1145    PT Stop Time 1230    PT Time Calculation (min) 45 min    Activity Tolerance Patient tolerated treatment well    Behavior During Therapy [facility] for tasks assessed/performed           Past Medical History:  Diagnosis Date  . Acute kidney injury ([place])   . Adrenal insufficiency ([place])   . Anemia   . Anxiety   . Arthritis    "left foot; left hand; right shoulder; back" (11/04/2014)  . Elevated liver enzymes   . Gallstones   . GERD (gastroesophageal reflux disease)   . History of gout   . HTN (hypertension)   . Hypothyroidism   . IBS (irritable bowel syndrome)    ?  [name] Lupus ([place]) 07/2019  . Metabolic acidosis   . Migraine    "maybe once/yr" (11/04/2014)  . Neuropathy    right leg  . Obesity   . Pancreatitis   . Pancreatitis   . PONV (postoperative nausea and vomiting) 1981 X 1   only time  . Seizures ([place]) 2003    due to tramadol reaction -- takes depakote for migraine prevention  . Sleep apnea    does not use cpap  . Spondylolisthesis of lumbar region   . Unsteady gait   . Wears glasses     Past Surgical History:  Procedure Laterality Date  . ANKLE GANGLION CYST EXCISION Right 1980's  . ANTERIOR LAT LUMBAR FUSION N/A 12/09/2019   Procedure: Lumbar One-Two Anterolateral lumbar decompression/interbody fusion with lateral plate;  Surgeon: Kristeen Miss, MD;  Location: [facility];  Service: Neurosurgery;  Laterality: N/A;  Lumbar One-Two  Anterolateral lumbar decompression/interbody fusion with lateral plate  . BACK SURGERY    . BREAST SURGERY     biopsy  . BUNIONECTOMY Left 2012  . CARPAL TUNNEL RELEASE Right 05/22/2019   Procedure: RIGHT CARPAL TUNNEL RELEASE;  Surgeon: Daryll Brod, MD;  Location: [facility];  Service: Orthopedics;  Laterality: Right;  . CARPOMETACARPEL SUSPENSION PLASTY Right 05/22/2019   Procedure: ARTHROPLASY CARPOMETACARPAL RIGHT THUMB TRAPEZIUM EXCISION TENDON TRANSFER MICRO LINK SUSPENSION;  Surgeon: Daryll Brod, MD;  Location: [facility];  Service: Orthopedics;  Laterality: Right;  . CESAREAN SECTION  1986  . COLONOSCOPY W/ BIOPSIES AND POLYPECTOMY    . EYE SURGERY Bilateral 2012   "lasered a hole in my iris cause pressure was building up"  . LAPAROSCOPIC CHOLECYSTECTOMY  1990's  . NASAL SINUS SURGERY  1980's  . POSTERIOR LUMBAR FUSION  05/2013   L4-5; S1  . POSTERIOR LUMBAR FUSION  11/04/2014   L3-4  . SHOULDER ARTHROSCOPY W/ ROTATOR CUFF REPAIR Right 2012  . TUBAL LIGATION  1992    There were no vitals filed for this visit.   Subjective Assessment - 03/03/21 1152    Subjective Pt reports she is feeling better today. She is not feeling 100%  yet.    Pertinent History Lupus, lumbar fusion L2-S1 (4 surgeries)    Limitations Sitting;Walking;Standing    How long can you sit comfortably? recliner helps, if sitting straight 5 min    How long can you stand comfortably? back gets tired, 20 min    How long can you walk comfortably? it's always painful    Diagnostic tests lumbar MRI 2021: mild retrolisthesis L1/2 with some foraminal narrowing    Patient Stated Goals improve pain, will be new grandparents in April and hopes to be involved    Currently in Pain? Yes    Pain Score 2     Pain Location Hip    Pain Orientation Right    Pain Descriptors / Indicators Aching    Pain Type Chronic pain    Pain Onset More than a month ago                              [facility] Adult PT Treatment/Exercise - 03/03/21 0001      Knee/Hip Exercises: Aerobic   Nustep 6 min Lvl 6      Knee/Hip Exercises: Machines for Strengthening   Other Machine Shuttle: sidelying 50# x10 bilat, 62# x10      Knee/Hip Exercises: Standing   SLS with Vectors Slider forward + mini lunge 2x10 bilat with 1 hand rail; small toe tap forward on step x10    Other Standing Knee Exercises 2 hand holds, toe tap forward working on decreasing trendelenburg 2x10 bilat      Manual Therapy   Joint Mobilization R hip grade III inferior mobilization                    PT Short Term Goals - 02/08/21 1241      PT SHORT TERM GOAL #1   Title Pt will be ind with initial HEP    Time 3    Period Weeks    Status Achieved    Target Date 01/19/21      PT SHORT TERM GOAL #2   Title Pt will demo improved gait pattern to include improved weight shifting into Rt LE and reduce degree of Trendelenburg bil secondary to improved gluteal use in closed chain.    Time 4    Period Weeks    Status Partially Met    Target Date 01/26/21      PT SHORT TERM GOAL #3   Title Pt will report reduced pain with household ambulation by at least 30%    Time 4    Period Weeks    Status Achieved    Target Date 01/26/21      PT SHORT TERM GOAL #4   Title Pt will be able to sit in upright chair (vs recliner) for at least 15 mintues for mealtime with at least 30% less pain.    Baseline Was achieved but in the last week or so she has felt it again    Status On-going      PT SHORT TERM GOAL #5   Title Pt will demo 5x sit to stand without UE use in </= 13 sec    Baseline 15 sec, use of UEs; 9 sec no UEs 01/27/21    Time 4    Period Weeks    Status Achieved    Target Date 01/26/21             PT Long Term Goals - 02/22/21 1238  PT LONG TERM GOAL #1   Title Pt will report improved endurance for light ADLs and household ambulation for up to 20 min before  needing seated break.    Time 4    Period Weeks    Status On-going    Target Date 03/22/21      PT LONG TERM GOAL #2   Title Patient able to safely amb community distances using cane.    Time --    Period Weeks    Status On-going    Target Date 03/22/21      PT LONG TERM GOAL #3   Title Improved Bil hip strength to >= 4+/5 to improve overall function    Baseline bil hips 4/5    Time --    Period Weeks    Status On-going    Target Date 03/22/21      PT LONG TERM GOAL #4   Title Improved 5x sit to stand to </= 11 sec to reduce fall risk    Baseline 15 sec    Time 8    Period Weeks    Status On-going      PT LONG TERM GOAL #5   Title Improved FOTO to >/= 50% to demo improved function    Baseline 33%    Time --    Period Weeks    Status On-going    Target Date 03/22/21                 Plan - 03/03/21 1248    Clinical Impression Statement Treatment focused on continuing to improve hip abductor strength and neuromuscular control of glute med while coming into single leg weight bearing. Pt with improving lunge form. Manual therapy performed to reduce "cramping" sensation in R hip.    Personal Factors and Comorbidities Comorbidity 1;Comorbidity 2;Time since onset of injury/illness/exacerbation    Comorbidities lumbar fusion L2-S1, Lupus    Examination-Activity Limitations Locomotion Level;Sit;Lift;Stairs;Stand;Bend    Examination-Participation Restrictions Meal Prep;Laundry;Shop;Community Activity;Cleaning    Stability/Clinical Decision Making Stable/Uncomplicated    Rehab Potential Good    PT Frequency 2x / week    PT Duration 8 weeks    PT Treatment/Interventions ADLs/Self Care Home Management;Cryotherapy;Electrical Stimulation;Moist Heat;Iontophoresis 4mg /ml Dexamethasone;Gait training;Stair training;Functional mobility training;Therapeutic exercise;Therapeutic activities;Neuromuscular re-education;Balance training;Patient/family education;Energy conservation;Passive  range of motion;Manual techniques;Taping;Spinal Manipulations;Joint Manipulations    PT Next Visit Plan NuStep warm-up, gait activities for closed chain weight shifting (reduce trendelenburg pattern), core and hip strength as tol (continue standing hip abduction/upright skating, abductor machines, leg press). Progress lunges. Continue to progress single leg strength and stability.    PT Home Exercise Plan Access Code: [email or web address]    Consulted and Agree with Plan of Care Patient           Patient will benefit from skilled therapeutic intervention in order to improve the following deficits and impairments:  Abnormal gait,Decreased range of motion,Difficulty walking,Decreased endurance,Pain,Decreased activity tolerance,Impaired flexibility,Decreased balance,Decreased mobility,Decreased strength  Visit Diagnosis: Pain in right hip  Chronic bilateral low back pain, unspecified whether sciatica present  Muscle weakness (generalized)  Unsteadiness on feet     Problem List Patient Active Problem List   Diagnosis Date Noted  . Hypokalemia 01/12/2021  . Encounter for antineoplastic chemotherapy 11/18/2020  . Systemic lupus erythematosus ([place]) 11/03/2020  . Lupus nephritis ([place]) 11/03/2020  . Proteinuria 11/03/2020  . Other spondylosis with radiculopathy, lumbar region 12/09/2019  . Secondary adrenal insufficiency ([place]) 04/03/2019  . Metabolic acidosis, normal anion gap (NAG) 04/02/2019  . HTN (hypertension)   .  Adrenal insufficiency ([place]) 04/01/2019  . Hyponatremia 04/01/2019  . Hypothyroidism 04/01/2019  . AKI (acute kidney injury) ([place]) 04/01/2019  . Lumbar stenosis with neurogenic claudication 02/19/2017  . Migraine with aura and without status migrainosus, not intractable 07/11/2016  . Spondylolisthesis of lumbar region 11/04/2014  . Postconcussional syndrome 09/30/2013  . Post-concussion headache 09/30/2013  . Esophageal reflux 10/11/2011  . Benign neoplasm of colon 10/11/2011   . Gastritis, chronic 10/11/2011  . Guaiac + stool 09/28/2011  . IBS (irritable bowel syndrome) 09/28/2011  . GERD (gastroesophageal reflux disease) 09/28/2011  . Migraine syndrome 09/28/2011    [facility] April Ma L Coti Burd PT, DPT 03/03/2021, 12:51 PM  [facility] [address] [place], [place], [address] Phone: [phone]   Fax:  [phone]  Name: NATALIAH HATLESTAD MRN: [id number] Date of Birth: 06/13/1956

## 2021-03-08 ENCOUNTER — Ambulatory Visit (HOSPITAL_BASED_OUTPATIENT_CLINIC_OR_DEPARTMENT_OTHER): Payer: Medicare Other | Admitting: Physical Therapy

## 2021-03-08 ENCOUNTER — Other Ambulatory Visit: Payer: Self-pay

## 2021-03-08 DIAGNOSIS — M545 Low back pain, unspecified: Secondary | ICD-10-CM

## 2021-03-08 DIAGNOSIS — M25551 Pain in right hip: Secondary | ICD-10-CM | POA: Diagnosis not present

## 2021-03-08 DIAGNOSIS — M6281 Muscle weakness (generalized): Secondary | ICD-10-CM

## 2021-03-08 DIAGNOSIS — R2681 Unsteadiness on feet: Secondary | ICD-10-CM

## 2021-03-08 NOTE — Therapy (Signed)
[facility] [address] [place], [place], [address] Phone: [phone]   Fax:  [phone]  Physical Therapy Treatment  Patient Details  Name: Crystal Larsen MRN: [id number] Date of Birth: 1956/10/18 Referring Provider (PT): Rod Can, MD   Encounter Date: 03/08/2021   PT End of Session - 03/08/21 1149    Visit Number 19    Number of Visits 24    Date for PT Re-Evaluation 03/22/21    Authorization Type UHC Medicare    Progress Note Due on Visit 20    PT Start Time 1148    PT Stop Time 1230    PT Time Calculation (min) 42 min    Activity Tolerance Patient tolerated treatment well    Behavior During Therapy [facility] for tasks assessed/performed           Past Medical History:  Diagnosis Date  . Acute kidney injury ([place])   . Adrenal insufficiency ([place])   . Anemia   . Anxiety   . Arthritis    "left foot; left hand; right shoulder; back" (11/04/2014)  . Elevated liver enzymes   . Gallstones   . GERD (gastroesophageal reflux disease)   . History of gout   . HTN (hypertension)   . Hypothyroidism   . IBS (irritable bowel syndrome)    ?  [name] Lupus ([place]) 07/2019  . Metabolic acidosis   . Migraine    "maybe once/yr" (11/04/2014)  . Neuropathy    right leg  . Obesity   . Pancreatitis   . Pancreatitis   . PONV (postoperative nausea and vomiting) 1981 X 1   only time  . Seizures ([place]) 2003    due to tramadol reaction -- takes depakote for migraine prevention  . Sleep apnea    does not use cpap  . Spondylolisthesis of lumbar region   . Unsteady gait   . Wears glasses     Past Surgical History:  Procedure Laterality Date  . ANKLE GANGLION CYST EXCISION Right 1980's  . ANTERIOR LAT LUMBAR FUSION N/A 12/09/2019   Procedure: Lumbar One-Two Anterolateral lumbar decompression/interbody fusion with lateral plate;  Surgeon: Kristeen Miss, MD;  Location: [facility];  Service: Neurosurgery;  Laterality: N/A;  Lumbar One-Two  Anterolateral lumbar decompression/interbody fusion with lateral plate  . BACK SURGERY    . BREAST SURGERY     biopsy  . BUNIONECTOMY Left 2012  . CARPAL TUNNEL RELEASE Right 05/22/2019   Procedure: RIGHT CARPAL TUNNEL RELEASE;  Surgeon: Daryll Brod, MD;  Location: [facility];  Service: Orthopedics;  Laterality: Right;  . CARPOMETACARPEL SUSPENSION PLASTY Right 05/22/2019   Procedure: ARTHROPLASY CARPOMETACARPAL RIGHT THUMB TRAPEZIUM EXCISION TENDON TRANSFER MICRO LINK SUSPENSION;  Surgeon: Daryll Brod, MD;  Location: [place];  Service: Orthopedics;  Laterality: Right;  . CESAREAN SECTION  1986  . COLONOSCOPY W/ BIOPSIES AND POLYPECTOMY    . EYE SURGERY Bilateral 2012   "lasered a hole in my iris cause pressure was building up"  . LAPAROSCOPIC CHOLECYSTECTOMY  1990's  . NASAL SINUS SURGERY  1980's  . POSTERIOR LUMBAR FUSION  05/2013   L4-5; S1  . POSTERIOR LUMBAR FUSION  11/04/2014   L3-4  . SHOULDER ARTHROSCOPY W/ ROTATOR CUFF REPAIR Right 2012  . TUBAL LIGATION  1992    There were no vitals filed for this visit.   Subjective Assessment - 03/08/21 1149    Subjective Pt went to a 60th anniversary party this weekend. Pt states she  is feeling a lot better this week.    Pertinent History Lupus, lumbar fusion L2-S1 (4 surgeries)    Limitations Sitting;Walking;Standing    How long can you sit comfortably? recliner helps, if sitting straight 5 min    How long can you stand comfortably? back gets tired, 20 min    How long can you walk comfortably? it's always painful    Diagnostic tests lumbar MRI 2021: mild retrolisthesis L1/2 with some foraminal narrowing    Patient Stated Goals improve pain, will be new grandparents in April and hopes to be involved    Currently in Pain? 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   Exercises Knee/Hip      Knee/Hip Exercises: Aerobic   Nustep 5 min Lvl 6      Knee/Hip Exercises: Standing   Other Standing Knee Exercises 2 hand holds, heel tap forward working on decreasing trendelenburg 2x10 bilat    Other Standing Knee Exercises Fire hydrant orange tband 3x8      Knee/Hip Exercises: Sidelying   Other Sidelying Knee/Hip Exercises Side plank 2x10 sec bilat      Knee/Hip Exercises: Prone   Other Prone Exercises 2x10 fire hydrant quadruped orange tband                    PT Short Term Goals - 02/08/21 1241      PT SHORT TERM GOAL #1   Title Pt will be ind with initial HEP    Time 3    Period Weeks    Status Achieved    Target Date 01/19/21      PT SHORT TERM GOAL #2   Title Pt will demo improved gait pattern to include improved weight shifting into Rt LE and reduce degree of Trendelenburg bil secondary to improved gluteal use in closed chain.    Time 4    Period Weeks    Status Partially Met    Target Date 01/26/21      PT SHORT TERM GOAL #3   Title Pt will report reduced pain with household ambulation by at least 30%    Time 4    Period Weeks    Status Achieved    Target Date 01/26/21      PT SHORT TERM GOAL #4   Title Pt will be able to sit in upright chair (vs recliner) for at least 15 mintues for mealtime with at least 30% less pain.    Baseline Was achieved but in the last week or so she has felt it again    Status On-going      PT SHORT TERM GOAL #5   Title Pt will demo 5x sit to stand without UE use in </= 13 sec    Baseline 15 sec, use of UEs; 9 sec no UEs 01/27/21    Time 4    Period Weeks  Status Achieved    Target Date 01/26/21             PT Long Term Goals - 02/22/21  1238      PT LONG TERM GOAL #1   Title Pt will report improved endurance for light ADLs and household ambulation for up to 20 min before needing seated break.    Time 4    Period Weeks    Status On-going    Target Date 03/22/21      PT LONG TERM GOAL #2   Title Patient able to safely amb community distances using cane.    Time --    Period Weeks    Status On-going    Target Date 03/22/21      PT LONG TERM GOAL #3   Title Improved Bil hip strength to >= 4+/5 to improve overall function    Baseline bil hips 4/5    Time --    Period Weeks    Status On-going    Target Date 03/22/21      PT LONG TERM GOAL #4   Title Improved 5x sit to stand to </= 11 sec to reduce fall risk    Baseline 15 sec    Time 8    Period Weeks    Status On-going      PT LONG TERM GOAL #5   Title Improved FOTO to >/= 50% to demo improved function    Baseline 33%    Time --    Period Weeks    Status On-going    Target Date 03/22/21                 Plan - 03/08/21 1149    Clinical Impression Statement Treatment focused on continuing to improve hip abductor and core strength as well as neuromuscular control of glute med while coming into single leg weight bearing    Personal Factors and Comorbidities Comorbidity 1;Comorbidity 2;Time since onset of injury/illness/exacerbation    Comorbidities lumbar fusion L2-S1, Lupus    Examination-Activity Limitations Locomotion Level;Sit;Lift;Stairs;Stand;Bend    Examination-Participation Restrictions Meal Prep;Laundry;Shop;Community Activity;Cleaning    Stability/Clinical Decision Making Stable/Uncomplicated    Rehab Potential Good    PT Frequency 2x / week    PT Duration 8 weeks    PT Treatment/Interventions ADLs/Self Care Home Management;Cryotherapy;Electrical Stimulation;Moist Heat;Iontophoresis 42m/ml Dexamethasone;Gait training;Stair training;Functional mobility training;Therapeutic exercise;Therapeutic activities;Neuromuscular re-education;Balance  training;Patient/family education;Energy conservation;Passive range of motion;Manual techniques;Taping;Spinal Manipulations;Joint Manipulations    PT Next Visit Plan NuStep warm-up, gait activities for closed chain weight shifting (reduce trendelenburg pattern), core and hip strength as tol (continue standing hip abduction/upright skating, abductor machines, leg press). Progress lunges. Continue to progress single leg strength and stability.    PT Home Exercise Plan Access Code: [email or web address]   Consulted and Agree with Plan of Care Patient           Patient will benefit from skilled therapeutic intervention in order to improve the following deficits and impairments:  Abnormal gait,Decreased range of motion,Difficulty walking,Decreased endurance,Pain,Decreased activity tolerance,Impaired flexibility,Decreased balance,Decreased mobility,Decreased strength  Visit Diagnosis: Pain in right hip  Chronic bilateral low back pain, unspecified whether sciatica present  Muscle weakness (generalized)  Unsteadiness on feet     Problem List Patient Active Problem List   Diagnosis Date Noted  . Hypokalemia 01/12/2021  . Encounter for antineoplastic chemotherapy 11/18/2020  . [place]ic [place]mat[place] 11/03/2020  . Lupus nephritis ([place] 11/03/2020  . Proteinuria 11/03/2020  . Other spondylosis with radiculopathy, lumbar region  12/09/2019  . Secondary adrenal insufficiency ([place]) 04/03/2019  . Metabolic acidosis, normal anion gap (NAG) 04/02/2019  . HTN (hypertension)   . Adrenal insufficiency ([place]) 04/01/2019  . Hyponatremia 04/01/2019  . Hypothyroidism 04/01/2019  . AKI (acute kidney injury) ([place]) 04/01/2019  . Lumbar stenosis with neurogenic claudication 02/19/2017  . Migraine with aura and without status migrainosus, not intractable 07/11/2016  . Spondylolisthesis of lumbar region 11/04/2014  . Postconcussional syndrome 09/30/2013  . Post-concussion headache 09/30/2013  .  Esophageal reflux 10/11/2011  . Benign neoplasm of colon 10/11/2011  . Gastritis, chronic 10/11/2011  . Guaiac + stool 09/28/2011  . IBS (irritable bowel syndrome) 09/28/2011  . GERD (gastroesophageal reflux disease) 09/28/2011  . Migraine syndrome 09/28/2011    [facility] [address] PT, DPT 03/08/2021, 1:13 PM  [facility] [place], [place], [address] Phone: [phone]   Fax:  [phone]  Name: Crystal Larsen MRN: [id number] Date of Birth: September 10, 1956

## 2021-03-09 ENCOUNTER — Ambulatory Visit (HOSPITAL_COMMUNITY)
Admission: RE | Admit: 2021-03-09 | Discharge: 2021-03-09 | Disposition: A | Discharge status: Home / Self Care | Payer: Medicare Other | Source: Ambulatory Visit | Attending: Pulmonary Disease | Admitting: Pulmonary Disease

## 2021-03-09 DIAGNOSIS — M3214 Glomerular disease in systemic lupus erythematosus: Secondary | ICD-10-CM | POA: Diagnosis not present

## 2021-03-09 MED ORDER — FAMOTIDINE IN NACL 20-0.9 MG/50ML-% IV SOLN: 20.0000 mg | Freq: Once | INTRAVENOUS | Status: DC | PRN

## 2021-03-09 MED ORDER — EPINEPHRINE 0.3 MG/0.3ML IJ SOAJ: 0.3000 mg | Freq: Once | INTRAMUSCULAR | Status: DC | PRN

## 2021-03-09 MED ORDER — DIPHENHYDRAMINE HCL 50 MG/ML IJ SOLN
50.0000 mg | Freq: Once | INTRAMUSCULAR | Status: DC | PRN
Start: 2021-03-09 — End: 2021-03-10

## 2021-03-09 MED ORDER — CILGAVIMAB (PART OF EVUSHELD) INJECTION
300.0000 mg | Freq: Once | INTRAMUSCULAR | Status: AC
  Administered 2021-03-09: 300 mg via INTRAMUSCULAR
  Filled 2021-03-09: qty 3

## 2021-03-09 MED ORDER — TIXAGEVIMAB (PART OF EVUSHELD) INJECTION
300.0000 mg | Freq: Once | INTRAMUSCULAR | Status: AC
  Administered 2021-03-09: 300 mg via INTRAMUSCULAR
  Filled 2021-03-09: qty 3

## 2021-03-09 MED ORDER — ALBUTEROL SULFATE HFA 108 (90 BASE) MCG/ACT IN AERS: 2.0000 | INHALATION_SPRAY | Freq: Once | RESPIRATORY_TRACT | Status: DC | PRN

## 2021-03-09 MED ORDER — METHYLPREDNISOLONE SODIUM SUCC 125 MG IJ SOLR: 125.0000 mg | Freq: Once | INTRAMUSCULAR | Status: DC | PRN

## 2021-03-10 ENCOUNTER — Ambulatory Visit (HOSPITAL_BASED_OUTPATIENT_CLINIC_OR_DEPARTMENT_OTHER): Payer: Medicare Other | Admitting: Physical Therapy

## 2021-03-15 ENCOUNTER — Other Ambulatory Visit: Payer: Self-pay

## 2021-03-15 ENCOUNTER — Ambulatory Visit (HOSPITAL_BASED_OUTPATIENT_CLINIC_OR_DEPARTMENT_OTHER): Payer: Medicare Other | Admitting: Physical Therapy

## 2021-03-15 DIAGNOSIS — G8929 Other chronic pain: Secondary | ICD-10-CM

## 2021-03-15 DIAGNOSIS — M6281 Muscle weakness (generalized): Secondary | ICD-10-CM

## 2021-03-15 DIAGNOSIS — M25551 Pain in right hip: Secondary | ICD-10-CM | POA: Diagnosis not present

## 2021-03-15 DIAGNOSIS — R2681 Unsteadiness on feet: Secondary | ICD-10-CM

## 2021-03-15 NOTE — Therapy (Addendum)
[facility] [place], [place], [address] Phone: [phone]   Fax:  [phone]  Physical Therapy Treatment and Progress Note  Patient Details  Name: Crystal Larsen MRN: [id number] Date of Birth: Apr 01, 1956 Referring Provider (PT): Rod Can, MD  Progress Note Reporting Period 12/29/20 to 03/15/21  See note below for Objective Data and Assessment of Progress/Goals.       Encounter Date: 03/15/2021   PT End of Session - 03/15/21 1527    Visit Number 20    Number of Visits 24    Date for PT Re-Evaluation 03/22/21    Authorization Type UHC Medicare    Progress Note Due on Visit 20    PT Start Time 1520    PT Stop Time 1600    PT Time Calculation (min) 40 min    Activity Tolerance Patient tolerated treatment well    Behavior During Therapy WFL for tasks assessed/performed           Past Medical History:  Diagnosis Date  . Acute kidney injury ([place])   . Adrenal insufficiency ([place])   . Anemia   . Anxiety   . Arthritis    "left foot; left hand; right shoulder; back" (11/04/2014)  . Elevated liver enzymes   . Gallstones   . GERD (gastroesophageal reflux disease)   . History of gout   . HTN (hypertension)   . Hypothyroidism   . IBS (irritable bowel syndrome)    ?  [name] Lupus ([place]) 07/2019  . Metabolic acidosis   . Migraine    "maybe once/yr" (11/04/2014)  . Neuropathy    right leg  . Obesity   . Pancreatitis   . Pancreatitis   . PONV (postoperative nausea and vomiting) 1981 X 1   only time  . Seizures ([place]) 2003    due to tramadol reaction -- takes depakote for migraine prevention  . Sleep apnea    does not use cpap  . Spondylolisthesis of lumbar region   . Unsteady gait   . Wears glasses     Past Surgical History:  Procedure Laterality Date  . ANKLE GANGLION CYST EXCISION Right 1980's  . ANTERIOR LAT LUMBAR FUSION N/A 12/09/2019   Procedure: Lumbar One-Two Anterolateral lumbar  decompression/interbody fusion with lateral plate;  Surgeon: Kristeen Miss, MD;  Location: [facility];  Service: Neurosurgery;  Laterality: N/A;  Lumbar One-Two Anterolateral lumbar decompression/interbody fusion with lateral plate  . BACK SURGERY    . BREAST SURGERY     biopsy  . BUNIONECTOMY Left 2012  . CARPAL TUNNEL RELEASE Right 05/22/2019   Procedure: RIGHT CARPAL TUNNEL RELEASE;  Surgeon: Daryll Brod, MD;  Location: [facility];  Service: Orthopedics;  Laterality: Right;  . CARPOMETACARPEL SUSPENSION PLASTY Right 05/22/2019   Procedure: ARTHROPLASY CARPOMETACARPAL RIGHT THUMB TRAPEZIUM EXCISION TENDON TRANSFER MICRO LINK SUSPENSION;  Surgeon: Daryll Brod, MD;  Location: [facility];  Service: Orthopedics;  Laterality: Right;  . CESAREAN SECTION  1986  . COLONOSCOPY W/ BIOPSIES AND POLYPECTOMY    . EYE SURGERY Bilateral 2012   "lasered a hole in my iris cause pressure was building up"  . LAPAROSCOPIC CHOLECYSTECTOMY  1990's  . NASAL SINUS SURGERY  1980's  . POSTERIOR LUMBAR FUSION  05/2013   L4-5; S1  . POSTERIOR LUMBAR FUSION  11/04/2014   L3-4  . SHOULDER ARTHROSCOPY W/ ROTATOR CUFF REPAIR Right 2012  . TUBAL LIGATION  1992    There were no vitals filed  for this visit.   Subjective Assessment - 03/15/21 1526    Subjective Pt went to a 60th anniversary party this weekend. Pt states she is feeling a lot better this week.    Pertinent History Lupus, lumbar fusion L2-S1 (4 surgeries)    Limitations Sitting;Walking;Standing    How long can you sit comfortably? recliner helps, if sitting straight 5 min    How long can you stand comfortably? back gets tired, 20 min    How long can you walk comfortably? it's always painful    Diagnostic tests lumbar MRI 2021: mild retrolisthesis L1/2 with some foraminal narrowing    Patient Stated Goals improve pain, will be new grandparents in April and hopes to be involved    Currently in Pain? Yes    Pain Score 3     Pain  Location Hip    Pain Orientation Right    Pain Descriptors / Indicators Aching    Pain Onset More than a month ago                             [facility] Adult PT Treatment/Exercise - 03/15/21 0001      Ambulation/Gait   Ambulation Distance (Feet) 50 Feet    Gait Pattern Trendelenburg;Lateral hip instability    Ambulation Surface Level;Indoor    Pre-Gait Activities Heel strike with quad activation, stance phase with glute activation to decrease trendelenburg    Gait Comments Working to improve glute med firing and neuromuscular control      Knee/Hip Exercises: Stretches   Hip Flexor Stretch Right;Left;30 seconds    Piriformis Stretch Right;Left;30 seconds    Other Knee/Hip Stretches Figure 4 stretch x30 sec bilat      Knee/Hip Exercises: Aerobic   Nustep 6 min lvl 6      Knee/Hip Exercises: Machines for Strengthening   Other Machine Shuttle: sidelying 75# 2x10 bilat;      Knee/Hip Exercises: Sidelying   Other Sidelying Knee/Hip Exercises Side plank 3x10 sec bilat                    PT Short Term Goals - 02/08/21 1241      PT SHORT TERM GOAL #1   Title Pt will be ind with initial HEP    Time 3    Period Weeks    Status Achieved    Target Date 01/19/21      PT SHORT TERM GOAL #2   Title Pt will demo improved gait pattern to include improved weight shifting into Rt LE and reduce degree of Trendelenburg bil secondary to improved gluteal use in closed chain.    Time 4    Period Weeks    Status Partially Met    Target Date 01/26/21      PT SHORT TERM GOAL #3   Title Pt will report reduced pain with household ambulation by at least 30%    Time 4    Period Weeks    Status Achieved    Target Date 01/26/21      PT SHORT TERM GOAL #4   Title Pt will be able to sit in upright chair (vs recliner) for at least 15 mintues for mealtime with at least 30% less pain.    Baseline Was achieved but in the last week or so she has felt it again    Status  On-going      PT SHORT TERM GOAL #5  Title Pt will demo 5x sit to stand without UE use in </= 13 sec    Baseline 15 sec, use of UEs; 9 sec no UEs 01/27/21    Time 4    Period Weeks    Status Achieved    Target Date 01/26/21             PT Long Term Goals - 02/22/21 1238      PT LONG TERM GOAL #1   Title Pt will report improved endurance for light ADLs and household ambulation for up to 20 min before needing seated break.    Time 4    Period Weeks    Status On-going    Target Date 03/22/21      PT LONG TERM GOAL #2   Title Patient able to safely amb community distances using cane.    Time --    Period Weeks    Status On-going    Target Date 03/22/21      PT LONG TERM GOAL #3   Title Improved Bil hip strength to >= 4+/5 to improve overall function    Baseline bil hips 4/5    Time --    Period Weeks    Status On-going    Target Date 03/22/21      PT LONG TERM GOAL #4   Title Improved 5x sit to stand to </= 11 sec to reduce fall risk    Baseline 15 sec    Time 8    Period Weeks    Status On-going      PT LONG TERM GOAL #5   Title Improved FOTO to >/= 50% to demo improved function    Baseline 33%    Time --    Period Weeks    Status On-going    Target Date 03/22/21                 Plan - 03/15/21 1656    Clinical Impression Statement Continued focus on hip strengthening and gait training to decrease trendelenburg pattern.    Personal Factors and Comorbidities Comorbidity 1;Comorbidity 2;Time since onset of injury/illness/exacerbation    Comorbidities lumbar fusion L2-S1, Lupus    Examination-Activity Limitations Locomotion Level;Sit;Lift;Stairs;Stand;Bend    Examination-Participation Restrictions Meal Prep;Laundry;Shop;Community Activity;Cleaning    Stability/Clinical Decision Making Stable/Uncomplicated    Rehab Potential Good    PT Frequency 2x / week    PT Duration 8 weeks    PT Treatment/Interventions ADLs/Self Care Home  Management;Cryotherapy;Electrical Stimulation;Moist Heat;Iontophoresis 4mg /ml Dexamethasone;Gait training;Stair training;Functional mobility training;Therapeutic exercise;Therapeutic activities;Neuromuscular re-education;Balance training;Patient/family education;Energy conservation;Passive range of motion;Manual techniques;Taping;Spinal Manipulations;Joint Manipulations    PT Next Visit Plan NuStep warm-up, gait activities for closed chain weight shifting (reduce trendelenburg pattern), core and hip strength as tol (currently working in side planks and lunges). Continue to progress single leg strength and stability.    PT Home Exercise Plan Access Code: [email or web address]    Consulted and Agree with Plan of Care Patient           Patient will benefit from skilled therapeutic intervention in order to improve the following deficits and impairments:  Abnormal gait,Decreased range of motion,Difficulty walking,Decreased endurance,Pain,Decreased activity tolerance,Impaired flexibility,Decreased balance,Decreased mobility,Decreased strength  Visit Diagnosis: Pain in right hip  Chronic bilateral low back pain, unspecified whether sciatica present  Muscle weakness (generalized)  Unsteadiness on feet     Problem List Patient Active Problem List   Diagnosis Date Noted  . Hypokalemia 01/12/2021  . Encounter for antineoplastic chemotherapy 11/18/2020  .  Systemic lupus erythematosus ([place]) 11/03/2020  . Lupus nephritis ([place]) 11/03/2020  . Proteinuria 11/03/2020  . Other spondylosis with radiculopathy, lumbar region 12/09/2019  . Secondary adrenal insufficiency ([place]) 04/03/2019  . Metabolic acidosis, normal anion gap (NAG) 04/02/2019  . HTN (hypertension)   . Adrenal insufficiency ([place]) 04/01/2019  . Hyponatremia 04/01/2019  . Hypothyroidism 04/01/2019  . AKI (acute kidney injury) ([place]) 04/01/2019  . Lumbar stenosis with neurogenic claudication 02/19/2017  . Migraine with aura and without status  migrainosus, not intractable 07/11/2016  . Spondylolisthesis of lumbar region 11/04/2014  . Postconcussional syndrome 09/30/2013  . Post-concussion headache 09/30/2013  . Esophageal reflux 10/11/2011  . Benign neoplasm of colon 10/11/2011  . Gastritis, chronic 10/11/2011  . Guaiac + stool 09/28/2011  . IBS (irritable bowel syndrome) 09/28/2011  . GERD (gastroesophageal reflux disease) 09/28/2011  . Migraine syndrome 09/28/2011    [facility] [address] PT, DPT 03/15/2021, 4:58 PM  [facility] [place], [place], [address] Phone: [phone]   Fax:  [phone]  Name: BERLYNN WARSAME MRN: [id number] Date of Birth: 12-06-1955

## 2021-03-17 ENCOUNTER — Ambulatory Visit (HOSPITAL_BASED_OUTPATIENT_CLINIC_OR_DEPARTMENT_OTHER): Payer: Medicare Other | Admitting: Physical Therapy

## 2021-03-17 ENCOUNTER — Other Ambulatory Visit: Payer: Self-pay

## 2021-03-17 DIAGNOSIS — M25551 Pain in right hip: Secondary | ICD-10-CM | POA: Diagnosis not present

## 2021-03-17 DIAGNOSIS — G8929 Other chronic pain: Secondary | ICD-10-CM

## 2021-03-17 DIAGNOSIS — M6281 Muscle weakness (generalized): Secondary | ICD-10-CM

## 2021-03-17 DIAGNOSIS — R2681 Unsteadiness on feet: Secondary | ICD-10-CM

## 2021-03-17 DIAGNOSIS — M545 Low back pain, unspecified: Secondary | ICD-10-CM

## 2021-03-17 NOTE — Therapy (Signed)
[facility] [address] [place], [place], [address] Phone: [phone]   Fax:  [phone]  Physical Therapy Treatment  Patient Details  Name: Crystal Larsen MRN: [id number] Date of Birth: May 16, 1956 Referring Provider (PT): Rod Can, MD   Encounter Date: 03/17/2021   PT End of Session - 03/17/21 1150    Visit Number 21    Number of Visits 24    Date for PT Re-Evaluation 03/22/21    Authorization Type UHC Medicare    Progress Note Due on Visit 20    PT Start Time 1150    PT Stop Time 1230    PT Time Calculation (min) 40 min    Activity Tolerance Patient tolerated treatment well    Behavior During Therapy [facility] for tasks assessed/performed           Past Medical History:  Diagnosis Date  . Acute kidney injury ([place])   . Adrenal insufficiency ([place])   . Anemia   . Anxiety   . Arthritis    "left foot; left hand; right shoulder; back" (11/04/2014)  . Elevated liver enzymes   . Gallstones   . GERD (gastroesophageal reflux disease)   . History of gout   . HTN (hypertension)   . Hypothyroidism   . IBS (irritable bowel syndrome)    ?  [name] Lupus ([place]) 07/2019  . Metabolic acidosis   . Migraine    "maybe once/yr" (11/04/2014)  . Neuropathy    right leg  . Obesity   . Pancreatitis   . Pancreatitis   . PONV (postoperative nausea and vomiting) 1981 X 1   only time  . Seizures ([place]) 2003    due to tramadol reaction -- takes depakote for migraine prevention  . Sleep apnea    does not use cpap  . Spondylolisthesis of lumbar region   . Unsteady gait   . Wears glasses     Past Surgical History:  Procedure Laterality Date  . ANKLE GANGLION CYST EXCISION Right 1980's  . ANTERIOR LAT LUMBAR FUSION N/A 12/09/2019   Procedure: Lumbar One-Two Anterolateral lumbar decompression/interbody fusion with lateral plate;  Surgeon: Kristeen Miss, MD;  Location: [facility];  Service: Neurosurgery;  Laterality: N/A;  Lumbar One-Two  Anterolateral lumbar decompression/interbody fusion with lateral plate  . BACK SURGERY    . BREAST SURGERY     biopsy  . BUNIONECTOMY Left 2012  . CARPAL TUNNEL RELEASE Right 05/22/2019   Procedure: RIGHT CARPAL TUNNEL RELEASE;  Surgeon: Daryll Brod, MD;  Location: [facility];  Service: Orthopedics;  Laterality: Right;  . CARPOMETACARPEL SUSPENSION PLASTY Right 05/22/2019   Procedure: ARTHROPLASY CARPOMETACARPAL RIGHT THUMB TRAPEZIUM EXCISION TENDON TRANSFER MICRO LINK SUSPENSION;  Surgeon: Daryll Brod, MD;  Location: [facility];  Service: Orthopedics;  Laterality: Right;  . CESAREAN SECTION  1986  . COLONOSCOPY W/ BIOPSIES AND POLYPECTOMY    . EYE SURGERY Bilateral 2012   "lasered a hole in my iris cause pressure was building up"  . LAPAROSCOPIC CHOLECYSTECTOMY  1990's  . NASAL SINUS SURGERY  1980's  . POSTERIOR LUMBAR FUSION  05/2013   L4-5; S1  . POSTERIOR LUMBAR FUSION  11/04/2014   L3-4  . SHOULDER ARTHROSCOPY W/ ROTATOR CUFF REPAIR Right 2012  . TUBAL LIGATION  1992    There were no vitals filed for this visit.                      [facility] Adult  PT Treatment/Exercise - 03/17/21 0001      Ambulation/Gait   Ambulation Distance (Feet) 50 Feet    Gait Pattern Trendelenburg;Lateral hip instability    Ambulation Surface Level;Indoor    Pre-Gait Activities heel strike with knee extension    Gait Comments attempted without bilat hand hold in // bars; however, pt with increased trendelenburg      Knee/Hip Exercises: Aerobic   Nustep 6 min lvl 6      Knee/Hip Exercises: Standing   Forward Step Up Right;Left;Hand Hold: 1;Step Height: 6";5 reps   attempted;   Other Standing Knee Exercises Palloff press 2x10 bilat      Knee/Hip Exercises: Seated   Sit to Sand without UE support;10 reps;1 set   adductor squeeze     Knee/Hip Exercises: Prone   Other Prone Exercises 3x10 fire hydrant quadruped red tband      Manual Therapy   Joint  Mobilization R hip grade III inferior mobilization                    PT Short Term Goals - 02/08/21 1241      PT SHORT TERM GOAL #1   Title Pt will be ind with initial HEP    Time 3    Period Weeks    Status Achieved    Target Date 01/19/21      PT SHORT TERM GOAL #2   Title Pt will demo improved gait pattern to include improved weight shifting into Rt LE and reduce degree of Trendelenburg bil secondary to improved gluteal use in closed chain.    Time 4    Period Weeks    Status Partially Met    Target Date 01/26/21      PT SHORT TERM GOAL #3   Title Pt will report reduced pain with household ambulation by at least 30%    Time 4    Period Weeks    Status Achieved    Target Date 01/26/21      PT SHORT TERM GOAL #4   Title Pt will be able to sit in upright chair (vs recliner) for at least 15 mintues for mealtime with at least 30% less pain.    Baseline Was achieved but in the last week or so she has felt it again    Status On-going      PT SHORT TERM GOAL #5   Title Pt will demo 5x sit to stand without UE use in </= 13 sec    Baseline 15 sec, use of UEs; 9 sec no UEs 01/27/21    Time 4    Period Weeks    Status Achieved    Target Date 01/26/21             PT Long Term Goals - 02/22/21 1238      PT LONG TERM GOAL #1   Title Pt will report improved endurance for light ADLs and household ambulation for up to 20 min before needing seated break.    Time 4    Period Weeks    Status On-going    Target Date 03/22/21      PT LONG TERM GOAL #2   Title Patient able to safely amb community distances using cane.    Time --    Period Weeks    Status On-going    Target Date 03/22/21      PT LONG TERM GOAL #3   Title Improved Bil hip strength to >= 4+/5 to  improve overall function    Baseline bil hips 4/5    Time --    Period Weeks    Status On-going    Target Date 03/22/21      PT LONG TERM GOAL #4   Title Improved 5x sit to stand to </= 11 sec to reduce  fall risk    Baseline 15 sec    Time 8    Period Weeks    Status On-going      PT LONG TERM GOAL #5   Title Improved FOTO to >/= 50% to demo improved function    Baseline 33%    Time --    Period Weeks    Status On-going    Target Date 03/22/21                 Plan - 03/17/21 1152    Clinical Impression Statement Continued focus on hip strengthening and gait training to decrease trendelenburg pattern. Incorporating more exercises for trunk and core stability/strength as this might also be contributing to her gait instability. Manual therapy to address pt's R hip/groin pain.    Personal Factors and Comorbidities Comorbidity 1;Comorbidity 2;Time since onset of injury/illness/exacerbation    Comorbidities lumbar fusion L2-S1, Lupus    Examination-Activity Limitations Locomotion Level;Sit;Lift;Stairs;Stand;Bend    Examination-Participation Restrictions Meal Prep;Laundry;Shop;Community Activity;Cleaning    Stability/Clinical Decision Making Stable/Uncomplicated    Rehab Potential Good    PT Frequency 2x / week    PT Duration 8 weeks    PT Treatment/Interventions ADLs/Self Care Home Management;Cryotherapy;Electrical Stimulation;Moist Heat;Iontophoresis 4mg /ml Dexamethasone;Gait training;Stair training;Functional mobility training;Therapeutic exercise;Therapeutic activities;Neuromuscular re-education;Balance training;Patient/family education;Energy conservation;Passive range of motion;Manual techniques;Taping;Spinal Manipulations;Joint Manipulations    PT Next Visit Plan NuStep warm-up, gait activities for closed chain weight shifting (reduce trendelenburg pattern), core and hip strength as tol (currently working in side planks, paloff press, and lunges). Continue to progress single leg strength and stability.    PT Home Exercise Plan Access Code: [email or web address]    Consulted and Agree with Plan of Care Patient           Patient will benefit from skilled therapeutic intervention in order  to improve the following deficits and impairments:  Abnormal gait,Decreased range of motion,Difficulty walking,Decreased endurance,Pain,Decreased activity tolerance,Impaired flexibility,Decreased balance,Decreased mobility,Decreased strength  Visit Diagnosis: Pain in right hip  Chronic bilateral low back pain, unspecified whether sciatica present  Muscle weakness (generalized)  Unsteadiness on feet     Problem List Patient Active Problem List   Diagnosis Date Noted  . Hypokalemia 01/12/2021  . Encounter for antineoplastic chemotherapy 11/18/2020  . Systemic lupus erythematosus ([place]) 11/03/2020  . Lupus nephritis ([place]) 11/03/2020  . Proteinuria 11/03/2020  . Other spondylosis with radiculopathy, lumbar region 12/09/2019  . Secondary adrenal insufficiency ([place]) 04/03/2019  . Metabolic acidosis, normal anion gap (NAG) 04/02/2019  . HTN (hypertension)   . Adrenal insufficiency ([place]) 04/01/2019  . Hyponatremia 04/01/2019  . Hypothyroidism 04/01/2019  . AKI (acute kidney injury) ([place]) 04/01/2019  . Lumbar stenosis with neurogenic claudication 02/19/2017  . Migraine with aura and without status migrainosus, not intractable 07/11/2016  . Spondylolisthesis of lumbar region 11/04/2014  . Postconcussional syndrome 09/30/2013  . Post-concussion headache 09/30/2013  . Esophageal reflux 10/11/2011  . Benign neoplasm of colon 10/11/2011  . Gastritis, chronic 10/11/2011  . Guaiac + stool 09/28/2011  . IBS (irritable bowel syndrome) 09/28/2011  . GERD (gastroesophageal reflux disease) 09/28/2011  . Migraine syndrome 09/28/2011    Crystal Larsen April Ma L Rodrigues [place] PT, DPT 03/17/2021, 12:48  PM  [facility] [place], [place], [address] Phone: [phone]   Fax:  [phone]  Name: ELEXIUS MINAR MRN: [id number] Date of Birth: [birth year]

## 2021-03-22 ENCOUNTER — Other Ambulatory Visit: Payer: Self-pay

## 2021-03-22 ENCOUNTER — Ambulatory Visit (HOSPITAL_BASED_OUTPATIENT_CLINIC_OR_DEPARTMENT_OTHER): Payer: Medicare Other | Admitting: Physical Therapy

## 2021-03-22 ENCOUNTER — Encounter (HOSPITAL_BASED_OUTPATIENT_CLINIC_OR_DEPARTMENT_OTHER): Payer: Self-pay | Admitting: Physical Therapy

## 2021-03-22 DIAGNOSIS — M25551 Pain in right hip: Secondary | ICD-10-CM

## 2021-03-22 DIAGNOSIS — R2681 Unsteadiness on feet: Secondary | ICD-10-CM

## 2021-03-22 DIAGNOSIS — M545 Low back pain, unspecified: Secondary | ICD-10-CM

## 2021-03-22 DIAGNOSIS — M6281 Muscle weakness (generalized): Secondary | ICD-10-CM

## 2021-03-22 NOTE — Therapy (Signed)
[facility] [place], [place], [address] Phone: [phone]   Fax:  [phone]  Physical Therapy Treatment Progress Note Reporting Period 12/29/20 to 03/22/2021   See note below for Objective Data and Assessment of Progress/Goals.       Patient Details  Name: Crystal Larsen MRN: [id number] Date of Birth: 1956/02/02 Referring Provider (PT): Rod Can, MD   Encounter Date: 03/22/2021   PT End of Session - 03/22/21 1347    Visit Number 22    Number of Visits 24    Date for PT Re-Evaluation 05/06/21    Authorization Type UHC Medicare    Progress Note Due on Visit 30    PT Start Time 1348    PT Stop Time 1431    PT Time Calculation (min) 43 min    Activity Tolerance Patient tolerated treatment well    Behavior During Therapy [facility] for tasks assessed/performed           Past Medical History:  Diagnosis Date  . Acute kidney injury ([place])   . Adrenal insufficiency ([place])   . Anemia   . Anxiety   . Arthritis    "left foot; left hand; right shoulder; back" (11/04/2014)  . Elevated liver enzymes   . Gallstones   . GERD (gastroesophageal reflux disease)   . History of gout   . HTN (hypertension)   . Hypothyroidism   . IBS (irritable bowel syndrome)    ?  [name] Lupus ([place]) 07/2019  . Metabolic acidosis   . Migraine    "maybe once/yr" (11/04/2014)  . Neuropathy    right leg  . Obesity   . Pancreatitis   . Pancreatitis   . PONV (postoperative nausea and vomiting) 1981 X 1   only time  . Seizures ([place]) 2003    due to tramadol reaction -- takes depakote for migraine prevention  . Sleep apnea    does not use cpap  . Spondylolisthesis of lumbar region   . Unsteady gait   . Wears glasses     Past Surgical History:  Procedure Laterality Date  . ANKLE GANGLION CYST EXCISION Right 1980's  . ANTERIOR LAT LUMBAR FUSION N/A 12/09/2019   Procedure: Lumbar One-Two Anterolateral lumbar  decompression/interbody fusion with lateral plate;  Surgeon: Kristeen Miss, MD;  Location: [facility];  Service: Neurosurgery;  Laterality: N/A;  Lumbar One-Two Anterolateral lumbar decompression/interbody fusion with lateral plate  . BACK SURGERY    . BREAST SURGERY     biopsy  . BUNIONECTOMY Left 2012  . CARPAL TUNNEL RELEASE Right 05/22/2019   Procedure: RIGHT CARPAL TUNNEL RELEASE;  Surgeon: Daryll Brod, MD;  Location: [facility];  Service: Orthopedics;  Laterality: Right;  . CARPOMETACARPEL SUSPENSION PLASTY Right 05/22/2019   Procedure: ARTHROPLASY CARPOMETACARPAL RIGHT THUMB TRAPEZIUM EXCISION TENDON TRANSFER MICRO LINK SUSPENSION;  Surgeon: Daryll Brod, MD;  Location: [facility];  Service: Orthopedics;  Laterality: Right;  . CESAREAN SECTION  1986  . COLONOSCOPY W/ BIOPSIES AND POLYPECTOMY    . EYE SURGERY Bilateral 2012   "lasered a hole in my iris cause pressure was building up"  . LAPAROSCOPIC CHOLECYSTECTOMY  1990's  . NASAL SINUS SURGERY  1980's  . POSTERIOR LUMBAR FUSION  05/2013   L4-5; S1  . POSTERIOR LUMBAR FUSION  11/04/2014   L3-4  . SHOULDER ARTHROSCOPY W/ ROTATOR CUFF REPAIR Right 2012  . TUBAL LIGATION  1992    There were no vitals filed for this  visit.   Subjective Assessment - 03/22/21 1348    Subjective I'm not yet ready to do my therapy on my own, don't feel like Im doing well.    How long can you walk comfortably? 15 minutes    Patient Stated Goals improve pain, will be new grandparents in April and hopes to be involved    Currently in Pain? Yes    Pain Score 2     Pain Location Back    Pain Orientation Lower    Pain Descriptors / Indicators Aching    Pain Type Chronic pain    Aggravating Factors  lack of stretching    Pain Relieving Factors resting, stretching, meds    Effect of Pain on Daily Activities difficulty completing  housework              [facility] PT Assessment - 03/22/21 0001      Assessment   Medical Diagnosis Rt  trochanteric bursitis    Referring Provider (PT) Swinteck, Aaron Edelman, MD      Precautions   Precautions Fall      Restrictions   Weight Bearing Restrictions No      [place] residence    Additional Comments ranch style with 2 steps into house      Prior Function   Level of Independence Independent    Leisure the only place I go by myself is to [place]   Overall Cognitive Status Within Functional Limits for tasks assessed      Sensation   Additional Comments WFL      ROM / Strength   AROM / PROM / Strength Strength      AROM   Overall AROM Comments WFL      Strength   Strength Assessment Site Hip    Right/Left Hip Right;Left    Right Hip Flexion 5/5    Right Hip Extension 4/5    Right Hip ABduction 3-/5    Left Hip Flexion 5/5    Left Hip Extension 3+/5    Left Hip ABduction 4-/5      Ambulation/Gait   Gait Comments Lt trendelenburg with near-scissoring gait (does not cross midline)      High Level Balance   High Level Balance Comments able to hold bilateral tandem single stand 3 secs or less                         [facility] Adult PT Treatment/Exercise - 03/22/21 0001      Knee/Hip Exercises: Standing   Gait Training trunk rotation, heel toe and wide right step    Other Standing Knee Exercises standing hip hike                  PT Education - 03/22/21 1742    Education Details goals, POC, HEP    Person(s) Educated Patient    Methods Explanation    Comprehension Verbalized understanding;Need further instruction            PT Short Term Goals - 02/08/21 1241      PT SHORT TERM GOAL #1   Title Pt will be ind with initial HEP    Time 3    Period Weeks    Status Achieved    Target Date 01/19/21      PT SHORT TERM GOAL #2   Title Pt will demo improved gait pattern to include improved weight  shifting into Rt LE and reduce degree of Trendelenburg bil secondary to improved  gluteal use in closed chain.    Time 4    Period Weeks    Status Partially Met    Target Date 01/26/21      PT SHORT TERM GOAL #3   Title Pt will report reduced pain with household ambulation by at least 30%    Time 4    Period Weeks    Status Achieved    Target Date 01/26/21      PT SHORT TERM GOAL #4   Title Pt will be able to sit in upright chair (vs recliner) for at least 15 mintues for mealtime with at least 30% less pain.    Baseline Was achieved but in the last week or so she has felt it again    Status On-going      PT SHORT TERM GOAL #5   Title Pt will demo 5x sit to stand without UE use in </= 13 sec    Baseline 15 sec, use of UEs; 9 sec no UEs 01/27/21    Time 4    Period Weeks    Status Achieved    Target Date 01/26/21             PT Long Term Goals - 03/22/21 1407      PT LONG TERM GOAL #1   Title Pt will report improved endurance for light ADLs and household ambulation for up to 20 min before needing seated break.    Baseline able to tolerate 15 mins    Status On-going      PT LONG TERM GOAL #2   Title Patient able to safely amb community distances using cane.    Baseline not neccesary distances for ADLs    Status On-going      PT LONG TERM GOAL #3   Title Improved Bil hip strength to >= 4+/5 to improve overall function    Baseline see flow sheet    Status On-going      PT LONG TERM GOAL #4   Title Improved 5x sit to stand to </= 11 sec to reduce fall risk    Baseline 12 sec    Status On-going      PT LONG TERM GOAL #5   Title Improved FOTO to >/= 50% to demo improved function    Baseline unable to find in system    Status Deferred                 Plan - 03/22/21 1742    Clinical Impression Statement Pt is making progress in her balance and strength but will benefit from further PT to continue to progress and meet the goals. Still presents Lt trendelenburg which contributes to groin pain. worked on incr stance width in gait to improve  balance. bil hip abd groups weak- endurance in walking and ADLs has incr but not to levels necessary for ADLs. Will work on standing during all TV commercials rather than fast-forwarding to improve standing tolerance.    PT Frequency 2x / week    PT Duration 6 weeks    PT Treatment/Interventions ADLs/Self Care Home Management;Cryotherapy;Electrical Stimulation;Moist Heat;Iontophoresis 4mg /ml Dexamethasone;Gait training;Stair training;Functional mobility training;Therapeutic exercise;Therapeutic activities;Neuromuscular re-education;Balance training;Patient/family education;Energy conservation;Passive range of motion;Manual techniques;Taping;Spinal Manipulations;Joint Manipulations    PT Next Visit Plan [facility] hip, core current (side planks, paloff press, lunges)    PT Home Exercise Plan Access Code: [email or web address]    Consulted and Agree with  Plan of Care Patient           Patient will benefit from skilled therapeutic intervention in order to improve the following deficits and impairments:  Abnormal gait,Decreased range of motion,Difficulty walking,Decreased endurance,Pain,Decreased activity tolerance,Impaired flexibility,Decreased balance,Decreased mobility,Decreased strength  Visit Diagnosis: Pain in right hip - Plan: PT plan of care cert/re-cert  Chronic bilateral low back pain, unspecified whether sciatica present - Plan: PT plan of care cert/re-cert  Muscle weakness (generalized) - Plan: PT plan of care cert/re-cert  Unsteadiness on feet - Plan: PT plan of care cert/re-cert     Problem List Patient Active Problem List   Diagnosis Date Noted  . Hypokalemia 01/12/2021  . Encounter for antineoplastic chemotherapy 11/18/2020  . Systemic lupus erythematosus ([place]) 11/03/2020  . Lupus nephritis ([place]) 11/03/2020  . Proteinuria 11/03/2020  . Other spondylosis with radiculopathy, lumbar region 12/09/2019  . Secondary adrenal insufficiency ([place]) 04/03/2019  . Metabolic acidosis, normal anion gap  (NAG) 04/02/2019  . HTN (hypertension)   . Adrenal insufficiency ([place]) 04/01/2019  . Hyponatremia 04/01/2019  . Hypothyroidism 04/01/2019  . AKI (acute kidney injury) ([place]) 04/01/2019  . Lumbar stenosis with neurogenic claudication 02/19/2017  . Migraine with aura and without status migrainosus, not intractable 07/11/2016  . Spondylolisthesis of lumbar region 11/04/2014  . Postconcussional syndrome 09/30/2013  . Post-concussion headache 09/30/2013  . Esophageal reflux 10/11/2011  . Benign neoplasm of colon 10/11/2011  . Gastritis, chronic 10/11/2011  . Guaiac + stool 09/28/2011  . IBS (irritable bowel syndrome) 09/28/2011  . GERD (gastroesophageal reflux disease) 09/28/2011  . Migraine syndrome 09/28/2011    [place] Nine C. Tadhg Eskew PT, DPT 03/22/21 5:52 PM   [facility] [place], [place], [address] Phone: [phone]   Fax:  [phone]  Name: CLOE SOCKWELL MRN: [id number] Date of Birth: 07-04-1956

## 2021-03-24 ENCOUNTER — Ambulatory Visit (HOSPITAL_BASED_OUTPATIENT_CLINIC_OR_DEPARTMENT_OTHER): Payer: Medicare Other | Attending: Orthopedic Surgery | Admitting: Physical Therapy

## 2021-03-24 ENCOUNTER — Encounter (HOSPITAL_BASED_OUTPATIENT_CLINIC_OR_DEPARTMENT_OTHER): Payer: Self-pay | Admitting: Physical Therapy

## 2021-03-24 ENCOUNTER — Other Ambulatory Visit: Payer: Self-pay

## 2021-03-24 DIAGNOSIS — M6281 Muscle weakness (generalized): Secondary | ICD-10-CM | POA: Diagnosis present

## 2021-03-24 DIAGNOSIS — R2681 Unsteadiness on feet: Secondary | ICD-10-CM | POA: Insufficient documentation

## 2021-03-24 DIAGNOSIS — M545 Low back pain, unspecified: Secondary | ICD-10-CM | POA: Diagnosis present

## 2021-03-24 DIAGNOSIS — M25551 Pain in right hip: Secondary | ICD-10-CM | POA: Diagnosis not present

## 2021-03-24 DIAGNOSIS — G8929 Other chronic pain: Secondary | ICD-10-CM | POA: Insufficient documentation

## 2021-03-24 NOTE — Therapy (Signed)
[facility] [address] [place], [place], [address] Phone: [phone]   Fax:  [phone]  Physical Therapy Treatment  Patient Details  Name: Crystal Larsen MRN: [id number] Date of Birth: 19-Nov-1956 Referring Provider (PT): Rod Can, MD   Encounter Date: 03/24/2021   PT End of Session - 03/24/21 1145    Visit Number 23    Number of Visits 34    Date for PT Re-Evaluation 05/06/21    Authorization Type UHC Medicare    Progress Note Due on Visit 30    PT Start Time 1146    PT Stop Time 1229    PT Time Calculation (min) 43 min    Activity Tolerance Patient tolerated treatment well    Behavior During Therapy [facility] for tasks assessed/performed           Past Medical History:  Diagnosis Date  . Acute kidney injury ([place])   . Adrenal insufficiency ([place])   . Anemia   . Anxiety   . Arthritis    "left foot; left hand; right shoulder; back" (11/04/2014)  . Elevated liver enzymes   . Gallstones   . GERD (gastroesophageal reflux disease)   . History of gout   . HTN (hypertension)   . Hypothyroidism   . IBS (irritable bowel syndrome)    ?  [name] Lupus ([place]) 07/2019  . Metabolic acidosis   . Migraine    "maybe once/yr" (11/04/2014)  . Neuropathy    right leg  . Obesity   . Pancreatitis   . Pancreatitis   . PONV (postoperative nausea and vomiting) 1981 X 1   only time  . Seizures ([place]) 2003    due to tramadol reaction -- takes depakote for migraine prevention  . Sleep apnea    does not use cpap  . Spondylolisthesis of lumbar region   . Unsteady gait   . Wears glasses     Past Surgical History:  Procedure Laterality Date  . ANKLE GANGLION CYST EXCISION Right 1980's  . ANTERIOR LAT LUMBAR FUSION N/A 12/09/2019   Procedure: Lumbar One-Two Anterolateral lumbar decompression/interbody fusion with lateral plate;  Surgeon: Kristeen Miss, MD;  Location: [place];  Service: Neurosurgery;  Laterality: N/A;  Lumbar One-Two  Anterolateral lumbar decompression/interbody fusion with lateral plate  . BACK SURGERY    . BREAST SURGERY     biopsy  . BUNIONECTOMY Left 2012  . CARPAL TUNNEL RELEASE Right 05/22/2019   Procedure: RIGHT CARPAL TUNNEL RELEASE;  Surgeon: Daryll Brod, MD;  Location: [place];  Service: Orthopedics;  Laterality: Right;  . CARPOMETACARPEL SUSPENSION PLASTY Right 05/22/2019   Procedure: ARTHROPLASY CARPOMETACARPAL RIGHT THUMB TRAPEZIUM EXCISION TENDON TRANSFER MICRO LINK SUSPENSION;  Surgeon: Daryll Brod, MD;  Location: [place];  Service: Orthopedics;  Laterality: Right;  . CESAREAN SECTION  1986  . COLONOSCOPY W/ BIOPSIES AND POLYPECTOMY    . EYE SURGERY Bilateral 2012   "lasered a hole in my iris cause pressure was building up"  . LAPAROSCOPIC CHOLECYSTECTOMY  1990's  . NASAL SINUS SURGERY  1980's  . POSTERIOR LUMBAR FUSION  05/2013   L4-5; S1  . POSTERIOR LUMBAR FUSION  11/04/2014   L3-4  . SHOULDER ARTHROSCOPY W/ ROTATOR CUFF REPAIR Right 2012  . TUBAL LIGATION  1992    There were no vitals filed for this visit.   Subjective Assessment - 03/24/21 1149    Subjective I had a BP drop yesterday so I didn't do much other  than lay in the recliner and rest.    Patient Stated Goals improve pain, will be new grandparents in April and hopes to be involved    Currently in Pain? No/denies                             [facility] Adult PT Treatment/Exercise - 03/24/21 0001      Knee/Hip Exercises: Standing   Abduction Limitations standing with knee bent    Other Standing Knee Exercises step up with hip hike- Rt toe on ground for assist      Knee/Hip Exercises: Prone   Other Prone Exercises quadruped: pelvic tilt, tilt + breathe, alt GHJ flx, alt hip ext, mini bird dog, hip hike, hike+hydrant                    PT Short Term Goals - 02/08/21 1241      PT SHORT TERM GOAL #1   Title Pt will be ind with initial HEP    Time 3    Period  Weeks    Status Achieved    Target Date 01/19/21      PT SHORT TERM GOAL #2   Title Pt will demo improved gait pattern to include improved weight shifting into Rt LE and reduce degree of Trendelenburg bil secondary to improved gluteal use in closed chain.    Time 4    Period Weeks    Status Partially Met    Target Date 01/26/21      PT SHORT TERM GOAL #3   Title Pt will report reduced pain with household ambulation by at least 30%    Time 4    Period Weeks    Status Achieved    Target Date 01/26/21      PT SHORT TERM GOAL #4   Title Pt will be able to sit in upright chair (vs recliner) for at least 15 mintues for mealtime with at least 30% less pain.    Baseline Was achieved but in the last week or so she has felt it again    Status On-going      PT SHORT TERM GOAL #5   Title Pt will demo 5x sit to stand without UE use in </= 13 sec    Baseline 15 sec, use of UEs; 9 sec no UEs 01/27/21    Time 4    Period Weeks    Status Achieved    Target Date 01/26/21             PT Long Term Goals - 03/22/21 1407      PT LONG TERM GOAL #1   Title Pt will report improved endurance for light ADLs and household ambulation for up to 20 min before needing seated break.    Baseline able to tolerate 15 mins    Status On-going      PT LONG TERM GOAL #2   Title Patient able to safely amb community distances using cane.    Baseline not neccesary distances for ADLs    Status On-going      PT LONG TERM GOAL #3   Title Improved Bil hip strength to >= 4+/5 to improve overall function    Baseline see flow sheet    Status On-going      PT LONG TERM GOAL #4   Title Improved 5x sit to stand to </= 11 sec to reduce fall risk    Baseline  12 sec    Status On-going      PT LONG TERM GOAL #5   Title Improved FOTO to >/= 50% to demo improved function    Baseline unable to find in system    Status Deferred                 Plan - 03/24/21 2113    Clinical Impression Statement Used  barre and mirrors today for cues regarding form in standing exercises. Pt has good awareness of motions and requires minimal cuing when she has visual cues available. Limited significantly, still, by weakness in Lt hip abd group.    PT Treatment/Interventions ADLs/Self Care Home Management;Cryotherapy;Electrical Stimulation;Moist Heat;Iontophoresis 24m/ml Dexamethasone;Gait training;Stair training;Functional mobility training;Therapeutic exercise;Therapeutic activities;Neuromuscular re-education;Balance training;Patient/family education;Energy conservation;Passive range of motion;Manual techniques;Taping;Spinal Manipulations;Joint Manipulations    PT Next Visit Plan CKC hip- cont in mirror; trial SL press on shuttle    PT Home Exercise Plan Access Code: [email or web address]   Consulted and Agree with Plan of Care Patient           Patient will benefit from skilled therapeutic intervention in order to improve the following deficits and impairments:  Abnormal gait,Decreased range of motion,Difficulty walking,Decreased endurance,Pain,Decreased activity tolerance,Impaired flexibility,Decreased balance,Decreased mobility,Decreased strength  Visit Diagnosis: Pain in right hip  Chronic bilateral low back pain, unspecified whether sciatica present  Muscle weakness (generalized)  Unsteadiness on feet     Problem List Patient Active Problem List   Diagnosis Date Noted  . Hypokalemia 01/12/2021  . Encounter for antineoplastic chemotherapy 11/18/2020  . Systemic lupus erythematosus ([place] 11/03/2020  . Lupus nephritis ([place] 11/03/2020  . Proteinuria 11/03/2020  . Other spondylosis with radiculopathy, lumbar region 12/09/2019  . Secondary adrenal insufficiency ([place] 04/03/2019  . Metabolic acidosis, normal anion gap (NAG) 04/02/2019  . HTN (hypertension)   . Adrenal insufficiency ([facility] [place] 04/01/2019  . Hyponatremia 04/01/2019  . Hypothyroidism 04/01/2019  . AKI (acute kidney injury) ([place] 04/01/2019  .  Lumbar stenosis with neurogenic claudication 02/19/2017  . Migraine with aura and without status migrainosus, not intractable 07/11/2016  . Spondylolisthesis of lumbar region 11/04/2014  . Postconcussional syndrome 09/30/2013  . Post-concussion headache 09/30/2013  . Esophageal reflux 10/11/2011  . Benign neoplasm of colon 10/11/2011  . Gastritis, chronic 10/11/2011  . Guaiac + stool 09/28/2011  . IBS (irritable bowel syndrome) 09/28/2011  . GERD (gastroesophageal reflux disease) 09/28/2011  . Migraine syndrome 09/28/2011    Crystal Larsen PT, DPT 03/24/21 9:16 PM   [facility] [address] [address]Phone: [phone]  Fax:  [phone] Name: KMYRELLA FAHSMRN: [id number] of Birth: 108-30-1957

## 2021-03-28 ENCOUNTER — Encounter (HOSPITAL_BASED_OUTPATIENT_CLINIC_OR_DEPARTMENT_OTHER): Payer: Self-pay | Admitting: Physical Therapy

## 2021-03-28 ENCOUNTER — Other Ambulatory Visit: Payer: Self-pay

## 2021-03-28 ENCOUNTER — Ambulatory Visit (HOSPITAL_BASED_OUTPATIENT_CLINIC_OR_DEPARTMENT_OTHER): Payer: Medicare Other | Attending: Orthopedic Surgery | Admitting: Physical Therapy

## 2021-03-28 DIAGNOSIS — G8929 Other chronic pain: Secondary | ICD-10-CM | POA: Insufficient documentation

## 2021-03-28 DIAGNOSIS — R2681 Unsteadiness on feet: Secondary | ICD-10-CM | POA: Diagnosis present

## 2021-03-28 DIAGNOSIS — M545 Low back pain, unspecified: Secondary | ICD-10-CM | POA: Diagnosis present

## 2021-03-28 DIAGNOSIS — M6281 Muscle weakness (generalized): Secondary | ICD-10-CM | POA: Insufficient documentation

## 2021-03-28 DIAGNOSIS — M25551 Pain in right hip: Secondary | ICD-10-CM | POA: Insufficient documentation

## 2021-03-28 NOTE — Therapy (Signed)
[facility] [place], [place], [address] Phone: [phone]   Fax:  [phone]  Physical Therapy Treatment  Patient Details  Name: Crystal Larsen MRN: [id number] Date of Birth: 02/08/1956 Referring Provider (PT): Rod Can, MD   Encounter Date: 03/28/2021   PT End of Session - 03/28/21 1432    Visit Number 24    Number of Visits 34    Date for PT Re-Evaluation 05/06/21    Authorization Type UHC Medicare    PT Start Time 1346    PT Stop Time 1430    PT Time Calculation (min) 44 min    Activity Tolerance Patient tolerated treatment well    Behavior During Therapy [facility] for tasks assessed/performed           Past Medical History:  Diagnosis Date  . Acute kidney injury ([place])   . Adrenal insufficiency ([place])   . Anemia   . Anxiety   . Arthritis    "left foot; left hand; right shoulder; back" (11/04/2014)  . Elevated liver enzymes   . Gallstones   . GERD (gastroesophageal reflux disease)   . History of gout   . HTN (hypertension)   . Hypothyroidism   . IBS (irritable bowel syndrome)    ?  [name] Lupus ([place]) 07/2019  . Metabolic acidosis   . Migraine    "maybe once/yr" (11/04/2014)  . Neuropathy    right leg  . Obesity   . Pancreatitis   . Pancreatitis   . PONV (postoperative nausea and vomiting) 1981 X 1   only time  . Seizures ([place]) 2003    due to tramadol reaction -- takes depakote for migraine prevention  . Sleep apnea    does not use cpap  . Spondylolisthesis of lumbar region   . Unsteady gait   . Wears glasses     Past Surgical History:  Procedure Laterality Date  . ANKLE GANGLION CYST EXCISION Right 1980's  . ANTERIOR LAT LUMBAR FUSION N/A 12/09/2019   Procedure: Lumbar One-Two Anterolateral lumbar decompression/interbody fusion with lateral plate;  Surgeon: Kristeen Miss, MD;  Location: [place];  Service: Neurosurgery;  Laterality: N/A;  Lumbar One-Two Anterolateral lumbar  decompression/interbody fusion with lateral plate  . BACK SURGERY    . BREAST SURGERY     biopsy  . BUNIONECTOMY Left 2012  . CARPAL TUNNEL RELEASE Right 05/22/2019   Procedure: RIGHT CARPAL TUNNEL RELEASE;  Surgeon: Daryll Brod, MD;  Location: [facility];  Service: Orthopedics;  Laterality: Right;  . CARPOMETACARPEL SUSPENSION PLASTY Right 05/22/2019   Procedure: ARTHROPLASY CARPOMETACARPAL RIGHT THUMB TRAPEZIUM EXCISION TENDON TRANSFER MICRO LINK SUSPENSION;  Surgeon: Daryll Brod, MD;  Location: [facility];  Service: Orthopedics;  Laterality: Right;  . CESAREAN SECTION  1986  . COLONOSCOPY W/ BIOPSIES AND POLYPECTOMY    . EYE SURGERY Bilateral 2012   "lasered a hole in my iris cause pressure was building up"  . LAPAROSCOPIC CHOLECYSTECTOMY  1990's  . NASAL SINUS SURGERY  1980's  . POSTERIOR LUMBAR FUSION  05/2013   L4-5; S1  . POSTERIOR LUMBAR FUSION  11/04/2014   L3-4  . SHOULDER ARTHROSCOPY W/ ROTATOR CUFF REPAIR Right 2012  . TUBAL LIGATION  1992    There were no vitals filed for this visit.   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    Patient Stated Goals improve pain, will be  new grandparents in April and hopes to be involved    Currently in Pain? No/denies                             [facility] Adult PT Treatment/Exercise - 03/28/21 0001      Knee/Hip Exercises: Stretches   Passive Hamstring Stretch Both;30 seconds;2 reps    Piriformis Stretch Both;30 seconds;2 reps      Knee/Hip Exercises: Aerobic   Nustep 5.5 min L5      Knee/Hip Exercises: Machines for Strengthening   Other Machine shuttle SL 25; frog press 25x2; plyo hops 25+6      Knee/Hip Exercises: Standing   Abduction Limitations lateral step to wide squat from bar    Other Standing Knee Exercises single leg hinge with hip ER                  PT Education - 03/28/21 1534     Education Details bursitis, anatomy of condition    Person(s) Educated Patient    Methods Explanation    Comprehension Verbalized understanding;Need further instruction            PT Short Term Goals - 02/08/21 1241      PT SHORT TERM GOAL #1   Title Pt will be ind with initial HEP    Time 3    Period Weeks    Status Achieved    Target Date 01/19/21      PT SHORT TERM GOAL #2   Title Pt will demo improved gait pattern to include improved weight shifting into Rt LE and reduce degree of Trendelenburg bil secondary to improved gluteal use in closed chain.    Time 4    Period Weeks    Status Partially Met    Target Date 01/26/21      PT SHORT TERM GOAL #3   Title Pt will report reduced pain with household ambulation by at least 30%    Time 4    Period Weeks    Status Achieved    Target Date 01/26/21      PT SHORT TERM GOAL #4   Title Pt will be able to sit in upright chair (vs recliner) for at least 15 mintues for mealtime with at least 30% less pain.    Baseline Was achieved but in the last week or so she has felt it again    Status On-going      PT SHORT TERM GOAL #5   Title Pt will demo 5x sit to stand without UE use in </= 13 sec    Baseline 15 sec, use of UEs; 9 sec no UEs 01/27/21    Time 4    Period Weeks    Status Achieved    Target Date 01/26/21             PT Long Term Goals - 03/22/21 1407      PT LONG TERM GOAL #1   Title Pt will report improved endurance for light ADLs and household ambulation for up to 20 min before needing seated break.    Baseline able to tolerate 15 mins    Status On-going      PT LONG TERM GOAL #2   Title Patient able to safely amb community distances using cane.    Baseline not neccesary distances for ADLs    Status On-going      PT LONG TERM GOAL #3   Title  Improved Bil hip strength to >= 4+/5 to improve overall function    Baseline see flow sheet    Status On-going      PT LONG TERM GOAL #4   Title Improved 5x sit to  stand to </= 11 sec to reduce fall risk    Baseline 12 sec    Status On-going      PT LONG TERM GOAL #5   Title Improved FOTO to >/= 50% to demo improved function    Baseline unable to find in system    Status Deferred                 Plan - 03/28/21 1532    Clinical Impression Statement Becoming much more aware of body alignment and activation of hip abductors. Recognizes stance width without cuing and is walking with less prominent trendelenburg gait.    PT Treatment/Interventions ADLs/Self Care Home Management;Cryotherapy;Electrical Stimulation;Moist Heat;Iontophoresis 4mg /ml Dexamethasone;Gait training;Stair training;Functional mobility training;Therapeutic exercise;Therapeutic activities;Neuromuscular re-education;Balance training;Patient/family education;Energy conservation;Passive range of motion;Manual techniques;Taping;Spinal Manipulations;Joint Manipulations    PT Next Visit Plan cont shuttle, keep using mirror    PT Home Exercise Plan Access Code: [email or web address]    Consulted and Agree with Plan of Care Patient           Patient will benefit from skilled therapeutic intervention in order to improve the following deficits and impairments:  Abnormal gait,Decreased range of motion,Difficulty walking,Decreased endurance,Pain,Decreased activity tolerance,Impaired flexibility,Decreased balance,Decreased mobility,Decreased strength  Visit Diagnosis: Pain in right hip  Chronic bilateral low back pain, unspecified whether sciatica present  Muscle weakness (generalized)  Unsteadiness on feet     Problem List Patient Active Problem List   Diagnosis Date Noted  . Hypokalemia 01/12/2021  . Encounter for antineoplastic chemotherapy 11/18/2020  . Systemic lupus erythematosus ([place]) 11/03/2020  . Lupus nephritis ([place]) 11/03/2020  . Proteinuria 11/03/2020  . Other spondylosis with radiculopathy, lumbar region 12/09/2019  . Secondary adrenal insufficiency ([place]) 04/03/2019  .  Metabolic acidosis, normal anion gap (NAG) 04/02/2019  . HTN (hypertension)   . Adrenal insufficiency ([place]) 04/01/2019  . Hyponatremia 04/01/2019  . Hypothyroidism 04/01/2019  . AKI (acute kidney injury) ([place]) 04/01/2019  . Lumbar stenosis with neurogenic claudication 02/19/2017  . Migraine with aura and without status migrainosus, not intractable 07/11/2016  . Spondylolisthesis of lumbar region 11/04/2014  . Postconcussional syndrome 09/30/2013  . Post-concussion headache 09/30/2013  . Esophageal reflux 10/11/2011  . Benign neoplasm of colon 10/11/2011  . Gastritis, chronic 10/11/2011  . Guaiac + stool 09/28/2011  . IBS (irritable bowel syndrome) 09/28/2011  . GERD (gastroesophageal reflux disease) 09/28/2011  . Migraine syndrome 09/28/2011   Thorne Wirz C. Charon Smedberg PT, DPT 03/28/21 3:34 PM   [facility] [place], [place], [address] Phone: [phone]   Fax:  [phone]  Name: Crystal Larsen MRN: [id number] Date of Birth: Sep 10, 1956

## 2021-04-01 ENCOUNTER — Other Ambulatory Visit: Payer: Self-pay

## 2021-04-01 ENCOUNTER — Ambulatory Visit (HOSPITAL_BASED_OUTPATIENT_CLINIC_OR_DEPARTMENT_OTHER): Payer: Medicare Other | Admitting: Physical Therapy

## 2021-04-01 DIAGNOSIS — M25551 Pain in right hip: Secondary | ICD-10-CM | POA: Diagnosis not present

## 2021-04-01 DIAGNOSIS — M545 Low back pain, unspecified: Secondary | ICD-10-CM

## 2021-04-01 DIAGNOSIS — G8929 Other chronic pain: Secondary | ICD-10-CM

## 2021-04-01 DIAGNOSIS — R2681 Unsteadiness on feet: Secondary | ICD-10-CM

## 2021-04-01 DIAGNOSIS — M6281 Muscle weakness (generalized): Secondary | ICD-10-CM

## 2021-04-01 NOTE — Therapy (Signed)
[facility] [address] [place], [place], [address] Phone: [phone]   Fax:  [phone]  Physical Therapy Treatment  Patient Details  Name: Crystal Larsen MRN: [id number] Date of Birth: 06-10-1956 Referring Provider (PT): Rod Can, MD   Encounter Date: 04/01/2021   PT End of Session - 04/01/21 1304    Visit Number 25    Number of Visits 34    Date for PT Re-Evaluation 05/06/21    Authorization Type UHC Medicare    Progress Note Due on Visit 30    PT Start Time 1302    PT Stop Time 1345    PT Time Calculation (min) 43 min    Activity Tolerance Patient tolerated treatment well    Behavior During Therapy [facility] for tasks assessed/performed           Past Medical History:  Diagnosis Date  . Acute kidney injury ([place])   . Adrenal insufficiency ([place])   . Anemia   . Anxiety   . Arthritis    "left foot; left hand; right shoulder; back" (11/04/2014)  . Elevated liver enzymes   . Gallstones   . GERD (gastroesophageal reflux disease)   . History of gout   . HTN (hypertension)   . Hypothyroidism   . IBS (irritable bowel syndrome)    ?  [name] Lupus ([place]) 07/2019  . Metabolic acidosis   . Migraine    "maybe once/yr" (11/04/2014)  . Neuropathy    right leg  . Obesity   . Pancreatitis   . Pancreatitis   . PONV (postoperative nausea and vomiting) 1981 X 1   only time  . Seizures ([place]) 2003    due to tramadol reaction -- takes depakote for migraine prevention  . Sleep apnea    does not use cpap  . Spondylolisthesis of lumbar region   . Unsteady gait   . Wears glasses     Past Surgical History:  Procedure Laterality Date  . ANKLE GANGLION CYST EXCISION Right 1980's  . ANTERIOR LAT LUMBAR FUSION N/A 12/09/2019   Procedure: Lumbar One-Two Anterolateral lumbar decompression/interbody fusion with lateral plate;  Surgeon: Kristeen Miss, MD;  Location: [facility];  Service: Neurosurgery;  Laterality: N/A;  Lumbar One-Two  Anterolateral lumbar decompression/interbody fusion with lateral plate  . BACK SURGERY    . BREAST SURGERY     biopsy  . BUNIONECTOMY Left 2012  . CARPAL TUNNEL RELEASE Right 05/22/2019   Procedure: RIGHT CARPAL TUNNEL RELEASE;  Surgeon: Daryll Brod, MD;  Location: [facility];  Service: Orthopedics;  Laterality: Right;  . CARPOMETACARPEL SUSPENSION PLASTY Right 05/22/2019   Procedure: ARTHROPLASY CARPOMETACARPAL RIGHT THUMB TRAPEZIUM EXCISION TENDON TRANSFER MICRO LINK SUSPENSION;  Surgeon: Daryll Brod, MD;  Location: [facility];  Service: Orthopedics;  Laterality: Right;  . CESAREAN SECTION  1986  . COLONOSCOPY W/ BIOPSIES AND POLYPECTOMY    . EYE SURGERY Bilateral 2012   "lasered a hole in my iris cause pressure was building up"  . LAPAROSCOPIC CHOLECYSTECTOMY  1990's  . NASAL SINUS SURGERY  1980's  . POSTERIOR LUMBAR FUSION  05/2013   L4-5; S1  . POSTERIOR LUMBAR FUSION  11/04/2014   L3-4  . SHOULDER ARTHROSCOPY W/ ROTATOR CUFF REPAIR Right 2012  . TUBAL LIGATION  1992    There were no vitals filed for this visit.   Subjective Assessment - 04/01/21 1303    Subjective feels almost like a knot when I lay on my Rt side.  Patient Stated Goals improve pain, will be new grandparents in April and hopes to be involved    Currently in Pain? No/denies                             [facility] Adult PT Treatment/Exercise - 04/01/21 0001      Knee/Hip Exercises: Stretches   Piriformis Stretch Limitations seated      Knee/Hip Exercises: Standing   SLS with Vectors with hip hike to level pelvis    Gait Training level pelvis- using marching, hip hike, UE support    Other Standing Knee Exercises ascend & descend stairs with level pelvis      Knee/Hip Exercises: Seated   Other Seated Knee/Hip Exercises seated round back for core- attempted weight but created Rt hip pain      Knee/Hip Exercises: Sidelying   Other Sidelying Knee/Hip Exercises  shuttle SL press bil 3x10, 25      Manual Therapy   Manual Therapy Soft tissue mobilization    Soft tissue mobilization Rt piriformis                    PT Short Term Goals - 02/08/21 1241      PT SHORT TERM GOAL #1   Title Pt will be ind with initial HEP    Time 3    Period Weeks    Status Achieved    Target Date 01/19/21      PT SHORT TERM GOAL #2   Title Pt will demo improved gait pattern to include improved weight shifting into Rt LE and reduce degree of Trendelenburg bil secondary to improved gluteal use in closed chain.    Time 4    Period Weeks    Status Partially Met    Target Date 01/26/21      PT SHORT TERM GOAL #3   Title Pt will report reduced pain with household ambulation by at least 30%    Time 4    Period Weeks    Status Achieved    Target Date 01/26/21      PT SHORT TERM GOAL #4   Title Pt will be able to sit in upright chair (vs recliner) for at least 15 mintues for mealtime with at least 30% less pain.    Baseline Was achieved but in the last week or so she has felt it again    Status On-going      PT SHORT TERM GOAL #5   Title Pt will demo 5x sit to stand without UE use in </= 13 sec    Baseline 15 sec, use of UEs; 9 sec no UEs 01/27/21    Time 4    Period Weeks    Status Achieved    Target Date 01/26/21             PT Long Term Goals - 03/22/21 1407      PT LONG TERM GOAL #1   Title Pt will report improved endurance for light ADLs and household ambulation for up to 20 min before needing seated break.    Baseline able to tolerate 15 mins    Status On-going      PT LONG TERM GOAL #2   Title Patient able to safely amb community distances using cane.    Baseline not neccesary distances for ADLs    Status On-going      PT LONG TERM GOAL #3   Title  Improved Bil hip strength to >= 4+/5 to improve overall function    Baseline see flow sheet    Status On-going      PT LONG TERM GOAL #4   Title Improved 5x sit to stand to </= 11  sec to reduce fall risk    Baseline 12 sec    Status On-going      PT LONG TERM GOAL #5   Title Improved FOTO to >/= 50% to demo improved function    Baseline unable to find in system    Status Deferred                 Plan - 04/01/21 1526    Clinical Impression Statement Pt is able to perform near-correction in trendelenburg when she is focused on it. notable improvement in stance width.    PT Treatment/Interventions ADLs/Self Care Home Management;Cryotherapy;Electrical Stimulation;Moist Heat;Iontophoresis 4mg /ml Dexamethasone;Gait training;Stair training;Functional mobility training;Therapeutic exercise;Therapeutic activities;Neuromuscular re-education;Balance training;Patient/family education;Energy conservation;Passive range of motion;Manual techniques;Taping;Spinal Manipulations;Joint Manipulations    PT Next Visit Plan cont shuttle, keep using mirror    PT Home Exercise Plan Access Code: [email or web address]    Consulted and Agree with Plan of Care Patient           Patient will benefit from skilled therapeutic intervention in order to improve the following deficits and impairments:  Abnormal gait,Decreased range of motion,Difficulty walking,Decreased endurance,Pain,Decreased activity tolerance,Impaired flexibility,Decreased balance,Decreased mobility,Decreased strength  Visit Diagnosis: Pain in right hip  Chronic bilateral low back pain, unspecified whether sciatica present  Muscle weakness (generalized)  Unsteadiness on feet     Problem List Patient Active Problem List   Diagnosis Date Noted  . Hypokalemia 01/12/2021  . Encounter for antineoplastic chemotherapy 11/18/2020  . Systemic lupus erythematosus ([place]) 11/03/2020  . Lupus nephritis ([place]) 11/03/2020  . Proteinuria 11/03/2020  . Other spondylosis with radiculopathy, lumbar region 12/09/2019  . Secondary adrenal insufficiency ([place]) 04/03/2019  . Metabolic acidosis, normal anion gap (NAG) 04/02/2019  . HTN  (hypertension)   . Adrenal insufficiency ([place]) 04/01/2019  . Hyponatremia 04/01/2019  . Hypothyroidism 04/01/2019  . AKI (acute kidney injury) ([place]) 04/01/2019  . Lumbar stenosis with neurogenic claudication 02/19/2017  . Migraine with aura and without status migrainosus, not intractable 07/11/2016  . Spondylolisthesis of lumbar region 11/04/2014  . Postconcussional syndrome 09/30/2013  . Post-concussion headache 09/30/2013  . Esophageal reflux 10/11/2011  . Benign neoplasm of colon 10/11/2011  . Gastritis, chronic 10/11/2011  . Guaiac + stool 09/28/2011  . IBS (irritable bowel syndrome) 09/28/2011  . GERD (gastroesophageal reflux disease) 09/28/2011  . Migraine syndrome 09/28/2011    Michel Hendon C. Leny Morozov PT, DPT 04/01/21 3:32 PM   [facility] [place], [place], [address] Phone: [phone]   Fax:  [phone]  Name: Crystal Larsen MRN: [id number] Date of Birth: January 27, 1956

## 2021-04-05 ENCOUNTER — Encounter (HOSPITAL_BASED_OUTPATIENT_CLINIC_OR_DEPARTMENT_OTHER): Payer: Self-pay | Admitting: Physical Therapy

## 2021-04-05 ENCOUNTER — Ambulatory Visit (HOSPITAL_BASED_OUTPATIENT_CLINIC_OR_DEPARTMENT_OTHER): Payer: Medicare Other | Admitting: Physical Therapy

## 2021-04-05 ENCOUNTER — Other Ambulatory Visit: Payer: Self-pay

## 2021-04-05 DIAGNOSIS — R2681 Unsteadiness on feet: Secondary | ICD-10-CM

## 2021-04-05 DIAGNOSIS — M25551 Pain in right hip: Secondary | ICD-10-CM | POA: Diagnosis not present

## 2021-04-05 DIAGNOSIS — M6281 Muscle weakness (generalized): Secondary | ICD-10-CM

## 2021-04-05 DIAGNOSIS — M545 Low back pain, unspecified: Secondary | ICD-10-CM

## 2021-04-05 NOTE — Therapy (Signed)
[facility] [place], [place], [address] Phone: [phone]   Fax:  [phone]  Physical Therapy Treatment  Patient Details  Name: Crystal Larsen MRN: [id number] Date of Birth: 1956-11-22 Referring Provider (PT): Rod Can, MD   Encounter Date: 04/05/2021   PT End of Session - 04/05/21 1303    Visit Number 26    Number of Visits 34    Date for PT Re-Evaluation 05/06/21    Authorization Type UHC Medicare    PT Start Time 1300    PT Stop Time 1340    PT Time Calculation (min) 40 min    Activity Tolerance Patient tolerated treatment well    Behavior During Therapy [facility] for tasks assessed/performed           Past Medical History:  Diagnosis Date  . Acute kidney injury ([place])   . Adrenal insufficiency ([place])   . Anemia   . Anxiety   . Arthritis    "left foot; left hand; right shoulder; back" (11/04/2014)  . Elevated liver enzymes   . Gallstones   . GERD (gastroesophageal reflux disease)   . History of gout   . HTN (hypertension)   . Hypothyroidism   . IBS (irritable bowel syndrome)    ?  [name] Lupus ([place]) 07/2019  . Metabolic acidosis   . Migraine    "maybe once/yr" (11/04/2014)  . Neuropathy    right leg  . Obesity   . Pancreatitis   . Pancreatitis   . PONV (postoperative nausea and vomiting) 1981 X 1   only time  . Seizures ([place]) 2003    due to tramadol reaction -- takes depakote for migraine prevention  . Sleep apnea    does not use cpap  . Spondylolisthesis of lumbar region   . Unsteady gait   . Wears glasses     Past Surgical History:  Procedure Laterality Date  . ANKLE GANGLION CYST EXCISION Right 1980's  . ANTERIOR LAT LUMBAR FUSION N/A 12/09/2019   Procedure: Lumbar One-Two Anterolateral lumbar decompression/interbody fusion with lateral plate;  Surgeon: Kristeen Miss, MD;  Location: [facility];  Service: Neurosurgery;  Laterality: N/A;  Lumbar One-Two Anterolateral lumbar  decompression/interbody fusion with lateral plate  . BACK SURGERY    . BREAST SURGERY     biopsy  . BUNIONECTOMY Left 2012  . CARPAL TUNNEL RELEASE Right 05/22/2019   Procedure: RIGHT CARPAL TUNNEL RELEASE;  Surgeon: Daryll Brod, MD;  Location: [facility];  Service: Orthopedics;  Laterality: Right;  . CARPOMETACARPEL SUSPENSION PLASTY Right 05/22/2019   Procedure: ARTHROPLASY CARPOMETACARPAL RIGHT THUMB TRAPEZIUM EXCISION TENDON TRANSFER MICRO LINK SUSPENSION;  Surgeon: Daryll Brod, MD;  Location: [facility];  Service: Orthopedics;  Laterality: Right;  . CESAREAN SECTION  1986  . COLONOSCOPY W/ BIOPSIES AND POLYPECTOMY    . EYE SURGERY Bilateral 2012   "lasered a hole in my iris cause pressure was building up"  . LAPAROSCOPIC CHOLECYSTECTOMY  1990's  . NASAL SINUS SURGERY  1980's  . POSTERIOR LUMBAR FUSION  05/2013   L4-5; S1  . POSTERIOR LUMBAR FUSION  11/04/2014   L3-4  . SHOULDER ARTHROSCOPY W/ ROTATOR CUFF REPAIR Right 2012  . TUBAL LIGATION  1992    There were no vitals filed for this visit.       [facility] PT Assessment - 04/05/21 0001      Strength   Right Hip ABduction 3+/5    Left Hip ABduction 4-/5  [facility] Adult PT Treatment/Exercise - 04/05/21 0001      Knee/Hip Exercises: Machines for Strengthening   Other Machine shuttle 2x25 neutral & frog press, marching press away      Knee/Hip Exercises: Standing   Gait Training around track- side stepping & fwd walking: wide stance width, arm swing, forward feet    Other Standing Knee Exercises Trx: golfer hinge, sumo squats    Other Standing Knee Exercises stairs: up and down- level pelvis                    PT Short Term Goals - 02/08/21 1241      PT SHORT TERM GOAL #1   Title Pt will be ind with initial HEP    Time 3    Period Weeks    Status Achieved    Target Date 01/19/21      PT SHORT TERM GOAL #2   Title Pt will demo improved gait  pattern to include improved weight shifting into Rt LE and reduce degree of Trendelenburg bil secondary to improved gluteal use in closed chain.    Time 4    Period Weeks    Status Partially Met    Target Date 01/26/21      PT SHORT TERM GOAL #3   Title Pt will report reduced pain with household ambulation by at least 30%    Time 4    Period Weeks    Status Achieved    Target Date 01/26/21      PT SHORT TERM GOAL #4   Title Pt will be able to sit in upright chair (vs recliner) for at least 15 mintues for mealtime with at least 30% less pain.    Baseline Was achieved but in the last week or so she has felt it again    Status On-going      PT SHORT TERM GOAL #5   Title Pt will demo 5x sit to stand without UE use in </= 13 sec    Baseline 15 sec, use of UEs; 9 sec no UEs 01/27/21    Time 4    Period Weeks    Status Achieved    Target Date 01/26/21             PT Long Term Goals - 03/22/21 1407      PT LONG TERM GOAL #1   Title Pt will report improved endurance for light ADLs and household ambulation for up to 20 min before needing seated break.    Baseline able to tolerate 15 mins    Status On-going      PT LONG TERM GOAL #2   Title Patient able to safely amb community distances using cane.    Baseline not neccesary distances for ADLs    Status On-going      PT LONG TERM GOAL #3   Title Improved Bil hip strength to >= 4+/5 to improve overall function    Baseline see flow sheet    Status On-going      PT LONG TERM GOAL #4   Title Improved 5x sit to stand to </= 11 sec to reduce fall risk    Baseline 12 sec    Status On-going      PT LONG TERM GOAL #5   Title Improved FOTO to >/= 50% to demo improved function    Baseline unable to find in system    Status Deferred  Plan - 04/05/21 1348    Clinical Impression Statement Pt more aware of form in walking- cues provided again for stance width and level pelvis but was notable when she became  fatigued that control was difficult.    PT Treatment/Interventions ADLs/Self Care Home Management;Cryotherapy;Electrical Stimulation;Moist Heat;Iontophoresis 4mg /ml Dexamethasone;Gait training;Stair training;Functional mobility training;Therapeutic exercise;Therapeutic activities;Neuromuscular re-education;Balance training;Patient/family education;Energy conservation;Passive range of motion;Manual techniques;Taping;Spinal Manipulations;Joint Manipulations    PT Next Visit Plan cont shuttle, SLS    PT Home Exercise Plan Access Code: [email or web address]    Consulted and Agree with Plan of Care Patient           Patient will benefit from skilled therapeutic intervention in order to improve the following deficits and impairments:  Abnormal gait,Decreased range of motion,Difficulty walking,Decreased endurance,Pain,Decreased activity tolerance,Impaired flexibility,Decreased balance,Decreased mobility,Decreased strength  Visit Diagnosis: Pain in right hip  Chronic bilateral low back pain, unspecified whether sciatica present  Muscle weakness (generalized)  Unsteadiness on feet     Problem List Patient Active Problem List   Diagnosis Date Noted  . Hypokalemia 01/12/2021  . Encounter for antineoplastic chemotherapy 11/18/2020  . Systemic lupus erythematosus ([place]) 11/03/2020  . Lupus nephritis ([place]) 11/03/2020  . Proteinuria 11/03/2020  . Other spondylosis with radiculopathy, lumbar region 12/09/2019  . Secondary adrenal insufficiency ([place]) 04/03/2019  . Metabolic acidosis, normal anion gap (NAG) 04/02/2019  . HTN (hypertension)   . Adrenal insufficiency ([place]) 04/01/2019  . Hyponatremia 04/01/2019  . Hypothyroidism 04/01/2019  . AKI (acute kidney injury) ([place]) 04/01/2019  . Lumbar stenosis with neurogenic claudication 02/19/2017  . Migraine with aura and without status migrainosus, not intractable 07/11/2016  . Spondylolisthesis of lumbar region 11/04/2014  . Postconcussional syndrome  09/30/2013  . Post-concussion headache 09/30/2013  . Esophageal reflux 10/11/2011  . Benign neoplasm of colon 10/11/2011  . Gastritis, chronic 10/11/2011  . Guaiac + stool 09/28/2011  . IBS (irritable bowel syndrome) 09/28/2011  . GERD (gastroesophageal reflux disease) 09/28/2011  . Migraine syndrome 09/28/2011    Tanaka Gillen C. Tayvon Culley PT, DPT 04/05/21 1:51 PM   [facility] [place], [place], [address] Phone: [phone]   Fax:  [phone]  Name: Crystal Larsen MRN: [id number] Date of Birth: 18-Feb-1956

## 2021-04-12 ENCOUNTER — Encounter (HOSPITAL_BASED_OUTPATIENT_CLINIC_OR_DEPARTMENT_OTHER): Payer: Self-pay | Admitting: Physical Therapy

## 2021-04-12 ENCOUNTER — Ambulatory Visit (HOSPITAL_BASED_OUTPATIENT_CLINIC_OR_DEPARTMENT_OTHER): Payer: Medicare Other | Admitting: Physical Therapy

## 2021-04-12 ENCOUNTER — Other Ambulatory Visit: Payer: Self-pay

## 2021-04-12 DIAGNOSIS — M25551 Pain in right hip: Secondary | ICD-10-CM | POA: Diagnosis not present

## 2021-04-12 DIAGNOSIS — G8929 Other chronic pain: Secondary | ICD-10-CM

## 2021-04-12 DIAGNOSIS — R2681 Unsteadiness on feet: Secondary | ICD-10-CM

## 2021-04-12 DIAGNOSIS — M6281 Muscle weakness (generalized): Secondary | ICD-10-CM

## 2021-04-12 NOTE — Therapy (Signed)
[place], [place], [address] Phone: [phone]   Fax:  [phone]  Physical Therapy Treatment  Patient Details  Name: Crystal Larsen MRN: [id number] Date of Birth: Dec 02, 1955 Referring Provider (PT): Rod Can, MD   Encounter Date: 04/12/2021   PT End of Session - 04/12/21 1349    Visit Number 27    Number of Visits 34    Date for PT Re-Evaluation 05/06/21    Authorization Type UHC Medicare    Progress Note Due on Visit 30    PT Start Time 1347    PT Stop Time 1430    PT Time Calculation (min) 43 min    Activity Tolerance Patient tolerated treatment well    Behavior During Therapy [facility] for tasks assessed/performed           Past Medical History:  Diagnosis Date  . Acute kidney injury ([place])   . Adrenal insufficiency ([place])   . Anemia   . Anxiety   . Arthritis    "left foot; left hand; right shoulder; back" (11/04/2014)  . Elevated liver enzymes   . Gallstones   . GERD (gastroesophageal reflux disease)   . History of gout   . HTN (hypertension)   . Hypothyroidism   . IBS (irritable bowel syndrome)    ?  [name] Lupus ([place]) 07/2019  . Metabolic acidosis   . Migraine    "maybe once/yr" (11/04/2014)  . Neuropathy    right leg  . Obesity   . Pancreatitis   . Pancreatitis   . PONV (postoperative nausea and vomiting) 1981 X 1   only time  . Seizures ([place]) 2003    due to tramadol reaction -- takes depakote for migraine prevention  . Sleep apnea    does not use cpap  . Spondylolisthesis of lumbar region   . Unsteady gait   . Wears glasses     Past Surgical History:  Procedure Laterality Date  . ANKLE GANGLION CYST EXCISION Right 1980's  . ANTERIOR LAT LUMBAR FUSION N/A 12/09/2019   Procedure: Lumbar One-Two Anterolateral lumbar decompression/interbody fusion with lateral plate;  Surgeon: Kristeen Miss, MD;  Location: [place];  Service: Neurosurgery;  Laterality: N/A;  Lumbar One-Two  Anterolateral lumbar decompression/interbody fusion with lateral plate  . BACK SURGERY    . BREAST SURGERY     biopsy  . BUNIONECTOMY Left 2012  . CARPAL TUNNEL RELEASE Right 05/22/2019   Procedure: RIGHT CARPAL TUNNEL RELEASE;  Surgeon: Daryll Brod, MD;  Location: [place];  Service: Orthopedics;  Laterality: Right;  . CARPOMETACARPEL SUSPENSION PLASTY Right 05/22/2019   Procedure: ARTHROPLASY CARPOMETACARPAL RIGHT THUMB TRAPEZIUM EXCISION TENDON TRANSFER MICRO LINK SUSPENSION;  Surgeon: Daryll Brod, MD;  Location: [place];  Service: Orthopedics;  Laterality: Right;  . CESAREAN SECTION  1986  . COLONOSCOPY W/ BIOPSIES AND POLYPECTOMY    . EYE SURGERY Bilateral 2012   "lasered a hole in my iris cause pressure was building up"  . LAPAROSCOPIC CHOLECYSTECTOMY  1990's  . NASAL SINUS SURGERY  1980's  . POSTERIOR LUMBAR FUSION  05/2013   L4-5; S1  . POSTERIOR LUMBAR FUSION  11/04/2014   L3-4  . SHOULDER ARTHROSCOPY W/ ROTATOR CUFF REPAIR Right 2012  . TUBAL LIGATION  1992    There were no vitals filed for this visit.   Subjective Assessment - 04/12/21 1349    Subjective Sunday night was on couch and stood up and felt a sharp  pain on Rt ant hip, eventually able to stand up. still very sore. recent weight loss.    Patient Stated Goals improve pain, will be new grandparents in April and hopes to be involved    Currently in Pain? Yes    Pain Score 5     Pain Location Hip    Pain Orientation Anterior    Pain Descriptors / Indicators Sore                             [facility] Adult PT Treatment/Exercise - 04/12/21 0001      Knee/Hip Exercises: Stretches   Passive Hamstring Stretch Limitations supine with strap, midline & lateral stretch      Knee/Hip Exercises: Sidelying   Clams regular & reverse 20 ea                    PT Short Term Goals - 02/08/21 1241      PT SHORT TERM GOAL #1   Title Pt will be ind with initial HEP     Time 3    Period Weeks    Status Achieved    Target Date 01/19/21      PT SHORT TERM GOAL #2   Title Pt will demo improved gait pattern to include improved weight shifting into Rt LE and reduce degree of Trendelenburg bil secondary to improved gluteal use in closed chain.    Time 4    Period Weeks    Status Partially Met    Target Date 01/26/21      PT SHORT TERM GOAL #3   Title Pt will report reduced pain with household ambulation by at least 30%    Time 4    Period Weeks    Status Achieved    Target Date 01/26/21      PT SHORT TERM GOAL #4   Title Pt will be able to sit in upright chair (vs recliner) for at least 15 mintues for mealtime with at least 30% less pain.    Baseline Was achieved but in the last week or so she has felt it again    Status On-going      PT SHORT TERM GOAL #5   Title Pt will demo 5x sit to stand without UE use in </= 13 sec    Baseline 15 sec, use of UEs; 9 sec no UEs 01/27/21    Time 4    Period Weeks    Status Achieved    Target Date 01/26/21             PT Long Term Goals - 03/22/21 1407      PT LONG TERM GOAL #1   Title Pt will report improved endurance for light ADLs and household ambulation for up to 20 min before needing seated break.    Baseline able to tolerate 15 mins    Status On-going      PT LONG TERM GOAL #2   Title Patient able to safely amb community distances using cane.    Baseline not neccesary distances for ADLs    Status On-going      PT LONG TERM GOAL #3   Title Improved Bil hip strength to >= 4+/5 to improve overall function    Baseline see flow sheet    Status On-going      PT LONG TERM GOAL #4   Title Improved 5x sit to stand to </= 11 sec  to reduce fall risk    Baseline 12 sec    Status On-going      PT LONG TERM GOAL #5   Title Improved FOTO to >/= 50% to demo improved function    Baseline unable to find in system    Status Deferred                 Plan - 04/12/21 1418    Clinical  Impression Statement time taken to assess abdominal pain. Rt lower quadrant TTP- unable to recreate with musculoskeletal stretch, resistance or compression. Denied TTP to inguinal ligament or pubic symphysis. Pain mid way between ASIS and naval with very gentle pressure required to recreate pain. will talk to dr Stephanie Acre about other possible tests as this has happened recently (last summer) and is experiencing weight loss with loss of appetite.    PT Treatment/Interventions ADLs/Self Care Home Management;Cryotherapy;Electrical Stimulation;Moist Heat;Iontophoresis 4mg /ml Dexamethasone;Gait training;Stair training;Functional mobility training;Therapeutic exercise;Therapeutic activities;Neuromuscular re-education;Balance training;Patient/family education;Energy conservation;Passive range of motion;Manual techniques;Taping;Spinal Manipulations;Joint Manipulations    PT Next Visit Plan cont shuttle, SLS if tolerated- f/u with dr Stephanie Acre    PT Home Exercise Plan Access Code: [email or web address]    IDHWYSHUO and Agree with Plan of Care Patient           Patient will benefit from skilled therapeutic intervention in order to improve the following deficits and impairments:  Abnormal gait,Decreased range of motion,Difficulty walking,Decreased endurance,Pain,Decreased activity tolerance,Impaired flexibility,Decreased balance,Decreased mobility,Decreased strength  Visit Diagnosis: Pain in right hip  Chronic bilateral low back pain, unspecified whether sciatica present  Muscle weakness (generalized)  Unsteadiness on feet     Problem List Patient Active Problem List   Diagnosis Date Noted  . Hypokalemia 01/12/2021  . Encounter for antineoplastic chemotherapy 11/18/2020  . Systemic lupus erythematosus ([place]) 11/03/2020  . Lupus nephritis ([place]) 11/03/2020  . Proteinuria 11/03/2020  . Other spondylosis with radiculopathy, lumbar region 12/09/2019  . Secondary adrenal insufficiency ([place]) 04/03/2019  . Metabolic  acidosis, normal anion gap (NAG) 04/02/2019  . HTN (hypertension)   . Adrenal insufficiency ([place]) 04/01/2019  . Hyponatremia 04/01/2019  . Hypothyroidism 04/01/2019  . AKI (acute kidney injury) ([place]) 04/01/2019  . Lumbar stenosis with neurogenic claudication 02/19/2017  . Migraine with aura and without status migrainosus, not intractable 07/11/2016  . Spondylolisthesis of lumbar region 11/04/2014  . Postconcussional syndrome 09/30/2013  . Post-concussion headache 09/30/2013  . Esophageal reflux 10/11/2011  . Benign neoplasm of colon 10/11/2011  . Gastritis, chronic 10/11/2011  . Guaiac + stool 09/28/2011  . IBS (irritable bowel syndrome) 09/28/2011  . GERD (gastroesophageal reflux disease) 09/28/2011  . Migraine syndrome 09/28/2011    Vanisha Whiten C. Aashrith Eves PT, DPT 04/12/21 3:22 PM   [facility] [place], [place], [address] Phone: [phone]   Fax:  [phone]  Name: Crystal Larsen MRN: [id number] Date of Birth: 03-Apr-1956

## 2021-04-14 ENCOUNTER — Ambulatory Visit (HOSPITAL_BASED_OUTPATIENT_CLINIC_OR_DEPARTMENT_OTHER): Payer: Medicare Other | Admitting: Physical Therapy

## 2021-04-19 ENCOUNTER — Encounter (HOSPITAL_BASED_OUTPATIENT_CLINIC_OR_DEPARTMENT_OTHER): Payer: Self-pay | Admitting: Physical Therapy

## 2021-04-19 ENCOUNTER — Other Ambulatory Visit: Payer: Self-pay

## 2021-04-19 ENCOUNTER — Ambulatory Visit (HOSPITAL_BASED_OUTPATIENT_CLINIC_OR_DEPARTMENT_OTHER): Payer: Medicare Other | Admitting: Physical Therapy

## 2021-04-19 DIAGNOSIS — M545 Low back pain, unspecified: Secondary | ICD-10-CM

## 2021-04-19 DIAGNOSIS — M6281 Muscle weakness (generalized): Secondary | ICD-10-CM

## 2021-04-19 DIAGNOSIS — M25551 Pain in right hip: Secondary | ICD-10-CM

## 2021-04-19 DIAGNOSIS — R2681 Unsteadiness on feet: Secondary | ICD-10-CM

## 2021-04-19 DIAGNOSIS — G8929 Other chronic pain: Secondary | ICD-10-CM

## 2021-04-19 NOTE — Therapy (Signed)
[place] [address] [place], [place], [address] Phone: [phone]   Fax:  [phone]  Physical Therapy Treatment  Patient Details  Name: Crystal Larsen MRN: [id number] Date of Birth: 1956/07/17 Referring Provider (PT): Rod Can, MD   Encounter Date: 04/19/2021   PT End of Session - 04/19/21 1024    Visit Number 28    Number of Visits 34    Date for PT Re-Evaluation 05/06/21    Authorization Type UHC Medicare    Progress Note Due on Visit 30    PT Start Time 1022    PT Stop Time 1100    PT Time Calculation (min) 38 min    Activity Tolerance Patient tolerated treatment well    Behavior During Therapy [facility] for tasks assessed/performed           Past Medical History:  Diagnosis Date  . Acute kidney injury ([place])   . Adrenal insufficiency ([place])   . Anemia   . Anxiety   . Arthritis    "left foot; left hand; right shoulder; back" (11/04/2014)  . Elevated liver enzymes   . Gallstones   . GERD (gastroesophageal reflux disease)   . History of gout   . HTN (hypertension)   . Hypothyroidism   . IBS (irritable bowel syndrome)    ?  [name] Lupus ([place]) 07/2019  . Metabolic acidosis   . Migraine    "maybe once/yr" (11/04/2014)  . Neuropathy    right leg  . Obesity   . Pancreatitis   . Pancreatitis   . PONV (postoperative nausea and vomiting) 1981 X 1   only time  . Seizures ([place]) 2003    due to tramadol reaction -- takes depakote for migraine prevention  . Sleep apnea    does not use cpap  . Spondylolisthesis of lumbar region   . Unsteady gait   . Wears glasses     Past Surgical History:  Procedure Laterality Date  . ANKLE GANGLION CYST EXCISION Right 1980's  . ANTERIOR LAT LUMBAR FUSION N/A 12/09/2019   Procedure: Lumbar One-Two Anterolateral lumbar decompression/interbody fusion with lateral plate;  Surgeon: Kristeen Miss, MD;  Location: [place];  Service: Neurosurgery;  Laterality: N/A;  Lumbar One-Two  Anterolateral lumbar decompression/interbody fusion with lateral plate  . BACK SURGERY    . BREAST SURGERY     biopsy  . BUNIONECTOMY Left 2012  . CARPAL TUNNEL RELEASE Right 05/22/2019   Procedure: RIGHT CARPAL TUNNEL RELEASE;  Surgeon: Daryll Brod, MD;  Location: [place];  Service: Orthopedics;  Laterality: Right;  . CARPOMETACARPEL SUSPENSION PLASTY Right 05/22/2019   Procedure: ARTHROPLASY CARPOMETACARPAL RIGHT THUMB TRAPEZIUM EXCISION TENDON TRANSFER MICRO LINK SUSPENSION;  Surgeon: Daryll Brod, MD;  Location: [place];  Service: Orthopedics;  Laterality: Right;  . CESAREAN SECTION  1986  . COLONOSCOPY W/ BIOPSIES AND POLYPECTOMY    . EYE SURGERY Bilateral 2012   "lasered a hole in my iris cause pressure was building up"  . LAPAROSCOPIC CHOLECYSTECTOMY  1990's  . NASAL SINUS SURGERY  1980's  . POSTERIOR LUMBAR FUSION  05/2013   L4-5; S1  . POSTERIOR LUMBAR FUSION  11/04/2014   L3-4  . SHOULDER ARTHROSCOPY W/ ROTATOR CUFF REPAIR Right 2012  . TUBAL LIGATION  1992    There were no vitals filed for this visit.   Subjective Assessment - 04/19/21 1022    Subjective Started feeling better by Sunday. Hip is not hurting as bad.  Patient Stated Goals improve pain, will be new grandparents in April and hopes to be involved    Currently in Pain? No/denies                             [facility] Adult PT Treatment/Exercise - 04/19/21 0001      Knee/Hip Exercises: Stretches   Piriformis Stretch Limitations figure 4 pull across      Knee/Hip Exercises: Machines for Strengthening   Other Machine shuttle: neutral press & turnout 3*25;      Knee/Hip Exercises: Supine   Bridges with Clamshell 15 reps   blue tband   Straight Leg Raises Limitations to height of opp knee- blue tband press out      Knee/Hip Exercises: Sidelying   Clams *25 ea      Manual Therapy   Soft tissue mobilization Rt piriformis                    PT  Short Term Goals - 02/08/21 1241      PT SHORT TERM GOAL #1   Title Pt will be ind with initial HEP    Time 3    Period Weeks    Status Achieved    Target Date 01/19/21      PT SHORT TERM GOAL #2   Title Pt will demo improved gait pattern to include improved weight shifting into Rt LE and reduce degree of Trendelenburg bil secondary to improved gluteal use in closed chain.    Time 4    Period Weeks    Status Partially Met    Target Date 01/26/21      PT SHORT TERM GOAL #3   Title Pt will report reduced pain with household ambulation by at least 30%    Time 4    Period Weeks    Status Achieved    Target Date 01/26/21      PT SHORT TERM GOAL #4   Title Pt will be able to sit in upright chair (vs recliner) for at least 15 mintues for mealtime with at least 30% less pain.    Baseline Was achieved but in the last week or so she has felt it again    Status On-going      PT SHORT TERM GOAL #5   Title Pt will demo 5x sit to stand without UE use in </= 13 sec    Baseline 15 sec, use of UEs; 9 sec no UEs 01/27/21    Time 4    Period Weeks    Status Achieved    Target Date 01/26/21             PT Long Term Goals - 03/22/21 1407      PT LONG TERM GOAL #1   Title Pt will report improved endurance for light ADLs and household ambulation for up to 20 min before needing seated break.    Baseline able to tolerate 15 mins    Status On-going      PT LONG TERM GOAL #2   Title Patient able to safely amb community distances using cane.    Baseline not neccesary distances for ADLs    Status On-going      PT LONG TERM GOAL #3   Title Improved Bil hip strength to >= 4+/5 to improve overall function    Baseline see flow sheet    Status On-going      PT LONG  TERM GOAL #4   Title Improved 5x sit to stand to </= 11 sec to reduce fall risk    Baseline 12 sec    Status On-going      PT LONG TERM GOAL #5   Title Improved FOTO to >/= 50% to demo improved function    Baseline unable to  find in system    Status Deferred                 Plan - 04/19/21 1042    Clinical Impression Statement Unable to cont shuttle in SL today due to catch in Rt post hip- found trigger point in piriformis and reduced with manual therapy. Able to perform abd group activation without catch following. Altered HEP and incr core stress in table exercises today for control challenge. Pt feels as though she had a setback today and was encouraged to not be disappointed as she is making great progress thus far.    PT Treatment/Interventions ADLs/Self Care Home Management;Cryotherapy;Electrical Stimulation;Moist Heat;Iontophoresis 54m/ml Dexamethasone;Gait training;Stair training;Functional mobility training;Therapeutic exercise;Therapeutic activities;Neuromuscular re-education;Balance training;Patient/family education;Energy conservation;Passive range of motion;Manual techniques;Taping;Spinal Manipulations;Joint Manipulations    PT Next Visit Plan SLS, fwd & lateral steps    PT Home Exercise Plan Access Code: [email or web address]   Consulted and Agree with Plan of Care Patient           Patient will benefit from skilled therapeutic intervention in order to improve the following deficits and impairments:  Abnormal gait,Decreased range of motion,Difficulty walking,Decreased endurance,Pain,Decreased activity tolerance,Impaired flexibility,Decreased balance,Decreased mobility,Decreased strength  Visit Diagnosis: Pain in right hip  Chronic bilateral low back pain, unspecified whether sciatica present  Muscle weakness (generalized)  Unsteadiness on feet     Problem List Patient Active Problem List   Diagnosis Date Noted  . Hypokalemia 01/12/2021  . Encounter for antineoplastic chemotherapy 11/18/2020  . Systemic lupus erythematosus ([place] 11/03/2020  . Lupus nephritis ([place] 11/03/2020  . Proteinuria 11/03/2020  . Other spondylosis with radiculopathy, lumbar region 12/09/2019  . Secondary adrenal  insufficiency ([place] 04/03/2019  . Metabolic acidosis, normal anion gap (NAG) 04/02/2019  . HTN (hypertension)   . Adrenal insufficiency ([place] 04/01/2019  . Hyponatremia 04/01/2019  . Hypothyroidism 04/01/2019  . AKI (acute kidney injury) ([place] 04/01/2019  . Lumbar stenosis with neurogenic claudication 02/19/2017  . Migraine with aura and without status migrainosus, not intractable 07/11/2016  . Spondylolisthesis of lumbar region 11/04/2014  . Postconcussional syndrome 09/30/2013  . Post-concussion headache 09/30/2013  . Esophageal reflux 10/11/2011  . Benign neoplasm of colon 10/11/2011  . Gastritis, chronic 10/11/2011  . Guaiac + stool 09/28/2011  . IBS (irritable bowel syndrome) 09/28/2011  . GERD (gastroesophageal reflux disease) 09/28/2011  . Migraine syndrome 09/28/2011   Demarius Archila C. Asiya Cutbirth PT, DPT 04/19/21 11:49 AM   [place] [address]Phone: [phone]  Fax:  [phone] Name: KGETHSEMANE FISCHLERMRN: [id number] of Birth: 1July 22, 1957

## 2021-04-22 ENCOUNTER — Ambulatory Visit (HOSPITAL_BASED_OUTPATIENT_CLINIC_OR_DEPARTMENT_OTHER): Payer: Medicare Other | Admitting: Physical Therapy

## 2021-04-22 DIAGNOSIS — R2681 Unsteadiness on feet: Secondary | ICD-10-CM

## 2021-04-22 DIAGNOSIS — G8929 Other chronic pain: Secondary | ICD-10-CM

## 2021-04-22 DIAGNOSIS — M25551 Pain in right hip: Secondary | ICD-10-CM | POA: Diagnosis not present

## 2021-04-22 DIAGNOSIS — M6281 Muscle weakness (generalized): Secondary | ICD-10-CM

## 2021-04-22 NOTE — Therapy (Signed)
[facility] [address] [place], [place], [address] Phone: [phone]   Fax:  [phone]  Physical Therapy Treatment  Patient Details  Name: Crystal Larsen MRN: [id number] Date of Birth: 10-01-1956 Referring Provider (PT): Rod Can, MD   Encounter Date: 04/22/2021   PT End of Session - 04/22/21 1152    Visit Number 29    Number of Visits 34    Date for PT Re-Evaluation 05/06/21    Authorization Type [employer] Medicare    Progress Note Due on Visit 30    PT Start Time 1145    PT Stop Time 1228    PT Time Calculation (min) 43 min    Activity Tolerance Patient tolerated treatment well    Behavior During Therapy [facility] for tasks assessed/performed           Past Medical History:  Diagnosis Date  . Acute kidney injury ([place])   . Adrenal insufficiency ([place])   . Anemia   . Anxiety   . Arthritis    "left foot; left hand; right shoulder; back" (11/04/2014)  . Elevated liver enzymes   . Gallstones   . GERD (gastroesophageal reflux disease)   . History of gout   . HTN (hypertension)   . Hypothyroidism   . IBS (irritable bowel syndrome)    ?  [name] Lupus ([place]) 07/2019  . Metabolic acidosis   . Migraine    "maybe once/yr" (11/04/2014)  . Neuropathy    right leg  . Obesity   . Pancreatitis   . Pancreatitis   . PONV (postoperative nausea and vomiting) 1981 X 1   only time  . Seizures ([place]) 2003    due to tramadol reaction -- takes depakote for migraine prevention  . Sleep apnea    does not use cpap  . Spondylolisthesis of lumbar region   . Unsteady gait   . Wears glasses     Past Surgical History:  Procedure Laterality Date  . ANKLE GANGLION CYST EXCISION Right 1980's  . ANTERIOR LAT LUMBAR FUSION N/A 12/09/2019   Procedure: Lumbar One-Two Anterolateral lumbar decompression/interbody fusion with lateral plate;  Surgeon: Kristeen Miss, MD;  Location: [place];  Service: Neurosurgery;  Laterality: N/A;  Lumbar One-Two  Anterolateral lumbar decompression/interbody fusion with lateral plate  . BACK SURGERY    . BREAST SURGERY     biopsy  . BUNIONECTOMY Left 2012  . CARPAL TUNNEL RELEASE Right 05/22/2019   Procedure: RIGHT CARPAL TUNNEL RELEASE;  Surgeon: Daryll Brod, MD;  Location: [place];  Service: Orthopedics;  Laterality: Right;  . CARPOMETACARPEL SUSPENSION PLASTY Right 05/22/2019   Procedure: ARTHROPLASY CARPOMETACARPAL RIGHT THUMB TRAPEZIUM EXCISION TENDON TRANSFER MICRO LINK SUSPENSION;  Surgeon: Daryll Brod, MD;  Location: [place];  Service: Orthopedics;  Laterality: Right;  . CESAREAN SECTION  1986  . COLONOSCOPY W/ BIOPSIES AND POLYPECTOMY    . EYE SURGERY Bilateral 2012   "lasered a hole in my iris cause pressure was building up"  . LAPAROSCOPIC CHOLECYSTECTOMY  1990's  . NASAL SINUS SURGERY  1980's  . POSTERIOR LUMBAR FUSION  05/2013   L4-5; S1  . POSTERIOR LUMBAR FUSION  11/04/2014   L3-4  . SHOULDER ARTHROSCOPY W/ ROTATOR CUFF REPAIR Right 2012  . TUBAL LIGATION  1992    There were no vitals filed for this visit.                      [facility] Adult  PT Treatment/Exercise - 04/22/21 0001      Knee/Hip Exercises: Aerobic   Nustep 5 min L6 UE & LE      Knee/Hip Exercises: Standing   SLS single foot on 4" step working weight to standing leg    Other Standing Knee Exercises step taps 2" step, bil hand hold, cues to shift weight   difficulty lifting Rt LE   Other Standing Knee Exercises march, yellow tband on feet, cues for weight shift      Knee/Hip Exercises: Seated   Marching Limitations difficulty lifting Lt LE- opp from standing difficulty      Knee/Hip Exercises: Supine   Straight Leg Raises Limitations scissors    Other Supine Knee/Hip Exercises march to knee ext reach    Other Supine Knee/Hip Exercises march- knee ext-[id number]                    PT Short Term Goals - 02/08/21 1241      PT SHORT TERM GOAL #1   Title  Pt will be ind with initial HEP    Time 3    Period Weeks    Status Achieved    Target Date 01/19/21      PT SHORT TERM GOAL #2   Title Pt will demo improved gait pattern to include improved weight shifting into Rt LE and reduce degree of Trendelenburg bil secondary to improved gluteal use in closed chain.    Time 4    Period Weeks    Status Partially Met    Target Date 01/26/21      PT SHORT TERM GOAL #3   Title Pt will report reduced pain with household ambulation by at least 30%    Time 4    Period Weeks    Status Achieved    Target Date 01/26/21      PT SHORT TERM GOAL #4   Title Pt will be able to sit in upright chair (vs recliner) for at least 15 mintues for mealtime with at least 30% less pain.    Baseline Was achieved but in the last week or so she has felt it again    Status On-going      PT SHORT TERM GOAL #5   Title Pt will demo 5x sit to stand without UE use in </= 13 sec    Baseline 15 sec, use of UEs; 9 sec no UEs 01/27/21    Time 4    Period Weeks    Status Achieved    Target Date 01/26/21             PT Long Term Goals - 03/22/21 1407      PT LONG TERM GOAL #1   Title Pt will report improved endurance for light ADLs and household ambulation for up to 20 min before needing seated break.    Baseline able to tolerate 15 mins    Status On-going      PT LONG TERM GOAL #2   Title Patient able to safely amb community distances using cane.    Baseline not neccesary distances for ADLs    Status On-going      PT LONG TERM GOAL #3   Title Improved Bil hip strength to >= 4+/5 to improve overall function    Baseline see flow sheet    Status On-going      PT LONG TERM GOAL #4   Title Improved 5x sit to stand to </= 11 sec to reduce  fall risk    Baseline 12 sec    Status On-going      PT LONG TERM GOAL #5   Title Improved FOTO to >/= 50% to demo improved function    Baseline unable to find in system    Status Deferred                 Plan -  04/22/21 1250    Clinical Impression Statement Seeing rheumatologist soon to evaluate lupus-type symptoms. Had difficulty lifting Rt leg in standing, Lt leg in seated and neither were difficult in supine. Added resisted march in standing with UE support- main focus on keeping level pelvis and controlled lift of LEs.    PT Treatment/Interventions ADLs/Self Care Home Management;Cryotherapy;Electrical Stimulation;Moist Heat;Iontophoresis 74m/ml Dexamethasone;Gait training;Stair training;Functional mobility training;Therapeutic exercise;Therapeutic activities;Neuromuscular re-education;Balance training;Patient/family education;Energy conservation;Passive range of motion;Manual techniques;Taping;Spinal Manipulations;Joint Manipulations    PT Next Visit Plan SLS, fwd & lateral steps    PT Home Exercise Plan Access Code: [email or web address]   Consulted and Agree with Plan of Care Patient           Patient will benefit from skilled therapeutic intervention in order to improve the following deficits and impairments:  Abnormal gait,Decreased range of motion,Difficulty walking,Decreased endurance,Pain,Decreased activity tolerance,Impaired flexibility,Decreased balance,Decreased mobility,Decreased strength  Visit Diagnosis: Pain in right hip  Chronic bilateral low back pain, unspecified whether sciatica present  Muscle weakness (generalized)  Unsteadiness on feet     Problem List Patient Active Problem List   Diagnosis Date Noted  . Hypokalemia 01/12/2021  . Encounter for antineoplastic chemotherapy 11/18/2020  . Systemic lupus erythematosus ([place] 11/03/2020  . Lupus nephritis ([place] 11/03/2020  . Proteinuria 11/03/2020  . Other spondylosis with radiculopathy, lumbar region 12/09/2019  . Secondary adrenal insufficiency ([place] 04/03/2019  . Metabolic acidosis, normal anion gap (NAG) 04/02/2019  . HTN (hypertension)   . Adrenal insufficiency ([place] 04/01/2019  . Hyponatremia 04/01/2019  . Hypothyroidism  04/01/2019  . AKI (acute kidney injury) ([place] 04/01/2019  . Lumbar stenosis with neurogenic claudication 02/19/2017  . Migraine with aura and without status migrainosus, not intractable 07/11/2016  . Spondylolisthesis of lumbar region 11/04/2014  . Postconcussional syndrome 09/30/2013  . Post-concussion headache 09/30/2013  . Esophageal reflux 10/11/2011  . Benign neoplasm of colon 10/11/2011  . Gastritis, chronic 10/11/2011  . Guaiac + stool 09/28/2011  . IBS (irritable bowel syndrome) 09/28/2011  . GERD (gastroesophageal reflux disease) 09/28/2011  . Migraine syndrome 09/28/2011   Millenia Waldvogel C. Isabell Bonafede PT, DPT 04/22/21 4:24 PM   [facility] [address] [address]Phone: [phone]  Fax:  [phone] Name: KMARKESHIA GIEBELMRN: [id number] of Birth: 102-15-1957

## 2021-04-26 ENCOUNTER — Ambulatory Visit (HOSPITAL_BASED_OUTPATIENT_CLINIC_OR_DEPARTMENT_OTHER): Payer: Medicare Other | Admitting: Physical Therapy

## 2021-04-26 ENCOUNTER — Encounter (HOSPITAL_BASED_OUTPATIENT_CLINIC_OR_DEPARTMENT_OTHER): Payer: Self-pay | Admitting: Physical Therapy

## 2021-04-26 ENCOUNTER — Other Ambulatory Visit: Payer: Self-pay

## 2021-04-26 DIAGNOSIS — M25551 Pain in right hip: Secondary | ICD-10-CM | POA: Diagnosis not present

## 2021-04-26 DIAGNOSIS — R2681 Unsteadiness on feet: Secondary | ICD-10-CM

## 2021-04-26 DIAGNOSIS — M545 Low back pain, unspecified: Secondary | ICD-10-CM

## 2021-04-26 DIAGNOSIS — M6281 Muscle weakness (generalized): Secondary | ICD-10-CM

## 2021-04-26 NOTE — Therapy (Signed)
[facility] [place], [place], [address] Phone: [phone]   Fax:  [phone]  Physical Therapy Treatment Progress Note Reporting Period 12/29/20 to 04/26/2021   See note below for Objective Data and Assessment of Progress/Goals.       Patient Details  Name: Crystal Larsen MRN: [id number] Date of Birth: 05/03/1956 Referring Provider (PT): Rod Can, MD   Encounter Date: 04/26/2021   PT End of Session - 04/26/21 1305    Visit Number 30    Number of Visits 40    Date for PT Re-Evaluation 07/04/21    Authorization Type UHC Medicare    Progress Note Due on Visit 56    PT Start Time 1302    PT Stop Time 1343    PT Time Calculation (min) 41 min    Activity Tolerance Patient tolerated treatment well    Behavior During Therapy [facility] for tasks assessed/performed           Past Medical History:  Diagnosis Date  . Acute kidney injury ([place])   . Adrenal insufficiency ([place])   . Anemia   . Anxiety   . Arthritis    "left foot; left hand; right shoulder; back" (11/04/2014)  . Elevated liver enzymes   . Gallstones   . GERD (gastroesophageal reflux disease)   . History of gout   . HTN (hypertension)   . Hypothyroidism   . IBS (irritable bowel syndrome)    ?  [name] Lupus ([place]) 07/2019  . Metabolic acidosis   . Migraine    "maybe once/yr" (11/04/2014)  . Neuropathy    right leg  . Obesity   . Pancreatitis   . Pancreatitis   . PONV (postoperative nausea and vomiting) 1981 X 1   only time  . Seizures ([place]) 2003    due to tramadol reaction -- takes depakote for migraine prevention  . Sleep apnea    does not use cpap  . Spondylolisthesis of lumbar region   . Unsteady gait   . Wears glasses     Past Surgical History:  Procedure Laterality Date  . ANKLE GANGLION CYST EXCISION Right 1980's  . ANTERIOR LAT LUMBAR FUSION N/A 12/09/2019   Procedure: Lumbar One-Two Anterolateral lumbar  decompression/interbody fusion with lateral plate;  Surgeon: Kristeen Miss, MD;  Location: [place];  Service: Neurosurgery;  Laterality: N/A;  Lumbar One-Two Anterolateral lumbar decompression/interbody fusion with lateral plate  . BACK SURGERY    . BREAST SURGERY     biopsy  . BUNIONECTOMY Left 2012  . CARPAL TUNNEL RELEASE Right 05/22/2019   Procedure: RIGHT CARPAL TUNNEL RELEASE;  Surgeon: Daryll Brod, MD;  Location: [place];  Service: Orthopedics;  Laterality: Right;  . CARPOMETACARPEL SUSPENSION PLASTY Right 05/22/2019   Procedure: ARTHROPLASY CARPOMETACARPAL RIGHT THUMB TRAPEZIUM EXCISION TENDON TRANSFER MICRO LINK SUSPENSION;  Surgeon: Daryll Brod, MD;  Location: [place];  Service: Orthopedics;  Laterality: Right;  . CESAREAN SECTION  1986  . COLONOSCOPY W/ BIOPSIES AND POLYPECTOMY    . EYE SURGERY Bilateral 2012   "lasered a hole in my iris cause pressure was building up"  . LAPAROSCOPIC CHOLECYSTECTOMY  1990's  . NASAL SINUS SURGERY  1980's  . POSTERIOR LUMBAR FUSION  05/2013   L4-5; S1  . POSTERIOR LUMBAR FUSION  11/04/2014   L3-4  . SHOULDER ARTHROSCOPY W/ ROTATOR CUFF REPAIR Right 2012  . TUBAL LIGATION  1992    There were no vitals filed for this  visit.   Subjective Assessment - 04/26/21 1304    Subjective Felt better yesterday than today- got a lot done yesterday. My pain has gotten a lot better, the knot in my hip comes and goes every now and then. I am still walking like a penguin when I do not have my cane but I do feel like I have improved, just have to think about what you have taught me. Strength is not quite there- some points in a day I am struggling to move around and others I can walk almost normally- no notable consistency in time of day. I was able to change sheets, clean bathroom and did exercises yesterday AM which is the first time I have been able to do that in a long time- was exhausted after.    Pertinent History Lupus, lumbar  fusion L2-S1 (4 surgeries)    How long can you sit comfortably? 10-15 min and then will get discomfort in Rt buttock    Diagnostic tests lumbar MRI 2021: mild retrolisthesis L1/2 with some foraminal narrowing    Patient Stated Goals improve pain, will be new grandparents in April and hopes to be involved    Currently in Pain? No/denies    Aggravating Factors  not stretching, sitting without being reclined    Pain Relieving Factors sitting in recliner              [facility] PT Assessment - 04/26/21 0001      Assessment   Medical Diagnosis Rt trochanteric bursitis    Referring Provider (PT) Swinteck, Aaron Edelman, MD      Strength   Right Hip ABduction 4/5    Left Hip ABduction 4/5      6 minute walk test results    Aerobic Endurance Distance NOMVEH 209    Endurance additional comments without cane                                 PT Education - 04/26/21 1355    Education Details PN- goals and testing    Person(s) Educated Patient    Methods Explanation    Comprehension Verbalized understanding;Need further instruction            PT Short Term Goals - 02/08/21 1241      PT SHORT TERM GOAL #1   Title Pt will be ind with initial HEP    Time 3    Period Weeks    Status Achieved    Target Date 01/19/21      PT SHORT TERM GOAL #2   Title Pt will demo improved gait pattern to include improved weight shifting into Rt LE and reduce degree of Trendelenburg bil secondary to improved gluteal use in closed chain.    Time 4    Period Weeks    Status Partially Met    Target Date 01/26/21      PT SHORT TERM GOAL #3   Title Pt will report reduced pain with household ambulation by at least 30%    Time 4    Period Weeks    Status Achieved    Target Date 01/26/21      PT SHORT TERM GOAL #4   Title Pt will be able to sit in upright chair (vs recliner) for at least 15 mintues for mealtime with at least 30% less pain.    Baseline Was achieved but in the last week or  so she has  felt it again    Status On-going      PT SHORT TERM GOAL #5   Title Pt will demo 5x sit to stand without UE use in </= 13 sec    Baseline 15 sec, use of UEs; 9 sec no UEs 01/27/21    Time 4    Period Weeks    Status Achieved    Target Date 01/26/21             PT Long Term Goals - 04/26/21 1306      PT LONG TERM GOAL #1   Title Pt will report improved endurance for light ADLs and household ambulation for up to 20 min before needing seated break.    Baseline was able this weekend around 1.5 hr    Status Achieved      PT LONG TERM GOAL #2   Title Patient able to safely amb community distances using cane.    Baseline unable to complete [employer] distances, using motorized scooter; able to use cane for trip to just the pharmacy    Status On-going      PT LONG TERM GOAL #3   Title Improved Bil hip strength to >= 4+/5 to improve overall function    Baseline see flowsheet    Status On-going      PT LONG TERM GOAL #4   Title Improved 5x sit to stand to </= 11 sec to reduce fall risk    Baseline 11s    Status Achieved      PT LONG TERM GOAL #5   Title Improved FOTO to >/= 50% to demo improved function    Status Deferred      Additional Long Term Goals   Additional Long Term Goals Yes      PT LONG TERM GOAL #6   Title ambulate normally without cane    Baseline 6MWT goal of 1765 without [facility]    Status New    Target Date 07/04/21                 Plan - 04/26/21 1348    Clinical Impression Statement Progress note today to evaluate changes in function since beginning PT. Overall strength has improved and gait pattern, with cane, is much better than when I first began treating her. Trendelenburg is still notable but is able to maintain neutral step width with minimal cuing. Forgets to swing her arms when walking without SPC in attempt to improve balance. She did not require assistance in her gait without SPC but my hand was on the gait belt which improved her  confidence in her walking. She ambulated 656 feet and age appropraite would be 1765 ft. I advised that I do not think it would be reasonable to set a goal of community ambulation without [facility] but household distances as set in 6MWT distances are appropriate to work toward. She will continue to work on strength and flexibility exercises at home so we can focus on gait while in clinic.    PT Treatment/Interventions ADLs/Self Care Home Management;Cryotherapy;Electrical Stimulation;Moist Heat;Iontophoresis 4mg /ml Dexamethasone;Gait training;Stair training;Functional mobility training;Therapeutic exercise;Therapeutic activities;Neuromuscular re-education;Balance training;Patient/family education;Energy conservation;Passive range of motion;Manual techniques;Taping;Spinal Manipulations;Joint Manipulations    PT Next Visit Plan SLS, fwd & lateral steps, gait on track without [facility]    PT Home Exercise Plan Access Code: [email or web address]    Consulted and Agree with Plan of Care Patient           Patient will benefit from skilled therapeutic intervention in  order to improve the following deficits and impairments:  Abnormal gait,Decreased range of motion,Difficulty walking,Decreased endurance,Pain,Decreased activity tolerance,Impaired flexibility,Decreased balance,Decreased mobility,Decreased strength  Visit Diagnosis: Pain in right hip - Plan: PT plan of care cert/re-cert  Chronic bilateral low back pain, unspecified whether sciatica present - Plan: PT plan of care cert/re-cert  Muscle weakness (generalized) - Plan: PT plan of care cert/re-cert  Unsteadiness on feet - Plan: PT plan of care cert/re-cert     Problem List Patient Active Problem List   Diagnosis Date Noted  . Hypokalemia 01/12/2021  . Encounter for antineoplastic chemotherapy 11/18/2020  . Systemic lupus erythematosus ([place]) 11/03/2020  . Lupus nephritis ([place]) 11/03/2020  . Proteinuria 11/03/2020  . Other spondylosis with radiculopathy, lumbar  region 12/09/2019  . Secondary adrenal insufficiency ([place]) 04/03/2019  . Metabolic acidosis, normal anion gap (NAG) 04/02/2019  . HTN (hypertension)   . Adrenal insufficiency ([place]) 04/01/2019  . Hyponatremia 04/01/2019  . Hypothyroidism 04/01/2019  . AKI (acute kidney injury) ([place]) 04/01/2019  . Lumbar stenosis with neurogenic claudication 02/19/2017  . Migraine with aura and without status migrainosus, not intractable 07/11/2016  . Spondylolisthesis of lumbar region 11/04/2014  . Postconcussional syndrome 09/30/2013  . Post-concussion headache 09/30/2013  . Esophageal reflux 10/11/2011  . Benign neoplasm of colon 10/11/2011  . Gastritis, chronic 10/11/2011  . Guaiac + stool 09/28/2011  . IBS (irritable bowel syndrome) 09/28/2011  . GERD (gastroesophageal reflux disease) 09/28/2011  . Migraine syndrome 09/28/2011    Mehar Kirkwood C. Jobany Montellano PT, DPT 04/26/21 2:10 PM   [facility] [address] [place], [place], [address] Phone: [phone]   Fax:  [phone]  Name: Crystal Larsen MRN: [id number] Date of Birth: [birth year]

## 2021-04-29 ENCOUNTER — Ambulatory Visit (HOSPITAL_BASED_OUTPATIENT_CLINIC_OR_DEPARTMENT_OTHER): Payer: Medicare Other | Admitting: Physical Therapy

## 2021-05-03 ENCOUNTER — Encounter (HOSPITAL_BASED_OUTPATIENT_CLINIC_OR_DEPARTMENT_OTHER): Payer: Self-pay | Admitting: Physical Therapy

## 2021-05-03 ENCOUNTER — Other Ambulatory Visit: Payer: Self-pay

## 2021-05-03 ENCOUNTER — Ambulatory Visit (HOSPITAL_BASED_OUTPATIENT_CLINIC_OR_DEPARTMENT_OTHER): Payer: Medicare Other | Attending: Orthopedic Surgery | Admitting: Physical Therapy

## 2021-05-03 DIAGNOSIS — G8929 Other chronic pain: Secondary | ICD-10-CM | POA: Diagnosis present

## 2021-05-03 DIAGNOSIS — M6281 Muscle weakness (generalized): Secondary | ICD-10-CM | POA: Insufficient documentation

## 2021-05-03 DIAGNOSIS — M25551 Pain in right hip: Secondary | ICD-10-CM | POA: Insufficient documentation

## 2021-05-03 DIAGNOSIS — M545 Low back pain, unspecified: Secondary | ICD-10-CM | POA: Diagnosis present

## 2021-05-03 DIAGNOSIS — R2681 Unsteadiness on feet: Secondary | ICD-10-CM | POA: Diagnosis present

## 2021-05-03 NOTE — Therapy (Signed)
[place], [place], [address] Phone: [phone]   Fax:  [phone]  Physical Therapy Treatment  Patient Details  Name: Crystal Larsen MRN: [id number] Date of Birth: 1956-09-07 Referring Provider (PT): Rod Can, MD   Encounter Date: 05/03/2021   PT End of Session - 05/03/21 1151    Visit Number 31    Number of Visits 40    Date for PT Re-Evaluation 07/04/21    Authorization Type UHC Medicare    Progress Note Due on Visit 40    PT Start Time 1145    PT Stop Time 1230    PT Time Calculation (min) 45 min    Activity Tolerance Patient tolerated treatment well    Behavior During Therapy [facility] for tasks assessed/performed           Past Medical History:  Diagnosis Date  . Acute kidney injury ([place])   . Adrenal insufficiency ([place])   . Anemia   . Anxiety   . Arthritis    "left foot; left hand; right shoulder; back" (11/04/2014)  . Elevated liver enzymes   . Gallstones   . GERD (gastroesophageal reflux disease)   . History of gout   . HTN (hypertension)   . Hypothyroidism   . IBS (irritable bowel syndrome)    ?  [name] Lupus ([place]) 07/2019  . Metabolic acidosis   . Migraine    "maybe once/yr" (11/04/2014)  . Neuropathy    right leg  . Obesity   . Pancreatitis   . Pancreatitis   . PONV (postoperative nausea and vomiting) 1981 X 1   only time  . Seizures ([place]) 2003    due to tramadol reaction -- takes depakote for migraine prevention  . Sleep apnea    does not use cpap  . Spondylolisthesis of lumbar region   . Unsteady gait   . Wears glasses     Past Surgical History:  Procedure Laterality Date  . ANKLE GANGLION CYST EXCISION Right 1980's  . ANTERIOR LAT LUMBAR FUSION N/A 12/09/2019   Procedure: Lumbar One-Two Anterolateral lumbar decompression/interbody fusion with lateral plate;  Surgeon: Kristeen Miss, MD;  Location: [place];  Service: Neurosurgery;  Laterality: N/A;  Lumbar One-Two  Anterolateral lumbar decompression/interbody fusion with lateral plate  . BACK SURGERY    . BREAST SURGERY     biopsy  . BUNIONECTOMY Left 2012  . CARPAL TUNNEL RELEASE Right 05/22/2019   Procedure: RIGHT CARPAL TUNNEL RELEASE;  Surgeon: Daryll Brod, MD;  Location: [place];  Service: Orthopedics;  Laterality: Right;  . CARPOMETACARPEL SUSPENSION PLASTY Right 05/22/2019   Procedure: ARTHROPLASY CARPOMETACARPAL RIGHT THUMB TRAPEZIUM EXCISION TENDON TRANSFER MICRO LINK SUSPENSION;  Surgeon: Daryll Brod, MD;  Location: [place];  Service: Orthopedics;  Laterality: Right;  . CESAREAN SECTION  1986  . COLONOSCOPY W/ BIOPSIES AND POLYPECTOMY    . EYE SURGERY Bilateral 2012   "lasered a hole in my iris cause pressure was building up"  . LAPAROSCOPIC CHOLECYSTECTOMY  1990's  . NASAL SINUS SURGERY  1980's  . POSTERIOR LUMBAR FUSION  05/2013   L4-5; S1  . POSTERIOR LUMBAR FUSION  11/04/2014   L3-4  . SHOULDER ARTHROSCOPY W/ ROTATOR CUFF REPAIR Right 2012  . TUBAL LIGATION  1992    There were no vitals filed for this visit.   Subjective Assessment - 05/03/21 1152    Subjective I notice I drop it every now and then even when I  am not tired.    Patient Stated Goals improve pain, will be new grandparents in April and hopes to be involved    Currently in Pain? No/denies                             [facility] Adult PT Treatment/Exercise - 05/03/21 0001      Knee/Hip Exercises: Standing   Lateral Step Up Both;Step Height: 4";Hand Hold: 1    Forward Step Up Step Height: 4";Hand Hold: 1;Both    Gait Training without cane    Other Standing Knee Exercises lateral lunges                    PT Short Term Goals - 02/08/21 1241      PT SHORT TERM GOAL #1   Title Pt will be ind with initial HEP    Time 3    Period Weeks    Status Achieved    Target Date 01/19/21      PT SHORT TERM GOAL #2   Title Pt will demo improved gait pattern to  include improved weight shifting into Rt LE and reduce degree of Trendelenburg bil secondary to improved gluteal use in closed chain.    Time 4    Period Weeks    Status Partially Met    Target Date 01/26/21      PT SHORT TERM GOAL #3   Title Pt will report reduced pain with household ambulation by at least 30%    Time 4    Period Weeks    Status Achieved    Target Date 01/26/21      PT SHORT TERM GOAL #4   Title Pt will be able to sit in upright chair (vs recliner) for at least 15 mintues for mealtime with at least 30% less pain.    Baseline Was achieved but in the last week or so she has felt it again    Status On-going      PT SHORT TERM GOAL #5   Title Pt will demo 5x sit to stand without UE use in </= 13 sec    Baseline 15 sec, use of UEs; 9 sec no UEs 01/27/21    Time 4    Period Weeks    Status Achieved    Target Date 01/26/21             PT Long Term Goals - 04/26/21 1306      PT LONG TERM GOAL #1   Title Pt will report improved endurance for light ADLs and household ambulation for up to 20 min before needing seated break.    Baseline was able this weekend around 1.5 hr    Status Achieved      PT LONG TERM GOAL #2   Title Patient able to safely amb community distances using cane.    Baseline unable to complete [employer] distances, using motorized scooter; able to use cane for trip to just the pharmacy    Status On-going      PT LONG TERM GOAL #3   Title Improved Bil hip strength to >= 4+/5 to improve overall function    Baseline see flowsheet    Status On-going      PT LONG TERM GOAL #4   Title Improved 5x sit to stand to </= 11 sec to reduce fall risk    Baseline 11s    Status Achieved  PT LONG TERM GOAL #5   Title Improved FOTO to >/= 50% to demo improved function    Status Deferred      Additional Long Term Goals   Additional Long Term Goals Yes      PT LONG TERM GOAL #6   Title ambulate normally without cane    Baseline 6MWT goal of 1765  without [facility]    Status New    Target Date 07/04/21                 Plan - 05/03/21 1558    Clinical Impression Statement Gait was challenged without AD today. She does have the strength to maintain a level pelvis in walking but is still learning coordinated movements and improving confidence.    PT Treatment/Interventions ADLs/Self Care Home Management;Cryotherapy;Electrical Stimulation;Moist Heat;Iontophoresis 9m/ml Dexamethasone;Gait training;Stair training;Functional mobility training;Therapeutic exercise;Therapeutic activities;Neuromuscular re-education;Balance training;Patient/family education;Energy conservation;Passive range of motion;Manual techniques;Taping;Spinal Manipulations;Joint Manipulations    PT Next Visit Plan cont gait without AD    PT Home Exercise Plan Access Code: [email or web address]   Consulted and Agree with Plan of Care Patient           Patient will benefit from skilled therapeutic intervention in order to improve the following deficits and impairments:  Abnormal gait,Decreased range of motion,Difficulty walking,Decreased endurance,Pain,Decreased activity tolerance,Impaired flexibility,Decreased balance,Decreased mobility,Decreased strength  Visit Diagnosis: Pain in right hip  Chronic bilateral low back pain, unspecified whether sciatica present  Muscle weakness (generalized)  Unsteadiness on feet     Problem List Patient Active Problem List   Diagnosis Date Noted  . Hypokalemia 01/12/2021  . Encounter for antineoplastic chemotherapy 11/18/2020  . Systemic lupus erythematosus ([place] 11/03/2020  . Lupus nephritis ([place] 11/03/2020  . Proteinuria 11/03/2020  . Other spondylosis with radiculopathy, lumbar region 12/09/2019  . Secondary adrenal insufficiency ([place] 04/03/2019  . Metabolic acidosis, normal anion gap (NAG) 04/02/2019  . HTN (hypertension)   . Adrenal insufficiency ([place] 04/01/2019  . Hyponatremia 04/01/2019  . Hypothyroidism 04/01/2019  .  AKI (acute kidney injury) ([place] 04/01/2019  . Lumbar stenosis with neurogenic claudication 02/19/2017  . Migraine with aura and without status migrainosus, not intractable 07/11/2016  . Spondylolisthesis of lumbar region 11/04/2014  . Postconcussional syndrome 09/30/2013  . Post-concussion headache 09/30/2013  . Esophageal reflux 10/11/2011  . Benign neoplasm of colon 10/11/2011  . Gastritis, chronic 10/11/2011  . Guaiac + stool 09/28/2011  . IBS (irritable bowel syndrome) 09/28/2011  . GERD (gastroesophageal reflux disease) 09/28/2011  . Migraine syndrome 09/28/2011   Jessica C. Hightower PT, DPT 05/03/21 4:00 PM   [place] [address]Phone: [phone]  Fax:  [phone] Name: Crystal PENNYMRN: [id number] of Birth: 109-05-1956

## 2021-05-05 ENCOUNTER — Encounter (HOSPITAL_BASED_OUTPATIENT_CLINIC_OR_DEPARTMENT_OTHER): Payer: Self-pay | Admitting: Physical Therapy

## 2021-05-05 ENCOUNTER — Other Ambulatory Visit: Payer: Self-pay

## 2021-05-05 ENCOUNTER — Ambulatory Visit (HOSPITAL_BASED_OUTPATIENT_CLINIC_OR_DEPARTMENT_OTHER): Payer: Medicare Other | Admitting: Physical Therapy

## 2021-05-05 DIAGNOSIS — M25551 Pain in right hip: Secondary | ICD-10-CM | POA: Diagnosis not present

## 2021-05-05 DIAGNOSIS — R2681 Unsteadiness on feet: Secondary | ICD-10-CM

## 2021-05-05 DIAGNOSIS — M6281 Muscle weakness (generalized): Secondary | ICD-10-CM

## 2021-05-05 DIAGNOSIS — M545 Low back pain, unspecified: Secondary | ICD-10-CM

## 2021-05-05 NOTE — Therapy (Signed)
[facility] [address] [place], [place], [address] Phone: [phone]   Fax:  [phone]  Physical Therapy Treatment  Patient Details  Name: Crystal Larsen MRN: [id number] Date of Birth: 10/02/1956 Referring Provider (PT): Rod Can, MD   Encounter Date: 05/05/2021   PT End of Session - 05/05/21 1233     Visit Number 32    Number of Visits 40    Date for PT Re-Evaluation 07/04/21    Authorization Type UHC Medicare    Progress Note Due on Visit 69    PT Start Time 1232    PT Stop Time 1315    PT Time Calculation (min) 43 min    Activity Tolerance Patient tolerated treatment well    Behavior During Therapy [facility] for tasks assessed/performed             Past Medical History:  Diagnosis Date   Acute kidney injury ([place])    Adrenal insufficiency ([place])    Anemia    Anxiety    Arthritis    "left foot; left hand; right shoulder; back" (11/04/2014)   Elevated liver enzymes    Gallstones    GERD (gastroesophageal reflux disease)    History of gout    HTN (hypertension)    Hypothyroidism    IBS (irritable bowel syndrome)    ?   Lupus ([place]) 16/9678   Metabolic acidosis    Migraine    "maybe once/yr" (11/04/2014)   Neuropathy    right leg   Obesity    Pancreatitis    Pancreatitis    PONV (postoperative nausea and vomiting) 1981 X 1   only time   Seizures ([place]) 2003    due to tramadol reaction -- takes depakote for migraine prevention   Sleep apnea    does not use cpap   Spondylolisthesis of lumbar region    Unsteady gait    Wears glasses     Past Surgical History:  Procedure Laterality Date   ANKLE GANGLION CYST EXCISION Right 1980's   ANTERIOR LAT LUMBAR FUSION N/A 12/09/2019   Procedure: Lumbar One-Two Anterolateral lumbar decompression/interbody fusion with lateral plate;  Surgeon: Kristeen Miss, MD;  Location: [place];  Service: Neurosurgery;  Laterality: N/A;  Lumbar One-Two Anterolateral lumbar  decompression/interbody fusion with lateral plate   BACK SURGERY     BREAST SURGERY     biopsy   BUNIONECTOMY Left 2012   CARPAL TUNNEL RELEASE Right 05/22/2019   Procedure: RIGHT CARPAL TUNNEL RELEASE;  Surgeon: Daryll Brod, MD;  Location: [place];  Service: Orthopedics;  Laterality: Right;   CARPOMETACARPEL SUSPENSION PLASTY Right 05/22/2019   Procedure: ARTHROPLASY CARPOMETACARPAL RIGHT THUMB TRAPEZIUM EXCISION TENDON TRANSFER MICRO LINK SUSPENSION;  Surgeon: Daryll Brod, MD;  Location: [place];  Service: Orthopedics;  Laterality: Right;   CESAREAN SECTION  1986   COLONOSCOPY W/ BIOPSIES AND POLYPECTOMY     EYE SURGERY Bilateral 2012   "lasered a hole in my iris cause pressure was building up"   LAPAROSCOPIC CHOLECYSTECTOMY  1990's   NASAL SINUS SURGERY  1980's   POSTERIOR LUMBAR FUSION  05/2013   L4-5; S1   POSTERIOR LUMBAR FUSION  11/04/2014   L3-4   SHOULDER ARTHROSCOPY W/ ROTATOR CUFF REPAIR Right 2012   TUBAL LIGATION  1992    There were no vitals filed for this visit.   Subjective Assessment - 05/05/21 1234     Subjective Was sore after last time, back was fatigued  but not really hurting. pelvic tilts feel really good.    Patient Stated Goals improve pain, will be new grandparents in April and hopes to be involved    Currently in Pain? No/denies                               [facility] Adult PT Treatment/Exercise - 05/05/21 0001       Knee/Hip Exercises: Standing   Gait Training widen step width, toll over toe      Knee/Hip Exercises: Supine   Other Supine Knee/Hip Exercises core progressions: ab set, march, TT, heel taps, hundreds, dead bug                      PT Short Term Goals - 02/08/21 1241       PT SHORT TERM GOAL #1   Title Pt will be ind with initial HEP    Time 3    Period Weeks    Status Achieved    Target Date 01/19/21      PT SHORT TERM GOAL #2   Title Pt will demo improved gait  pattern to include improved weight shifting into Rt LE and reduce degree of Trendelenburg bil secondary to improved gluteal use in closed chain.    Time 4    Period Weeks    Status Partially Met    Target Date 01/26/21      PT SHORT TERM GOAL #3   Title Pt will report reduced pain with household ambulation by at least 30%    Time 4    Period Weeks    Status Achieved    Target Date 01/26/21      PT SHORT TERM GOAL #4   Title Pt will be able to sit in upright chair (vs recliner) for at least 15 mintues for mealtime with at least 30% less pain.    Baseline Was achieved but in the last week or so she has felt it again    Status On-going      PT SHORT TERM GOAL #5   Title Pt will demo 5x sit to stand without UE use in </= 13 sec    Baseline 15 sec, use of UEs; 9 sec no UEs 01/27/21    Time 4    Period Weeks    Status Achieved    Target Date 01/26/21               PT Long Term Goals - 04/26/21 1306       PT LONG TERM GOAL #1   Title Pt will report improved endurance for light ADLs and household ambulation for up to 20 min before needing seated break.    Baseline was able this weekend around 1.5 hr    Status Achieved      PT LONG TERM GOAL #2   Title Patient able to safely amb community distances using cane.    Baseline unable to complete [employer] distances, using motorized scooter; able to use cane for trip to just the pharmacy    Status On-going      PT LONG TERM GOAL #3   Title Improved Bil hip strength to >= 4+/5 to improve overall function    Baseline see flowsheet    Status On-going      PT LONG TERM GOAL #4   Title Improved 5x sit to stand to </= 11 sec to reduce fall risk  Baseline 11s    Status Achieved      PT LONG TERM GOAL #5   Title Improved FOTO to >/= 50% to demo improved function    Status Deferred      Additional Long Term Goals   Additional Long Term Goals Yes      PT LONG TERM GOAL #6   Title ambulate normally without cane    Baseline 6MWT  goal of 1765 without [facility]    Status New    Target Date 07/04/21                   Plan - 05/05/21 1408     Clinical Impression Statement Added core strength-focused exercises in supine to HEP to improve overall stability. Utilized cones in parallel bars to reduce Lt LE circumduction and narrow step width in gait.    PT Treatment/Interventions ADLs/Self Care Home Management;Cryotherapy;Electrical Stimulation;Moist Heat;Iontophoresis 1m/ml Dexamethasone;Gait training;Stair training;Functional mobility training;Therapeutic exercise;Therapeutic activities;Neuromuscular re-education;Balance training;Patient/family education;Energy conservation;Passive range of motion;Manual techniques;Taping;Spinal Manipulations;Joint Manipulations    PT Next Visit Plan cont gait without AD, check core exercise form    PT Home Exercise Plan Access Code: [email or web address]   Consulted and Agree with Plan of Care Patient             Patient will benefit from skilled therapeutic intervention in order to improve the following deficits and impairments:  Abnormal gait, Decreased range of motion, Difficulty walking, Decreased endurance, Pain, Decreased activity tolerance, Impaired flexibility, Decreased balance, Decreased mobility, Decreased strength  Visit Diagnosis: Pain in right hip  Chronic bilateral low back pain, unspecified whether sciatica present  Muscle weakness (generalized)  Unsteadiness on feet     Problem List Patient Active Problem List   Diagnosis Date Noted   Hypokalemia 01/12/2021   Encounter for antineoplastic chemotherapy 11/18/2020   Systemic lupus erythematosus ([place] 11/03/2020   Lupus nephritis ([place] 11/03/2020   Proteinuria 11/03/2020   Other spondylosis with radiculopathy, lumbar region 12/09/2019   Secondary adrenal insufficiency ([place] 012/75/1700  Metabolic acidosis, normal anion gap (NAG) 04/02/2019   HTN (hypertension)    Adrenal insufficiency ([place] 04/01/2019    Hyponatremia 04/01/2019   Hypothyroidism 04/01/2019   AKI (acute kidney injury) ([place] 04/01/2019   Lumbar stenosis with neurogenic claudication 02/19/2017   Migraine with aura and without status migrainosus, not intractable 07/11/2016   Spondylolisthesis of lumbar region 11/04/2014   Postconcussional syndrome 09/30/2013   Post-concussion headache 09/30/2013   Esophageal reflux 10/11/2011   Benign neoplasm of colon 10/11/2011   Gastritis, chronic 10/11/2011   Guaiac + stool 09/28/2011   IBS (irritable bowel syndrome) 09/28/2011   GERD (gastroesophageal reflux disease) 09/28/2011   Migraine syndrome 09/28/2011   Crystal Larsen C. Zohar Maroney PT, DPT 05/05/21 2:10 PM   [facility] [address] [place] [address]Phone: [phone]  Fax:  [phone] Name: KZIZA HASTINGSMRN: [id number] of Birth: 1Jun 27, 1957

## 2021-05-12 ENCOUNTER — Ambulatory Visit (HOSPITAL_BASED_OUTPATIENT_CLINIC_OR_DEPARTMENT_OTHER): Payer: Medicare Other | Admitting: Physical Therapy

## 2021-05-12 ENCOUNTER — Encounter (HOSPITAL_BASED_OUTPATIENT_CLINIC_OR_DEPARTMENT_OTHER): Payer: Self-pay | Admitting: Physical Therapy

## 2021-05-12 ENCOUNTER — Other Ambulatory Visit: Payer: Self-pay

## 2021-05-12 DIAGNOSIS — R2681 Unsteadiness on feet: Secondary | ICD-10-CM

## 2021-05-12 DIAGNOSIS — M25551 Pain in right hip: Secondary | ICD-10-CM | POA: Diagnosis not present

## 2021-05-12 DIAGNOSIS — M6281 Muscle weakness (generalized): Secondary | ICD-10-CM

## 2021-05-12 DIAGNOSIS — G8929 Other chronic pain: Secondary | ICD-10-CM

## 2021-05-12 NOTE — Therapy (Signed)
[facility] [address] [place], [place], [address] Phone: [phone]   Fax:  [phone]  Physical Therapy Treatment/Discharge  Patient Details  Name: Crystal Larsen MRN: [id number] Date of Birth: 29-Dec-1955 Referring Provider (PT): Rod Can, MD   Encounter Date: 05/12/2021   PT End of Session - 05/12/21 1355     Visit Number 33    Number of Visits 40    Date for PT Re-Evaluation 07/04/21    Authorization Type UHC Medicare    Progress Note Due on Visit 10    PT Start Time 1320    PT Stop Time 1345    PT Time Calculation (min) 25 min    Behavior During Therapy Anxious             Past Medical History:  Diagnosis Date   Acute kidney injury ([place])    Adrenal insufficiency ([place])    Anemia    Anxiety    Arthritis    "left foot; left hand; right shoulder; back" (11/04/2014)   Elevated liver enzymes    Gallstones    GERD (gastroesophageal reflux disease)    History of gout    HTN (hypertension)    Hypothyroidism    IBS (irritable bowel syndrome)    ?   Lupus ([place]) 58/8325   Metabolic acidosis    Migraine    "maybe once/yr" (11/04/2014)   Neuropathy    right leg   Obesity    Pancreatitis    Pancreatitis    PONV (postoperative nausea and vomiting) 1981 X 1   only time   Seizures ([place]) 2003    due to tramadol reaction -- takes depakote for migraine prevention   Sleep apnea    does not use cpap   Spondylolisthesis of lumbar region    Unsteady gait    Wears glasses     Past Surgical History:  Procedure Laterality Date   ANKLE GANGLION CYST EXCISION Right 1980's   ANTERIOR LAT LUMBAR FUSION N/A 12/09/2019   Procedure: Lumbar One-Two Anterolateral lumbar decompression/interbody fusion with lateral plate;  Surgeon: Kristeen Miss, MD;  Location: [facility];  Service: Neurosurgery;  Laterality: N/A;  Lumbar One-Two Anterolateral lumbar decompression/interbody fusion with lateral plate   BACK SURGERY      BREAST SURGERY     biopsy   BUNIONECTOMY Left 2012   CARPAL TUNNEL RELEASE Right 05/22/2019   Procedure: RIGHT CARPAL TUNNEL RELEASE;  Surgeon: Daryll Brod, MD;  Location: [facility];  Service: Orthopedics;  Laterality: Right;   CARPOMETACARPEL SUSPENSION PLASTY Right 05/22/2019   Procedure: ARTHROPLASY CARPOMETACARPAL RIGHT THUMB TRAPEZIUM EXCISION TENDON TRANSFER MICRO LINK SUSPENSION;  Surgeon: Daryll Brod, MD;  Location: [facility];  Service: Orthopedics;  Laterality: Right;   CESAREAN SECTION  1986   COLONOSCOPY W/ BIOPSIES AND POLYPECTOMY     EYE SURGERY Bilateral 2012   "lasered a hole in my iris cause pressure was building up"   LAPAROSCOPIC CHOLECYSTECTOMY  1990's   NASAL SINUS SURGERY  1980's   POSTERIOR LUMBAR FUSION  05/2013   L4-5; S1   POSTERIOR LUMBAR FUSION  11/04/2014   L3-4   SHOULDER ARTHROSCOPY W/ ROTATOR CUFF REPAIR Right 2012   TUBAL LIGATION  1992    There were no vitals filed for this visit.   Subjective Assessment - 05/12/21 1349     Subjective This will be my last day.    Patient Stated Goals improve pain, will be new grandparents in April  and hopes to be involved    Currently in Pain? No/denies                                         PT Short Term Goals - 02/08/21 1241       PT SHORT TERM GOAL #1   Title Pt will be ind with initial HEP    Time 3    Period Weeks    Status Achieved    Target Date 01/19/21      PT SHORT TERM GOAL #2   Title Pt will demo improved gait pattern to include improved weight shifting into Rt LE and reduce degree of Trendelenburg bil secondary to improved gluteal use in closed chain.    Time 4    Period Weeks    Status Partially Met    Target Date 01/26/21      PT SHORT TERM GOAL #3   Title Pt will report reduced pain with household ambulation by at least 30%    Time 4    Period Weeks    Status Achieved    Target Date 01/26/21      PT SHORT TERM GOAL #4    Title Pt will be able to sit in upright chair (vs recliner) for at least 15 mintues for mealtime with at least 30% less pain.    Baseline Was achieved but in the last week or so she has felt it again    Status On-going      PT SHORT TERM GOAL #5   Title Pt will demo 5x sit to stand without UE use in </= 13 sec    Baseline 15 sec, use of UEs; 9 sec no UEs 01/27/21    Time 4    Period Weeks    Status Achieved    Target Date 01/26/21               PT Long Term Goals - 04/26/21 1306       PT LONG TERM GOAL #1   Title Pt will report improved endurance for light ADLs and household ambulation for up to 20 min before needing seated break.    Baseline was able this weekend around 1.5 hr    Status Achieved      PT LONG TERM GOAL #2   Title Patient able to safely amb community distances using cane.    Baseline unable to complete [employer] distances, using motorized scooter; able to use cane for trip to just the pharmacy    Status On-going      PT LONG TERM GOAL #3   Title Improved Bil hip strength to >= 4+/5 to improve overall function    Baseline see flowsheet    Status On-going      PT LONG TERM GOAL #4   Title Improved 5x sit to stand to </= 11 sec to reduce fall risk    Baseline 11s    Status Achieved      PT LONG TERM GOAL #5   Title Improved FOTO to >/= 50% to demo improved function    Status Deferred      Additional Long Term Goals   Additional Long Term Goals Yes      PT LONG TERM GOAL #6   Title ambulate normally without cane    Baseline 6MWT goal of 1765 without SPC    Status New  Target Date 07/04/21                   Plan - 05/12/21 1334     Clinical Impression Statement Pt requests to be d/c today to independent program- she has a lot going on and was going to have to cancel more appointments than she was going to be able to attend. She is feeling tired and overwhelmed at this point and recognizes she will have to be motivated to do these exercises  independently. Created an A-day and B-day to decrease number of exercises she is doing at one time and balancing full body work. Encouraged her to contact me with any further questions or needs.    PT Treatment/Interventions ADLs/Self Care Home Management;Cryotherapy;Electrical Stimulation;Moist Heat;Iontophoresis 4mg /ml Dexamethasone;Gait training;Stair training;Functional mobility training;Therapeutic exercise;Therapeutic activities;Neuromuscular re-education;Balance training;Patient/family education;Energy conservation;Passive range of motion;Manual techniques;Taping;Spinal Manipulations;Joint Manipulations    PT Home Exercise Plan Access Code: [email or web address]   [email or web address]    Consulted and Agree with Plan of Care Patient             Patient will benefit from skilled therapeutic intervention in order to improve the following deficits and impairments:  Abnormal gait, Decreased range of motion, Difficulty walking, Decreased endurance, Pain, Decreased activity tolerance, Impaired flexibility, Decreased balance, Decreased mobility, Decreased strength  Visit Diagnosis: Pain in right hip  Chronic bilateral low back pain, unspecified whether sciatica present  Unsteadiness on feet  Muscle weakness (generalized)     Problem List Patient Active Problem List   Diagnosis Date Noted   Hypokalemia 01/12/2021   Encounter for antineoplastic chemotherapy 11/18/2020   Systemic lupus erythematosus ([place]) 11/03/2020   Lupus nephritis ([place]) 11/03/2020   Proteinuria 11/03/2020   Other spondylosis with radiculopathy, lumbar region 12/09/2019   Secondary adrenal insufficiency ([place]) 67/10/4579   Metabolic acidosis, normal anion gap (NAG) 04/02/2019   HTN (hypertension)    Adrenal insufficiency ([place]) 04/01/2019   Hyponatremia 04/01/2019   Hypothyroidism 04/01/2019   AKI (acute kidney injury) ([place]) 04/01/2019   Lumbar stenosis with neurogenic claudication 02/19/2017   Migraine with aura and without status  migrainosus, not intractable 07/11/2016   Spondylolisthesis of lumbar region 11/04/2014   Postconcussional syndrome 09/30/2013   Post-concussion headache 09/30/2013   Esophageal reflux 10/11/2011   Benign neoplasm of colon 10/11/2011   Gastritis, chronic 10/11/2011   Guaiac + stool 09/28/2011   IBS (irritable bowel syndrome) 09/28/2011   GERD (gastroesophageal reflux disease) 09/28/2011   Migraine syndrome 09/28/2011    PHYSICAL THERAPY DISCHARGE SUMMARY  Visits from Start of Care: 33  Current functional level related to goals / functional outcomes: See above   Remaining deficits: See above   Education / Equipment: Anatomy of condition, POC, HEP, exercise form/rationale   Patient agrees to discharge. Patient goals were partially met. Patient is being discharged due to the patient's request. Tarnisha Kachmar C. Caide Campi PT, DPT 05/12/21 1:57 PM   [facility] [place], [place], [address] Phone: [phone]   Fax:  [phone]  Name: Crystal Larsen MRN: [id number] Date of Birth: 25-Dec-1955

## 2021-05-17 ENCOUNTER — Ambulatory Visit (HOSPITAL_BASED_OUTPATIENT_CLINIC_OR_DEPARTMENT_OTHER): Payer: Medicare Other | Admitting: Physical Therapy

## 2021-05-20 ENCOUNTER — Encounter (HOSPITAL_BASED_OUTPATIENT_CLINIC_OR_DEPARTMENT_OTHER): Payer: Medicare Other | Admitting: Physical Therapy

## 2021-05-24 ENCOUNTER — Encounter (HOSPITAL_BASED_OUTPATIENT_CLINIC_OR_DEPARTMENT_OTHER): Payer: Medicare Other | Admitting: Physical Therapy

## 2021-05-26 ENCOUNTER — Encounter (HOSPITAL_BASED_OUTPATIENT_CLINIC_OR_DEPARTMENT_OTHER): Payer: Medicare Other | Admitting: Physical Therapy

## 2021-05-31 ENCOUNTER — Encounter (HOSPITAL_BASED_OUTPATIENT_CLINIC_OR_DEPARTMENT_OTHER): Payer: Medicare Other | Admitting: Physical Therapy

## 2021-06-03 ENCOUNTER — Encounter (HOSPITAL_BASED_OUTPATIENT_CLINIC_OR_DEPARTMENT_OTHER): Payer: Medicare Other | Admitting: Physical Therapy

## 2021-06-07 ENCOUNTER — Encounter (HOSPITAL_BASED_OUTPATIENT_CLINIC_OR_DEPARTMENT_OTHER): Payer: Medicare Other | Admitting: Physical Therapy

## 2021-06-09 ENCOUNTER — Encounter: Payer: Self-pay | Admitting: Adult Health

## 2021-06-09 ENCOUNTER — Encounter (HOSPITAL_BASED_OUTPATIENT_CLINIC_OR_DEPARTMENT_OTHER): Payer: Self-pay | Admitting: Physical Therapy

## 2021-06-10 ENCOUNTER — Encounter (HOSPITAL_BASED_OUTPATIENT_CLINIC_OR_DEPARTMENT_OTHER): Payer: Medicare Other | Admitting: Physical Therapy

## 2021-06-14 ENCOUNTER — Other Ambulatory Visit: Payer: Self-pay

## 2021-06-14 ENCOUNTER — Ambulatory Visit (HOSPITAL_BASED_OUTPATIENT_CLINIC_OR_DEPARTMENT_OTHER): Payer: Medicare Other | Attending: Orthopedic Surgery | Admitting: Physical Therapy

## 2021-06-14 ENCOUNTER — Encounter (HOSPITAL_BASED_OUTPATIENT_CLINIC_OR_DEPARTMENT_OTHER): Payer: Self-pay | Admitting: Physical Therapy

## 2021-06-14 ENCOUNTER — Encounter (HOSPITAL_BASED_OUTPATIENT_CLINIC_OR_DEPARTMENT_OTHER): Payer: Medicare Other | Admitting: Physical Therapy

## 2021-06-14 DIAGNOSIS — R262 Difficulty in walking, not elsewhere classified: Secondary | ICD-10-CM | POA: Diagnosis present

## 2021-06-14 DIAGNOSIS — M25551 Pain in right hip: Secondary | ICD-10-CM | POA: Diagnosis present

## 2021-06-14 DIAGNOSIS — M6281 Muscle weakness (generalized): Secondary | ICD-10-CM | POA: Insufficient documentation

## 2021-06-14 DIAGNOSIS — M545 Low back pain, unspecified: Secondary | ICD-10-CM | POA: Insufficient documentation

## 2021-06-14 DIAGNOSIS — R2681 Unsteadiness on feet: Secondary | ICD-10-CM | POA: Insufficient documentation

## 2021-06-14 DIAGNOSIS — G8929 Other chronic pain: Secondary | ICD-10-CM | POA: Insufficient documentation

## 2021-06-15 NOTE — Therapy (Signed)
[facility] [place], [place], [address] Phone: [phone]   Fax:  [phone]  Physical Therapy Treatment/Re-Evaluation  Patient Details  Name: Crystal Larsen MRN: [id number] Date of Birth: [birth year] Referring Provider (PT): Rod Can, MD   Encounter Date: 06/14/2021   PT End of Session - 06/15/21 1227     Visit Number 1    Number of Visits 5    Date for PT Re-Evaluation 07/22/21    Authorization Type UHC Medicare    PT Start Time 1015    PT Stop Time 1058    PT Time Calculation (min) 43 min    Activity Tolerance Patient tolerated treatment well    Behavior During Therapy [facility] for tasks assessed/performed;Anxious             Past Medical History:  Diagnosis Date   Acute kidney injury ([facility])    Adrenal insufficiency ([facility])    Anemia    Anxiety    Arthritis    "left foot; left hand; right shoulder; back" (11/04/2014)   Elevated liver enzymes    Gallstones    GERD (gastroesophageal reflux disease)    History of gout    HTN (hypertension)    Hypothyroidism    IBS (irritable bowel syndrome)    ?   Lupus ([place]) 23/3612   Metabolic acidosis    Migraine    "maybe once/yr" (11/04/2014)   Neuropathy    right leg   Obesity    Pancreatitis    Pancreatitis    PONV (postoperative nausea and vomiting) 1981 X 1   only time   Seizures ([place]) 2003    due to tramadol reaction -- takes depakote for migraine prevention   Sleep apnea    does not use cpap   Spondylolisthesis of lumbar region    Unsteady gait    Wears glasses     Past Surgical History:  Procedure Laterality Date   ANKLE GANGLION CYST EXCISION Right 1980's   ANTERIOR LAT LUMBAR FUSION N/A 12/09/2019   Procedure: Lumbar One-Two Anterolateral lumbar decompression/interbody fusion with lateral plate;  Surgeon: Kristeen Miss, MD;  Location: [facility];  Service: Neurosurgery;  Laterality: N/A;  Lumbar One-Two Anterolateral lumbar  decompression/interbody fusion with lateral plate   BACK SURGERY     BREAST SURGERY     biopsy   BUNIONECTOMY Left 2012   CARPAL TUNNEL RELEASE Right 05/22/2019   Procedure: RIGHT CARPAL TUNNEL RELEASE;  Surgeon: Daryll Brod, MD;  Location: [facility];  Service: Orthopedics;  Laterality: Right;   CARPOMETACARPEL SUSPENSION PLASTY Right 05/22/2019   Procedure: ARTHROPLASY CARPOMETACARPAL RIGHT THUMB TRAPEZIUM EXCISION TENDON TRANSFER MICRO LINK SUSPENSION;  Surgeon: Daryll Brod, MD;  Location: [facility];  Service: Orthopedics;  Laterality: Right;   CESAREAN SECTION  1986   COLONOSCOPY W/ BIOPSIES AND POLYPECTOMY     EYE SURGERY Bilateral 2012   "lasered a hole in my iris cause pressure was building up"   LAPAROSCOPIC CHOLECYSTECTOMY  1990's   NASAL SINUS SURGERY  1980's   POSTERIOR LUMBAR FUSION  05/2013   L4-5; S1   POSTERIOR LUMBAR FUSION  11/04/2014   L3-4   SHOULDER ARTHROSCOPY W/ ROTATOR CUFF REPAIR Right 2012   TUBAL LIGATION  1992    There were no vitals filed for this visit.   Subjective Assessment - 06/14/21 1026     Subjective Fell down the stairs- Lt leg in front, sat on rt leg that went underneath me.  Patient Stated Goals decr pain    Currently in Pain? Yes    Pain Score --   significant and limiting   Pain Location Hip    Pain Orientation Right    Pain Descriptors / Indicators Dull;Aching    Aggravating Factors  recent fall    Pain Relieving Factors laying down                [facility] PT Assessment - 06/15/21 0001       Assessment   Medical Diagnosis Rt trochanteric bursitis    Referring Provider (PT) Swinteck, Aaron Edelman, MD      Precautions   Precautions Fall      Restrictions   Weight Bearing Restrictions No      [place] residence    Additional Comments ranch style with 2 steps into house, visits daughter frequently who has 2 story home                            [facility] Adult PT Treatment/Exercise - 06/15/21 0001       Exercises   Exercises Other Exercises    Other Exercises  Hesch exercisese- see plan                    PT Education - 06/15/21 1225     Education Details anatomy of condition, POC, HEP, exercise form/rationle    Person(s) Educated Patient    Methods Explanation;Demonstration;Tactile cues;Verbal cues;Handout    Comprehension Verbalized understanding;Returned demonstration;Verbal cues required;Tactile cues required;Need further instruction              PT Short Term Goals - 02/08/21 1241       PT SHORT TERM GOAL #1   Title Pt will be ind with initial HEP    Time 3    Period Weeks    Status Achieved    Target Date 01/19/21      PT SHORT TERM GOAL #2   Title Pt will demo improved gait pattern to include improved weight shifting into Rt LE and reduce degree of Trendelenburg bil secondary to improved gluteal use in closed chain.    Time 4    Period Weeks    Status Partially Met    Target Date 01/26/21      PT SHORT TERM GOAL #3   Title Pt will report reduced pain with household ambulation by at least 30%    Time 4    Period Weeks    Status Achieved    Target Date 01/26/21      PT SHORT TERM GOAL #4   Title Pt will be able to sit in upright chair (vs recliner) for at least 15 mintues for mealtime with at least 30% less pain.    Baseline Was achieved but in the last week or so she has felt it again    Status On-going      PT SHORT TERM GOAL #5   Title Pt will demo 5x sit to stand without UE use in </= 13 sec    Baseline 15 sec, use of UEs; 9 sec no UEs 01/27/21    Time 4    Period Weeks    Status Achieved    Target Date 01/26/21               PT Long Term Goals - 06/15/21 1228       PT LONG TERM GOAL #  1   Title Decr pain <=2/10    Baseline severe at eval    Time 5    Period Weeks    Status New    Target Date 07/22/21      PT LONG TERM GOAL #2   Title Navigate stairs safely with step  over step gait    Baseline limited by pain today    Time 5    Status New      PT LONG TERM GOAL #3   Title resolution of groin pain    Baseline new since fall    Time 5    Period Weeks    Status New    Target Date 07/22/21                   Plan - 06/15/21 0748     Clinical Impression Statement Pt presents to PT 1 month after d/c for Rt hip pain- her pain has returned after falling down the stairs at her daughters house where her Rt leg was curled up underneath her. Notably poor innominate alignment with Rt pubic downslip and limited spring. Main pain complaints are at proximal attachment sites of adductor group- denied major bruising except for ankle that she sat on when she fell which reduces suspicion for adductor tearing. Denies loss of bowel or bladder or N/T/radicular symptoms. Negative grind test for femoral-acetabular involvement. Due to pain and difficulty walking, utilized creep stretches for Rt [place], Downslip and self treatment for pubic downslip. Pt cont to have pain following treatment as expected with her injury. Will f/u next week to determine necessary changes and tolerance to exercises.    Personal Factors and Comorbidities Comorbidity 3+    Comorbidities lumbar fusion L2-S1, Lupus, recent fall    Examination-Activity Limitations Locomotion Level;Transfers;Bend;Sit;Squat;Stairs;Stand;Toileting    Examination-Participation Restrictions Other    Stability/Clinical Decision Making Evolving/Moderate complexity    Clinical Decision Making Moderate    Rehab Potential Good    PT Frequency 1x / week    PT Duration 4 weeks    PT Treatment/Interventions ADLs/Self Care Home Management;Aquatic Therapy;Cryotherapy;Electrical Stimulation;Gait training;Ultrasound;Moist Heat;Iontophoresis 3m/ml Dexamethasone;Stair training;Functional mobility training;Therapeutic activities;Therapeutic exercise;Neuromuscular re-education;Manual techniques;Patient/family education;Passive  range of motion;Dry needling;Taping    PT Next Visit Plan re-evaluate    PT Home Exercise Plan Access Code: [email or web address]  [email or web address]; new, Hesch: self treatment for Rt pubic downslip, self treatment for all sacral torsions, self treatment for downslip using wall, self treatment for Rt ant [place]    Consulted and Agree with Plan of Care Patient             Patient will benefit from skilled therapeutic intervention in order to improve the following deficits and impairments:  Abnormal gait, Difficulty walking, Increased muscle spasms, Decreased activity tolerance, Pain, Improper body mechanics, Postural dysfunction  Visit Diagnosis: Pain in right hip - Plan: PT plan of care cert/re-cert  Unsteadiness on feet - Plan: PT plan of care cert/re-cert  Difficulty in walking, not elsewhere classified - Plan: PT plan of care cert/re-cert     Problem List Patient Active Problem List   Diagnosis Date Noted   Hypokalemia 01/12/2021   Encounter for antineoplastic chemotherapy 11/18/2020   Systemic lupus erythematosus ([place] 11/03/2020   Lupus nephritis ([place] 11/03/2020   Proteinuria 11/03/2020   Other spondylosis with radiculopathy, lumbar region 12/09/2019   Secondary adrenal insufficiency ([place] 072/62/0355  Metabolic acidosis, normal anion gap (NAG) 04/02/2019   HTN (hypertension)    Adrenal insufficiency ([place] 04/01/2019  Hyponatremia 04/01/2019   Hypothyroidism 04/01/2019   AKI (acute kidney injury) ([place]) 04/01/2019   Lumbar stenosis with neurogenic claudication 02/19/2017   Migraine with aura and without status migrainosus, not intractable 07/11/2016   Spondylolisthesis of lumbar region 11/04/2014   Postconcussional syndrome 09/30/2013   Post-concussion headache 09/30/2013   Esophageal reflux 10/11/2011   Benign neoplasm of colon 10/11/2011   Gastritis, chronic 10/11/2011   Guaiac + stool 09/28/2011   IBS (irritable bowel syndrome) 09/28/2011   GERD (gastroesophageal reflux disease)  09/28/2011   Migraine syndrome 09/28/2011   Shelly Spenser C. Brieanna Nau PT, DPT 06/15/21 12:33 PM   [facility] [place], [place], [address] Phone: [phone]   Fax:  [phone]  Name: Crystal Larsen MRN: [id number] Date of Birth: 11-17-1956

## 2021-06-16 ENCOUNTER — Encounter: Payer: Self-pay | Admitting: Hematology and Oncology

## 2021-06-17 ENCOUNTER — Encounter (HOSPITAL_BASED_OUTPATIENT_CLINIC_OR_DEPARTMENT_OTHER): Payer: Medicare Other | Admitting: Physical Therapy

## 2021-06-21 ENCOUNTER — Encounter (HOSPITAL_BASED_OUTPATIENT_CLINIC_OR_DEPARTMENT_OTHER): Payer: Self-pay | Admitting: Physical Therapy

## 2021-06-21 ENCOUNTER — Other Ambulatory Visit: Payer: Self-pay

## 2021-06-21 ENCOUNTER — Ambulatory Visit (HOSPITAL_BASED_OUTPATIENT_CLINIC_OR_DEPARTMENT_OTHER): Payer: Medicare Other | Admitting: Physical Therapy

## 2021-06-21 ENCOUNTER — Encounter (HOSPITAL_BASED_OUTPATIENT_CLINIC_OR_DEPARTMENT_OTHER): Payer: Medicare Other | Admitting: Physical Therapy

## 2021-06-21 DIAGNOSIS — M25551 Pain in right hip: Secondary | ICD-10-CM | POA: Diagnosis not present

## 2021-06-21 DIAGNOSIS — M6281 Muscle weakness (generalized): Secondary | ICD-10-CM

## 2021-06-21 DIAGNOSIS — M545 Low back pain, unspecified: Secondary | ICD-10-CM

## 2021-06-21 DIAGNOSIS — R262 Difficulty in walking, not elsewhere classified: Secondary | ICD-10-CM

## 2021-06-21 DIAGNOSIS — G8929 Other chronic pain: Secondary | ICD-10-CM

## 2021-06-21 DIAGNOSIS — R2681 Unsteadiness on feet: Secondary | ICD-10-CM

## 2021-06-21 NOTE — Therapy (Signed)
[facility] [address] [place], [place], [address] Phone: [phone]   Fax:  [phone]  Physical Therapy Treatment  Patient Details  Name: Crystal Larsen MRN: [id number] Date of Birth: 09/05/1956 Referring Provider (PT): Rod Can, MD   Encounter Date: 06/21/2021   PT End of Session - 06/21/21 1517     Visit Number 2    Number of Visits 5    Date for PT Re-Evaluation 07/22/21    Authorization Type UHC Medicare    PT Start Time 9622    PT Stop Time 1601    PT Time Calculation (min) 46 min    Activity Tolerance Patient tolerated treatment well    Behavior During Therapy [facility] for tasks assessed/performed             Past Medical History:  Diagnosis Date   Acute kidney injury ([place])    Adrenal insufficiency ([place])    Anemia    Anxiety    Arthritis    "left foot; left hand; right shoulder; back" (11/04/2014)   Elevated liver enzymes    Gallstones    GERD (gastroesophageal reflux disease)    History of gout    HTN (hypertension)    Hypothyroidism    IBS (irritable bowel syndrome)    ?   Lupus ([place]) 29/7989   Metabolic acidosis    Migraine    "maybe once/yr" (11/04/2014)   Neuropathy    right leg   Obesity    Pancreatitis    Pancreatitis    PONV (postoperative nausea and vomiting) 1981 X 1   only time   Seizures ([place]) 2003    due to tramadol reaction -- takes depakote for migraine prevention   Sleep apnea    does not use cpap   Spondylolisthesis of lumbar region    Unsteady gait    Wears glasses     Past Surgical History:  Procedure Laterality Date   ANKLE GANGLION CYST EXCISION Right 1980's   ANTERIOR LAT LUMBAR FUSION N/A 12/09/2019   Procedure: Lumbar One-Two Anterolateral lumbar decompression/interbody fusion with lateral plate;  Surgeon: Kristeen Miss, MD;  Location: [facility];  Service: Neurosurgery;  Laterality: N/A;  Lumbar One-Two Anterolateral lumbar decompression/interbody fusion with  lateral plate   BACK SURGERY     BREAST SURGERY     biopsy   BUNIONECTOMY Left 2012   CARPAL TUNNEL RELEASE Right 05/22/2019   Procedure: RIGHT CARPAL TUNNEL RELEASE;  Surgeon: Daryll Brod, MD;  Location: [facility];  Service: Orthopedics;  Laterality: Right;   CARPOMETACARPEL SUSPENSION PLASTY Right 05/22/2019   Procedure: ARTHROPLASY CARPOMETACARPAL RIGHT THUMB TRAPEZIUM EXCISION TENDON TRANSFER MICRO LINK SUSPENSION;  Surgeon: Daryll Brod, MD;  Location: [facility];  Service: Orthopedics;  Laterality: Right;   CESAREAN SECTION  1986   COLONOSCOPY W/ BIOPSIES AND POLYPECTOMY     EYE SURGERY Bilateral 2012   "lasered a hole in my iris cause pressure was building up"   LAPAROSCOPIC CHOLECYSTECTOMY  1990's   NASAL SINUS SURGERY  1980's   POSTERIOR LUMBAR FUSION  05/2013   L4-5; S1   POSTERIOR LUMBAR FUSION  11/04/2014   L3-4   SHOULDER ARTHROSCOPY W/ ROTATOR CUFF REPAIR Right 2012   TUBAL LIGATION  1992    There were no vitals filed for this visit.   Subjective Assessment - 06/21/21 1517     Subjective In a little more pain right now but I have been up and down working on the  house. This weekend it was killing me by sunday and laying dong no longer released it. Had to wipe down walls and vaccuum carpets.    Patient Stated Goals decr pain    Currently in Pain? Yes    Pain Location Leg    Pain Descriptors / Indicators Aching   nothing between nerve and bone   Aggravating Factors  overdoing it    Pain Relieving Factors usually laying down but sunday it did not.                               [facility] Adult PT Treatment/Exercise - 06/21/21 0001       Knee/Hip Exercises: Standing   Other Standing Knee Exercises iso press into physioball +ab set      Knee/Hip Exercises: Supine   Other Supine Knee/Hip Exercises ab set; also + UE flexion; +UE scissors      Manual Therapy   Soft tissue mobilization Rt lumabr paraspinals with pain at L2  and L4                    PT Education - 06/21/21 1653     Education Details radicular patterns, history with lumbar surgery, aquatics, exercise form/rationale    Person(s) Educated Patient    Methods Explanation;Handout    Comprehension Verbalized understanding;Need further instruction              PT Short Term Goals - 02/08/21 1241       PT SHORT TERM GOAL #1   Title Pt will be ind with initial HEP    Time 3    Period Weeks    Status Achieved    Target Date 01/19/21      PT SHORT TERM GOAL #2   Title Pt will demo improved gait pattern to include improved weight shifting into Rt LE and reduce degree of Trendelenburg bil secondary to improved gluteal use in closed chain.    Time 4    Period Weeks    Status Partially Met    Target Date 01/26/21      PT SHORT TERM GOAL #3   Title Pt will report reduced pain with household ambulation by at least 30%    Time 4    Period Weeks    Status Achieved    Target Date 01/26/21      PT SHORT TERM GOAL #4   Title Pt will be able to sit in upright chair (vs recliner) for at least 15 mintues for mealtime with at least 30% less pain.    Baseline Was achieved but in the last week or so she has felt it again    Status On-going      PT SHORT TERM GOAL #5   Title Pt will demo 5x sit to stand without UE use in </= 13 sec    Baseline 15 sec, use of UEs; 9 sec no UEs 01/27/21    Time 4    Period Weeks    Status Achieved    Target Date 01/26/21               PT Long Term Goals - 06/15/21 1228       PT LONG TERM GOAL #1   Title Decr pain <=2/10    Baseline severe at eval    Time 5    Period Weeks    Status New    Target Date 07/22/21  PT LONG TERM GOAL #2   Title Navigate stairs safely with step over step gait    Baseline limited by pain today    Time 5    Status New      PT LONG TERM GOAL #3   Title resolution of groin pain    Baseline new since fall    Time 5    Period Weeks    Status New     Target Date 07/22/21                   Plan - 06/21/21 1649     Clinical Impression Statement Beginning to complain of more symptoms into Rt LE as she had prior to her first back surgery. She has been doing a lot of overheadmotions and bending as they redo the ceilings her in home. Did have trigger points in Rt paraspinals with referred pain into Rt buttock- decreased with STM but requires further addressing. I think she would benefit from aquatic and she agreed to try it next week even though she is hesitant. Asked that she schedule a f/u with her spine surgeon to discuss her symtpoms especially with recent fall to consider.    PT Treatment/Interventions ADLs/Self Care Home Management;Aquatic Therapy;Cryotherapy;Electrical Stimulation;Gait training;Ultrasound;Moist Heat;Iontophoresis 4mg /ml Dexamethasone;Stair training;Functional mobility training;Therapeutic activities;Therapeutic exercise;Neuromuscular re-education;Manual techniques;Patient/family education;Passive range of motion;Dry needling;Taping    PT Next Visit Plan aquatics, DN to lumbar paraspinals?    PT Home Exercise Plan Access Code: [email or web address]   [email or web address]; new, Hesch: self treatment for Rt pubic downslip, self treatment for all sacral torsions, self treatment for downslip using wall, self treatment for Rt ant ilium    Consulted and Agree with Plan of Care Patient             Patient will benefit from skilled therapeutic intervention in order to improve the following deficits and impairments:  Abnormal gait, Difficulty walking, Increased muscle spasms, Decreased activity tolerance, Pain, Improper body mechanics, Postural dysfunction  Visit Diagnosis: Pain in right hip  Unsteadiness on feet  Difficulty in walking, not elsewhere classified  Chronic bilateral low back pain, unspecified whether sciatica present  Muscle weakness (generalized)     Problem List Patient Active Problem List   Diagnosis Date Noted    Hypokalemia 01/12/2021   Encounter for antineoplastic chemotherapy 11/18/2020   Systemic lupus erythematosus ([place]) 11/03/2020   Lupus nephritis ([place]) 11/03/2020   Proteinuria 11/03/2020   Other spondylosis with radiculopathy, lumbar region 12/09/2019   Secondary adrenal insufficiency ([place]) 41/28/7867   Metabolic acidosis, normal anion gap (NAG) 04/02/2019   HTN (hypertension)    Adrenal insufficiency ([place]) 04/01/2019   Hyponatremia 04/01/2019   Hypothyroidism 04/01/2019   AKI (acute kidney injury) ([place]) 04/01/2019   Lumbar stenosis with neurogenic claudication 02/19/2017   Migraine with aura and without status migrainosus, not intractable 07/11/2016   Spondylolisthesis of lumbar region 11/04/2014   Postconcussional syndrome 09/30/2013   Post-concussion headache 09/30/2013   Esophageal reflux 10/11/2011   Benign neoplasm of colon 10/11/2011   Gastritis, chronic 10/11/2011   Guaiac + stool 09/28/2011   IBS (irritable bowel syndrome) 09/28/2011   GERD (gastroesophageal reflux disease) 09/28/2011   Migraine syndrome 09/28/2011    Jessie Cowher C. Brendy Ficek PT, DPT 06/21/21 4:54 PM   [facility] [place], [place], [address] Phone: [phone]   Fax:  [phone]  Name: Crystal Larsen MRN: [id number] Date of Birth: August 08, 1956

## 2021-06-24 ENCOUNTER — Encounter (HOSPITAL_BASED_OUTPATIENT_CLINIC_OR_DEPARTMENT_OTHER): Payer: Medicare Other | Admitting: Physical Therapy

## 2021-06-28 ENCOUNTER — Encounter (HOSPITAL_BASED_OUTPATIENT_CLINIC_OR_DEPARTMENT_OTHER): Payer: Medicare Other | Admitting: Physical Therapy

## 2021-06-30 ENCOUNTER — Other Ambulatory Visit: Payer: Self-pay

## 2021-06-30 ENCOUNTER — Encounter (HOSPITAL_BASED_OUTPATIENT_CLINIC_OR_DEPARTMENT_OTHER): Payer: Self-pay | Admitting: Physical Therapy

## 2021-06-30 ENCOUNTER — Ambulatory Visit (HOSPITAL_BASED_OUTPATIENT_CLINIC_OR_DEPARTMENT_OTHER): Payer: Medicare Other | Attending: Orthopedic Surgery | Admitting: Physical Therapy

## 2021-06-30 DIAGNOSIS — M25551 Pain in right hip: Secondary | ICD-10-CM | POA: Diagnosis present

## 2021-06-30 DIAGNOSIS — R262 Difficulty in walking, not elsewhere classified: Secondary | ICD-10-CM | POA: Diagnosis present

## 2021-06-30 DIAGNOSIS — R2681 Unsteadiness on feet: Secondary | ICD-10-CM | POA: Diagnosis present

## 2021-06-30 NOTE — Therapy (Signed)
[facility] [address] [place], [place], [address] Phone: [phone]   Fax:  [phone]  Physical Therapy Treatment  Patient Details  Name: Crystal Larsen MRN: [id number] Date of Birth: 09-23-1956 Referring Provider (PT): Rod Can, MD   Encounter Date: 06/30/2021   PT End of Session - 06/30/21 1315     Visit Number 3    Number of Visits 5    Date for PT Re-Evaluation 07/22/21    Authorization Type UHC Medicare    Progress Note Due on Visit 78    PT Start Time 1315    PT Stop Time 1400    PT Time Calculation (min) 45 min    Activity Tolerance Patient tolerated treatment well    Behavior During Therapy [facility] for tasks assessed/performed             Past Medical History:  Diagnosis Date   Acute kidney injury ([place])    Adrenal insufficiency ([place])    Anemia    Anxiety    Arthritis    "left foot; left hand; right shoulder; back" (11/04/2014)   Elevated liver enzymes    Gallstones    GERD (gastroesophageal reflux disease)    History of gout    HTN (hypertension)    Hypothyroidism    IBS (irritable bowel syndrome)    ?   Lupus ([place]) 88/4166   Metabolic acidosis    Migraine    "maybe once/yr" (11/04/2014)   Neuropathy    right leg   Obesity    Pancreatitis    Pancreatitis    PONV (postoperative nausea and vomiting) 1981 X 1   only time   Seizures ([place]) 2003    due to tramadol reaction -- takes depakote for migraine prevention   Sleep apnea    does not use cpap   Spondylolisthesis of lumbar region    Unsteady gait    Wears glasses     Past Surgical History:  Procedure Laterality Date   ANKLE GANGLION CYST EXCISION Right 1980's   ANTERIOR LAT LUMBAR FUSION N/A 12/09/2019   Procedure: Lumbar One-Two Anterolateral lumbar decompression/interbody fusion with lateral plate;  Surgeon: Kristeen Miss, MD;  Location: [facility];  Service: Neurosurgery;  Laterality: N/A;  Lumbar One-Two Anterolateral lumbar  decompression/interbody fusion with lateral plate   BACK SURGERY     BREAST SURGERY     biopsy   BUNIONECTOMY Left 2012   CARPAL TUNNEL RELEASE Right 05/22/2019   Procedure: RIGHT CARPAL TUNNEL RELEASE;  Surgeon: Daryll Brod, MD;  Location: [facility];  Service: Orthopedics;  Laterality: Right;   CARPOMETACARPEL SUSPENSION PLASTY Right 05/22/2019   Procedure: ARTHROPLASY CARPOMETACARPAL RIGHT THUMB TRAPEZIUM EXCISION TENDON TRANSFER MICRO LINK SUSPENSION;  Surgeon: Daryll Brod, MD;  Location: [facility];  Service: Orthopedics;  Laterality: Right;   CESAREAN SECTION  1986   COLONOSCOPY W/ BIOPSIES AND POLYPECTOMY     EYE SURGERY Bilateral 2012   "lasered a hole in my iris cause pressure was building up"   LAPAROSCOPIC CHOLECYSTECTOMY  1990's   NASAL SINUS SURGERY  1980's   POSTERIOR LUMBAR FUSION  05/2013   L4-5; S1   POSTERIOR LUMBAR FUSION  11/04/2014   L3-4   SHOULDER ARTHROSCOPY W/ ROTATOR CUFF REPAIR Right 2012   TUBAL LIGATION  1992    There were no vitals filed for this visit.   Subjective Assessment - 06/30/21 1938     Subjective Feels like I can move well but still  have pain. was working on walls again today.                  AquaticREHABdocumentation: Water will allow for reduced gait deviation due to reduced joint loading through buoyancy to help patient improve posture without excess stress and pain. , Water will aid with movement using the current and laminar flow while the buoyancy reduces weight bearing, Hydrostatic pressure also supports joints by unweighting joint load by at least 50 % in 3-4 feet depth water. 80% in chest to neck deep water., and Water will allow for reduced gait deviation due to reduced joint loading through buoyancy to help patient improve posture without excess stress and pain  Water walking all directions Hip flexion/ext Squats Golfer lift-reach fwd with noodle Hip ER/IR Straddle noodle with bike motion UE  alt flex/ext, abd/add                        PT Short Term Goals - 02/08/21 1241       PT SHORT TERM GOAL #1   Title Pt will be ind with initial HEP    Time 3    Period Weeks    Status Achieved    Target Date 01/19/21      PT SHORT TERM GOAL #2   Title Pt will demo improved gait pattern to include improved weight shifting into Rt LE and reduce degree of Trendelenburg bil secondary to improved gluteal use in closed chain.    Time 4    Period Weeks    Status Partially Met    Target Date 01/26/21      PT SHORT TERM GOAL #3   Title Pt will report reduced pain with household ambulation by at least 30%    Time 4    Period Weeks    Status Achieved    Target Date 01/26/21      PT SHORT TERM GOAL #4   Title Pt will be able to sit in upright chair (vs recliner) for at least 15 mintues for mealtime with at least 30% less pain.    Baseline Was achieved but in the last week or so she has felt it again    Status On-going      PT SHORT TERM GOAL #5   Title Pt will demo 5x sit to stand without UE use in </= 13 sec    Baseline 15 sec, use of UEs; 9 sec no UEs 01/27/21    Time 4    Period Weeks    Status Achieved    Target Date 01/26/21               PT Long Term Goals - 06/15/21 1228       PT LONG TERM GOAL #1   Title Decr pain <=2/10    Baseline severe at eval    Time 5    Period Weeks    Status New    Target Date 07/22/21      PT LONG TERM GOAL #2   Title Navigate stairs safely with step over step gait    Baseline limited by pain today    Time 5    Status New      PT LONG TERM GOAL #3   Title resolution of groin pain    Baseline new since fall    Time 5    Period Weeks    Status New    Target Date 07/22/21  Plan - 06/30/21 1353     Clinical Impression Statement Utilized aquatic therapy for exercise and mobility today. Able to challenge balance and CKC strength with water unloading joints so she was able to  maintain a level pelvis. She continues to have groin pain near pubic symphysis in weight bearing and wider steps laterally. We discussed that POC will not change much regardless of outcome of xray but I would suggest more aquatic exercises for unloading if there is a fracture. Aquatic HEP printout provided today for her to do in her pool at home.    PT Treatment/Interventions ADLs/Self Care Home Management;Aquatic Therapy;Cryotherapy;Electrical Stimulation;Gait training;Ultrasound;Moist Heat;Iontophoresis 4mg /ml Dexamethasone;Stair training;Functional mobility training;Therapeutic activities;Therapeutic exercise;Neuromuscular re-education;Manual techniques;Patient/family education;Passive range of motion;Dry needling;Taping    PT Next Visit Plan outcome of aquatic exercises?    PT Home Exercise Plan Access Code: [email or web address]   [email or web address]; new, Hesch: self treatment for Rt pubic downslip, self treatment for all sacral torsions, self treatment for downslip using wall, self treatment for Rt ant ilium; Aquatic [email or web address]    Consulted and Agree with Plan of Care Patient             Patient will benefit from skilled therapeutic intervention in order to improve the following deficits and impairments:  Abnormal gait, Difficulty walking, Increased muscle spasms, Decreased activity tolerance, Pain, Improper body mechanics, Postural dysfunction  Visit Diagnosis: Pain in right hip  Unsteadiness on feet  Difficulty in walking, not elsewhere classified     Problem List Patient Active Problem List   Diagnosis Date Noted   Hypokalemia 01/12/2021   Encounter for antineoplastic chemotherapy 11/18/2020   Systemic lupus erythematosus ([place]) 11/03/2020   Lupus nephritis ([place]) 11/03/2020   Proteinuria 11/03/2020   Other spondylosis with radiculopathy, lumbar region 12/09/2019   Secondary adrenal insufficiency ([place]) 39/76/7341   Metabolic acidosis, normal anion gap (NAG) 04/02/2019   HTN (hypertension)     Adrenal insufficiency ([place]) 04/01/2019   Hyponatremia 04/01/2019   Hypothyroidism 04/01/2019   AKI (acute kidney injury) ([place]) 04/01/2019   Lumbar stenosis with neurogenic claudication 02/19/2017   Migraine with aura and without status migrainosus, not intractable 07/11/2016   Spondylolisthesis of lumbar region 11/04/2014   Postconcussional syndrome 09/30/2013   Post-concussion headache 09/30/2013   Esophageal reflux 10/11/2011   Benign neoplasm of colon 10/11/2011   Gastritis, chronic 10/11/2011   Guaiac + stool 09/28/2011   IBS (irritable bowel syndrome) 09/28/2011   GERD (gastroesophageal reflux disease) 09/28/2011   Migraine syndrome 09/28/2011   Tyiana Hill C. Julieanne Hadsall PT, DPT 06/30/21 7:47 PM   [facility] [address] [place], [place], [address] Phone: [phone]   Fax:  [phone]  Name: HIBO BLASDELL MRN: [id number] Date of Birth: 09-Apr-1956

## 2021-07-01 ENCOUNTER — Encounter (HOSPITAL_BASED_OUTPATIENT_CLINIC_OR_DEPARTMENT_OTHER): Payer: Medicare Other | Admitting: Physical Therapy

## 2021-07-08 ENCOUNTER — Other Ambulatory Visit: Payer: Self-pay | Admitting: Adult Health

## 2021-07-09 ENCOUNTER — Encounter: Payer: Self-pay | Admitting: Neurology

## 2021-07-11 ENCOUNTER — Other Ambulatory Visit: Payer: Self-pay | Admitting: Neurology

## 2021-07-11 MED ORDER — DESVENLAFAXINE SUCCINATE ER 50 MG PO TB24: 50.0000 mg | ORAL_TABLET | Freq: Every day | ORAL | 1 refills | Status: DC

## 2021-07-13 ENCOUNTER — Other Ambulatory Visit (HOSPITAL_BASED_OUTPATIENT_CLINIC_OR_DEPARTMENT_OTHER): Payer: Self-pay | Admitting: Family Medicine

## 2021-07-13 ENCOUNTER — Other Ambulatory Visit: Payer: Self-pay

## 2021-07-13 ENCOUNTER — Ambulatory Visit (HOSPITAL_BASED_OUTPATIENT_CLINIC_OR_DEPARTMENT_OTHER)
Admission: RE | Admit: 2021-07-13 | Discharge: 2021-07-13 | Disposition: A | Discharge status: Home / Self Care | Payer: Medicare Other | Source: Ambulatory Visit | Attending: Family Medicine | Admitting: Family Medicine

## 2021-07-13 ENCOUNTER — Encounter (HOSPITAL_BASED_OUTPATIENT_CLINIC_OR_DEPARTMENT_OTHER): Payer: Self-pay

## 2021-07-13 DIAGNOSIS — M898X8 Other specified disorders of bone, other site: Secondary | ICD-10-CM | POA: Diagnosis present

## 2021-07-22 ENCOUNTER — Other Ambulatory Visit: Payer: Self-pay | Admitting: Neurology

## 2021-07-22 DIAGNOSIS — G43909 Migraine, unspecified, not intractable, without status migrainosus: Secondary | ICD-10-CM

## 2021-09-07 ENCOUNTER — Ambulatory Visit: Payer: 59 | Admitting: Adult Health

## 2021-09-14 ENCOUNTER — Encounter: Payer: Self-pay | Admitting: Neurology

## 2021-09-14 ENCOUNTER — Ambulatory Visit: Payer: Medicare Other | Admitting: Neurology

## 2021-09-14 ENCOUNTER — Other Ambulatory Visit: Payer: Self-pay

## 2021-09-14 VITALS — BP 120/84 | HR 79 | Ht 66.5 in | Wt 168.0 lb

## 2021-09-14 DIAGNOSIS — G63 Polyneuropathy in diseases classified elsewhere: Secondary | ICD-10-CM

## 2021-09-14 DIAGNOSIS — E039 Hypothyroidism, unspecified: Secondary | ICD-10-CM | POA: Insufficient documentation

## 2021-09-14 DIAGNOSIS — Z9221 Personal history of antineoplastic chemotherapy: Secondary | ICD-10-CM

## 2021-09-14 DIAGNOSIS — M3219 Other organ or system involvement in systemic lupus erythematosus: Secondary | ICD-10-CM

## 2021-09-14 DIAGNOSIS — M3214 Glomerular disease in systemic lupus erythematosus: Secondary | ICD-10-CM

## 2021-09-14 DIAGNOSIS — M21372 Foot drop, left foot: Secondary | ICD-10-CM | POA: Diagnosis not present

## 2021-09-14 DIAGNOSIS — E2749 Other adrenocortical insufficiency: Secondary | ICD-10-CM

## 2021-09-14 DIAGNOSIS — M4316 Spondylolisthesis, lumbar region: Secondary | ICD-10-CM

## 2021-09-14 MED ORDER — LIDOCAINE-PRILOCAINE 2.5-2.5 % EX CREA: 1.0000 "application " | TOPICAL_CREAM | CUTANEOUS | 2 refills | Status: AC | PRN

## 2021-09-14 NOTE — Patient Instructions (Signed)
Neuropathic Pain Neuropathic pain is pain caused by damage to the nerves that are responsible for certain sensations in your body (sensory nerves). The pain can be caused by: Damage to the sensory nerves that send signals to your spinal cord and brain (peripheral nervous system). Damage to the sensory nerves in your brain or spinal cord (central nervous system). Neuropathic pain can make you more sensitive to pain. Even a minor sensation can feel very painful. This is usually a long-term condition that can be difficult to treat. The type of pain differs from person to person. It may: Start suddenly (acute), or it may develop slowly and last for a long time (chronic). Come and go as damaged nerves heal, or it may stay at the same level for years. Cause emotional distress, loss of sleep, and a lower quality of life. What are the causes? The most common cause of this condition is diabetes. Many other diseases and conditions can also cause neuropathic pain. Causes of neuropathic pain can be classified as: Toxic. This is caused by medicines and chemicals. The most common cause of toxic neuropathic pain is damage from cancer treatments (chemotherapy). Metabolic. This can be caused by: Diabetes. This is the most common disease that damages the nerves. Lack of vitamin B from long-term alcohol abuse. Traumatic. Any injury that cuts, crushes, or stretches a nerve can cause damage and pain. A common example is feeling pain after losing an arm or leg (phantom limb pain). Compression-related. If a sensory nerve gets trapped or compressed for a long period of time, the blood supply to the nerve can be cut off. Vascular. Many blood vessel diseases can cause neuropathic pain by decreasing blood supply and oxygen to nerves. Autoimmune. This type of pain results from diseases in which the body's defense system (immune system) mistakenly attacks sensory nerves. Examples of autoimmune diseases that can cause  neuropathic pain include lupus and multiple sclerosis. Infectious. Many types of viral infections can damage sensory nerves and cause pain. Shingles infection is a common cause of this type of pain. Inherited. Neuropathic pain can be a symptom of many diseases that are passed down through families (genetic). What increases the risk? You are more likely to develop this condition if: You have diabetes. You smoke. You drink too much alcohol. You are taking certain medicines, including medicines that kill cancer cells (chemotherapy) or that treat immune system disorders. What are the signs or symptoms? The main symptom is pain. Neuropathic pain is often described as: Burning. Shock-like. Stinging. Hot or cold. Itching. How is this diagnosed? No single test can diagnose neuropathic pain. It is diagnosed based on: Physical exam and your symptoms. Your health care provider will ask you about your pain. You may be asked to use a pain scale to describe how bad your pain is. Tests. These may be done to see if you have a high sensitivity to pain and to help find the cause and location of any sensory nerve damage. They include: Nerve conduction studies to test how well nerve signals travel through your sensory nerves (electrodiagnostic testing). Stimulating your sensory nerves through electrodes on your skin and measuring the response in your spinal cord and brain (somatosensory evoked potential). Imaging studies, such as: X-rays. CT scan. MRI. How is this treated? Treatment for neuropathic pain may change over time. You may need to try different treatment options or a combination of treatments. Some options include: Treating the underlying cause of the neuropathy, such as diabetes, kidney disease, or vitamin   deficiencies. Stopping medicines that can cause neuropathy, such as chemotherapy. Medicine to relieve pain. Medicines may include: Prescription or over-the-counter pain  medicine. Anti-seizure medicine. Antidepressant medicines. Pain-relieving patches that are applied to painful areas of skin. A medicine to numb the area (local anesthetic), which can be injected as a nerve block. Transcutaneous nerve stimulation. This uses electrical currents to block painful nerve signals. The treatment is painless. Alternative treatments, such as: Acupuncture. Meditation. Massage. Physical therapy. Pain management programs. Counseling. Follow these instructions at home: Medicines  Take over-the-counter and prescription medicines only as told by your health care provider. Do not drive or use heavy machinery while taking prescription pain medicine. If you are taking prescription pain medicine, take actions to prevent or treat constipation. Your health care provider may recommend that you: Drink enough fluid to keep your urine pale yellow. Eat foods that are high in fiber, such as fresh fruits and vegetables, whole grains, and beans. Limit foods that are high in fat and processed sugars, such as fried or sweet foods. Take an over-the-counter or prescription medicine for constipation. Lifestyle  Have a good support system at home. Consider joining a chronic pain support group. Do not use any products that contain nicotine or tobacco, such as cigarettes and e-cigarettes. If you need help quitting, ask your health care provider. Do not drink alcohol. General instructions Learn as much as you can about your condition. Work closely with all your health care providers to find the treatment plan that works best for you. Ask your health care provider what activities are safe for you. Keep all follow-up visits as told by your health care provider. This is important. Contact a health care provider if: Your pain treatments are not working. You are having side effects from your medicines. You are struggling with tiredness (fatigue), mood changes, depression, or  anxiety. Summary Neuropathic pain is pain caused by damage to the nerves that are responsible for certain sensations in your body (sensory nerves). Neuropathic pain may come and go as damaged nerves heal, or it may stay at the same level for years. Neuropathic pain is usually a long-term condition that can be difficult to treat. Consider joining a chronic pain support group. This information is not intended to replace advice given to you by your health care provider. Make sure you discuss any questions you have with your health care provider. Document Revised: 03/06/2019 Document Reviewed: 11/30/2017 [employer] Patient Education  2022 [place]. Peripheral Neuropathy Peripheral neuropathy is a type of nerve damage. It affects nerves that carry signals between the spinal cord and the arms, legs, and the rest of the body (peripheral nerves). It does not affect nerves in the spinal cord or brain. In peripheral neuropathy, one nerve or a group of nerves may be damaged. Peripheral neuropathy is a broad category that includes many specific nerve disorders, like diabetic neuropathy, hereditary neuropathy, and carpal tunnel syndrome. What are the causes? This condition may be caused by: Diabetes. This is the most common cause of peripheral neuropathy. Nerve injury. Pressure or stress on a nerve that lasts a long time. Lack (deficiency) of B vitamins. This can result from alcoholism, poor diet, or a restricted diet. Infections. Autoimmune diseases, such as rheumatoid arthritis and systemic lupus erythematosus. Nerve diseases that are passed from parent to child (inherited). Some medicines, such as cancer medicines (chemotherapy). Poisonous (toxic) substances, such as lead and mercury. Too little blood flowing to the legs. Kidney disease. Thyroid disease. In some cases, the cause of  this condition is not known. What are the signs or symptoms? Symptoms of this condition depend on which of your  nerves is damaged. Common symptoms include: Loss of feeling (numbness) in the feet, hands, or both. Tingling in the feet, hands, or both. Burning pain. Very sensitive skin. Weakness. Not being able to move a part of the body (paralysis). Muscle twitching. Clumsiness or poor coordination. Loss of balance. Not being able to control your bladder. Feeling dizzy. Sexual problems. How is this diagnosed? Diagnosing and finding the cause of peripheral neuropathy can be difficult. Your health care provider will take your medical history and do a physical exam. A neurological exam will also be done. This involves checking things that are affected by your brain, spinal cord, and nerves (nervous system). For example, your health care provider will check your reflexes, how you move, and what you can feel. You may have other tests, such as: Blood tests. Electromyogram (EMG) and nerve conduction tests. These tests check nerve function and how well the nerves are controlling the muscles. Imaging tests, such as CT scans or MRI to rule out other causes of your symptoms. Removing a small piece of nerve to be examined in a lab (nerve biopsy). Removing and examining a small amount of the fluid that surrounds the brain and spinal cord (lumbar puncture). How is this treated? Treatment for this condition may involve: Treating the underlying cause of the neuropathy, such as diabetes, kidney disease, or vitamin deficiencies. Stopping medicines that can cause neuropathy, such as chemotherapy. Medicine to help relieve pain. Medicines may include: Prescription or over-the-counter pain medicine. Antiseizure medicine. Antidepressants. Pain-relieving patches that are applied to painful areas of skin. Surgery to relieve pressure on a nerve or to destroy a nerve that is causing pain. Physical therapy to help improve movement and balance. Devices to help you move around (assistive devices). Follow these instructions  at home: Medicines Take over-the-counter and prescription medicines only as told by your health care provider. Do not take any other medicines without first asking your health care provider. Do not drive or use heavy machinery while taking prescription pain medicine. Lifestyle  Do not use any products that contain nicotine or tobacco, such as cigarettes and e-cigarettes. Smoking keeps blood from reaching damaged nerves. If you need help quitting, ask your health care provider. Avoid or limit alcohol. Too much alcohol can cause a vitamin B deficiency, and vitamin B is needed for healthy nerves. Eat a healthy diet. This includes: Eating foods that are high in fiber, such as fresh fruits and vegetables, whole grains, and beans. Limiting foods that are high in fat and processed sugars, such as fried or sweet foods. General instructions  If you have diabetes, work closely with your health care provider to keep your blood sugar under control. If you have numbness in your feet: Check every day for signs of injury or infection. Watch for redness, warmth, and swelling. Wear padded socks and comfortable shoes. These help protect your feet. Develop a good support system. Living with peripheral neuropathy can be stressful. Consider talking with a mental health specialist or joining a support group. Use assistive devices and attend physical therapy as told by your health care provider. This may include using a walker or a cane. Keep all follow-up visits as told by your health care provider. This is important. Contact a health care provider if: You have new signs or symptoms of peripheral neuropathy. You are struggling emotionally from dealing with peripheral neuropathy. Your pain  is not well-controlled. Get help right away if: You have an injury or infection that is not healing normally. You develop new weakness in an arm or leg. You have fallen or do so frequently. Summary Peripheral neuropathy is  when the nerves in the arms, or legs are damaged, resulting in numbness, weakness, or pain. There are many causes of peripheral neuropathy, including diabetes, pinched nerves, vitamin deficiencies, autoimmune disease, and hereditary conditions. Diagnosing and finding the cause of peripheral neuropathy can be difficult. Your health care provider will take your medical history, do a physical exam, and do tests, including blood tests and nerve function tests. Treatment involves treating the underlying cause of the neuropathy and taking medicines to help control pain. Physical therapy and assistive devices may also help. This information is not intended to replace advice given to you by your health care provider. Make sure you discuss any questions you have with your health care provider. Document Revised: 08/24/2020 Document Reviewed: 08/24/2020 [employer] Patient Education  2022 [employer].

## 2021-09-14 NOTE — Progress Notes (Signed)
[facility]  Provider:  Dr Paisli Silfies Referring Provider: Gavin Pound, MD Primary Care Physician:  Jonathon Jordan, MD  Chief Complaint  Patient presents with   Neurologic Problem    Pt alone, rm 10. Presents today with new concerns. Was diagnosed with Lupus in aug 2020 and that is also when she noticed the numbness/tingling in BLE. Rheumatologist advised it was neuropathy. More recently around the summer developed burning/shooting pains in BLE. They dont last long but are happening more frequently. Pt has not had NCV/EMG completed or imaging.     HPI:  Crystal Larsen is a 65 y.o. female here as a referral from Dr. Trudie Reed for a new problem of   NEUROPATHY.   09-14-2021: Presents today with new concerns. Was diagnosed with Lupus in aug 2020 and that is also when she noticed the numbness/tingling in BLE. Rheumatologist advised it was neuropathy. More recently around the summer developed burning/shooting pains . They are brief but are happening more frequently. She underwent 6 cycles of chemotherapy in the early year 2022,  The patient reports that she developed some discomfort of foot pain and decided to try another pair of shoes she had the same feeling no matter what shoes she wore and it was then that she realized that something in her feet had changed in terms of her sensory.  A room rheumatologist is following her for lupus and advised her that this may be a neuropathy.  Another thing that Crystal Larsen has observed is that she is easier to sleep for example in the bathtub and that she does not feel the surface her feet touch very well.  The shooting pains she describes seem to come from the heel to the tos, mostly on the right foot, not ascending and not affecting the back.   In terms of her migraines, she is in excellent control for several years now.    She is now retired , used to Psychologist, occupational at [employer], but due to hip pain and needing a cane , she was not able to  return after the pandemic.  She is now just followed by her rheumatologist but also by nephrology.  Her primary care physician is Alessandra Grout.    She has been treated here with valproic sodium 1 tablet daily she has taken hydrocortisone after she developed adrenal insufficiency it is essential that she remains on some dose of steroids, for hip pain oxycodone has been prescribed as needed.  She is taking progesterone, Effexor, iron supplement, Niaspan, folate acid, dicyclomine, Benlysta which is an intravenous medication for her lupus condition, losartan his potassium and hydrochlorothiazide, amlodipine, and levocetirizine for allergies.       My last notes with this patient were in regards to post concussion syndrome: 03/27/18 , and I see the patient for a RV. She is doing well.  This is good reports that she has done very well on Depakote as a headache preventative but she does have a period of 2 to 3 weeks with almost relentless headaches she woke up this time, she went to bed with son and they have meanwhile resolved she is not sure what brought them on what led to their disappearance.  Overall the patient is doing very well she is looking healthy and feeling well at this time.  I will refill her medication today I think it would be fine if we need once a year from now on.    Crystal Larsen is a 65 year old female with a  history of postconcussive syndrome. She returns today for follow-up. Her headaches had essentially resolved until about 2-3 months ago. She states that she has 2 types of headaches. She reports that she will get an aura that consists of changes with her vision. She reports that those changes including blurry vision and the inability to focus on objects. She reports that this will last 30-45 minutes but is typically never followed by a headache. She states that it occurs approximately once every 3 weeks. She does note that fluorescent lights and bright lights are triggers for these  events. She reports that she does have headaches that occur consecutively for 6-8 days a month. These are not associated with the visual changes.  These headaches are typically located in the frontal region and behind the eyes. She denies photophobia and phonophobia as well as nausea and vomiting. She states that she does feel that she has a fullness in the maxillary regions bilaterally. She is on four anti-histamines due to itching. She does not feel that the anti-histamines has made the headaches worse or better. In the past she has been on Depakote and that essentially resolved her headaches. She returns today for an evaluation.  HISTORY 07/11/16: Crystal Larsen is a 65 year old female with a history of postconcussive syndrome- The concussion occurred 24 years ago. She was originally referred by Dr. Nolon Rod in 2015. The patient was taking Depakote for migraine- after the last visit she decreased her dose by half- taking 250 mg daily and meanwhile discontinued the medication completely.  The patient reports that she has not had any migraine headaches. She continues to notice some changes in her memory. She has trouble remembering conversations but can recall it later on. Denies having to give up anything due to her perceived memory disturbance. She is able to complete all ADLs independently. She operates a Teacher, music without difficulty. Denies any new neurological symptoms. She returns today for an evaluation after having had 3-4 migrainous auras without any pain. She called the office and was told to take Depakote as a pain medication, not a preventive. I explained today that we should use IV Depakote for headaches, not for aura.  She is here for a repeat MOCA. Crystal Larsen reports that she sometimes misses parts of the conversation that her family members may recall. She especially mentioned an emergency room visit after which she was convinced that she was told she had muscle spasms but neither her husband nor  her daughter recalls her being given a diagnosis at all.     Review of Systems: Out of a complete 14 system review, the patient complains of only the following symptoms, and all other reviewed systems are negative.  Shooting foot pain, not ascending.   Shrinking of  lower extremity muscle, bilaterally.  Foot swellig more on the left.   Social History   Socioeconomic History   Marital status: Married    Spouse name: Not on file   Number of children: 2   Years of education: Not on file   Highest education level: Not on file  Occupational History   Occupation: retired  Tobacco Use   Smoking status: Never   Smokeless tobacco: Never  Vaping Use   Vaping Use: Never used  Substance and Sexual Activity   Alcohol use: Yes    Alcohol/week: 1.0 standard drink    Types: 1 Glasses of wine per week    Comment: occasional   Drug use: No   Sexual activity: Not  Currently  Other Topics Concern   Not on file  Social History Narrative   Right handed. Caffeine 3 cups coffee daily, BS, Married, 2 kids.    Social Determinants of Health   Financial Resource Strain: Not on file  Food Insecurity: Not on file  Transportation Needs: Not on file  Physical Activity: Not on file  Stress: Not on file  Social Connections: Not on file  Intimate Partner Violence: Not on file    Family History  Problem Relation Age of Onset   Lung cancer Mother    Heart disease Father    Heart disease Maternal Grandfather        PGF   Alcohol abuse Maternal Grandfather    Hyperlipidemia Maternal Grandfather    Anxiety disorder Sister    Fibromyalgia Sister    Nephrolithiasis Daughter     Past Medical History:  Diagnosis Date   Acute kidney injury ([place])    Adrenal insufficiency ([place])    Anemia    Anxiety    Arthritis    "left foot; left hand; right shoulder; back" (11/04/2014)   Elevated liver enzymes    Gallstones    GERD (gastroesophageal reflux disease)    History of gout    HTN (hypertension)     Hypothyroidism    IBS (irritable bowel syndrome)    ?   Lupus ([place]) 65/6812   Metabolic acidosis    Migraine    "maybe once/yr" (11/04/2014)   Neuropathy    right leg   Obesity    Pancreatitis    Pancreatitis    PONV (postoperative nausea and vomiting) 1981 X 1   only time   Seizures ([place]) 2003    due to tramadol reaction -- takes depakote for migraine prevention   Sleep apnea    does not use cpap   Spondylolisthesis of lumbar region    Unsteady gait    Wears glasses     Past Surgical History:  Procedure Laterality Date   ANKLE GANGLION CYST EXCISION Right 1980's   ANTERIOR LAT LUMBAR FUSION N/A 12/09/2019   Procedure: Lumbar One-Two Anterolateral lumbar decompression/interbody fusion with lateral plate;  Surgeon: Kristeen Miss, MD;  Location: [place];  Service: Neurosurgery;  Laterality: N/A;  Lumbar One-Two Anterolateral lumbar decompression/interbody fusion with lateral plate   BACK SURGERY     BREAST SURGERY     biopsy   BUNIONECTOMY Left 2012   CARPAL TUNNEL RELEASE Right 05/22/2019   Procedure: RIGHT CARPAL TUNNEL RELEASE;  Surgeon: Daryll Brod, MD;  Location: [place];  Service: Orthopedics;  Laterality: Right;   CARPOMETACARPEL SUSPENSION PLASTY Right 05/22/2019   Procedure: ARTHROPLASY CARPOMETACARPAL RIGHT THUMB TRAPEZIUM EXCISION TENDON TRANSFER MICRO LINK SUSPENSION;  Surgeon: Daryll Brod, MD;  Location: [place];  Service: Orthopedics;  Laterality: Right;   CESAREAN SECTION  1986   COLONOSCOPY W/ BIOPSIES AND POLYPECTOMY     EYE SURGERY Bilateral 2012   "lasered a hole in my iris cause pressure was building up"   LAPAROSCOPIC CHOLECYSTECTOMY  1990's   NASAL SINUS SURGERY  1980's   POSTERIOR LUMBAR FUSION  05/2013   L4-5; S1   POSTERIOR LUMBAR FUSION  11/04/2014   L3-4   SHOULDER ARTHROSCOPY W/ ROTATOR CUFF REPAIR Right 2012   TUBAL LIGATION  1992    Current Outpatient Medications  Medication Sig Dispense Refill    acetaminophen (TYLENOL) 500 MG tablet Take 1,000 mg by mouth 3 (three) times daily.     amLODipine (NORVASC)  10 MG tablet Take 10 mg by mouth at bedtime.      aspirin EC 81 MG tablet Take 81 mg by mouth at bedtime.     Belimumab (BENLYSTA IV)      desvenlafaxine (PRISTIQ) 50 MG 24 hr tablet Take 1 tablet (50 mg total) by mouth daily. 90 tablet 1   dicyclomine (BENTYL) 20 MG tablet Take 20 mg by mouth daily as needed (abdominal pain).      diphenhydrAMINE HCl, Sleep, (UNISOM SLEEPGELS) 50 MG CAPS Take 50 mg by mouth at bedtime.     diphenhydrAMINE HCl, Sleep, 50 MG CAPS Unisom (diphenhydramine)     divalproex (DEPAKOTE ER) 250 MG 24 hr tablet TAKE 1 TABLET BY MOUTH  DAILY 90 tablet 3   hydrocortisone (CORTEF) 10 MG tablet Take 10 mg by mouth See admin instructions. Take 15 mg in the morning and 5 mg in the afternoon     hydroxychloroquine (PLAQUENIL) 200 MG tablet Take 200 mg by mouth 2 (two) times daily.     levocetirizine (XYZAL) 5 MG tablet Take 5 mg by mouth at bedtime.     levothyroxine (SYNTHROID) 200 MCG tablet Take 200 mcg by mouth daily before breakfast.     losartan-hydrochlorothiazide (HYZAAR) 100-12.5 MG tablet losartan 100 mg-hydrochlorothiazide 12.5 mg tablet     Melatonin 10 MG TBDP Take 10 mg by mouth at bedtime.     niacin (NIASPAN) 500 MG CR tablet Take 500 mg by mouth at bedtime.     Omega-3 Fatty Acids (OMEGA 3 PO) Take 1 capsule by mouth at bedtime.     Omeprazole 20 MG TBDD      oxymetazoline (AFRIN) 0.05 % nasal spray Place 1 spray into both nostrils 2 (two) times daily as needed for congestion.     Probiotic Product (PROBIOTIC PO) Take 1 capsule by mouth daily.      No current facility-administered medications for this visit.    Allergies as of 09/14/2021 - Review Complete 09/14/2021  Allergen Reaction Noted   Ultram [tramadol] Other (See Comments) 06/12/2013   Hydrocodone-acetaminophen Itching 10/26/2020   Adhesive [tape] Rash 06/20/2013   Erythromycin Rash  09/21/2008   Latex Rash 10/27/2014   Neurontin [gabapentin] Itching 09/30/2014   Other Rash 10/26/2020   Sulfonamide derivatives Rash 09/21/2008    Vitals: BP 120/84   Pulse 79   Ht 5' 6.5" (1.689 m)   Wt 168 lb (76.2 kg)   BMI 26.71 kg/m  Last Weight:  Wt Readings from Last 1 Encounters:  09/14/21 168 lb (76.2 kg)   Last Height:   Ht Readings from Last 1 Encounters:  09/14/21 5' 6.5" (1.689 m)   Vision Screening: deferred   General: The patient is awake, alert and appears not in acute distress. The patient is well groomed. Head: Normocephalic, atraumatic. Neck is supple. Cardiovascular:  Regular rate and rhythm, without  murmurs or carotid bruit, and without distended neck veins. Respiratory: Lungs are clear to auscultation. Skin:  Without evidence of edema, or rash- there is a little puffiness I both feet. Feet are warm.  Trunk: BMI is 26 elevated and patient  has normal posture.   Neurologic exam : The patient is awake and alert, oriented to place and time.  Memory subjective described as intact. There is a normal attention span & concentration ability. Speech is fluent with dysarthria. Mood and affect are appropriate.  Cranial nerves: Pupils are equal and briskly reactive to light. Extraocular movements  in vertical and horizontal planes intact  and without nystagmus. Visual fields by finger perimetry are intact. Hearing to finger rub intact.  Facial sensation intact to fine touch. Facial motor strength is symmetric and tongue and uvula move midline.  Motor exam:   Normal tone  in all extremities. There is dorsiflexion plantar weakness on the left foot over right, normal knee flexion and extension, hip pain prevents ROM fullness in the right, there is some atrophy reported by the patient affecting the area just above the heels.   Sensory:  Fine touch, pinprick and vibration were tested in both lower EXTR.  There was normal sensation to fine touch by Q-tip between the, ankle  and ankle to toe.  Plantar and dorsal aspect of the foot on both sides.  As to vibration the sensation of vibration applied to the large toe ceased far earlier than expected and was still audible to the patient.  She also reports that her feet always feel cold but they are warm to touch and she actually has a normal capillary refill. She is ticklish to q tip testing.   Coordination: Rapid alternating movements in the fingers/hands is tested and normal. Finger-to-nose maneuver tested and normal without evidence of ataxia, dysmetria or tremor.  Gait and station: Patient walks with a cane as assistive device and is able and unassisted stool climb up to the exam table. Strength in upper extremities within normal limits. Stance is assisted by a cane. Walking with a limp, right foot turning outwards.  Has fallen over the right foot, drop feet.   Tandem gait is deferred. , turns with 4 Steps, turning fragmented. Romberg testing is normal.  Deep tendon reflexes: in the upper and lower extremities are symmetrically attenuated, low -  and only achilles tendon reflex is missing. . Babinski maneuver response is down going.   Assessment:  After physical and neurologic examination, review of laboratory studies, imaging, neurophysiology testing and pre-existing records, assessment :    Imay supplement some of the blood test the patient had already had Dr. Lenna Gilford as a rheumatologist has followed her now for a while and has always done blood test with her.. 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  Her hematocrit hemoglobin total red blood cell count advised blood cell counts are normal.  These results are from June of this year.  Sed rate was 19 which is not abnormal.  Complement C3 was also low at 77 mcg/dL.  C- reactive protein was 2 that is  normal.  On Tdap will strength DNA was over 300 which is high value but this is a typical finding for lupus.  Metabolic factors show a normal liver function test creatinine was 1.89.  Electrolytes were in normal range.  For a  patient who has been adrenergic insufficient to speaks for very good supplementation.  Neuropathy, Both Lower extremities, neuropathy, LUPUS, left foot drop, right hip stiffness, feet always feeling cold.   This is a peripheral neuropathy affecting plantar pedis toes following the arch of the feet towards the heel.  There is numbness to vibration but not to fine touch.  There are some painful dysesthesias.  There is a feeling that the feet are always cold but they are actually warm to touch.  In addition there is some motor deficit, while walking her right foot is extended and rotated  to with the outside which is normally a sign of a stiffness or weakness in her case related to her hip.  Surprisingly she had evidence of foot drop and weakness of dorsiflexion on the left side.    Plan:  Treatment plan and additional workup :     I would definitely order nerve conduction studies and EMG to be performed here.  She tolerates mycophenolate well.   She advised me, she cannot take GABAPENTIN, I wrote for EMLA cream topical.    Larey Seat , MD   RV in 3 month with Np.

## 2021-09-15 ENCOUNTER — Other Ambulatory Visit: Payer: Self-pay | Admitting: Orthopedic Surgery

## 2021-09-15 DIAGNOSIS — M24341 Pathological dislocation of right hand, not elsewhere classified: Secondary | ICD-10-CM

## 2021-09-21 ENCOUNTER — Encounter: Payer: Self-pay | Admitting: Neurology

## 2021-10-04 ENCOUNTER — Other Ambulatory Visit: Payer: Self-pay

## 2021-10-04 ENCOUNTER — Ambulatory Visit
Admission: RE | Admit: 2021-10-04 | Discharge: 2021-10-04 | Disposition: A | Discharge status: Home / Self Care | Payer: Medicare Other | Source: Ambulatory Visit | Attending: Orthopedic Surgery | Admitting: Orthopedic Surgery

## 2021-10-04 DIAGNOSIS — M24341 Pathological dislocation of right hand, not elsewhere classified: Secondary | ICD-10-CM

## 2021-11-02 IMAGING — US US BIOPSY
1 series · 10 of 10 positions shown · non-contrast
Comparison: CT abdomen and pelvis-12/17/2019

INDICATION: Proteinuria of uncertain etiology. Please perform ultrasound-guided
renal biopsy for tissue diagnostic purposes.

EXAM:
ULTRASOUND GUIDED RENAL BIOPSY

[Series 1: us biopsy (kidney) · 10 of 10 slices shown]
[im 1/10]
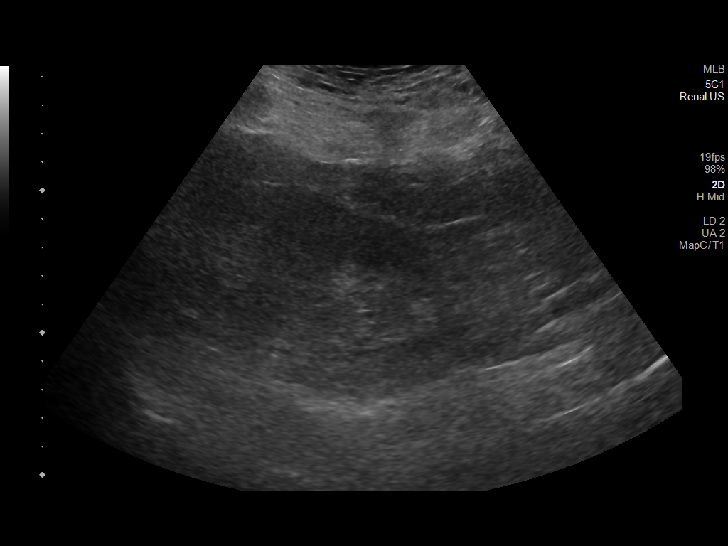
[im 2/10]
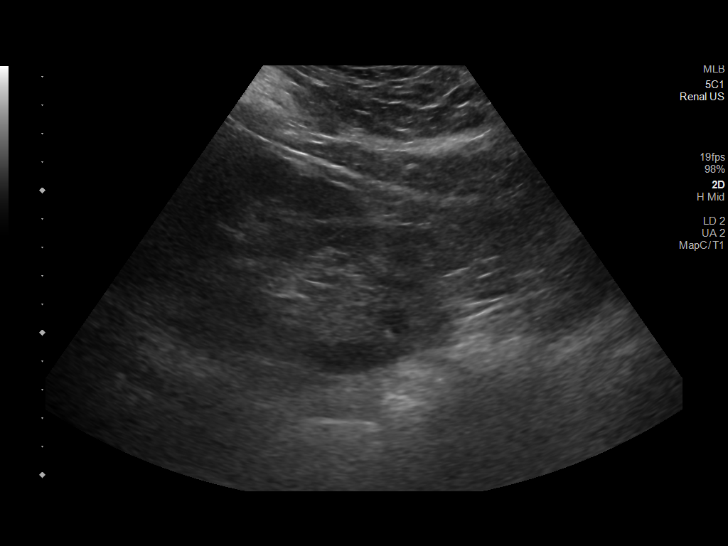
[im 3/10]
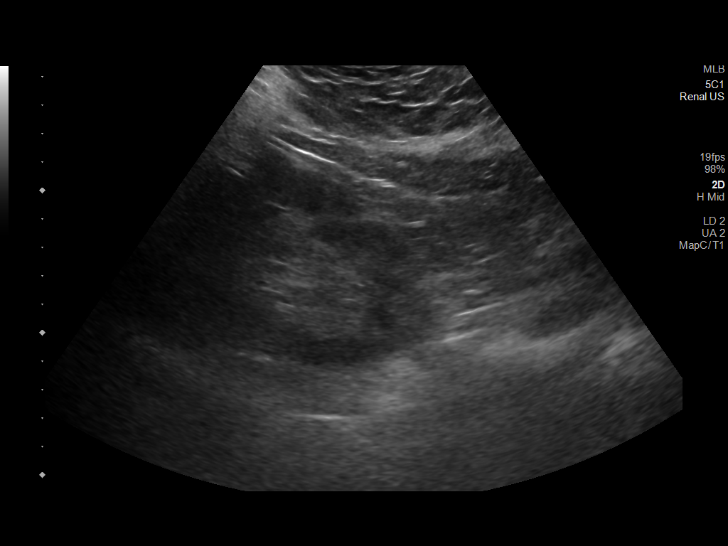
[im 4/10]
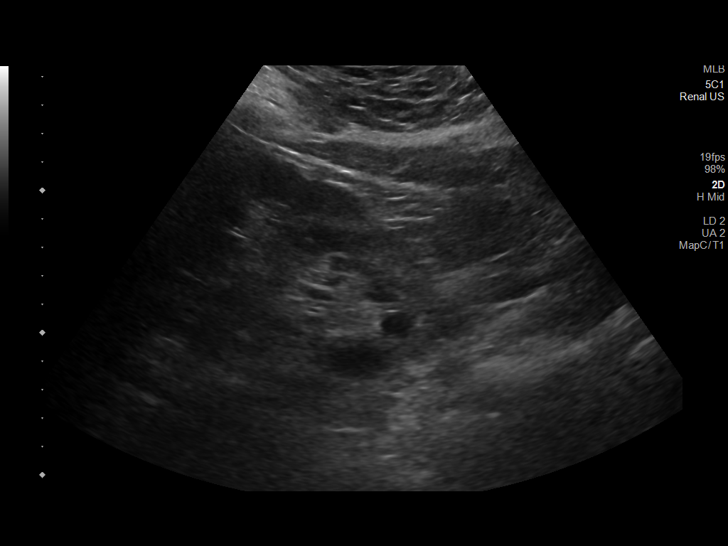
[im 5/10]
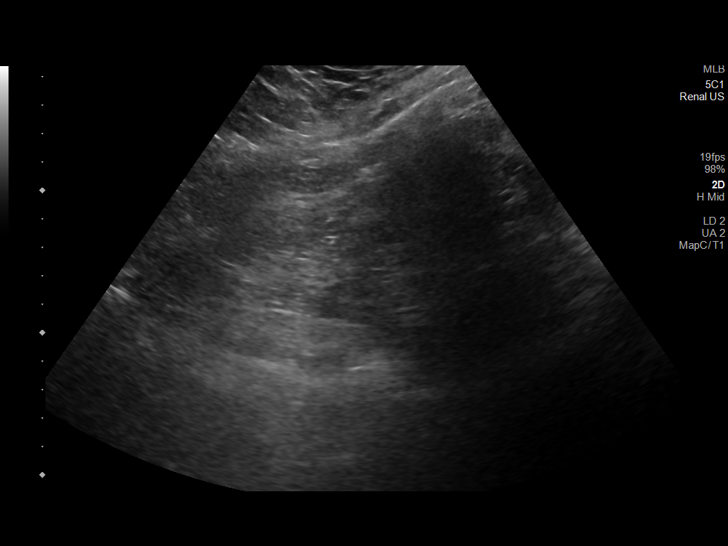
[im 6/10]
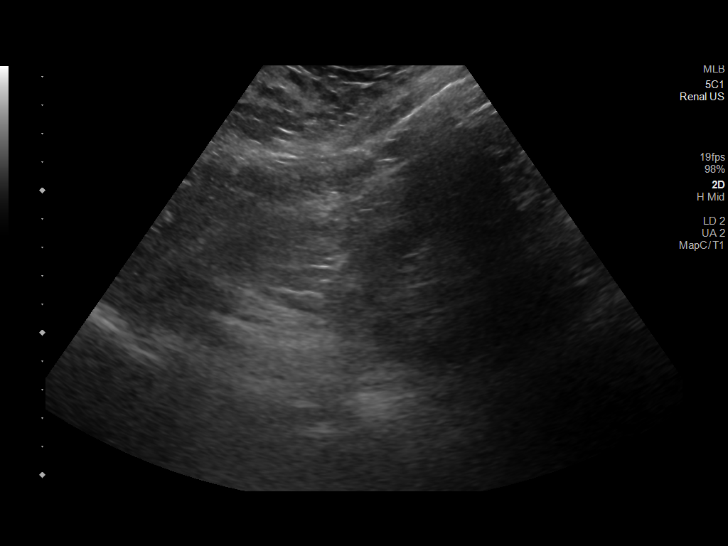
[im 7/10]
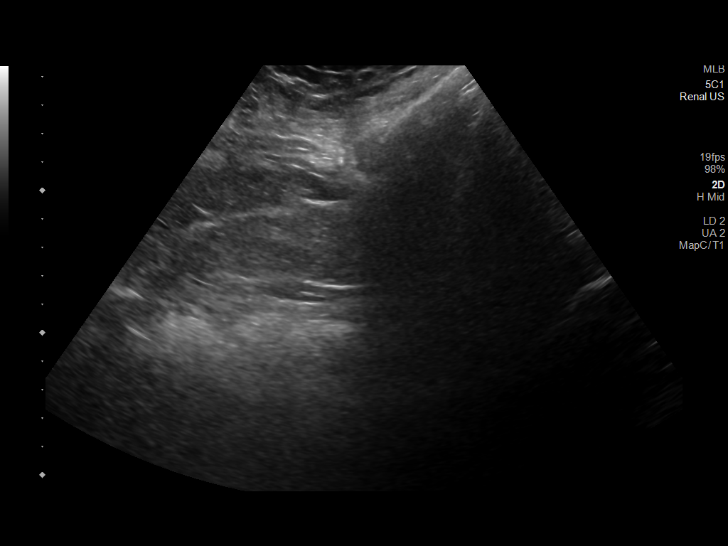
[im 8/10]
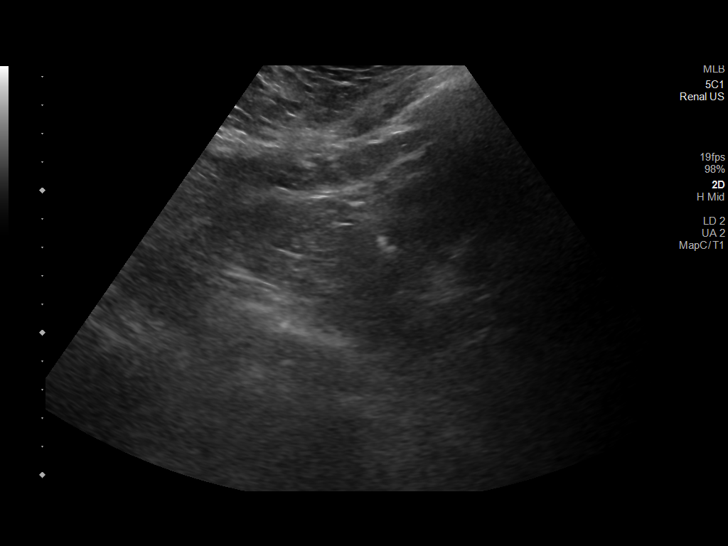
[im 9/10]
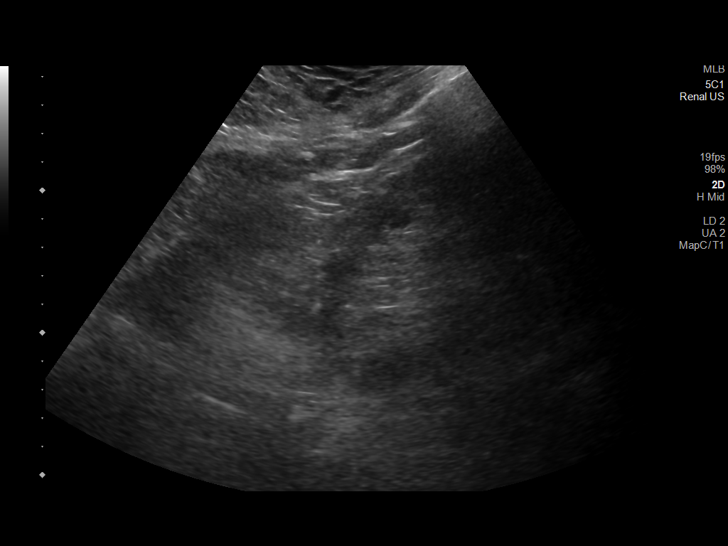
[im 10/10]
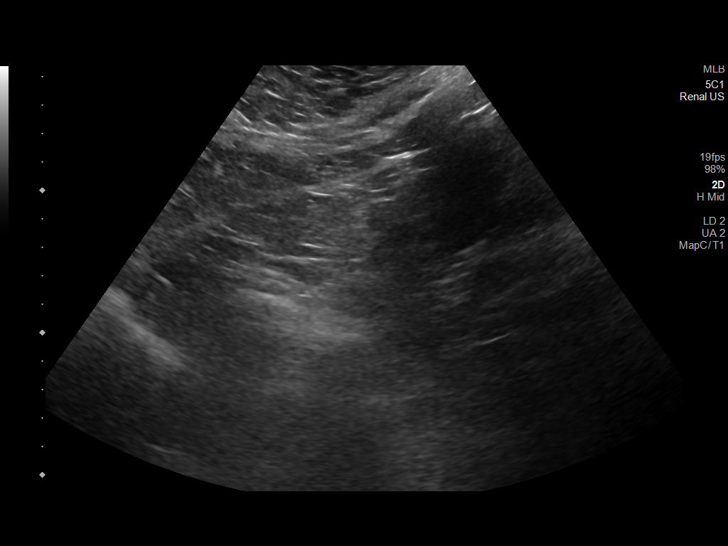

[10 of 10 positions shown; findings below may reference images not displayed]

MEDICATIONS:
None.

ANESTHESIA/SEDATION:
Fentanyl 100 mcg IV; Versed 2 mg IV

Total Moderate Sedation time: 14 minutes; The patient was
continuously monitored during the procedure by the interventional
radiology nurse under my direct supervision.

COMPLICATIONS:
None immediate.

PROCEDURE:
Informed written consent was obtained from the patient after a
discussion of the risks, benefits and alternatives to treatment. The
patient understands and consents the procedure. A timeout was
performed prior to the initiation of the procedure.

Ultrasound scanning was performed of the bilateral flanks. The
inferior pole of the right kidney was selected for biopsy due to
location and sonographic window. The procedure was planned. The
operative site was prepped and draped in the usual sterile fashion.
The overlying soft tissues were anesthetized with 1% lidocaine with
epinephrine. A 17 gauge core needle biopsy device was advanced into
the inferior cortex of the right kidney and 4 core biopsies were
obtained under direct ultrasound guidance. Images were saved for
documentation purposes. The biopsy device was removed and hemostasis
was obtained with manual compression. Post procedural scanning was
negative for significant post procedural hemorrhage or additional
complication. A dressing was placed. The patient tolerated the
procedure well without immediate post procedural complication.
IMPRESSION: Technically successful ultrasound guided right renal biopsy.

## 2021-11-03 ENCOUNTER — Ambulatory Visit (INDEPENDENT_AMBULATORY_CARE_PROVIDER_SITE_OTHER): Payer: Medicare Other | Admitting: Diagnostic Neuroimaging

## 2021-11-03 ENCOUNTER — Encounter: Payer: Medicare Other | Admitting: Diagnostic Neuroimaging

## 2021-11-03 DIAGNOSIS — E039 Hypothyroidism, unspecified: Secondary | ICD-10-CM

## 2021-11-03 DIAGNOSIS — E2749 Other adrenocortical insufficiency: Secondary | ICD-10-CM

## 2021-11-03 DIAGNOSIS — Z0289 Encounter for other administrative examinations: Secondary | ICD-10-CM

## 2021-11-03 DIAGNOSIS — Z9221 Personal history of antineoplastic chemotherapy: Secondary | ICD-10-CM

## 2021-11-03 DIAGNOSIS — M3219 Other organ or system involvement in systemic lupus erythematosus: Secondary | ICD-10-CM

## 2021-11-03 DIAGNOSIS — G63 Polyneuropathy in diseases classified elsewhere: Secondary | ICD-10-CM

## 2021-11-03 DIAGNOSIS — M3214 Glomerular disease in systemic lupus erythematosus: Secondary | ICD-10-CM

## 2021-11-03 DIAGNOSIS — M21372 Foot drop, left foot: Secondary | ICD-10-CM

## 2021-11-03 NOTE — Progress Notes (Signed)
65 year old female with lupus here for evaluation of numbness in feet. Bilateral sural and superficial peroneal sensory responses could not be obtained. Abnormal study demonstrating: - Severe axonal sensory neuropathy. No motor involvement. Recommend recommend general multi-vitamin and alpha lipoic acid

## 2021-11-03 NOTE — Procedures (Signed)
[facility]  NCS (NERVE CONDUCTION STUDY) WITH EMG (ELECTROMYOGRAPHY) REPORT   STUDY DATE: 11/03/21 PATIENT NAME: Crystal Larsen DOB: Apr 09, 1956 MRN: [id number]  ORDERING CLINICIAN: Larey Seat, MD   TECHNOLOGIST: Sherre Scarlet ELECTROMYOGRAPHER: Earlean Polka. Thailan Sava, MD  CLINICAL INFORMATION: 65 year old female with lupus here for evaluation of numbness in feet.  FINDINGS: NERVE CONDUCTION STUDY:  Bilateral peroneal and tibial motor responses are normal.  Bilateral sural and superficial peroneal sensory responses could not be obtained.  Bilateral tibial F wave latencies are normal.   NEEDLE ELECTROMYOGRAPHY:  Needle examination of right lower extremity vastus medialis, tibialis anterior, gastrocnemius is normal.   IMPRESSION:   Abnormal study demonstrating: - Severe axonal sensory neuropathy.    INTERPRETING PHYSICIAN:  Penni Bombard, MD Certified in Neurology, Neurophysiology and Neuroimaging  [facility] [address], [place], [place] [address] [phone]   [facility]    Nerve / Sites Muscle Latency Ref. Amplitude Ref. Rel Amp Segments Distance Velocity Ref. Area    ms ms mV mV %  cm m/s m/s mVms  R Peroneal - EDB     Ankle EDB 3.1 ?6.5 3.0 ?2.0 100 Ankle - EDB 9   8.9     Fib head EDB 9.8  2.6  86.1 Fib head - Ankle 29 44 ?44 7.4     Pop fossa EDB 12.1  2.3  90.7 Pop fossa - Fib head 10 44 ?44 6.9         Pop fossa - Ankle      L Peroneal - EDB     Ankle EDB 3.5 ?6.5 2.7 ?2.0 100 Ankle - EDB 9   9.9     Fib head EDB 10.2  2.5  91.6 Fib head - Ankle 29 44 ?44 8.3     Pop fossa EDB 12.5  2.6  103 Pop fossa - Fib head 10 44 ?44 8.2         Pop fossa - Ankle      R Tibial - AH     Ankle AH 3.6 ?5.8 7.7 ?4.0 100 Ankle - AH 9   14.0     Pop fossa AH 12.1  5.9  76.6 Pop fossa - Ankle 40 47 ?41 12.8  L Tibial - AH     Ankle AH 3.1 ?5.8 7.1 ?4.0 100 Ankle - AH 9   13.1     Pop fossa AH 11.9  5.8  81.5 Pop  fossa - Ankle 40 46 ?41 12.1             SNC    Nerve / Sites Rec. Site Peak Lat Ref.  Amp Ref. Segments Distance    ms ms V V  cm  R Sural - Ankle (Calf)     Calf Ankle NR ?4.4 NR ?6 Calf - Ankle 14  L Sural - Ankle (Calf)     Calf Ankle NR ?4.4 NR ?6 Calf - Ankle 14  R Superficial peroneal - Ankle     Lat leg Ankle NR ?4.4 NR ?6 Lat leg - Ankle 14  L Superficial peroneal - Ankle     Lat leg Ankle NR ?4.4 NR ?6 Lat leg - Ankle 14             F  Wave    Nerve F Lat Ref.   ms ms  R Tibial - AH 50.1 ?56.0  L Tibial - AH 51.5 ?56.0  EMG Summary Table    Spontaneous MUAP Recruitment  Muscle IA Fib PSW Fasc Other Amp Dur. Poly Pattern  R. Vastus medialis Normal None None None _______ Normal Normal Normal Normal  R. Tibialis anterior Normal None None None _______ Normal Normal Normal Normal  R. Gastrocnemius (Medial head) Normal None None None _______ Normal Normal Normal Normal

## 2021-11-07 ENCOUNTER — Encounter: Payer: Self-pay | Admitting: Neurology

## 2021-11-08 ENCOUNTER — Telehealth: Payer: Self-pay | Admitting: *Deleted

## 2021-11-08 NOTE — Telephone Encounter (Signed)
-----   Message from Larey Seat, MD sent at 11/03/2021  5:22 PM EST ----- 65 year old female with lupus here for evaluation of numbness in feet. Bilateral sural and superficial peroneal sensory responses could not be obtained. Abnormal study demonstrating: - Severe axonal sensory neuropathy. No motor involvement. Recommend recommend general multi-vitamin and alpha lipoic acid

## 2021-11-08 NOTE — Telephone Encounter (Signed)
I spoke to the patient and reviewed the results. She is agreeable to try adding a multi-vitamin and alpha lipoic acid. She will call back with any further concerns.

## 2021-11-17 ENCOUNTER — Other Ambulatory Visit: Payer: Self-pay | Admitting: Neurology

## 2021-11-29 ENCOUNTER — Encounter: Payer: Self-pay | Admitting: Neurology

## 2021-11-30 ENCOUNTER — Other Ambulatory Visit: Payer: Self-pay | Admitting: Neurology

## 2021-11-30 MED ORDER — DESVENLAFAXINE SUCCINATE ER 50 MG PO TB24: 50.0000 mg | ORAL_TABLET | Freq: Every day | ORAL | 0 refills | Status: DC

## 2021-12-17 ENCOUNTER — Encounter: Payer: Self-pay | Admitting: Neurology

## 2021-12-19 ENCOUNTER — Other Ambulatory Visit: Payer: Self-pay | Admitting: Neurology

## 2021-12-19 DIAGNOSIS — G43909 Migraine, unspecified, not intractable, without status migrainosus: Secondary | ICD-10-CM

## 2021-12-19 MED ORDER — DIVALPROEX SODIUM ER 250 MG PO TB24: 250.0000 mg | ORAL_TABLET | Freq: Every day | ORAL | 2 refills | Status: DC

## 2022-01-19 ENCOUNTER — Encounter: Payer: Self-pay | Admitting: Plastic Surgery

## 2022-01-19 ENCOUNTER — Other Ambulatory Visit: Payer: Self-pay

## 2022-01-19 ENCOUNTER — Ambulatory Visit: Payer: Medicare Other | Admitting: Plastic Surgery

## 2022-01-19 VITALS — BP 120/80 | HR 75 | Ht 66.0 in | Wt 161.2 lb

## 2022-01-19 DIAGNOSIS — L723 Sebaceous cyst: Secondary | ICD-10-CM | POA: Diagnosis not present

## 2022-01-19 NOTE — Progress Notes (Signed)
Referring Provider Jonathon Jordan, MD [facility] [address],  [place] [address]   CC:  Chief Complaint  Patient presents with   consult      Crystal Larsen is an 66 y.o. female.  HPI: Patient presents to discuss a left forehead cyst.  Is been present for a number of years.  It is growing and getting larger in size.  It intermittently drains fluid.  She is interested in having it removed.  It is intermittently painful.  Allergies  Allergen Reactions   Ultram [Tramadol] Other (See Comments)    seizures   Hydrocodone-Acetaminophen Itching   Adhesive [Tape] Rash    bandaide   Erythromycin Rash   Latex Rash   Neurontin [Gabapentin] Itching   Other Rash    adhesive   Sulfonamide Derivatives Rash    Outpatient Encounter Medications as of 01/19/2022  Medication Sig   acetaminophen (TYLENOL) 500 MG tablet Take 1,000 mg by mouth 3 (three) times daily.   Alpha-Lipoic Acid (LIPOIC ACID PO) Take by mouth.   amLODipine (NORVASC) 10 MG tablet Take 10 mg by mouth at bedtime.    aspirin EC 81 MG tablet Take 81 mg by mouth at bedtime.   desvenlafaxine (PRISTIQ) 50 MG 24 hr tablet Take 1 tablet (50 mg total) by mouth daily.   dicyclomine (BENTYL) 20 MG tablet Take 20 mg by mouth daily as needed (abdominal pain).    diphenhydrAMINE HCl, Sleep, 50 MG CAPS Unisom (diphenhydramine)   divalproex (DEPAKOTE ER) 250 MG 24 hr tablet Take 1 tablet (250 mg total) by mouth daily.   hydrocortisone (CORTEF) 10 MG tablet Take 10 mg by mouth See admin instructions. Take 15 mg in the morning and 5 mg in the afternoon   hydroxychloroquine (PLAQUENIL) 200 MG tablet Take 200 mg by mouth 2 (two) times daily.   levocetirizine (XYZAL) 5 MG tablet Take 5 mg by mouth at bedtime.   levothyroxine (SYNTHROID) 200 MCG tablet Take 200 mcg by mouth daily before breakfast.   lidocaine-prilocaine (EMLA) cream Apply 1 application topically as needed.   losartan-hydrochlorothiazide (HYZAAR) 100-12.5 MG  tablet losartan 100 mg-hydrochlorothiazide 12.5 mg tablet   Melatonin 10 MG TBDP Take 10 mg by mouth at bedtime.   Multiple Vitamin (MULTI-VITAMIN DAILY PO) Take by mouth.   niacin (NIASPAN) 500 MG CR tablet Take 500 mg by mouth at bedtime.   Omega-3 Fatty Acids (OMEGA 3 PO) Take 1 capsule by mouth at bedtime.   oxymetazoline (AFRIN) 0.05 % nasal spray Place 1 spray into both nostrils 2 (two) times daily as needed for congestion.   Probiotic Product (PROBIOTIC PO) Take 1 capsule by mouth daily.    VITAMIN D PO Take by mouth.   Belimumab (BENLYSTA IV)    diphenhydrAMINE HCl, Sleep, (UNISOM SLEEPGELS) 50 MG CAPS Take 50 mg by mouth at bedtime.   Omeprazole 20 MG TBDD    No facility-administered encounter medications on file as of 01/19/2022.     Past Medical History:  Diagnosis Date   Acute kidney injury ([facility])    Adrenal insufficiency ([facility])    Anemia    Anxiety    Arthritis    "left foot; left hand; right shoulder; back" (11/04/2014)   Elevated liver enzymes    Gallstones    GERD (gastroesophageal reflux disease)    History of gout    HTN (hypertension)    Hypothyroidism    IBS (irritable bowel syndrome)    ?   Lupus ([facility])  27/0350   Metabolic acidosis    Migraine    "maybe once/yr" (11/04/2014)   Neuropathy    right leg   Obesity    Pancreatitis    Pancreatitis    PONV (postoperative nausea and vomiting) 1981 X 1   only time   Seizures ([facility]) 2003    due to tramadol reaction -- takes depakote for migraine prevention   Sleep apnea    does not use cpap   Spondylolisthesis of lumbar region    Unsteady gait    Wears glasses     Past Surgical History:  Procedure Laterality Date   ANKLE GANGLION CYST EXCISION Right 1980's   ANTERIOR LAT LUMBAR FUSION N/A 12/09/2019   Procedure: Lumbar One-Two Anterolateral lumbar decompression/interbody fusion with lateral plate;  Surgeon: Kristeen Miss, MD;  Location: [facility];  Service: Neurosurgery;  Laterality: N/A;  Lumbar One-Two  Anterolateral lumbar decompression/interbody fusion with lateral plate   BACK SURGERY     BREAST SURGERY     biopsy   BUNIONECTOMY Left 2012   CARPAL TUNNEL RELEASE Right 05/22/2019   Procedure: RIGHT CARPAL TUNNEL RELEASE;  Surgeon: Daryll Brod, MD;  Location: [facility];  Service: Orthopedics;  Laterality: Right;   CARPOMETACARPEL SUSPENSION PLASTY Right 05/22/2019   Procedure: ARTHROPLASY CARPOMETACARPAL RIGHT THUMB TRAPEZIUM EXCISION TENDON TRANSFER MICRO LINK SUSPENSION;  Surgeon: Daryll Brod, MD;  Location: [facility];  Service: Orthopedics;  Laterality: Right;   CESAREAN SECTION  1986   COLONOSCOPY W/ BIOPSIES AND POLYPECTOMY     EYE SURGERY Bilateral 2012   "lasered a hole in my iris cause pressure was building up"   LAPAROSCOPIC CHOLECYSTECTOMY  1990's   NASAL SINUS SURGERY  1980's   POSTERIOR LUMBAR FUSION  05/2013   L4-5; S1   POSTERIOR LUMBAR FUSION  11/04/2014   L3-4   SHOULDER ARTHROSCOPY W/ ROTATOR CUFF REPAIR Right 2012   TUBAL LIGATION  1992    Family History  Problem Relation Age of Onset   Lung cancer Mother    Heart disease Father    Heart disease Maternal Grandfather        PGF   Alcohol abuse Maternal Grandfather    Hyperlipidemia Maternal Grandfather    Anxiety disorder Sister    Fibromyalgia Sister    Nephrolithiasis Daughter     Social History   Social History Narrative   Right handed. Caffeine 3 cups coffee daily, BS, Married, 2 kids.      Review of Systems General: Denies fevers, chills, weight loss CV: Denies chest pain, shortness of breath, palpitations  Physical Exam Vitals with BMI 01/19/2022 09/14/2021 03/09/2021  Height 5\' 6"  5' 6.5" -  Weight 161 lbs 3 oz 168 lbs -  BMI 09.38 18.29 -  Systolic 937 169 678  Diastolic 80 84 83  Pulse 75 79 66    General:  No acute distress,  Alert and oriented, Non-Toxic, Normal speech and affect Patient has a left forehead cyst that is about 2-1/2 cm in diameter.  There  is a central punctum.  It feels freely mobile.  No other overlying skin changes.  Assessment/Plan Patient presents with a left forehead cyst.  We discussed removal in the office.  We discussed risks include bleeding, infection, damage to surrounding structures need for additional procedures.  All of her questions were answered and she is interested in moving forward.  Cindra Presume 01/19/2022, 1:28 PM

## 2022-01-25 ENCOUNTER — Telehealth: Payer: Self-pay

## 2022-01-25 NOTE — Telephone Encounter (Signed)
Patient called to say that she was in about a week ago to see Dr. Claudia Desanctis regarding a cyst on her forehead.  She said they scheduled a time to remove it on 02/02/2022.  She said she has been thinking about it, and she still wants to have it done, but she has a wedding to go to the following week and she forgot to ask what type of bandage she will have on her head or if there will be stitches.  She said she is trying to decide if she needs to put off the procedure until after the wedding.  Please call. ?

## 2022-01-26 ENCOUNTER — Other Ambulatory Visit: Payer: Self-pay | Admitting: Neurology

## 2022-01-26 NOTE — Telephone Encounter (Signed)
Called and spoke with the patient and informed her of the message below per Dr. Claudia Desanctis.   ? ?Patient verbalized understanding and agreed.  She stated that she feels it would be better if she waits until after the wedding to have the cyst removed.   ? ?Informed the patient that she can give [place] a call back and reschedule the appointment.  Patient verbalized understanding and stated that she will give [place] a call back.//AB/CMA ?

## 2022-01-26 NOTE — Telephone Encounter (Signed)
Per Dr. Claudia Desanctis:There will be stitches and likely a bandaid, which she doesn't have to have on a week later if she doesn't want it.  There may be some bruising.  If she's worried about it then fine to reschedule.  Thanks.  ?

## 2022-02-02 ENCOUNTER — Ambulatory Visit: Payer: Medicare Other | Admitting: Plastic Surgery

## 2022-02-13 ENCOUNTER — Ambulatory Visit: Payer: Medicare Other | Admitting: Neurology

## 2022-02-13 VITALS — BP 134/82 | HR 80 | Ht 66.0 in | Wt 165.5 lb

## 2022-02-13 DIAGNOSIS — G6289 Other specified polyneuropathies: Secondary | ICD-10-CM | POA: Insufficient documentation

## 2022-02-13 DIAGNOSIS — M3214 Glomerular disease in systemic lupus erythematosus: Secondary | ICD-10-CM | POA: Diagnosis not present

## 2022-02-13 DIAGNOSIS — G620 Drug-induced polyneuropathy: Secondary | ICD-10-CM

## 2022-02-13 MED ORDER — PREGABALIN 25 MG PO CAPS: 25.0000 mg | ORAL_CAPSULE | Freq: Two times a day (BID) | ORAL | 2 refills | Status: AC

## 2022-02-13 MED ORDER — DOXYLAMINE SUCCINATE (SLEEP) 25 MG PO TABS: 25.0000 mg | ORAL_TABLET | Freq: Every evening | ORAL | 0 refills | Status: AC | PRN

## 2022-02-13 NOTE — Progress Notes (Signed)
[facility] ? ?Provider:  Dr Brett Fairy ?Referring Provider: Jonathon Jordan, MD ?Primary Care Physician:  Jonathon Jordan, MD ? ?Chief Complaint  ?Patient presents with  ? Follow-up  ?  RM 10,alone. Last seen 09/14/21. Here to discuss EMG/NCS and discuss next steps.   ? ? ?HPI:  Crystal Larsen is a 66 y.o. female here in a RV -02-13-2022:NEUROPATHY.  ? ?She was diagnosed with severe axonal demyelinating sensory neuropathy. Her feet feel swollen and the planta pedis are sensitive, feeling as if walking on pebbles.  The feet feel cool to her, but are showing normal capillary refill time. We agreed that Lupus is the most likely cause. Proteinuria. She has lupus and she could have also a  chemotherapy induced neuropathy.  ?I can only suggest gabapentin or Lyrica for pain reduction. ?She had a bone density in the last 6 months, and she had no access to the results ( [facility] ) but was told to take Vitamin D 2000 Iu a day and a multivitamin (?).  ?She has not had a new colonoscopy but she is due: She reports hip pain on the right side, but felt better after PT. Low back is again affected as well.  ?Last Dg Image with [facility], for hip and pelvis: Hardware in the lumbosacral spine. SI joints are non widened. Pubic ?symphysis and rami appear intact. No fracture or malalignment. Mild degenerative change of both hips. ? ? ? ?09-14-2021: Presents today with new concerns. Was diagnosed with Lupus in aug 2020 and that is also when she noticed the numbness/tingling in BLE. Rheumatologist advised it was neuropathy. More recently around the summer developed burning/shooting pains . They are brief but are happening more frequently. ?She underwent 6 cycles of chemotherapy in the early year 2022,  ?The patient reports that she developed some discomfort of foot pain and decided to try another pair of shoes she had the same feeling no matter what shoes she wore and it was then that she realized that something in  her feet had changed in terms of her sensory.  A room rheumatologist is following her for lupus and advised her that this may be a neuropathy.  Another thing that Crystal Larsen has observed is that she is easier to sleep for example in the bathtub and that she does not feel the surface her feet touch very well.  The shooting pains she describes seem to come from the heel to the tos, mostly on the right foot, not ascending and not affecting the back.  ? ?In terms of her migraines, she is in excellent control for several years now.  ? ? ?She is now retired , used to Psychologist, occupational at [employer], but due to hip pain and needing a cane , she was not able to return after the pandemic.  ?She is now just followed by her rheumatologist but also by nephrology.  Her primary care physician is Alessandra Grout.   ? ?She has been treated here with valproic sodium 1 tablet daily she has taken hydrocortisone after she developed adrenal insufficiency it is essential that she remains on some dose of steroids, for hip pain oxycodone has been prescribed as needed.  She is taking progesterone, Effexor, iron supplement, Niaspan, folate acid, dicyclomine, Benlysta which is an intravenous medication for her lupus condition, losartan his potassium and hydrochlorothiazide, amlodipine, and levocetirizine for allergies. ? ? ? ? ? ? ?My last notes with this patient were in regards to post concussion syndrome: 03/27/18 ,  and I see the patient for a RV. She is doing well.  This is good reports that she has done very well on Depakote as a headache preventative but she does have a period of 2 to 3 weeks with almost relentless headaches she woke up this time, she went to bed with son and they have meanwhile resolved she is not sure what brought them on what led to their disappearance.  Overall the patient is doing very well she is looking healthy and feeling well at this time.  I will refill her medication today I think it would be fine if we need once a  year from now on. ? ?Crystal Larsen is a 67 year old female with a history of postconcussive syndrome. She returns today for follow-up. Her headaches had essentially resolved until about 2-3 months ago. She states that she has 2 types of headaches. She reports that she will get an aura that consists of changes with her vision. She reports that those changes including blurry vision and the inability to focus on objects. She reports that this will last 30-45 minutes but is typically never followed by a headache. She states that it occurs approximately once every 3 weeks. She does note that fluorescent lights and bright lights are triggers for these events. She reports that she does have headaches that occur consecutively for 6-8 days a month. These are not associated with the visual changes.  These headaches are typically located in the frontal region and behind the eyes. She denies photophobia and phonophobia as well as nausea and vomiting. She states that she does feel that she has a fullness in the maxillary regions bilaterally. She is on four anti-histamines due to itching. She does not feel that the anti-histamines has made the headaches worse or better. In the past she has been on Depakote and that essentially resolved her headaches. She returns today for an evaluation. ? ?HISTORY 07/11/16: Crystal Larsen is a 66 year old female with a history of postconcussive syndrome- The concussion occurred 24 years ago. She was originally referred by Dr. Nolon Rod in 2015. The patient was taking Depakote for migraine- after the last visit she decreased her dose by half- taking 250 mg daily and meanwhile discontinued the medication completely.  ?The patient reports that she has not had any migraine headaches. She continues to notice some changes in her memory. She has trouble remembering conversations but can recall it later on. Denies having to give up anything due to her perceived memory disturbance. She is able to complete all ADLs  independently. She operates a Teacher, music without difficulty. Denies any new neurological symptoms. She returns today for an evaluation after having had 3-4 migrainous auras without any pain. She called the office and was told to take Depakote as a pain medication, not a preventive. I explained today that we should use IV Depakote for headaches, not for aura.  ?She is here for a repeat MOCA. Crystal Larsen reports that she sometimes misses parts of the conversation that her family members may recall. She especially mentioned an emergency room visit after which she was convinced that she was told she had muscle spasms but neither her husband nor her daughter recalls her being given a diagnosis at all. ?  ? ? ?Review of Systems: ?Out of a complete 14 system review, the patient complains of only the following symptoms, and all other reviewed systems are negative. ? Shooting foot pain, not ascending.  ?Shrinking of  lower extremity muscle, bilaterally,  but no myalgia. ? ?Foot swelling more on the left.  More of a feeling than a true edema.  ? ?I can only suggest gabapentin or Lyrica for pain reduction. ? ?Social History  ? ?Socioeconomic History  ? Marital status: Married  ?  Spouse name: Not on file  ? Number of children: , son born in 61, age 70, divorced and unemployed-  and daughter is born 19, 72, doing well.   ? Years of education: Not on file  ? Highest education level: BS degree, 4 years of college.   ?Occupational History  ? Occupation: Retired from city of [employer].   ?Tobacco Use  ? Smoking status: Never  ? Smokeless tobacco: Never  ?Vaping Use  ? Vaping Use: Never  ?Substance and Sexual Activity  ? Alcohol use: Yes  ?  Alcohol/week: 1.0 standard drink  ?  Types: 1 Glasses of wine per week  ?  Comment: occasional  ? Drug use: No  ? Sexual activity: Not Currently  ?Other Topics Concern  ? Not on file  ?Social History Narrative  ? Right handed. Caffeine 3 cups coffee daily, BS, Married, 2  kids.   ? ?Social Determinants of Health  ? ?Financial Resource Strain: Not on file  ?Food Insecurity: Not on file  ?Transportation Needs: Not on file  ?Physical Activity: Not on file  ?Stress: Not on fil

## 2022-02-13 NOTE — Patient Instructions (Addendum)
Patient tolerates mycophenolate well. Lupus seems controlled. BENLYSTA infusion .  She will follow with rheumatology . Make sure you get a bone density test for the hip. You have a higher risk of avascular necrosis and steroid induced osteoporosis.  ? ?She advised me, she cannot take GABAPENTIN, I wrote for EMLA cream topical.  ?Gabapentin caused her palms to itch- I have suggested Lyrica at lowest dose as Plan B. 25 mg bid was given as a print prescription. ? ? ?No revisit will be scheduled right now, the patient can follow up with Primary care .  ?Prn RV upon request.  ? ?Pregabalin Capsules ?What is this medication? ?PREGABALIN (pre GAB a lin) treats nerve pain. It may also be used to prevent and control seizures in people with epilepsy. It works by calming overactive nerves in your body. ?This medicine may be used for other purposes; ask your health care provider or pharmacist if you have questions. ?COMMON BRAND NAME(S): Lyrica ?What should I tell my care team before I take this medication? ?They need to know if you have any of these conditions: ?Drug abuse or addiction ?Heart failure ?Kidney disease ?Lung disease ?Suicidal thoughts, plans or attempt ?An unusual or allergic reaction to pregabalin, other medications, foods, dyes, or preservatives ?Pregnant or trying to get pregnant ?Breast-feeding ?How should I use this medication? ?Take this medication by mouth with water. Take it as directed on the prescription label at the same time every day. You can take it with or without food. If it upsets your stomach, take it with food. Keep taking it unless your care team tells you to stop. ?A special MedGuide will be given to you by the pharmacist with each prescription and refill. Be sure to read this information carefully each time. ?Talk to your care team about the use of this medication in children. While it may be prescribed for children as young as 1 month for selected conditions, precautions do  apply. ?Overdosage: If you think you have taken too much of this medicine contact a poison control center or emergency room at once. ?NOTE: This medicine is only for you. Do not share this medicine with others. ?What if I miss a dose? ?If you miss a dose, take it as soon as you can. If it is almost time for your next dose, take only that dose. Do not take double or extra doses. ?What may interact with this medication? ?Alcohol ?Antihistamines for allergy, cough, and cold ?Certain medications for anxiety or sleep ?Certain medications for blood pressure, heart disease ?Certain medications for depression like amitriptyline, fluoxetine, sertraline ?Certain medications for diabetes, like pioglitazone, rosiglitazone ?Certain medications for seizures like phenobarbital, primidone ?General anesthetics like halothane, isoflurane, methoxyflurane, propofol ?Medications that relax muscles for surgery ?Narcotic medications for pain ?Phenothiazines like chlorpromazine, mesoridazine, prochlorperazine, thioridazine ?This list may not describe all possible interactions. Give your health care provider a list of all the medicines, herbs, non-prescription drugs, or dietary supplements you use. Also tell them if you smoke, drink alcohol, or use illegal drugs. Some items may interact with your medicine. ?What should I watch for while using this medication? ?Visit your care team for regular checks on your progress. Tell your care team if your symptoms do not start to get better or if they get worse. ?Do not suddenly stop taking this medication. You may develop a severe reaction. Your care team will tell you how much medication to take. If your care team wants you to stop the medication, the dose  may be slowly lowered over time to avoid any side effects. ?You may get drowsy or dizzy. Do not drive, use machinery, or do anything that needs mental alertness until you know how this medication affects you. Do not stand up or sit up quickly,  especially if you are an older patient. This reduces the risk of dizzy or fainting spells. Alcohol may interfere with the effect of this medication. Avoid alcoholic drinks. ?If you or your family notice any changes in your behavior, such as new or worsening depression, thoughts of harming yourself, anxiety, other unusual or disturbing thoughts, or memory loss, call your care team right away. ?Wear a medical ID bracelet or chain if you are taking this medication for seizures. Carry a card that describes your condition. List the medications and doses you take on the card. ?This medication may make it more difficult to father a child. Talk to your care team if you are concerned about your fertility. ?What side effects may I notice from receiving this medication? ?Side effects that you should report to your care team as soon as possible: ?Allergic reactions or angioedema--skin rash, itching, hives, swelling of the face, eyes, lips, tongue, arms, or legs, trouble swallowing or breathing ?Blurry vision ?Thoughts of suicide or self-harm, worsening mood, feelings of depression ?Trouble breathing ?Side effects that usually do not require medical attention (report to your care team if they continue or are bothersome): ?Dizziness ?Drowsiness ?Dry mouth ?Nausea ?Swelling of the ankles, feet, hands ?Vomiting ?Weight gain ?This list may not describe all possible side effects. Call your doctor for medical advice about side effects. You may report side effects to FDA at 1-800-FDA-1088. ?Where should I keep my medication? ?Keep out of the reach of children and pets. This medication can be abused. Keep it in a safe place to protect it from theft. Do not share it with anyone. It is only for you. Selling or giving away this medication is dangerous and against the law. ?Store at 25 degrees C (77 degrees F). Get rid of any unused medication after the expiration date. ?This medication may cause harm and death if it is taken by other  adults, children, or pets. It is important to get rid of the medication as soon as you no longer need it, or it is expired. You can do this in two ways: ?Take the medication to a medication take-back program. Check with your pharmacy or law enforcement to find a location. ?If you cannot return the medication, check the label or package insert to see if the medication should be thrown out in the garbage or flushed down the toilet. If you are not sure, ask your care team. If it is safe to put it in the trash, take the medication out of the container. Mix the medication with cat litter, dirt, coffee grounds, or other unwanted substance. Seal the mixture in a bag or container. Put it in the trash. ?NOTE: This sheet is a summary. It may not cover all possible information. If you have questions about this medicine, talk to your doctor, pharmacist, or health care provider. ?? 2022 Elsevier/Gold Standard (2020-11-17 00:00:00) ?Axonal Demyelinating Polyneuropathy ?Chronic inflammatory demyelinating polyneuropathy (CIDP) is a condition in which the nerves in an extremity, such as a hand or foot, do not work as they should. Normally, nerves are surrounded and protected by a fatty covering called the myelin sheath. The myelin sheath helps the nerves conduct electricity. Damage to the myelin can lead to a short in  the system and failure of the nerves to work correctly. This condition happens when the body's defense system (immune system) damages the myelin sheath. This is called an autoimmune condition. ?CIDP is associated with autoimmune conditions or other conditions such as diabetes, human  virus , hepatitis B or C infection, [place]denstr?m macroglobulinemia, multiple myeloma, and osteosclerotic myeloma. ?What are the causes? ?This condition occurs when the body's immune system accidentally attacks the myelin sheath. ?What are the signs or symptoms? ?Symptoms of this condition include: ?Numbness or tingling of the hands and  feet. ?Weakness, especially in the hips, shoulders, hands, and feet. ?Problems walking. ?Weak or absent reflexes. ?How is this diagnosed? ?This condition may be diagnosed with: ?A physical exam. ?A spinal tap (lumbar puncture) t

## 2022-02-15 ENCOUNTER — Encounter: Payer: Self-pay | Admitting: Plastic Surgery

## 2022-02-15 ENCOUNTER — Other Ambulatory Visit: Payer: Self-pay

## 2022-02-15 ENCOUNTER — Other Ambulatory Visit: Payer: Self-pay | Admitting: Neurology

## 2022-02-15 ENCOUNTER — Ambulatory Visit: Payer: Medicare Other | Admitting: Plastic Surgery

## 2022-02-15 ENCOUNTER — Other Ambulatory Visit (HOSPITAL_COMMUNITY)
Admission: RE | Admit: 2022-02-15 | Discharge: 2022-02-15 | Disposition: A | Discharge status: Home / Self Care | Payer: Medicare Other | Source: Ambulatory Visit | Attending: Plastic Surgery | Admitting: Plastic Surgery

## 2022-02-15 VITALS — BP 139/82 | HR 81

## 2022-02-15 DIAGNOSIS — L723 Sebaceous cyst: Secondary | ICD-10-CM

## 2022-02-15 NOTE — Progress Notes (Signed)
Operative Note  ? ?DATE OF OPERATION: 02/15/2022 ? ?LOCATION:   ? ?SURGICAL DEPARTMENT: Plastic Surgery ? ?PREOPERATIVE DIAGNOSES: Left forehead cyst ? ?POSTOPERATIVE DIAGNOSES:  same ? ?PROCEDURE:  ?Excision of left forehead cyst measuring 2.5 cm ?Complex closure measuring 2.5 cm ? ?SURGEON: Talmadge Coventry, MD ? ?ANESTHESIA:  Local ? ?COMPLICATIONS: None.  ? ?INDICATIONS FOR PROCEDURE:  ?The patient, Crystal Larsen is a 66 y.o. female born on 06/05/1956, is here for treatment of forehead cyst ?MRN: [id number] ? ?CONSENT:  ?Informed consent was obtained directly from the patient. Risks, benefits and alternatives were fully discussed. Specific risks including but not limited to bleeding, infection, hematoma, seroma, scarring, pain, infection, wound healing problems, and need for further surgery were all discussed. The patient did have an ample opportunity to have questions answered to satisfaction.  ? ?DESCRIPTION OF PROCEDURE:  ?Local anesthesia was administered. The patient's operative site was prepped and draped in a sterile fashion. A time out was performed and all information was confirmed to be correct.  The lesion was excised with a 15 blade.  Hemostasis was obtained.  Circumferential undermining was performed and the skin was advanced and closed in layers with interrupted buried Monocryl sutures and 5-0 fast gut for the skin.  The lesion excised measured 2.5 cm, and the total length of closure measured 2.5 cm.   ? ?The patient tolerated the procedure well.  There were no complications. ?  ? ?

## 2022-02-16 ENCOUNTER — Ambulatory Visit: Payer: Medicare Other | Admitting: Plastic Surgery

## 2022-02-16 ENCOUNTER — Encounter: Payer: Self-pay | Admitting: Neurology

## 2022-02-17 ENCOUNTER — Telehealth: Payer: Self-pay

## 2022-02-17 NOTE — Telephone Encounter (Signed)
Per Matt, bleeding from the excision site and eye being swollen based off where the cyst was taken is normal. Adv pt to hold pressure to the excision site to minimize bleeding and to take ibuprofen for swelling. Pt conveyed understanding.  ?

## 2022-02-17 NOTE — Telephone Encounter (Signed)
Patient called to say she came in on Wednesday of this week and had a cyst removed from her forehead.  She said she is bleeding from the site and would like to know if this is normal.  Also, she said her left eye is swollen and the cyst was removed from the left side of her forehead.  She said she isn't sure if the two are related.  Please call. ?

## 2022-02-20 ENCOUNTER — Other Ambulatory Visit: Payer: Self-pay | Admitting: Neurology

## 2022-02-20 DIAGNOSIS — G43909 Migraine, unspecified, not intractable, without status migrainosus: Secondary | ICD-10-CM

## 2022-02-20 MED ORDER — DESVENLAFAXINE SUCCINATE ER 50 MG PO TB24: 50.0000 mg | ORAL_TABLET | Freq: Every day | ORAL | 0 refills | Status: DC

## 2022-02-20 MED ORDER — DIVALPROEX SODIUM ER 250 MG PO TB24: 250.0000 mg | ORAL_TABLET | Freq: Every day | ORAL | 2 refills | Status: AC

## 2022-02-21 LAB — SURGICAL PATHOLOGY

## 2022-02-26 ENCOUNTER — Encounter: Payer: Self-pay | Admitting: Neurology

## 2022-03-01 ENCOUNTER — Ambulatory Visit: Payer: Medicare Other | Admitting: Plastic Surgery

## 2022-03-01 ENCOUNTER — Other Ambulatory Visit: Payer: Self-pay

## 2022-03-01 DIAGNOSIS — L723 Sebaceous cyst: Secondary | ICD-10-CM | POA: Diagnosis not present

## 2022-03-01 NOTE — Progress Notes (Signed)
Patient presents 2 weeks postop from excision of forehead sebaceous cyst.  She feels like things are going well.  On exam everything looks to be healing appropriately.  We discussed scar management going forward.  All of her questions were answered and we will see her again on an as-needed basis. ?

## 2022-04-12 ENCOUNTER — Telehealth: Payer: Self-pay | Admitting: Plastic Surgery

## 2022-04-12 NOTE — Telephone Encounter (Signed)
Patient called in today as the procedure site from her surgery on March 22nd started bleeding the day after surgery. She said she went for follow-up on april 5th to be evaluated. However, she states that the site is not healing well since that visit. She would like to talk to staff. The area does not seem to be bleeding now.  ?

## 2022-04-13 NOTE — Telephone Encounter (Signed)
Scheduled pt to come in to see Matt next Wed, 5/24 @ 11:15 to f/u on excision site. Pt agreed to appt. She reported that the site bled a couple times after the Px but has had a scab on it since March and it's raising concern. Adv pt to call us if the scab comes off or if she notices any changes. She conveyed understanding.

## 2022-04-19 ENCOUNTER — Encounter: Payer: Self-pay | Admitting: Surgical

## 2022-04-19 ENCOUNTER — Ambulatory Visit (INDEPENDENT_AMBULATORY_CARE_PROVIDER_SITE_OTHER): Payer: Medicare Other | Admitting: Surgical

## 2022-04-19 DIAGNOSIS — L723 Sebaceous cyst: Secondary | ICD-10-CM

## 2022-04-19 NOTE — Progress Notes (Signed)
Patient is a 66 year old female here for follow-up after excision of forehead sebaceous cyst with Dr. Claudia Desanctis on 02/15/2022.  She presents today for concerns with the appearance of the incision.  She reports overall she is doing well, reports she has had a small scab over the forehead incision and is worried because she thought that it would be well-healed and gone by now.  She is otherwise doing well.  On exam forehead incision is healing well, she does have a small pinpoint scab just above the incision on the lateral aspect.  There is no surrounding erythema.  There is no drainage noted.  Discussed with patient that this may be a scab from where the suture was protruding through the skin or where she had some drainage, recommend continuing to monitor the area.  There is no signs of infection.  We discussed sunscreen to prevent discoloration of the scar.  Patient was relieved after evaluation, recommend calling with questions or concerns and following up as needed.  I do not see any signs of concern today.

## 2022-04-23 ENCOUNTER — Other Ambulatory Visit: Payer: Self-pay | Admitting: Neurology

## 2022-05-01 ENCOUNTER — Other Ambulatory Visit: Payer: Self-pay | Admitting: Oncology

## 2022-05-14 ENCOUNTER — Other Ambulatory Visit: Payer: Self-pay | Admitting: Neurology

## 2022-09-26 ENCOUNTER — Other Ambulatory Visit (HOSPITAL_BASED_OUTPATIENT_CLINIC_OR_DEPARTMENT_OTHER): Payer: Self-pay | Admitting: Neurological Surgery

## 2022-09-26 DIAGNOSIS — M5416 Radiculopathy, lumbar region: Secondary | ICD-10-CM

## 2022-09-27 ENCOUNTER — Ambulatory Visit (HOSPITAL_BASED_OUTPATIENT_CLINIC_OR_DEPARTMENT_OTHER)
Admission: RE | Admit: 2022-09-27 | Discharge: 2022-09-27 | Disposition: A | Discharge status: Home / Self Care | Payer: Medicare Other | Source: Ambulatory Visit | Attending: Neurological Surgery | Admitting: Neurological Surgery

## 2022-09-27 DIAGNOSIS — M5416 Radiculopathy, lumbar region: Secondary | ICD-10-CM | POA: Diagnosis not present

## 2022-10-11 ENCOUNTER — Other Ambulatory Visit: Payer: Self-pay | Admitting: Neurological Surgery

## 2022-10-31 NOTE — Progress Notes (Signed)
Surgical Instructions    Your procedure is scheduled on Tuesday December 12.  Report to [facility] Main Entrance "A" at 5:30 A.M., then check in with the Admitting office.  Call this number if you have problems the morning of surgery:  [phone]   If you have any questions prior to your surgery date call [phone]: Open Monday-Friday 8am-4pm If you experience any cold or flu symptoms such as cough, fever, chills, shortness of breath, etc. between now and your scheduled surgery, please notify us at the above number     Remember:  Do not eat after midnight the night before your surgery  You may drink clear liquids until 4:30am the morning of your surgery.   Clear liquids allowed are: Water, Non-Citrus Juices (without pulp), Carbonated Beverages, Clear Tea, Black Coffee ONLY (NO MILK, CREAM OR POWDERED CREAMER of any kind), and Gatorade    Take these medicines the morning of surgery with A SIP OF WATER:  acetaminophen (TYLENOL) esvenlafaxine (PRISTIQ)  dicyclomine (BENTYL) if needed divalproex (DEPAKOTE ER)  hydrocortisone (CORTEF)  levothyroxine (SYNTHROID)  mycophenolate (CELLCEPT)  oxymetazoline (AFRIN) if needed  As of today, STOP taking any Aspirin (unless otherwise instructed by your surgeon) Aleve, Naproxen, Ibuprofen, Motrin, Advil, Goody's, BC's, all herbal medications, fish oil, and all vitamins.           Do not wear jewelry or makeup. Do not wear lotions, powders, perfumes/cologne or deodorant. Do not shave 48 hours prior to surgery.  Men may shave face and neck. Do not bring valuables to the hospital. Do not wear nail polish, gel polish, artificial nails, or any other type of covering on natural nails (fingers and toes) If you have artificial nails or gel coating that need to be removed by a nail salon, please have this removed prior to surgery. Artificial nails or gel coating may interfere with anesthesia's ability to adequately monitor your vital signs.  [facility] is not responsible for any belongings or valuables.    Do NOT Smoke (Tobacco/Vaping)  24 hours prior to your procedure  If you use a CPAP at night, you may bring your mask for your overnight stay.   Contacts, glasses, hearing aids, dentures or partials may not be worn into surgery, please bring cases for these belongings   For patients admitted to the hospital, discharge time will be determined by your treatment team.   Patients discharged the day of surgery will not be allowed to drive home, and someone needs to stay with them for 24 hours.   SURGICAL WAITING ROOM VISITATION Patients having surgery or a procedure may have no more than 2 support people in the waiting area - these visitors may rotate.   Children under the age of 58 must have an adult with them who is not the patient. If the patient needs to stay at the hospital during part of their recovery, the visitor guidelines for inpatient rooms apply. Pre-op nurse will coordinate an appropriate time for 1 support person to accompany patient in pre-op.  This support person may not rotate.   Please refer to [email or web address] for the visitor guidelines for Inpatients (after your surgery is over and you are in a regular room).    Special instructions:    Oral Hygiene is also important to reduce your risk of infection.  Remember - BRUSH YOUR TEETH THE MORNING OF SURGERY WITH YOUR REGULAR TOOTHPASTE   [facility]- Preparing For Surgery  Before surgery, you can play an important role. Because  skin is not sterile, your skin needs to be as free of germs as possible. You can reduce the number of germs on your skin by washing with CHG (chlorahexidine gluconate) Soap before surgery.  CHG is an antiseptic cleaner which kills germs and bonds with the skin to continue killing germs even after washing.     Please do not use if you have an allergy to CHG or antibacterial soaps. If your  skin becomes reddened/irritated stop using the CHG.  Do not shave (including legs and underarms) for at least 48 hours prior to first CHG shower. It is OK to shave your face.  Please follow these instructions carefully.     Shower the NIGHT BEFORE SURGERY and the MORNING OF SURGERY with CHG Soap.   If you chose to wash your hair, wash your hair first as usual with your normal shampoo. After you shampoo, rinse your hair and body thoroughly to remove the shampoo.  Then [employer] and genitals (private parts) with your normal soap and rinse thoroughly to remove soap.  After that Use CHG Soap as you would any other liquid soap. You can apply CHG directly to the skin and wash gently with a scrungie or a clean washcloth.   Apply the CHG Soap to your body ONLY FROM THE NECK DOWN.  Do not use on open wounds or open sores. Avoid contact with your eyes, ears, mouth and genitals (private parts). Wash Face and genitals (private parts)  with your normal soap.   Wash thoroughly, paying special attention to the area where your surgery will be performed.  Thoroughly rinse your body with warm water from the neck down.  DO NOT shower/wash with your normal soap after using and rinsing off the CHG Soap.  Pat yourself dry with a CLEAN TOWEL.  Wear CLEAN PAJAMAS to bed the night before surgery  Place CLEAN SHEETS on your bed the night before your surgery  DO NOT SLEEP WITH PETS.   Day of Surgery:  Take a shower with CHG soap. Wear Clean/Comfortable clothing the morning of surgery Do not apply any deodorants/lotions.   Remember to brush your teeth WITH YOUR REGULAR TOOTHPASTE.    If you received a COVID test during your pre-op visit, it is requested that you wear a mask when out in public, stay away from anyone that may not be feeling well, and notify your surgeon if you develop symptoms. If you have been in contact with anyone that has tested positive in the last 10 days, please notify your  surgeon.    Please read over the following fact sheets that you were given.

## 2022-11-01 ENCOUNTER — Other Ambulatory Visit: Payer: Self-pay

## 2022-11-01 ENCOUNTER — Encounter (HOSPITAL_COMMUNITY): Payer: Self-pay

## 2022-11-01 ENCOUNTER — Encounter (HOSPITAL_COMMUNITY)
Admission: RE | Admit: 2022-11-01 | Discharge: 2022-11-01 | Disposition: A | Discharge status: Home / Self Care | Payer: Medicare Other | Source: Ambulatory Visit | Attending: Neurological Surgery | Admitting: Neurological Surgery

## 2022-11-01 VITALS — BP 145/87 | HR 78 | Temp 98.0°F | Resp 18 | Ht 66.0 in | Wt 173.5 lb

## 2022-11-01 DIAGNOSIS — Z01818 Encounter for other preprocedural examination: Secondary | ICD-10-CM | POA: Diagnosis present

## 2022-11-01 LAB — COMPREHENSIVE METABOLIC PANEL
ALT: 26 U/L (ref 0–44)
AST: 38 U/L (ref 15–41)
Albumin: 3.9 g/dL (ref 3.5–5.0)
Alkaline Phosphatase: 61 U/L (ref 38–126)
Anion gap: 7 (ref 5–15)
BUN: 21 mg/dL (ref 8–23)
CO2: 27 mmol/L (ref 22–32)
Calcium: 9.7 mg/dL (ref 8.9–10.3)
Chloride: 105 mmol/L (ref 98–111)
Creatinine, Ser: 1.32 mg/dL — ABNORMAL HIGH (ref 0.44–1.00)
GFR, Estimated: 44 mL/min — ABNORMAL LOW (ref >60)
Glucose, Bld: 116 mg/dL — ABNORMAL HIGH (ref 70–99)
Potassium: 4 mmol/L (ref 3.5–5.1)
Sodium: 139 mmol/L (ref 135–145)
Total Bilirubin: 0.7 mg/dL (ref 0.3–1.2)
Total Protein: 6.9 g/dL (ref 6.5–8.1)

## 2022-11-01 LAB — CBC
HCT: 40.9 % (ref 36.0–46.0)
Hemoglobin: 14 g/dL (ref 12.0–15.0)
MCH: 30.8 pg (ref 26.0–34.0)
MCHC: 34.2 g/dL (ref 30.0–36.0)
MCV: 89.9 fL (ref 80.0–100.0)
Platelets: 270 10*3/uL (ref 150–400)
RBC: 4.55 MIL/uL (ref 3.87–5.11)
RDW: 14.2 % (ref 11.5–15.5)
WBC: 6.1 10*3/uL (ref 4.0–10.5)
nRBC: 0 % (ref 0.0–0.2)

## 2022-11-01 LAB — TYPE AND SCREEN
ABO/RH(D): O POS
Antibody Screen: NEGATIVE

## 2022-11-01 LAB — SURGICAL PCR SCREEN
MRSA, PCR: NEGATIVE
Staphylococcus aureus: NEGATIVE

## 2022-11-01 NOTE — Progress Notes (Addendum)
PCP - Candiss Norse Cardiologist - denies Neurologist-Carmen Dohmeier MD Nephrologist- Gean Quint MD  PPM/ICD - denies Device Orders -  Rep Notified -   Chest x-ray - 05/02/19 EKG - 11/01/22 Stress Test - none ECHO - none Cardiac Cath - none  Sleep Study - pt states she had a sleep study at least 15 years ago but no results found in Chart Review or Care Everywhere  CPAP - no  Fasting Blood Sugar- na Checks Blood Sugar _____ times a day  Last dose of GLP1 agonist-  na GLP1 instructions:   Blood Thinner Instructions:na Aspirin Instructions:Pt states she will call Dr. Clarice Pole office for instructions regarding when to stop aspirin.   ERAS Protcol -clear liquids until 0430 PRE-SURGERY Ensure or G2- no  COVID TEST- na   Anesthesia review: yes- pt has adrenal insuffieciency and lupus  Patient denies shortness of breath, fever, cough and chest pain at PAT appointment   All instructions explained to the patient, with a verbal understanding of the material. Patient agrees to go over the instructions while at home for a better understanding. Patient also instructed to wear a mask when out in public prior to surgery. . The opportunity to ask questions was provided.

## 2022-11-02 NOTE — Progress Notes (Signed)
Anesthesia Chart Review:  Follows with rheumatologist Dr. Trudie Reed for hx of systemic lupus with features of lupus nephritis and inflammatory arthritis. She also has Hashimoto's and adrenal insufficiency. She is maintained on mycophenolate, hydroxycholroquine, belimumab, and hydrocortisone 15-'20mg'$  daily. Last seen 07/26/22, had some icnreased swelling in her hands, but otherwise overall stable, no changes, 4 month followup recommended.   Adrenal insufficiency is primarily managed by endocrinologist Dr. Buddy Duty.  I spoke with Dr. Clarice Pole surgical scheduler and she stated they reached out to Dr. Buddy Duty for perioperative instructions regarding management of adrenal insufficiency. She stated Dr. Buddy Duty subsequently reached out to the pt and instructed her to double her dose of hydrocortisone the day of and for 3 days following surgery.   Follows with neurology for chronic postconcussive migraines, controlled on depakote.   Preop labs reviewed, creatinine mildly elevated 1.32, otherwise unremarkable.  EKG 11/01/22: NSR. Rate 66.    Wynonia Musty [facility] Short Stay Center/Anesthesiology Phone [phone] 11/06/2022 1:59 PM

## 2022-11-06 NOTE — Anesthesia Preprocedure Evaluation (Addendum)
Anesthesia Evaluation  Patient identified by MRN, date of birth, ID band Patient awake    Reviewed: Allergy & Precautions, NPO status , Patient's Chart, lab work & pertinent test results  History of Anesthesia Complications (+) PONV and history of anesthetic complications  Airway Mallampati: II  TM Distance: >3 FB Neck ROM: Full    Dental no notable dental hx. (+) Dental Advisory Given, Teeth Intact   Pulmonary sleep apnea    Pulmonary exam normal breath sounds clear to auscultation       Cardiovascular hypertension, Pt. on medications Normal cardiovascular exam Rhythm:Regular Rate:Normal     Neuro/Psych  Headaches, Seizures -,   Anxiety      Neuromuscular disease    GI/Hepatic ,GERD  ,,  Endo/Other  Hypothyroidism    Renal/GU Renal disease     Musculoskeletal  (+) Arthritis ,    Abdominal   Peds  Hematology  (+) Blood dyscrasia, anemia   Anesthesia Other Findings   Reproductive/Obstetrics                             Anesthesia Physical Anesthesia Plan  ASA: 3  Anesthesia Plan: General   Post-op Pain Management: Tylenol PO (pre-op)* and Gabapentin PO (pre-op)*   Induction: Intravenous  PONV Risk Score and Plan: 4 or greater and Ondansetron, Dexamethasone, Treatment may vary due to age or medical condition and Propofol infusion  Airway Management Planned: Oral ETT  Additional Equipment: None  Intra-op Plan:   Post-operative Plan: Extubation in OR  Informed Consent: I have reviewed the patients History and Physical, chart, labs and discussed the procedure including the risks, benefits and alternatives for the proposed anesthesia with the patient or authorized representative who has indicated his/her understanding and acceptance.     Dental advisory given  Plan Discussed with: CRNA  Anesthesia Plan Comments: (PAT note by Karoline Caldwell, PA-C: Follows with rheumatologist Dr.  Trudie Reed for hx of systemic lupus with features of lupus nephritis and inflammatory arthritis. She also has Hashimoto's and adrenal insufficiency. She is maintained on mycophenolate, hydroxycholroquine, belimumab, and hydrocortisone 15-'20mg'$  daily. Last seen 07/26/22, had some icnreased swelling in her hands, but otherwise overall stable, no changes, 4 month followup recommended.   Adrenal insufficiency is primarily managed by endocrinologist Dr. Buddy Duty.  I spoke with Dr. Clarice Pole surgical scheduler and she stated they reached out to Dr. Buddy Duty for perioperative instructions regarding management of adrenal insufficiency. She stated Dr. Buddy Duty subsequently reached out to the pt and instructed her to double her dose of hydrocortisone the day of and for 3 days following surgery.   Follows with neurology for chronic postconcussive migraines, controlled on depakote.   Preop labs reviewed, creatinine mildly elevated 1.32, otherwise unremarkable.  EKG 11/01/22: NSR. Rate 66.   )        Anesthesia Quick Evaluation

## 2022-11-07 ENCOUNTER — Encounter (HOSPITAL_COMMUNITY): Payer: Self-pay | Admitting: Neurological Surgery

## 2022-11-07 ENCOUNTER — Ambulatory Visit (HOSPITAL_BASED_OUTPATIENT_CLINIC_OR_DEPARTMENT_OTHER): Payer: Medicare Other | Admitting: Physician Assistant

## 2022-11-07 ENCOUNTER — Other Ambulatory Visit: Payer: Self-pay

## 2022-11-07 ENCOUNTER — Ambulatory Visit (HOSPITAL_COMMUNITY): Payer: Medicare Other

## 2022-11-07 ENCOUNTER — Observation Stay (HOSPITAL_COMMUNITY)
Admission: RE | Admit: 2022-11-07 | Discharge: 2022-11-08 | Disposition: A | Discharge status: Home / Self Care | Payer: Medicare Other | Source: Ambulatory Visit | Attending: Neurological Surgery | Admitting: Neurological Surgery

## 2022-11-07 ENCOUNTER — Encounter (HOSPITAL_COMMUNITY)
Admission: RE | Disposition: A | Discharge status: Home / Self Care | Payer: Self-pay | Source: Ambulatory Visit | Attending: Neurological Surgery

## 2022-11-07 ENCOUNTER — Ambulatory Visit (HOSPITAL_COMMUNITY): Payer: Medicare Other | Admitting: Physician Assistant

## 2022-11-07 DIAGNOSIS — Z7982 Long term (current) use of aspirin: Secondary | ICD-10-CM | POA: Diagnosis not present

## 2022-11-07 DIAGNOSIS — M48062 Spinal stenosis, lumbar region with neurogenic claudication: Secondary | ICD-10-CM | POA: Diagnosis not present

## 2022-11-07 DIAGNOSIS — Z79899 Other long term (current) drug therapy: Secondary | ICD-10-CM | POA: Insufficient documentation

## 2022-11-07 DIAGNOSIS — M960 Pseudarthrosis after fusion or arthrodesis: Secondary | ICD-10-CM | POA: Diagnosis not present

## 2022-11-07 DIAGNOSIS — S32009K Unspecified fracture of unspecified lumbar vertebra, subsequent encounter for fracture with nonunion: Secondary | ICD-10-CM | POA: Diagnosis present

## 2022-11-07 DIAGNOSIS — I10 Essential (primary) hypertension: Secondary | ICD-10-CM | POA: Insufficient documentation

## 2022-11-07 DIAGNOSIS — M4726 Other spondylosis with radiculopathy, lumbar region: Secondary | ICD-10-CM

## 2022-11-07 DIAGNOSIS — E039 Hypothyroidism, unspecified: Secondary | ICD-10-CM | POA: Diagnosis not present

## 2022-11-07 DIAGNOSIS — Z91040 Latex allergy status: Secondary | ICD-10-CM | POA: Diagnosis not present

## 2022-11-07 DIAGNOSIS — M48061 Spinal stenosis, lumbar region without neurogenic claudication: Secondary | ICD-10-CM | POA: Diagnosis not present

## 2022-11-07 HISTORY — PX: LAMINECTOMY WITH POSTERIOR LATERAL ARTHRODESIS LEVEL 1: SHX6335

## 2022-11-07 SURGERY — LAMINECTOMY WITH POSTERIOR LATERAL ARTHRODESIS LEVEL 1
Anesthesia: General | Site: Back

## 2022-11-07 MED ORDER — ORAL CARE MOUTH RINSE: 15.0000 mL | Freq: Once | OROMUCOSAL | Status: AC

## 2022-11-07 MED ORDER — OXYMETAZOLINE HCL 0.05 % NA SOLN: 1.0000 | Freq: Two times a day (BID) | NASAL | Status: DC | PRN

## 2022-11-07 MED ORDER — LEVOCETIRIZINE DIHYDROCHLORIDE 5 MG PO TABS: 5.0000 mg | ORAL_TABLET | Freq: Every day | ORAL | Status: DC

## 2022-11-07 MED ORDER — LIDOCAINE-EPINEPHRINE 1 %-1:100000 IJ SOLN
INTRAMUSCULAR | Status: AC
  Filled 2022-11-07: qty 1

## 2022-11-07 MED ORDER — MIDAZOLAM HCL 2 MG/2ML IJ SOLN
INTRAMUSCULAR | Status: AC
  Filled 2022-11-07: qty 2

## 2022-11-07 MED ORDER — MEPERIDINE HCL 25 MG/ML IJ SOLN: 6.2500 mg | INTRAMUSCULAR | Status: DC | PRN

## 2022-11-07 MED ORDER — ONDANSETRON HCL 4 MG/2ML IJ SOLN: 4.0000 mg | Freq: Four times a day (QID) | INTRAMUSCULAR | Status: DC | PRN

## 2022-11-07 MED ORDER — PROPOFOL 10 MG/ML IV BOLUS
INTRAVENOUS | Status: DC | PRN
  Administered 2022-11-07: 100 mg via INTRAVENOUS

## 2022-11-07 MED ORDER — THROMBIN 5000 UNITS EX SOLR
CUTANEOUS | Status: AC
  Filled 2022-11-07: qty 5000

## 2022-11-07 MED ORDER — SENNA 8.6 MG PO TABS
1.0000 | ORAL_TABLET | Freq: Two times a day (BID) | ORAL | Status: DC
  Administered 2022-11-07 – 2022-11-08 (×3): 8.6 mg via ORAL
  Filled 2022-11-07 (×3): qty 1

## 2022-11-07 MED ORDER — THROMBIN 5000 UNITS EX SOLR
OROMUCOSAL | Status: DC | PRN
  Administered 2022-11-07: 5 mL via TOPICAL

## 2022-11-07 MED ORDER — CHLORHEXIDINE GLUCONATE 0.12 % MT SOLN
15.0000 mL | Freq: Once | OROMUCOSAL | Status: AC
  Administered 2022-11-07: 15 mL via OROMUCOSAL
  Filled 2022-11-07: qty 15

## 2022-11-07 MED ORDER — DOXYLAMINE SUCCINATE (SLEEP) 25 MG PO TABS
25.0000 mg | ORAL_TABLET | Freq: Every evening | ORAL | Status: DC | PRN
  Filled 2022-11-07: qty 1

## 2022-11-07 MED ORDER — OXYCODONE-ACETAMINOPHEN 5-325 MG PO TABS
1.0000 | ORAL_TABLET | ORAL | Status: DC | PRN
  Administered 2022-11-07 – 2022-11-08 (×2): 1 via ORAL
  Filled 2022-11-07 (×2): qty 1

## 2022-11-07 MED ORDER — ROCURONIUM BROMIDE 10 MG/ML (PF) SYRINGE
PREFILLED_SYRINGE | INTRAVENOUS | Status: DC | PRN
  Administered 2022-11-07 (×3): 10 mg via INTRAVENOUS
  Administered 2022-11-07: 60 mg via INTRAVENOUS
  Administered 2022-11-07: 10 mg via INTRAVENOUS

## 2022-11-07 MED ORDER — PROPOFOL 500 MG/50ML IV EMUL
INTRAVENOUS | Status: DC | PRN
  Administered 2022-11-07: 25 ug/kg/min via INTRAVENOUS

## 2022-11-07 MED ORDER — DIVALPROEX SODIUM ER 250 MG PO TB24
250.0000 mg | ORAL_TABLET | Freq: Every day | ORAL | Status: DC
  Administered 2022-11-08: 250 mg via ORAL
  Filled 2022-11-07: qty 1

## 2022-11-07 MED ORDER — PROMETHAZINE HCL 25 MG/ML IJ SOLN: 6.2500 mg | INTRAMUSCULAR | Status: DC | PRN

## 2022-11-07 MED ORDER — PREGABALIN 25 MG PO CAPS
25.0000 mg | ORAL_CAPSULE | Freq: Two times a day (BID) | ORAL | Status: DC
  Administered 2022-11-07 – 2022-11-08 (×2): 25 mg via ORAL
  Filled 2022-11-07 (×2): qty 1

## 2022-11-07 MED ORDER — HYDROCORTISONE 5 MG PO TABS
5.0000 mg | ORAL_TABLET | Freq: Every evening | ORAL | Status: DC
  Administered 2022-11-07: 5 mg via ORAL
  Filled 2022-11-07: qty 1

## 2022-11-07 MED ORDER — ALUM & MAG HYDROXIDE-SIMETH 200-200-20 MG/5ML PO SUSP: 30.0000 mL | Freq: Four times a day (QID) | ORAL | Status: DC | PRN

## 2022-11-07 MED ORDER — 0.9 % SODIUM CHLORIDE (POUR BTL) OPTIME
TOPICAL | Status: DC | PRN
  Administered 2022-11-07 (×2): 1000 mL

## 2022-11-07 MED ORDER — ROCURONIUM BROMIDE 10 MG/ML (PF) SYRINGE
PREFILLED_SYRINGE | INTRAVENOUS | Status: AC
  Filled 2022-11-07: qty 10

## 2022-11-07 MED ORDER — BUPIVACAINE HCL (PF) 0.5 % IJ SOLN
INTRAMUSCULAR | Status: DC | PRN
  Administered 2022-11-07: 20 mL
  Administered 2022-11-07: 5 mL

## 2022-11-07 MED ORDER — LIDOCAINE 2% (20 MG/ML) 5 ML SYRINGE
INTRAMUSCULAR | Status: AC
  Filled 2022-11-07: qty 5

## 2022-11-07 MED ORDER — METHOCARBAMOL 1000 MG/10ML IJ SOLN: 500.0000 mg | Freq: Four times a day (QID) | INTRAVENOUS | Status: DC | PRN

## 2022-11-07 MED ORDER — SODIUM CHLORIDE 0.9% FLUSH: 3.0000 mL | INTRAVENOUS | Status: DC | PRN

## 2022-11-07 MED ORDER — SODIUM CHLORIDE (PF) 0.9 % IJ SOLN
INTRAMUSCULAR | Status: AC
  Filled 2022-11-07: qty 10

## 2022-11-07 MED ORDER — HYDROCORTISONE 5 MG PO TABS: 5.0000 mg | ORAL_TABLET | ORAL | Status: DC

## 2022-11-07 MED ORDER — CEFAZOLIN SODIUM-DEXTROSE 2-4 GM/100ML-% IV SOLN
2.0000 g | Freq: Three times a day (TID) | INTRAVENOUS | Status: AC
  Administered 2022-11-07 (×2): 2 g via INTRAVENOUS
  Filled 2022-11-07 (×2): qty 100

## 2022-11-07 MED ORDER — SUGAMMADEX SODIUM 200 MG/2ML IV SOLN
INTRAVENOUS | Status: DC | PRN
  Administered 2022-11-07: 200 mg via INTRAVENOUS

## 2022-11-07 MED ORDER — DOCUSATE SODIUM 100 MG PO CAPS
100.0000 mg | ORAL_CAPSULE | Freq: Two times a day (BID) | ORAL | Status: DC
  Administered 2022-11-07 – 2022-11-08 (×3): 100 mg via ORAL
  Filled 2022-11-07 (×3): qty 1

## 2022-11-07 MED ORDER — LOSARTAN POTASSIUM-HCTZ 100-12.5 MG PO TABS: 1.0000 | ORAL_TABLET | Freq: Every day | ORAL | Status: DC

## 2022-11-07 MED ORDER — LEVOTHYROXINE SODIUM 100 MCG PO TABS
200.0000 ug | ORAL_TABLET | Freq: Every day | ORAL | Status: DC
  Administered 2022-11-08: 200 ug via ORAL
  Filled 2022-11-07: qty 2

## 2022-11-07 MED ORDER — LACTATED RINGERS IV SOLN: INTRAVENOUS | Status: DC

## 2022-11-07 MED ORDER — MAGNESIUM HYDROXIDE 400 MG/5ML PO SUSP: 30.0000 mL | Freq: Every day | ORAL | Status: DC | PRN

## 2022-11-07 MED ORDER — KETOROLAC TROMETHAMINE 15 MG/ML IJ SOLN
7.5000 mg | Freq: Four times a day (QID) | INTRAMUSCULAR | Status: AC
  Administered 2022-11-07 – 2022-11-08 (×4): 7.5 mg via INTRAVENOUS
  Filled 2022-11-07 (×4): qty 1

## 2022-11-07 MED ORDER — FLEET ENEMA 7-19 GM/118ML RE ENEM: 1.0000 | ENEMA | Freq: Once | RECTAL | Status: DC | PRN

## 2022-11-07 MED ORDER — MENTHOL 3 MG MT LOZG: 1.0000 | LOZENGE | OROMUCOSAL | Status: DC | PRN

## 2022-11-07 MED ORDER — HYDROMORPHONE HCL 1 MG/ML IJ SOLN
0.2500 mg | INTRAMUSCULAR | Status: DC | PRN
  Administered 2022-11-07 (×2): 0.25 mg via INTRAVENOUS

## 2022-11-07 MED ORDER — HYDROXYCHLOROQUINE SULFATE 200 MG PO TABS
200.0000 mg | ORAL_TABLET | Freq: Two times a day (BID) | ORAL | Status: DC
  Administered 2022-11-07 – 2022-11-08 (×2): 200 mg via ORAL
  Filled 2022-11-07 (×3): qty 1

## 2022-11-07 MED ORDER — ONDANSETRON HCL 4 MG/2ML IJ SOLN
INTRAMUSCULAR | Status: AC
  Filled 2022-11-07: qty 2

## 2022-11-07 MED ORDER — SODIUM CHLORIDE 0.9% FLUSH
3.0000 mL | Freq: Two times a day (BID) | INTRAVENOUS | Status: DC
  Administered 2022-11-07: 3 mL via INTRAVENOUS

## 2022-11-07 MED ORDER — CHLORHEXIDINE GLUCONATE CLOTH 2 % EX PADS: 6.0000 | MEDICATED_PAD | Freq: Once | CUTANEOUS | Status: DC

## 2022-11-07 MED ORDER — ACETAMINOPHEN 325 MG PO TABS: 650.0000 mg | ORAL_TABLET | ORAL | Status: DC | PRN

## 2022-11-07 MED ORDER — METHOCARBAMOL 500 MG PO TABS: 500.0000 mg | ORAL_TABLET | Freq: Four times a day (QID) | ORAL | Status: DC | PRN

## 2022-11-07 MED ORDER — PROPOFOL 10 MG/ML IV BOLUS
INTRAVENOUS | Status: AC
  Filled 2022-11-07: qty 20

## 2022-11-07 MED ORDER — HYDROMORPHONE HCL 1 MG/ML IJ SOLN
INTRAMUSCULAR | Status: AC
  Filled 2022-11-07: qty 1

## 2022-11-07 MED ORDER — MORPHINE SULFATE (PF) 2 MG/ML IV SOLN: 2.0000 mg | INTRAVENOUS | Status: DC | PRN

## 2022-11-07 MED ORDER — ONDANSETRON HCL 4 MG PO TABS: 4.0000 mg | ORAL_TABLET | Freq: Four times a day (QID) | ORAL | Status: DC | PRN

## 2022-11-07 MED ORDER — VENLAFAXINE HCL ER 75 MG PO CP24
75.0000 mg | ORAL_CAPSULE | Freq: Every day | ORAL | Status: DC
  Administered 2022-11-08: 75 mg via ORAL
  Filled 2022-11-07: qty 1

## 2022-11-07 MED ORDER — ONDANSETRON HCL 4 MG/2ML IJ SOLN
INTRAMUSCULAR | Status: DC | PRN
  Administered 2022-11-07: 4 mg via INTRAVENOUS

## 2022-11-07 MED ORDER — MYCOPHENOLATE MOFETIL 250 MG PO CAPS
500.0000 mg | ORAL_CAPSULE | Freq: Every evening | ORAL | Status: DC
  Administered 2022-11-07: 500 mg via ORAL
  Filled 2022-11-07: qty 2

## 2022-11-07 MED ORDER — PROBIOTIC 250 MG PO CAPS: ORAL_CAPSULE | Freq: Every day | ORAL | Status: DC

## 2022-11-07 MED ORDER — HYDROCHLOROTHIAZIDE 12.5 MG PO TABS
12.5000 mg | ORAL_TABLET | Freq: Every day | ORAL | Status: DC
  Administered 2022-11-08: 12.5 mg via ORAL
  Filled 2022-11-07: qty 1

## 2022-11-07 MED ORDER — MIDAZOLAM HCL 2 MG/2ML IJ SOLN
INTRAMUSCULAR | Status: DC | PRN
  Administered 2022-11-07: 2 mg via INTRAVENOUS

## 2022-11-07 MED ORDER — SUFENTANIL CITRATE 50 MCG/ML IV SOLN
INTRAVENOUS | Status: DC | PRN
  Administered 2022-11-07: 15 ug via INTRAVENOUS
  Administered 2022-11-07: 10 ug via INTRAVENOUS

## 2022-11-07 MED ORDER — DEXAMETHASONE SODIUM PHOSPHATE 10 MG/ML IJ SOLN
INTRAMUSCULAR | Status: AC
  Filled 2022-11-07: qty 1

## 2022-11-07 MED ORDER — LOSARTAN POTASSIUM 50 MG PO TABS
100.0000 mg | ORAL_TABLET | Freq: Every day | ORAL | Status: DC
  Administered 2022-11-08: 100 mg via ORAL
  Filled 2022-11-07: qty 2

## 2022-11-07 MED ORDER — HYDROCORTISONE 5 MG PO TABS
15.0000 mg | ORAL_TABLET | Freq: Every morning | ORAL | Status: DC
  Filled 2022-11-07: qty 1

## 2022-11-07 MED ORDER — AMISULPRIDE (ANTIEMETIC) 5 MG/2ML IV SOLN: 10.0000 mg | Freq: Once | INTRAVENOUS | Status: DC | PRN

## 2022-11-07 MED ORDER — ACETAMINOPHEN 500 MG PO TABS
1000.0000 mg | ORAL_TABLET | Freq: Three times a day (TID) | ORAL | Status: DC
  Administered 2022-11-07: 1000 mg via ORAL
  Filled 2022-11-07: qty 2

## 2022-11-07 MED ORDER — MYCOPHENOLATE MOFETIL 250 MG PO CAPS
1000.0000 mg | ORAL_CAPSULE | Freq: Every morning | ORAL | Status: DC
  Filled 2022-11-07: qty 4

## 2022-11-07 MED ORDER — MYCOPHENOLATE MOFETIL 500 MG PO TABS: 500.0000 mg | ORAL_TABLET | ORAL | Status: DC

## 2022-11-07 MED ORDER — DEXAMETHASONE SODIUM PHOSPHATE 10 MG/ML IJ SOLN
INTRAMUSCULAR | Status: DC | PRN
  Administered 2022-11-07: 5 mg via INTRAVENOUS

## 2022-11-07 MED ORDER — BISACODYL 10 MG RE SUPP: 10.0000 mg | Freq: Every day | RECTAL | Status: DC | PRN

## 2022-11-07 MED ORDER — LIDOCAINE 2% (20 MG/ML) 5 ML SYRINGE
INTRAMUSCULAR | Status: DC | PRN
  Administered 2022-11-07: 75 mg via INTRAVENOUS

## 2022-11-07 MED ORDER — CEFAZOLIN SODIUM-DEXTROSE 2-4 GM/100ML-% IV SOLN
2.0000 g | INTRAVENOUS | Status: AC
  Administered 2022-11-07: 2 g via INTRAVENOUS
  Filled 2022-11-07: qty 100

## 2022-11-07 MED ORDER — MELATONIN 10 MG PO TABS: 10.0000 mg | ORAL_TABLET | Freq: Every day | ORAL | Status: DC

## 2022-11-07 MED ORDER — LIDOCAINE-EPINEPHRINE 1 %-1:100000 IJ SOLN
INTRAMUSCULAR | Status: DC | PRN
  Administered 2022-11-07: 5 mL

## 2022-11-07 MED ORDER — BUPIVACAINE HCL (PF) 0.5 % IJ SOLN
INTRAMUSCULAR | Status: AC
  Filled 2022-11-07: qty 30

## 2022-11-07 MED ORDER — ACETAMINOPHEN 500 MG PO TABS: 1000.0000 mg | ORAL_TABLET | Freq: Once | ORAL | Status: DC

## 2022-11-07 MED ORDER — SUFENTANIL CITRATE 50 MCG/ML IV SOLN
INTRAVENOUS | Status: AC
  Filled 2022-11-07: qty 1

## 2022-11-07 MED ORDER — ACETAMINOPHEN 650 MG RE SUPP: 650.0000 mg | RECTAL | Status: DC | PRN

## 2022-11-07 MED ORDER — SODIUM CHLORIDE 0.9 % IV SOLN: 250.0000 mL | INTRAVENOUS | Status: DC

## 2022-11-07 MED ORDER — POLYETHYLENE GLYCOL 3350 17 G PO PACK: 17.0000 g | PACK | Freq: Every day | ORAL | Status: DC | PRN

## 2022-11-07 MED ORDER — PHENOL 1.4 % MT LIQD: 1.0000 | OROMUCOSAL | Status: DC | PRN

## 2022-11-07 SURGICAL SUPPLY — 72 items
ADH SKN CLS APL DERMABOND .7 (GAUZE/BANDAGES/DRESSINGS) ×1
APL SRG 60D 8 XTD TIP BNDBL (TIP)
BAG COUNTER SPONGE SURGICOUNT (BAG) ×2 IMPLANT
BAG SPNG CNTER NS LX DISP (BAG) ×2
BASKET BONE COLLECTION (BASKET) ×2 IMPLANT
BLADE CLIPPER SURG (BLADE) IMPLANT
BONE CANC CHIPS 20CC PCAN1/4 (Bone Implant) ×1 IMPLANT
BUR MATCHSTICK NEURO 3.0 LAGG (BURR) ×2 IMPLANT
CANISTER SUCT 3000ML PPV (MISCELLANEOUS) ×2 IMPLANT
CEMENT KYPHON C01A KIT/MIXER (Cement) IMPLANT
CHIPS CANC BONE 20CC PCAN1/4 (Bone Implant) ×1 IMPLANT
CNTNR URN SCR LID CUP LEK RST (MISCELLANEOUS) ×2 IMPLANT
CONT SPEC 4OZ STRL OR WHT (MISCELLANEOUS) ×1
COVER BACK TABLE 60X90IN (DRAPES) ×2 IMPLANT
DERMABOND ADVANCED .7 DNX12 (GAUZE/BANDAGES/DRESSINGS) ×2 IMPLANT
DEVICE DISSECT PLASMABLAD 3.0S (MISCELLANEOUS) ×2 IMPLANT
DRAPE C-ARM 42X72 X-RAY (DRAPES) ×4 IMPLANT
DRAPE HALF SHEET 40X57 (DRAPES) IMPLANT
DRAPE LAPAROTOMY 100X72X124 (DRAPES) ×2 IMPLANT
DRAPE WARM FLUID 44X44 (DRAPES) IMPLANT
DRSG OPSITE POSTOP 4X8 (GAUZE/BANDAGES/DRESSINGS) IMPLANT
DURAPREP 26ML APPLICATOR (WOUND CARE) ×2 IMPLANT
DURASEAL APPLICATOR TIP (TIP) IMPLANT
DURASEAL SPINE SEALANT 3ML (MISCELLANEOUS) IMPLANT
ELECT REM PT RETURN 9FT ADLT (ELECTROSURGICAL) ×1
ELECTRODE REM PT RTRN 9FT ADLT (ELECTROSURGICAL) ×2 IMPLANT
GAUZE 4X4 16PLY ~~LOC~~+RFID DBL (SPONGE) IMPLANT
GAUZE SPONGE 4X4 12PLY STRL (GAUZE/BANDAGES/DRESSINGS) ×2 IMPLANT
GLOVE BIOGEL PI IND STRL 8.5 (GLOVE) ×4 IMPLANT
GLOVE ECLIPSE 8.5 STRL (GLOVE) ×4 IMPLANT
GLOVE SURG SS PI 6.5 STRL IVOR (GLOVE) IMPLANT
GLOVE SURG SS PI 8.5 STRL STRW (GLOVE) IMPLANT
GOWN STRL REUS W/ TWL LRG LVL3 (GOWN DISPOSABLE) IMPLANT
GOWN STRL REUS W/ TWL XL LVL3 (GOWN DISPOSABLE) IMPLANT
GOWN STRL REUS W/TWL 2XL LVL3 (GOWN DISPOSABLE) ×4 IMPLANT
GOWN STRL REUS W/TWL LRG LVL3 (GOWN DISPOSABLE)
GOWN STRL REUS W/TWL XL LVL3 (GOWN DISPOSABLE)
GRAFT BNE CANC CHIPS 1-8 20CC (Bone Implant) IMPLANT
GRAFT BONE PROTEIOS LRG 5CC (Orthopedic Implant) IMPLANT
HEMOSTAT POWDER KIT SURGIFOAM (HEMOSTASIS) IMPLANT
KIT BASIN OR (CUSTOM PROCEDURE TRAY) ×2 IMPLANT
KIT TURNOVER KIT B (KITS) ×2 IMPLANT
MILL BONE PREP (MISCELLANEOUS) ×2 IMPLANT
NDL RELINE-OR FENS 16 (NEEDLE) IMPLANT
NEEDLE HYPO 22GX1.5 SAFETY (NEEDLE) ×2 IMPLANT
NEEDLE RELINE-OR FENS 16 (NEEDLE) ×2 IMPLANT
NS IRRIG 1000ML POUR BTL (IV SOLUTION) ×2 IMPLANT
PACK LAMINECTOMY NEURO (CUSTOM PROCEDURE TRAY) ×2 IMPLANT
PAD ARMBOARD 7.5X6 YLW CONV (MISCELLANEOUS) ×6 IMPLANT
PATTIES SURGICAL .5 X1 (DISPOSABLE) ×2 IMPLANT
PLASMABLADE 3.0S (MISCELLANEOUS) ×1
PUSHER RELINE FENS 36 OR (MISCELLANEOUS) IMPLANT
ROD RELINE 0-0 CON M 5.0/6.0MM (Rod) IMPLANT
ROD RELINE TI LATERAL MED OFF (Rod) IMPLANT
ROD TEMPLATE RELINE SL (MISCELLANEOUS) IMPLANT
SCREW LOCK RELINE 5.5 TULIP (Screw) IMPLANT
SCREW RELINE PA 2FC 6.5X50 (Screw) IMPLANT
SPIKE FLUID TRANSFER (MISCELLANEOUS) ×2 IMPLANT
SPONGE SURGIFOAM ABS GEL 100 (HEMOSTASIS) ×2 IMPLANT
SPONGE T-LAP 4X18 ~~LOC~~+RFID (SPONGE) IMPLANT
SUT PROLENE 6 0 BV (SUTURE) IMPLANT
SUT VIC AB 1 CT1 18XBRD ANBCTR (SUTURE) ×2 IMPLANT
SUT VIC AB 1 CT1 8-18 (SUTURE) ×1
SUT VIC AB 2-0 CP2 18 (SUTURE) ×2 IMPLANT
SUT VIC AB 3-0 SH 8-18 (SUTURE) ×2 IMPLANT
SUT VIC AB 4-0 RB1 18 (SUTURE) ×2 IMPLANT
SYR 3ML LL SCALE MARK (SYRINGE) ×8 IMPLANT
TIP FENS REPLACE RELINE (MISCELLANEOUS) IMPLANT
TOWEL GREEN STERILE (TOWEL DISPOSABLE) ×2 IMPLANT
TOWEL GREEN STERILE FF (TOWEL DISPOSABLE) ×2 IMPLANT
TRAY FOLEY MTR SLVR 16FR STAT (SET/KITS/TRAYS/PACK) ×2 IMPLANT
WATER STERILE IRR 1000ML POUR (IV SOLUTION) ×2 IMPLANT

## 2022-11-07 NOTE — Anesthesia Postprocedure Evaluation (Signed)
Anesthesia Post Note  Patient: Crystal Larsen  Procedure(s) Performed: Lumbar one -two Revision of pseudoarthrosis with pedicle fixation and posterolareral arthrodesis, augmented pedicle screws (Back)     Patient location during evaluation: PACU Anesthesia Type: General Level of consciousness: sedated and patient cooperative Pain management: pain level controlled Vital Signs Assessment: post-procedure vital signs reviewed and stable Respiratory status: spontaneous breathing Cardiovascular status: stable Anesthetic complications: yes   Encounter Notable Events  Notable Event Outcome Phase Comment  Difficult to intubate - unexpected  Intraprocedure Filed from anesthesia note documentation.    Last Vitals:  Vitals:   11/07/22 1200 11/07/22 1215  BP: (!) 148/117 139/82  Pulse: 73 72  Resp: 15 12  Temp:    SpO2: 99% 98%    Last Pain:  Vitals:   11/07/22 1215  TempSrc:   PainSc: [name]

## 2022-11-07 NOTE — Progress Notes (Signed)
Orthopedic Tech Progress Note Patient Details:  Crystal Larsen 1956/02/01 [id number]  Ortho Devices Type of Ortho Device: Lumbar corsett Ortho Device/Splint Interventions: Ordered      Danton Sewer [name] 11/07/2022, 1:08 PM

## 2022-11-07 NOTE — Anesthesia Procedure Notes (Addendum)
Procedure Name: Intubation Date/Time: 11/07/2022 8:12 AM  Performed by: Moshe Salisbury, CRNAPre-anesthesia Checklist: Patient identified, Emergency Drugs available, Suction available and Patient being monitored Patient Re-evaluated:Patient Re-evaluated prior to induction Oxygen Delivery Method: Circle System Utilized Preoxygenation: Pre-oxygenation with 100% oxygen Induction Type: IV induction Ventilation: Mask ventilation without difficulty Laryngoscope Size: Mac and 3 Grade View: Grade III Tube type: Oral Tube size: 7.5 mm Number of attempts: 2 Airway Equipment and Method: Stylet and Bougie stylet (Grade III view with Mac 3, esophageal intubation immediately recognised and ETT out, Mask vent, Grade III view, bougie stylet through cords and ETT over bougie.) Placement Confirmation: ETT inserted through vocal cords under direct vision, positive ETCO2 and breath sounds checked- equal and bilateral Secured at: 21 cm Tube secured with: Tape Dental Injury: Teeth and Oropharynx as per pre-operative assessment  Difficulty Due To: Difficult Airway- due to reduced neck mobility, Difficult Airway- due to anterior larynx and Difficulty was unanticipated

## 2022-11-07 NOTE — OR Nursing (Signed)
Kyphon hv-r bone cement used at surgical site by Dr Ellene Route

## 2022-11-07 NOTE — Op Note (Signed)
Date of surgery: 11/07/2022 Preoperative diagnosis: Spondylosis and pseudoarthrosis with stenosis L1-L2 lumbar radiculopathy. Postoperative diagnosis: Same Seizure: Posterior stabilization L1-L2 with augmented pedicle fixation of L1 to previous fusion L2-S1.  Decompression of spinal canal L1-L2 via bilateral laminotomies and foraminotomies.  Posterolateral arthrodesis with local autograft and allograft and Proteios L1-L2. Surgeon: Kristeen Miss First Assistant: Elwin Sleight, DO Anesthesia: General endotracheal Indications: The patient is a 66 year old individual has had a previous decompression and fusion posteriorly from L2 to the sacrum.  This had been done in stages with L4-5 being done in 1 stage and L2-L4 being done in the second stage.  She then developed junctional syndrome at L1-L2 and underwent an anterolateral decompression and fusion with a lateral plate fixation.  She is had progressive worsening back pain and leg pain and is found to have a pseudoarthrosis at L1-L2 with recurrent stenosis at the L1-L2 level.  He has been advised regarding surgical decompression and stabilization using a posterior approach.  Procedure patient was brought to the operating room plan on the stretcher.  After the smooth induction of general endotracheal anesthesia she was carefully turned prone with the bony prominences being padded and protected.  The back was prepped with alcohol DuraPrep and draped in a sterile fashion.  An incision was made over the superior portion of the hardware extending up beyond the level of L1.  The dissection was carried down to the lumbodorsal fascia the fascia was opened on either side of midline to expose the previous hardware and L2.  The dissection was then carried cephalad to expose the interlaminar space at L1-L2 and the transverse process of L1 including the inferior margin of the facet at T12-L1.  Care was taken to protect the facet joint itself.  Then the inferior hardware was  exposed and there was noted to be significant bone growing up around the hardware the interval and the rods between L1 and L2 were then cleared it was felt that a W connector placed on these rods would serve as an attachment point for the fixation to the L1 vertebrae.  W connectors were then placed loosely in this area after clearing the soft tissue and bony overgrowth from around the rods.  Laminotomies were then created removing the inferior margin lamina of L1-L2 including a portion of the facet joint.  A facetectomy was performed medially to decompress the central canal and lateral recesses.  Good decompression was achieved on the left and the right sides identifying the path of the L1 nerve root superiorly and the L2 nerve root inferiorly once the decompression was secured pedicle entry sites were chosen at L1 using fluoroscopic guidance because of the patient's previous pseudoarthrosis and the concern for osteopenia and osteoporosis is decided to augment the pedicle screws and L1 L1 pedicle screws were placed using fluoroscopic guidance these were 6.5 x 50 mm screws then 1 cc of methacrylate was injected through the fenestrated screws providing an augmented fixation.  0 rods were then used to connect the W connector to the pedicle screws at L1 in the neutral construct.  These were placed tentatively at first and prior to this lateral gutters which had been previously decorticated between the intertransverse space of L1 and L2 were packed with approximately 9 cc of bone in each lateral gutter.  Once all the bone was packed the system was tightened in a neutral construct and final radiographs were obtained in the AP and lateral projection care was taken to inspect the common dural  tube and L1 and L2 nerve roots to make sure they are adequately decompressed when this was verified the retractors were removed lumbodorsal fascia was reapproximated with #1 Vicryl interrupted fashion 2-0 Vicryl was used in the  subcutaneous tissues and 3-0 Vicryl subcuticularly along with some 4-0 Vicryl for the final subcuticular closure.  Blood loss is estimated about 150 cc 20 cc of half percent Marcaine was injected into the paraspinous fascia at the time of closure.  No Cell Saver blood was returned to the patient.  Patient tolerated procedure well was returned to recovery room in stable condition.

## 2022-11-07 NOTE — Transfer of Care (Signed)
Immediate Anesthesia Transfer of Care Note  Patient: Crystal Larsen  Procedure(s) Performed: Lumbar one -two Revision of pseudoarthrosis with pedicle fixation and posterolareral arthrodesis, augmented pedicle screws (Back)  Patient Location: PACU  Anesthesia Type:General  Level of Consciousness: drowsy, patient cooperative, and responds to stimulation  Airway & Oxygen Therapy: Patient Spontanous Breathing  Post-op Assessment: Report given to RN and Post -op Vital signs reviewed and stable  Post vital signs: Reviewed and stable  Last Vitals:  Vitals Value Taken Time  BP 154/81 11/07/22 1129  Temp    Pulse 88 11/07/22 1130  Resp 16 11/07/22 1130  SpO2 96 % 11/07/22 1130  Vitals shown include unvalidated device data.  Last Pain:  Vitals:   11/07/22 6063  TempSrc: Oral  PainSc:       Patients Stated Pain Goal: 1 (01/60/10 9323)  Complications:  Encounter Notable Events  Notable Event Outcome Phase Comment  Difficult to intubate - unexpected  Intraprocedure Filed from anesthesia note documentation.

## 2022-11-07 NOTE — H&P (Addendum)
Crystal Larsen is an 66 y.o. female.   Chief Complaint: Increasing back pain HPI: Crystal Larsen is a 66 year old individual whose had previous decompression and fusion of the lumbar spine.  This initially included L4 to the sacrum then L2-3 and L3-4 for added developed degenerative changes there and ultimately had adjacent level disease at L1-L2 about 2 years ago underwent an XLIF procedure with lateral fixation at L1-L2.  She has evolved a pseudoarthrosis at this level and has had increasing pain and neurogenic claudication and radicular symptoms with progressive stenosis at the L1-L2 level.  Recent evaluation demonstrated the findings on the CT scan and she was advised regarding the need for revision surgery with posterior decompression and stabilization using a posterior technique she is now admitted for that procedure.  Past Medical History:  Diagnosis Date   Acute kidney injury ([place])    Lupus nephritis,CKD stg 3   Adrenal insufficiency ([facility])    Anemia    Anxiety    Arthritis    "left foot; left hand; right shoulder; back" (11/04/2014)   Elevated liver enzymes    Gallstones    GERD (gastroesophageal reflux disease)    History of gout    HTN (hypertension)    Hypothyroidism    IBS (irritable bowel syndrome)    ?   Lupus ([place]) 56/8127   Metabolic acidosis    Migraine    "maybe once/yr" (11/04/2014)   Neuropathy    right leg   Obesity    Pancreatitis    Pancreatitis    PONV (postoperative nausea and vomiting) 1981 X 1   only time   Seizures ([place]) 2003    due to tramadol reaction -- takes depakote for migraine prevention   Sleep apnea    does not use cpap   Spondylolisthesis of lumbar region    Unsteady gait    Wears glasses     Past Surgical History:  Procedure Laterality Date   ANKLE GANGLION CYST EXCISION Right 1980's   ANTERIOR LAT LUMBAR FUSION N/A 12/09/2019   Procedure: Lumbar One-Two Anterolateral lumbar decompression/interbody fusion with lateral plate;   Surgeon: Kristeen Miss, MD;  Location: [facility];  Service: Neurosurgery;  Laterality: N/A;  Lumbar One-Two Anterolateral lumbar decompression/interbody fusion with lateral plate   BACK SURGERY     BREAST SURGERY     biopsy   BUNIONECTOMY Left 2012   CARPAL TUNNEL RELEASE Right 05/22/2019   Procedure: RIGHT CARPAL TUNNEL RELEASE;  Surgeon: Daryll Brod, MD;  Location: [facility];  Service: Orthopedics;  Laterality: Right;   CARPOMETACARPEL SUSPENSION PLASTY Right 05/22/2019   Procedure: ARTHROPLASY CARPOMETACARPAL RIGHT THUMB TRAPEZIUM EXCISION TENDON TRANSFER MICRO LINK SUSPENSION;  Surgeon: Daryll Brod, MD;  Location: [facility];  Service: Orthopedics;  Laterality: Right;   CESAREAN SECTION  1986   COLONOSCOPY W/ BIOPSIES AND POLYPECTOMY     EYE SURGERY Bilateral 2012   "lasered a hole in my iris cause pressure was building up"   LAPAROSCOPIC CHOLECYSTECTOMY  1990's   NASAL SINUS SURGERY  1980's   POSTERIOR LUMBAR FUSION  05/2013   L4-5; S1   POSTERIOR LUMBAR FUSION  11/04/2014   L3-4   SHOULDER ARTHROSCOPY W/ ROTATOR CUFF REPAIR Right 2012   TUBAL LIGATION  1992    Family History  Problem Relation Age of Onset   Lung cancer Mother    Heart disease Father    Heart disease Maternal Grandfather        PGF   Alcohol  abuse Maternal Grandfather    Hyperlipidemia Maternal Grandfather    Anxiety disorder Sister    Fibromyalgia Sister    Nephrolithiasis Daughter    Social History:  reports that she has never smoked. She has never used smokeless tobacco. She reports current alcohol use of about 1.0 standard drink of alcohol per week. She reports that she does not use drugs.  Allergies:  Allergies  Allergen Reactions   Ultram [Tramadol] Other (See Comments)    seizures   Hydrocodone-Acetaminophen Itching   Adhesive [Tape] Rash    bandaide   Erythromycin Rash   Latex Rash   Neurontin [Gabapentin] Itching   Sulfonamide Derivatives Rash    Medications  Prior to Admission  Medication Sig Dispense Refill   acetaminophen (TYLENOL) 500 MG tablet Take 1,000 mg by mouth 3 (three) times daily.     Alpha-Lipoic Acid (LIPOIC ACID PO) Take 1 capsule by mouth daily.     aspirin EC 81 MG tablet Take 81 mg by mouth at bedtime.     Cholecalciferol (VITAMIN D) 50 MCG (2000 UT) tablet Take 2,000 Units by mouth daily.     desvenlafaxine (PRISTIQ) 50 MG 24 hr tablet TAKE 1 TABLET BY MOUTH DAILY 90 tablet 3   divalproex (DEPAKOTE ER) 250 MG 24 hr tablet Take 1 tablet (250 mg total) by mouth daily. 90 tablet 2   doxylamine, Sleep, (UNISOM) 25 MG tablet Take 1 tablet (25 mg total) by mouth at bedtime as needed. 30 tablet 0   hydrocortisone (CORTEF) 10 MG tablet Take 5-10 mg by mouth See admin instructions. Take 15 mg by mouth in the morning and 5 mg in the afternoon.     hydroxychloroquine (PLAQUENIL) 200 MG tablet Take 200 mg by mouth 2 (two) times daily.     levocetirizine (XYZAL) 5 MG tablet Take 5 mg by mouth at bedtime.     levothyroxine (SYNTHROID) 200 MCG tablet Take 200 mcg by mouth daily before breakfast.     losartan-hydrochlorothiazide (HYZAAR) 100-12.5 MG tablet Take 1 tablet by mouth daily.     Melatonin 10 MG TABS Take 10 mg by mouth at bedtime.     Multiple Vitamin (MULTI-VITAMIN DAILY PO) Take 1 tablet by mouth daily.     mycophenolate (CELLCEPT) 500 MG tablet Take 500-1,000 mg by mouth See admin instructions. Take 1000 mg by mouth in the morning and 500 mg in the evening     niacin (NIASPAN) 500 MG CR tablet Take 500 mg by mouth at bedtime.     Omega-3 Fatty Acids (OMEGA 3 PO) Take 1 capsule by mouth at bedtime.     oxymetazoline (AFRIN) 0.05 % nasal spray Place 1 spray into both nostrils 2 (two) times daily as needed for congestion.     Probiotic Product (PROBIOTIC PO) Take 1 capsule by mouth daily.      Belimumab (BENLYSTA IV) 1 Dose by Intravenous (Continuous Infusion) route every 30 (thirty) days.     dicyclomine (BENTYL) 20 MG tablet Take 20  mg by mouth daily as needed (abdominal pain).      lidocaine-prilocaine (EMLA) cream Apply 1 application topically as needed. (Patient not taking: Reported on 10/30/2022) 30 g 2   pregabalin (LYRICA) 25 MG capsule Take 1 capsule (25 mg total) by mouth 2 (two) times daily. (Patient not taking: Reported on 10/30/2022) 60 capsule 2    No results found for this or any previous visit (from the past 48 hour(s)). No results found.  Review of Systems  Constitutional:  Positive for activity change.  Musculoskeletal:  Positive for back pain, gait problem and myalgias.  Neurological:  Positive for weakness.  All other systems reviewed and are negative.   Blood pressure (!) 157/92, pulse 65, temperature 98 F (36.7 C), temperature source Oral, resp. rate 16, height '5\' 6"'$  (1.676 m), weight 77.1 kg, SpO2 97 %. Physical Exam Constitutional:      Appearance: Normal appearance. She is normal weight.  HENT:     Head: Normocephalic and atraumatic.     Right Ear: Tympanic membrane, ear canal and external ear normal.     Left Ear: Tympanic membrane, ear canal and external ear normal.     Nose: Nose normal.     Mouth/Throat:     Mouth: Mucous membranes are moist.     Pharynx: Oropharynx is clear.  Eyes:     Extraocular Movements: Extraocular movements intact.     Conjunctiva/sclera: Conjunctivae normal.     Pupils: Pupils are equal, round, and reactive to light.  Cardiovascular:     Rate and Rhythm: Normal rate and regular rhythm.     Pulses: Normal pulses.     Heart sounds: Normal heart sounds.  Pulmonary:     Effort: Pulmonary effort is normal.     Breath sounds: Normal breath sounds.  Abdominal:     General: Abdomen is flat. Bowel sounds are normal.     Palpations: Abdomen is soft.  Musculoskeletal:     Cervical back: Normal range of motion.     Comments: Positive straight leg raising bilaterally at 50 degrees Patrick's maneuver is negative bilaterally.  Lower extremity strength reveals mild  proximal weakness in the iliopsoas bilaterally at 4 out of 5.  Distal strength is intact deep tendon reflexes are absent in the patellae and Achilles.  Skin:    General: Skin is warm and dry.     Capillary Refill: Capillary refill takes less than 2 seconds.  Neurological:     General: No focal deficit present.     Mental Status: She is alert.  Psychiatric:        Mood and Affect: Mood normal.        Behavior: Behavior normal.        Thought Content: Thought content normal.        Judgment: Judgment normal.      Assessment/Plan Lumbar spondylosis and stenosis history of fusion from L2 to the sacrum pseudoarthrosis at the L1-L2 level from previous XLIF procedure.  Plan: Decompression and fusion L1-L2 via posterior technique.  Earleen Newport, MD 11/07/2022, 7:51 AM

## 2022-11-08 DIAGNOSIS — M4726 Other spondylosis with radiculopathy, lumbar region: Secondary | ICD-10-CM | POA: Diagnosis not present

## 2022-11-08 MED ORDER — DEXAMETHASONE 4 MG PO TABS
2.0000 mg | ORAL_TABLET | Freq: Once | ORAL | Status: AC
  Administered 2022-11-08: 2 mg via ORAL
  Filled 2022-11-08: qty 1

## 2022-11-08 MED ORDER — HYDROCORTISONE 5 MG PO TABS
15.0000 mg | ORAL_TABLET | Freq: Every morning | ORAL | Status: DC
  Administered 2022-11-08: 15 mg via ORAL
  Filled 2022-11-08: qty 1

## 2022-11-08 MED ORDER — OXYCODONE-ACETAMINOPHEN 5-325 MG PO TABS: 1.0000 | ORAL_TABLET | ORAL | 0 refills | Status: DC | PRN

## 2022-11-08 MED ORDER — METHOCARBAMOL 500 MG PO TABS: 500.0000 mg | ORAL_TABLET | Freq: Four times a day (QID) | ORAL | 1 refills | Status: AC | PRN

## 2022-11-08 MED ORDER — HYDROCORTISONE 5 MG PO TABS
5.0000 mg | ORAL_TABLET | Freq: Every evening | ORAL | Status: DC
  Filled 2022-11-08: qty 1

## 2022-11-08 MED ORDER — MYCOPHENOLATE MOFETIL 250 MG PO CAPS
1000.0000 mg | ORAL_CAPSULE | Freq: Every morning | ORAL | Status: DC
  Administered 2022-11-08: 1000 mg via ORAL
  Filled 2022-11-08: qty 4

## 2022-11-08 MED ORDER — MYCOPHENOLATE MOFETIL 250 MG PO CAPS
500.0000 mg | ORAL_CAPSULE | Freq: Every evening | ORAL | Status: DC
  Filled 2022-11-08: qty 2

## 2022-11-08 NOTE — Discharge Summary (Signed)
Physician Discharge Summary  Patient ID: Crystal Larsen MRN: [id number] DOB/AGE: 67/04/1956 67 y.o.  Admit date: 11/07/2022 Discharge date: 11/08/2022  Admission Diagnoses: Pseudoarthrosis L1-L2 with radiculopathy and spinal stenosis  Discharge Diagnoses: Pseudoarthrosis L1-L2.  Lumbar radiculopathy.  Lumbar spinal stenosis with neurogenic claudication. Principal Problem:   Pseudoarthrosis of lumbar spine   Discharged Condition: good  Hospital Course: Patient was admitted to undergo surgical decompression and stabilization of L1-L2 which he tolerated well.  Consults: None  Significant Diagnostic Studies: None  Treatments: surgery: See op note  Discharge Exam: Blood pressure 127/84, pulse 74, temperature 99.2 F (37.3 C), temperature source Oral, resp. rate 16, height '5\' 6"'$  (1.676 m), weight 77.1 kg, SpO2 100 %. Incision is clean and dry Station and gait are intact.  Disposition: Discharge disposition: 01-Home or Self Care       Discharge Instructions     Call MD for:  redness, tenderness, or signs of infection (pain, swelling, redness, odor or green/yellow discharge around incision site)   Complete by: As directed    Call MD for:  severe uncontrolled pain   Complete by: As directed    Call MD for:  temperature >100.4   Complete by: As directed    Diet - low sodium heart healthy   Complete by: As directed    Discharge wound care:   Complete by: As directed    Okay to shower. Do not apply salves or appointments to incision. No heavy lifting with the upper extremities greater than 10  pounds. May resume driving when not requiring pain medication and patient feels comfortable with doing so.   Incentive spirometry RT   Complete by: As directed    Increase activity slowly   Complete by: As directed       Allergies as of 11/08/2022       Reactions   Ultram [tramadol] Other (See Comments)   seizures   Hydrocodone-acetaminophen Itching   Adhesive [tape] Rash    bandaide   Erythromycin Rash   Latex Rash   Neurontin [gabapentin] Itching   Sulfonamide Derivatives Rash        Medication List     TAKE these medications    acetaminophen 500 MG tablet Commonly known as: TYLENOL Take 1,000 mg by mouth 3 (three) times daily.   aspirin EC 81 MG tablet Take 81 mg by mouth at bedtime.   BENLYSTA IV 1 Dose by Intravenous (Continuous Infusion) route every 30 (thirty) days.   desvenlafaxine 50 MG 24 hr tablet Commonly known as: PRISTIQ TAKE 1 TABLET BY MOUTH DAILY   dicyclomine 20 MG tablet Commonly known as: BENTYL Take 20 mg by mouth daily as needed (abdominal pain).   divalproex 250 MG 24 hr tablet Commonly known as: DEPAKOTE ER Take 1 tablet (250 mg total) by mouth daily.   doxylamine (Sleep) 25 MG tablet Commonly known as: UNISOM Take 1 tablet (25 mg total) by mouth at bedtime as needed.   hydrocortisone 10 MG tablet Commonly known as: CORTEF Take 5-10 mg by mouth See admin instructions. Take 15 mg by mouth in the morning and 5 mg in the afternoon.   hydroxychloroquine 200 MG tablet Commonly known as: PLAQUENIL Take 200 mg by mouth 2 (two) times daily.   levocetirizine 5 MG tablet Commonly known as: XYZAL Take 5 mg by mouth at bedtime.   levothyroxine 200 MCG tablet Commonly known as: SYNTHROID Take 200 mcg by mouth daily before breakfast.   lidocaine-prilocaine cream Commonly known as: EMLA  Apply 1 application topically as needed.   LIPOIC ACID PO Take 1 capsule by mouth daily.   losartan-hydrochlorothiazide 100-12.5 MG tablet Commonly known as: HYZAAR Take 1 tablet by mouth daily.   Melatonin 10 MG Tabs Take 10 mg by mouth at bedtime.   methocarbamol 500 MG tablet Commonly known as: ROBAXIN Take 1 tablet (500 mg total) by mouth every 6 (six) hours as needed for muscle spasms.   MULTI-VITAMIN DAILY PO Take 1 tablet by mouth daily.   mycophenolate 500 MG tablet Commonly known as: CELLCEPT Take 500-1,000  mg by mouth See admin instructions. Take 1000 mg by mouth in the morning and 500 mg in the evening   niacin 500 MG ER tablet Commonly known as: VITAMIN B3 Take 500 mg by mouth at bedtime.   OMEGA 3 PO Take 1 capsule by mouth at bedtime.   oxyCODONE-acetaminophen 5-325 MG tablet Commonly known as: PERCOCET/ROXICET Take 1-2 tablets by mouth every 4 (four) hours as needed for moderate pain or severe pain.   oxymetazoline 0.05 % nasal spray Commonly known as: AFRIN Place 1 spray into both nostrils 2 (two) times daily as needed for congestion.   pregabalin 25 MG capsule Commonly known as: Lyrica Take 1 capsule (25 mg total) by mouth 2 (two) times daily.   PROBIOTIC PO Take 1 capsule by mouth daily.   Vitamin D 50 MCG (2000 UT) tablet Take 2,000 Units by mouth daily.               Discharge Care Instructions  (From admission, onward)           Start     Ordered   11/08/22 0000  Discharge wound care:       Comments: Okay to shower. Do not apply salves or appointments to incision. No heavy lifting with the upper extremities greater than 10  pounds. May resume driving when not requiring pain medication and patient feels comfortable with doing so.   11/08/22 0914             Signed: Blanchie Dessert Donny Heffern 11/08/2022, 9:14 AM

## 2022-11-08 NOTE — Evaluation (Signed)
Physical Therapy Evaluation Patient Details Name: Crystal Larsen MRN: [id number] DOB: Jan 21, 1956 Today's Date: 11/08/2022  History of Present Illness  atient is a 66 yo female presenting to the hospital with spondylosis and pseudoarthrosis with stenosis L1-L2 lumbar radiculopathy. Patient undergoing L1-2 revision with spinal fusion on 11/07/22. PMH includes: lupus, Hashimoto's and adrenal insufficiency.   Clinical Impression  PT eval complete. Pt demonstrated mod I bed mobility and transfers. Supervision amb 200' with cane, and min guard assist ascend/descend 3 steps with rail and cane. All education complete. No further skilled PT intervention indicated.        Recommendations for follow up therapy are one component of a multi-disciplinary discharge planning process, led by the attending physician.  Recommendations may be updated based on patient status, additional functional criteria and insurance authorization.  Follow Up Recommendations No PT follow up      Assistance Recommended at Discharge PRN  Patient can return home with the following  Assistance with cooking/housework;Assist for transportation;Help with stairs or ramp for entrance    Equipment Recommendations None recommended by PT  Recommendations for Other Services       Functional Status Assessment Patient has had a recent decline in their functional status and demonstrates the ability to make significant improvements in function in a reasonable and predictable amount of time.     Precautions / Restrictions Precautions Precautions: Back Precaution Comments: Reviewed 3/3 back precautions. Handout provided. Required Braces or Orthoses: Spinal Brace Spinal Brace: Lumbar corset;Applied in sitting position      Mobility  Bed Mobility Overal bed mobility: Modified Independent             General bed mobility comments: increased time    Transfers Overall transfer level: Modified independent                       Ambulation/Gait Ambulation/Gait assistance: Supervision Gait Distance (Feet): 200 Feet Assistive device: Straight cane Gait Pattern/deviations: Step-through pattern, Decreased stride length Gait velocity: decreased Gait velocity interpretation: 1.31 - 2.62 ft/sec, indicative of limited community ambulator   General Gait Details: steady gait with cane  Stairs Stairs: Yes Stairs assistance: Min guard Stair Management: One rail Right, With cane, Forwards Number of Stairs: 3 General stair comments: Pt reports no rails on home stairs but she can reach the door frame for support.  Wheelchair Mobility    Modified Rankin (Stroke Patients Only)       Balance Overall balance assessment: Mild deficits observed, not formally tested                                           Pertinent Vitals/Pain Pain Assessment Pain Assessment: 0-10 Pain Score: 4  Pain Location: back Pain Descriptors / Indicators: Discomfort, Operative site guarding Pain Intervention(s): Monitored during session, Repositioned    Home Living Family/patient expects to be discharged to:: Private residence Living Arrangements: Spouse/significant other Available Help at Discharge: Family;Available 24 hours/day Type of Home: House Home Access: Stairs to enter Entrance Stairs-Rails: None Entrance Stairs-Number of Steps: 2   Home Layout: One level Home Equipment: Cane - single Associate Professor (2 wheels);Shower seat;Grab bars - tub/shower      Prior Function Prior Level of Function : Independent/Modified Independent;Driving             Mobility Comments: cane at baseline       Hand  Dominance   Dominant Hand: Right    Extremity/Trunk Assessment   Upper Extremity Assessment Upper Extremity Assessment: Defer to OT evaluation    Lower Extremity Assessment Lower Extremity Assessment: RLE deficits/detail RLE Deficits / Details: Pt reports hip weakness at  baseline.    Cervical / Trunk Assessment Cervical / Trunk Assessment: Back Surgery  Communication   Communication: No difficulties  Cognition Arousal/Alertness: Awake/alert Behavior During Therapy: WFL for tasks assessed/performed Overall Cognitive Status: Within Functional Limits for tasks assessed                                          General Comments      Exercises     Assessment/Plan    PT Assessment Patient does not need any further PT services  PT Problem List         PT Treatment Interventions      PT Goals (Current goals can be found in the Care Plan section)  Acute Rehab PT Goals Patient Stated Goal: home PT Goal Formulation: All assessment and education complete, DC therapy    Frequency       Co-evaluation               AM-PAC PT "6 Clicks" Mobility  Outcome Measure Help needed turning from your back to your side while in a flat bed without using bedrails?: None Help needed moving from lying on your back to sitting on the side of a flat bed without using bedrails?: None Help needed moving to and from a bed to a chair (including a wheelchair)?: A Little Help needed standing up from a chair using your arms (e.g., wheelchair or bedside chair)?: A Little Help needed to walk in hospital room?: A Little Help needed climbing 3-5 steps with a railing? : A Little 6 Click Score: 20    End of Session Equipment Utilized During Treatment: Gait belt;Back brace Activity Tolerance: Patient tolerated treatment well Patient left: in bed;with call bell/phone within reach Nurse Communication: Mobility status PT Visit Diagnosis: Difficulty in walking, not elsewhere classified (R26.2)    Time: 2620-3559 PT Time Calculation (min) (ACUTE ONLY): 15 min   Charges:   PT Evaluation $PT Eval Moderate Complexity: 1 Mod          Crystal Larsen, PT  Office # [phone] Pager [phone]   Crystal Larsen 11/08/2022, 9:30 AM

## 2022-11-08 NOTE — Plan of Care (Signed)
Pt given D/C instructions with verbal understanding. Rx's were sent to the pharmacy by MD. Pt's incision is clean and dry with no sign of infection. Pt's IV was removed prior to D/C. Pt D/C'd home via wheelchair per MD order. Pt is stable @ D/C and has no other needs at this time. Sai Zinn, RN  

## 2022-11-08 NOTE — Evaluation (Signed)
Occupational Therapy Evaluation Patient Details Name: Crystal Larsen MRN: [id number] DOB: 03/31/1956 Today's Date: 11/08/2022   History of Present Illness Patient is a 66 yo female presenting to the hospital with spondylosis and pseudoarthrosis with stenosis L1-L2 lumbar radiculopathy. Patient undergoing L1-2 revision with spinal fusion on 11/07/22. PMH includes: lupus, Hashimoto's and adrenal insufficiency.   Clinical Impression   Prior to this admission, patient independent with ADLs, and using a cane for ambulation. Currently, patient with back pain at the incision, however patient at baseline for ADLs and able to demonstrate all dressing and toileting without physical assist and maintaining back precautions (minimal cues for twisiting but patient able to recognize each time independently). No OT follow up recommended, and OT signing off at this time. Please re-consult if further acute needs arise.     Recommendations for follow up therapy are one component of a multi-disciplinary discharge planning process, led by the attending physician.  Recommendations may be updated based on patient status, additional functional criteria and insurance authorization.   Follow Up Recommendations  No OT follow up     Assistance Recommended at Discharge Set up Supervision/Assistance  Patient can return home with the following Assist for transportation    Functional Status Assessment  Patient has had a recent decline in their functional status and demonstrates the ability to make significant improvements in function in a reasonable and predictable amount of time.  Equipment Recommendations  None recommended by OT    Recommendations for Other Services       Precautions / Restrictions Precautions Precautions: Back Precaution Booklet Issued: Yes (comment) Precaution Comments: Reviewed 3/3 back precautions. Handout provided. Required Braces or Orthoses: Spinal Brace Spinal Brace: Lumbar  corset;Applied in sitting position Restrictions Weight Bearing Restrictions: No      Mobility Bed Mobility               General bed mobility comments: up in bathroom upon OT entry    Transfers Overall transfer level: Modified independent                        Balance Overall balance assessment: Mild deficits observed, not formally tested                                         ADL either performed or assessed with clinical judgement   ADL Overall ADL's : At baseline                                       General ADL Comments: Patient at baseline for ADLs and able to demonstrate all dressing and toileting without physical assist and maintaining back precautions (minimal cues for twisiting but patient able to recognize each time independently)     Vision Baseline Vision/History: 1 Wears glasses Ability to See in Adequate Light: 0 Adequate Patient Visual Report: No change from baseline       Perception     Praxis      Pertinent Vitals/Pain Pain Assessment Pain Assessment: 0-10 Pain Score: 4  Pain Location: back Pain Descriptors / Indicators: Discomfort, Operative site guarding Pain Intervention(s): Limited activity within patient's tolerance, Monitored during session, Repositioned     Hand Dominance Right   Extremity/Trunk Assessment Upper Extremity Assessment Upper Extremity Assessment: Overall WFL for tasks  assessed;Generalized weakness   Lower Extremity Assessment Lower Extremity Assessment: Defer to PT evaluation RLE Deficits / Details: Pt reports hip weakness at baseline.   Cervical / Trunk Assessment Cervical / Trunk Assessment: Back Surgery   Communication Communication Communication: No difficulties   Cognition Arousal/Alertness: Awake/alert Behavior During Therapy: WFL for tasks assessed/performed Overall Cognitive Status: Within Functional Limits for tasks assessed                                        General Comments       Exercises     Shoulder Instructions      Home Living Family/patient expects to be discharged to:: Private residence Living Arrangements: Spouse/significant other Available Help at Discharge: Family;Available 24 hours/day Type of Home: House Home Access: Stairs to enter [employer] of Steps: 2 Entrance Stairs-Rails: None Home Layout: One level     Bathroom Shower/Tub: Walk-in shower;Tub only   Bathroom Toilet: Handicapped height     Home Equipment: [place] - single Associate Professor (2 wheels);Shower seat;Grab bars - tub/shower          Prior Functioning/Environment Prior Level of Function : Independent/Modified Independent;Driving             Mobility Comments: cane at baseline ADLs Comments: independent        OT Problem List: Decreased activity tolerance;Pain      OT Treatment/Interventions:      OT Goals(Current goals can be found in the care plan section) Acute Rehab OT Goals Patient Stated Goal: to get back home OT Goal Formulation: With patient Time For Goal Achievement: 11/22/22 Potential to Achieve Goals: Good  OT Frequency:      Co-evaluation              AM-PAC OT "6 Clicks" Daily Activity     Outcome Measure Help from another person eating meals?: None Help from another person taking care of personal grooming?: None Help from another person toileting, which includes using toliet, bedpan, or urinal?: None Help from another person bathing (including washing, rinsing, drying)?: None Help from another person to put on and taking off regular upper body clothing?: None Help from another person to put on and taking off regular lower body clothing?: None 6 Click Score: 24   End of Session Equipment Utilized During Treatment: Back brace Nurse Communication: Mobility status;Other (comment) (No follow up needed)  Activity Tolerance: Patient tolerated treatment well Patient  left: in bed;with call bell/phone within reach (sitting EOB)  OT Visit Diagnosis: Unsteadiness on feet (R26.81);Pain Pain - part of body:  (Back)                Time: 3810-1751 OT Time Calculation (min): 15 min Charges:  OT General Charges $OT Visit: 1 Visit OT Evaluation $OT Eval Moderate Complexity: 1 Mod  Corinne Ports E. Elleigh Cassetta, OTR/L Acute Rehabilitation Services [phone]   Ascencion Dike 11/08/2022, 10:13 AM

## 2022-11-09 ENCOUNTER — Encounter (HOSPITAL_COMMUNITY): Payer: Self-pay | Admitting: Neurological Surgery

## 2022-11-14 MED FILL — Sodium Chloride IV Soln 0.9%: INTRAVENOUS | Qty: 1000 | Status: AC

## 2022-11-14 MED FILL — Heparin Sodium (Porcine) Inj 1000 Unit/ML: INTRAMUSCULAR | Qty: 30 | Status: AC

## 2022-11-15 ENCOUNTER — Other Ambulatory Visit: Payer: Self-pay | Admitting: Neurology

## 2022-11-15 DIAGNOSIS — G43909 Migraine, unspecified, not intractable, without status migrainosus: Secondary | ICD-10-CM

## 2022-11-23 ENCOUNTER — Encounter: Payer: Self-pay | Admitting: Neurology

## 2022-11-28 DIAGNOSIS — E2740 Unspecified adrenocortical insufficiency: Secondary | ICD-10-CM | POA: Diagnosis not present

## 2022-11-28 DIAGNOSIS — M1991 Primary osteoarthritis, unspecified site: Secondary | ICD-10-CM | POA: Diagnosis not present

## 2022-11-28 DIAGNOSIS — E663 Overweight: Secondary | ICD-10-CM | POA: Diagnosis not present

## 2022-11-28 DIAGNOSIS — M329 Systemic lupus erythematosus, unspecified: Secondary | ICD-10-CM | POA: Diagnosis not present

## 2022-11-28 DIAGNOSIS — Z79899 Other long term (current) drug therapy: Secondary | ICD-10-CM | POA: Diagnosis not present

## 2022-11-28 DIAGNOSIS — Z6826 Body mass index (BMI) 26.0-26.9, adult: Secondary | ICD-10-CM | POA: Diagnosis not present

## 2022-11-28 DIAGNOSIS — M3214 Glomerular disease in systemic lupus erythematosus: Secondary | ICD-10-CM | POA: Diagnosis not present

## 2022-11-28 DIAGNOSIS — M545 Low back pain, unspecified: Secondary | ICD-10-CM | POA: Diagnosis not present

## 2022-11-29 ENCOUNTER — Other Ambulatory Visit: Payer: Self-pay | Admitting: *Deleted

## 2022-11-29 DIAGNOSIS — S32009K Unspecified fracture of unspecified lumbar vertebra, subsequent encounter for fracture with nonunion: Secondary | ICD-10-CM | POA: Diagnosis not present

## 2022-11-29 DIAGNOSIS — Z6826 Body mass index (BMI) 26.0-26.9, adult: Secondary | ICD-10-CM | POA: Diagnosis not present

## 2022-11-29 DIAGNOSIS — G43909 Migraine, unspecified, not intractable, without status migrainosus: Secondary | ICD-10-CM

## 2022-11-30 DIAGNOSIS — M329 Systemic lupus erythematosus, unspecified: Secondary | ICD-10-CM | POA: Diagnosis not present

## 2022-11-30 DIAGNOSIS — Z79899 Other long term (current) drug therapy: Secondary | ICD-10-CM | POA: Diagnosis not present

## 2022-11-30 DIAGNOSIS — R5383 Other fatigue: Secondary | ICD-10-CM | POA: Diagnosis not present

## 2022-12-12 DIAGNOSIS — N057 Unspecified nephritic syndrome with diffuse crescentic glomerulonephritis: Secondary | ICD-10-CM | POA: Diagnosis not present

## 2022-12-12 DIAGNOSIS — N182 Chronic kidney disease, stage 2 (mild): Secondary | ICD-10-CM | POA: Diagnosis not present

## 2022-12-12 DIAGNOSIS — I129 Hypertensive chronic kidney disease with stage 1 through stage 4 chronic kidney disease, or unspecified chronic kidney disease: Secondary | ICD-10-CM | POA: Diagnosis not present

## 2022-12-12 DIAGNOSIS — M3214 Glomerular disease in systemic lupus erythematosus: Secondary | ICD-10-CM | POA: Diagnosis not present

## 2022-12-12 DIAGNOSIS — N2581 Secondary hyperparathyroidism of renal origin: Secondary | ICD-10-CM | POA: Diagnosis not present

## 2022-12-12 DIAGNOSIS — N058 Unspecified nephritic syndrome with other morphologic changes: Secondary | ICD-10-CM | POA: Diagnosis not present

## 2022-12-12 DIAGNOSIS — R809 Proteinuria, unspecified: Secondary | ICD-10-CM | POA: Diagnosis not present

## 2022-12-12 DIAGNOSIS — D631 Anemia in chronic kidney disease: Secondary | ICD-10-CM | POA: Diagnosis not present

## 2022-12-20 DIAGNOSIS — R69 Illness, unspecified: Secondary | ICD-10-CM | POA: Diagnosis not present

## 2022-12-20 DIAGNOSIS — Z23 Encounter for immunization: Secondary | ICD-10-CM | POA: Diagnosis not present

## 2022-12-20 DIAGNOSIS — G43709 Chronic migraine without aura, not intractable, without status migrainosus: Secondary | ICD-10-CM | POA: Diagnosis not present

## 2022-12-20 DIAGNOSIS — M329 Systemic lupus erythematosus, unspecified: Secondary | ICD-10-CM | POA: Diagnosis not present

## 2022-12-25 DIAGNOSIS — K589 Irritable bowel syndrome without diarrhea: Secondary | ICD-10-CM | POA: Diagnosis not present

## 2022-12-27 DIAGNOSIS — S32009K Unspecified fracture of unspecified lumbar vertebra, subsequent encounter for fracture with nonunion: Secondary | ICD-10-CM | POA: Diagnosis not present

## 2022-12-27 DIAGNOSIS — M25551 Pain in right hip: Secondary | ICD-10-CM | POA: Diagnosis not present

## 2022-12-27 DIAGNOSIS — R269 Unspecified abnormalities of gait and mobility: Secondary | ICD-10-CM | POA: Diagnosis not present

## 2022-12-28 DIAGNOSIS — M329 Systemic lupus erythematosus, unspecified: Secondary | ICD-10-CM | POA: Diagnosis not present

## 2023-01-01 DIAGNOSIS — R051 Acute cough: Secondary | ICD-10-CM | POA: Diagnosis not present

## 2023-01-01 DIAGNOSIS — Z03818 Encounter for observation for suspected exposure to other biological agents ruled out: Secondary | ICD-10-CM | POA: Diagnosis not present

## 2023-01-01 DIAGNOSIS — J029 Acute pharyngitis, unspecified: Secondary | ICD-10-CM | POA: Diagnosis not present

## 2023-01-01 DIAGNOSIS — J069 Acute upper respiratory infection, unspecified: Secondary | ICD-10-CM | POA: Diagnosis not present

## 2023-01-10 DIAGNOSIS — R69 Illness, unspecified: Secondary | ICD-10-CM | POA: Diagnosis not present

## 2023-01-10 DIAGNOSIS — M329 Systemic lupus erythematosus, unspecified: Secondary | ICD-10-CM | POA: Diagnosis not present

## 2023-01-10 DIAGNOSIS — B349 Viral infection, unspecified: Secondary | ICD-10-CM | POA: Diagnosis not present

## 2023-01-10 DIAGNOSIS — R0789 Other chest pain: Secondary | ICD-10-CM | POA: Diagnosis not present

## 2023-01-15 DIAGNOSIS — H04122 Dry eye syndrome of left lacrimal gland: Secondary | ICD-10-CM | POA: Diagnosis not present

## 2023-01-15 DIAGNOSIS — H10412 Chronic giant papillary conjunctivitis, left eye: Secondary | ICD-10-CM | POA: Diagnosis not present

## 2023-01-19 DIAGNOSIS — I10 Essential (primary) hypertension: Secondary | ICD-10-CM | POA: Diagnosis not present

## 2023-01-23 DIAGNOSIS — M25551 Pain in right hip: Secondary | ICD-10-CM | POA: Diagnosis not present

## 2023-01-23 DIAGNOSIS — S32009K Unspecified fracture of unspecified lumbar vertebra, subsequent encounter for fracture with nonunion: Secondary | ICD-10-CM | POA: Diagnosis not present

## 2023-01-23 DIAGNOSIS — R269 Unspecified abnormalities of gait and mobility: Secondary | ICD-10-CM | POA: Diagnosis not present

## 2023-01-25 DIAGNOSIS — M329 Systemic lupus erythematosus, unspecified: Secondary | ICD-10-CM | POA: Diagnosis not present

## 2023-02-08 DIAGNOSIS — M25551 Pain in right hip: Secondary | ICD-10-CM | POA: Diagnosis not present

## 2023-02-08 DIAGNOSIS — R269 Unspecified abnormalities of gait and mobility: Secondary | ICD-10-CM | POA: Diagnosis not present

## 2023-02-08 DIAGNOSIS — S32009K Unspecified fracture of unspecified lumbar vertebra, subsequent encounter for fracture with nonunion: Secondary | ICD-10-CM | POA: Diagnosis not present

## 2023-02-09 ENCOUNTER — Other Ambulatory Visit (HOSPITAL_BASED_OUTPATIENT_CLINIC_OR_DEPARTMENT_OTHER): Payer: Self-pay | Admitting: Neurological Surgery

## 2023-02-09 DIAGNOSIS — S32009K Unspecified fracture of unspecified lumbar vertebra, subsequent encounter for fracture with nonunion: Secondary | ICD-10-CM

## 2023-02-19 DIAGNOSIS — R269 Unspecified abnormalities of gait and mobility: Secondary | ICD-10-CM | POA: Diagnosis not present

## 2023-02-19 DIAGNOSIS — M25551 Pain in right hip: Secondary | ICD-10-CM | POA: Diagnosis not present

## 2023-02-19 DIAGNOSIS — S32009K Unspecified fracture of unspecified lumbar vertebra, subsequent encounter for fracture with nonunion: Secondary | ICD-10-CM | POA: Diagnosis not present

## 2023-02-20 ENCOUNTER — Ambulatory Visit (HOSPITAL_BASED_OUTPATIENT_CLINIC_OR_DEPARTMENT_OTHER)
Admission: RE | Admit: 2023-02-20 | Discharge: 2023-02-20 | Disposition: A | Discharge status: Home / Self Care | Payer: Medicare HMO | Source: Ambulatory Visit | Attending: Neurological Surgery | Admitting: Neurological Surgery

## 2023-02-20 DIAGNOSIS — S32009K Unspecified fracture of unspecified lumbar vertebra, subsequent encounter for fracture with nonunion: Secondary | ICD-10-CM | POA: Diagnosis not present

## 2023-02-20 DIAGNOSIS — S32009A Unspecified fracture of unspecified lumbar vertebra, initial encounter for closed fracture: Secondary | ICD-10-CM | POA: Diagnosis not present

## 2023-02-22 DIAGNOSIS — M329 Systemic lupus erythematosus, unspecified: Secondary | ICD-10-CM | POA: Diagnosis not present

## 2023-02-26 DIAGNOSIS — M25551 Pain in right hip: Secondary | ICD-10-CM | POA: Diagnosis not present

## 2023-02-26 DIAGNOSIS — R269 Unspecified abnormalities of gait and mobility: Secondary | ICD-10-CM | POA: Diagnosis not present

## 2023-02-26 DIAGNOSIS — S32009K Unspecified fracture of unspecified lumbar vertebra, subsequent encounter for fracture with nonunion: Secondary | ICD-10-CM | POA: Diagnosis not present

## 2023-03-06 DIAGNOSIS — R269 Unspecified abnormalities of gait and mobility: Secondary | ICD-10-CM | POA: Diagnosis not present

## 2023-03-06 DIAGNOSIS — S32009K Unspecified fracture of unspecified lumbar vertebra, subsequent encounter for fracture with nonunion: Secondary | ICD-10-CM | POA: Diagnosis not present

## 2023-03-06 DIAGNOSIS — M25551 Pain in right hip: Secondary | ICD-10-CM | POA: Diagnosis not present

## 2023-03-14 DIAGNOSIS — Z6827 Body mass index (BMI) 27.0-27.9, adult: Secondary | ICD-10-CM | POA: Diagnosis not present

## 2023-03-14 DIAGNOSIS — M4316 Spondylolisthesis, lumbar region: Secondary | ICD-10-CM | POA: Diagnosis not present

## 2023-03-21 DIAGNOSIS — R269 Unspecified abnormalities of gait and mobility: Secondary | ICD-10-CM | POA: Diagnosis not present

## 2023-03-21 DIAGNOSIS — M25551 Pain in right hip: Secondary | ICD-10-CM | POA: Diagnosis not present

## 2023-03-21 DIAGNOSIS — S32009K Unspecified fracture of unspecified lumbar vertebra, subsequent encounter for fracture with nonunion: Secondary | ICD-10-CM | POA: Diagnosis not present

## 2023-03-22 DIAGNOSIS — M329 Systemic lupus erythematosus, unspecified: Secondary | ICD-10-CM | POA: Diagnosis not present

## 2023-03-28 DIAGNOSIS — Z09 Encounter for follow-up examination after completed treatment for conditions other than malignant neoplasm: Secondary | ICD-10-CM | POA: Diagnosis not present

## 2023-03-28 DIAGNOSIS — L814 Other melanin hyperpigmentation: Secondary | ICD-10-CM | POA: Diagnosis not present

## 2023-03-28 DIAGNOSIS — L578 Other skin changes due to chronic exposure to nonionizing radiation: Secondary | ICD-10-CM | POA: Diagnosis not present

## 2023-03-28 DIAGNOSIS — L821 Other seborrheic keratosis: Secondary | ICD-10-CM | POA: Diagnosis not present

## 2023-03-29 DIAGNOSIS — Z9989 Dependence on other enabling machines and devices: Secondary | ICD-10-CM | POA: Diagnosis not present

## 2023-03-29 DIAGNOSIS — D84821 Immunodeficiency due to drugs: Secondary | ICD-10-CM | POA: Diagnosis not present

## 2023-03-29 DIAGNOSIS — Z79620 Long term (current) use of immunosuppressive biologic: Secondary | ICD-10-CM | POA: Diagnosis not present

## 2023-03-29 DIAGNOSIS — I10 Essential (primary) hypertension: Secondary | ICD-10-CM | POA: Diagnosis not present

## 2023-04-03 DIAGNOSIS — M3214 Glomerular disease in systemic lupus erythematosus: Secondary | ICD-10-CM | POA: Diagnosis not present

## 2023-04-03 DIAGNOSIS — M329 Systemic lupus erythematosus, unspecified: Secondary | ICD-10-CM | POA: Diagnosis not present

## 2023-04-03 DIAGNOSIS — R5383 Other fatigue: Secondary | ICD-10-CM | POA: Diagnosis not present

## 2023-04-03 DIAGNOSIS — M545 Low back pain, unspecified: Secondary | ICD-10-CM | POA: Diagnosis not present

## 2023-04-03 DIAGNOSIS — Z6827 Body mass index (BMI) 27.0-27.9, adult: Secondary | ICD-10-CM | POA: Diagnosis not present

## 2023-04-03 DIAGNOSIS — M064 Inflammatory polyarthropathy: Secondary | ICD-10-CM | POA: Diagnosis not present

## 2023-04-03 DIAGNOSIS — E663 Overweight: Secondary | ICD-10-CM | POA: Diagnosis not present

## 2023-04-03 DIAGNOSIS — Z79899 Other long term (current) drug therapy: Secondary | ICD-10-CM | POA: Diagnosis not present

## 2023-04-03 DIAGNOSIS — M1991 Primary osteoarthritis, unspecified site: Secondary | ICD-10-CM | POA: Diagnosis not present

## 2023-04-05 DIAGNOSIS — S32009K Unspecified fracture of unspecified lumbar vertebra, subsequent encounter for fracture with nonunion: Secondary | ICD-10-CM | POA: Diagnosis not present

## 2023-04-05 DIAGNOSIS — M25551 Pain in right hip: Secondary | ICD-10-CM | POA: Diagnosis not present

## 2023-04-05 DIAGNOSIS — R269 Unspecified abnormalities of gait and mobility: Secondary | ICD-10-CM | POA: Diagnosis not present

## 2023-04-19 DIAGNOSIS — Z79899 Other long term (current) drug therapy: Secondary | ICD-10-CM | POA: Diagnosis not present

## 2023-04-19 DIAGNOSIS — R5383 Other fatigue: Secondary | ICD-10-CM | POA: Diagnosis not present

## 2023-04-19 DIAGNOSIS — M329 Systemic lupus erythematosus, unspecified: Secondary | ICD-10-CM | POA: Diagnosis not present

## 2023-05-17 DIAGNOSIS — M329 Systemic lupus erythematosus, unspecified: Secondary | ICD-10-CM | POA: Diagnosis not present

## 2023-06-08 ENCOUNTER — Other Ambulatory Visit: Payer: Self-pay | Admitting: Family Medicine

## 2023-06-08 ENCOUNTER — Ambulatory Visit
Admission: RE | Admit: 2023-06-08 | Discharge: 2023-06-08 | Disposition: A | Discharge status: Home / Self Care | Payer: Medicare HMO | Source: Ambulatory Visit | Attending: Family Medicine | Admitting: Family Medicine

## 2023-06-08 DIAGNOSIS — J189 Pneumonia, unspecified organism: Secondary | ICD-10-CM

## 2023-06-08 DIAGNOSIS — R051 Acute cough: Secondary | ICD-10-CM | POA: Diagnosis not present

## 2023-06-08 DIAGNOSIS — J069 Acute upper respiratory infection, unspecified: Secondary | ICD-10-CM | POA: Diagnosis not present

## 2023-06-08 DIAGNOSIS — Z03818 Encounter for observation for suspected exposure to other biological agents ruled out: Secondary | ICD-10-CM | POA: Diagnosis not present

## 2023-06-14 DIAGNOSIS — J189 Pneumonia, unspecified organism: Secondary | ICD-10-CM | POA: Diagnosis not present

## 2023-06-21 DIAGNOSIS — D225 Melanocytic nevi of trunk: Secondary | ICD-10-CM | POA: Diagnosis not present

## 2023-06-21 DIAGNOSIS — L570 Actinic keratosis: Secondary | ICD-10-CM | POA: Diagnosis not present

## 2023-06-21 DIAGNOSIS — L821 Other seborrheic keratosis: Secondary | ICD-10-CM | POA: Diagnosis not present

## 2023-06-21 DIAGNOSIS — L814 Other melanin hyperpigmentation: Secondary | ICD-10-CM | POA: Diagnosis not present

## 2023-06-25 DIAGNOSIS — M329 Systemic lupus erythematosus, unspecified: Secondary | ICD-10-CM | POA: Diagnosis not present

## 2023-07-03 DIAGNOSIS — B370 Candidal stomatitis: Secondary | ICD-10-CM | POA: Diagnosis not present

## 2023-07-03 DIAGNOSIS — J45909 Unspecified asthma, uncomplicated: Secondary | ICD-10-CM | POA: Diagnosis not present

## 2023-07-03 DIAGNOSIS — M329 Systemic lupus erythematosus, unspecified: Secondary | ICD-10-CM | POA: Diagnosis not present

## 2023-07-04 DIAGNOSIS — H52203 Unspecified astigmatism, bilateral: Secondary | ICD-10-CM | POA: Diagnosis not present

## 2023-07-04 DIAGNOSIS — Z961 Presence of intraocular lens: Secondary | ICD-10-CM | POA: Diagnosis not present

## 2023-07-06 DIAGNOSIS — M4316 Spondylolisthesis, lumbar region: Secondary | ICD-10-CM | POA: Diagnosis not present

## 2023-07-06 DIAGNOSIS — Z6826 Body mass index (BMI) 26.0-26.9, adult: Secondary | ICD-10-CM | POA: Diagnosis not present

## 2023-07-10 DIAGNOSIS — Z1231 Encounter for screening mammogram for malignant neoplasm of breast: Secondary | ICD-10-CM | POA: Diagnosis not present

## 2023-07-24 DIAGNOSIS — M329 Systemic lupus erythematosus, unspecified: Secondary | ICD-10-CM | POA: Diagnosis not present

## 2023-07-24 DIAGNOSIS — Z79899 Other long term (current) drug therapy: Secondary | ICD-10-CM | POA: Diagnosis not present

## 2023-08-21 DIAGNOSIS — R7989 Other specified abnormal findings of blood chemistry: Secondary | ICD-10-CM | POA: Diagnosis not present

## 2023-08-21 DIAGNOSIS — M329 Systemic lupus erythematosus, unspecified: Secondary | ICD-10-CM | POA: Diagnosis not present

## 2023-08-23 DIAGNOSIS — M3214 Glomerular disease in systemic lupus erythematosus: Secondary | ICD-10-CM | POA: Diagnosis not present

## 2023-08-23 DIAGNOSIS — N182 Chronic kidney disease, stage 2 (mild): Secondary | ICD-10-CM | POA: Diagnosis not present

## 2023-08-23 DIAGNOSIS — N058 Unspecified nephritic syndrome with other morphologic changes: Secondary | ICD-10-CM | POA: Diagnosis not present

## 2023-08-23 DIAGNOSIS — E7800 Pure hypercholesterolemia, unspecified: Secondary | ICD-10-CM | POA: Diagnosis not present

## 2023-08-23 DIAGNOSIS — N183 Chronic kidney disease, stage 3 unspecified: Secondary | ICD-10-CM | POA: Diagnosis not present

## 2023-08-23 DIAGNOSIS — E039 Hypothyroidism, unspecified: Secondary | ICD-10-CM | POA: Diagnosis not present

## 2023-08-23 DIAGNOSIS — I129 Hypertensive chronic kidney disease with stage 1 through stage 4 chronic kidney disease, or unspecified chronic kidney disease: Secondary | ICD-10-CM | POA: Diagnosis not present

## 2023-08-23 DIAGNOSIS — N057 Unspecified nephritic syndrome with diffuse crescentic glomerulonephritis: Secondary | ICD-10-CM | POA: Diagnosis not present

## 2023-08-23 DIAGNOSIS — E2740 Unspecified adrenocortical insufficiency: Secondary | ICD-10-CM | POA: Diagnosis not present

## 2023-08-23 DIAGNOSIS — D631 Anemia in chronic kidney disease: Secondary | ICD-10-CM | POA: Diagnosis not present

## 2023-08-23 DIAGNOSIS — R809 Proteinuria, unspecified: Secondary | ICD-10-CM | POA: Diagnosis not present

## 2023-08-28 DIAGNOSIS — F322 Major depressive disorder, single episode, severe without psychotic features: Secondary | ICD-10-CM | POA: Diagnosis not present

## 2023-08-28 DIAGNOSIS — Z23 Encounter for immunization: Secondary | ICD-10-CM | POA: Diagnosis not present

## 2023-08-28 DIAGNOSIS — N183 Chronic kidney disease, stage 3 unspecified: Secondary | ICD-10-CM | POA: Diagnosis not present

## 2023-08-28 DIAGNOSIS — E7800 Pure hypercholesterolemia, unspecified: Secondary | ICD-10-CM | POA: Diagnosis not present

## 2023-08-28 DIAGNOSIS — E039 Hypothyroidism, unspecified: Secondary | ICD-10-CM | POA: Diagnosis not present

## 2023-08-28 DIAGNOSIS — Z0000 Encounter for general adult medical examination without abnormal findings: Secondary | ICD-10-CM | POA: Diagnosis not present

## 2023-08-31 DIAGNOSIS — E039 Hypothyroidism, unspecified: Secondary | ICD-10-CM | POA: Diagnosis not present

## 2023-08-31 DIAGNOSIS — E063 Autoimmune thyroiditis: Secondary | ICD-10-CM | POA: Diagnosis not present

## 2023-08-31 DIAGNOSIS — E2740 Unspecified adrenocortical insufficiency: Secondary | ICD-10-CM | POA: Diagnosis not present

## 2023-08-31 DIAGNOSIS — E049 Nontoxic goiter, unspecified: Secondary | ICD-10-CM | POA: Diagnosis not present

## 2023-10-03 DIAGNOSIS — M8588 Other specified disorders of bone density and structure, other site: Secondary | ICD-10-CM | POA: Diagnosis not present

## 2023-10-03 DIAGNOSIS — E2839 Other primary ovarian failure: Secondary | ICD-10-CM | POA: Diagnosis not present

## 2023-10-03 DIAGNOSIS — Z7952 Long term (current) use of systemic steroids: Secondary | ICD-10-CM | POA: Diagnosis not present

## 2023-10-04 DIAGNOSIS — R5383 Other fatigue: Secondary | ICD-10-CM | POA: Diagnosis not present

## 2023-10-04 DIAGNOSIS — M1991 Primary osteoarthritis, unspecified site: Secondary | ICD-10-CM | POA: Diagnosis not present

## 2023-10-04 DIAGNOSIS — Z79899 Other long term (current) drug therapy: Secondary | ICD-10-CM | POA: Diagnosis not present

## 2023-10-04 DIAGNOSIS — M329 Systemic lupus erythematosus, unspecified: Secondary | ICD-10-CM | POA: Diagnosis not present

## 2023-10-04 DIAGNOSIS — R7989 Other specified abnormal findings of blood chemistry: Secondary | ICD-10-CM | POA: Diagnosis not present

## 2023-10-04 DIAGNOSIS — E663 Overweight: Secondary | ICD-10-CM | POA: Diagnosis not present

## 2023-10-04 DIAGNOSIS — M064 Inflammatory polyarthropathy: Secondary | ICD-10-CM | POA: Diagnosis not present

## 2023-10-04 DIAGNOSIS — E2740 Unspecified adrenocortical insufficiency: Secondary | ICD-10-CM | POA: Diagnosis not present

## 2023-10-04 DIAGNOSIS — M3214 Glomerular disease in systemic lupus erythematosus: Secondary | ICD-10-CM | POA: Diagnosis not present

## 2023-10-04 DIAGNOSIS — Z6828 Body mass index (BMI) 28.0-28.9, adult: Secondary | ICD-10-CM | POA: Diagnosis not present

## 2023-10-16 DIAGNOSIS — M329 Systemic lupus erythematosus, unspecified: Secondary | ICD-10-CM | POA: Diagnosis not present

## 2023-10-17 DIAGNOSIS — S5002XA Contusion of left elbow, initial encounter: Secondary | ICD-10-CM | POA: Diagnosis not present

## 2023-10-17 DIAGNOSIS — M1990 Unspecified osteoarthritis, unspecified site: Secondary | ICD-10-CM | POA: Diagnosis not present

## 2023-10-17 DIAGNOSIS — N390 Urinary tract infection, site not specified: Secondary | ICD-10-CM | POA: Diagnosis not present

## 2023-10-17 DIAGNOSIS — F322 Major depressive disorder, single episode, severe without psychotic features: Secondary | ICD-10-CM | POA: Diagnosis not present

## 2023-10-17 DIAGNOSIS — W19XXXA Unspecified fall, initial encounter: Secondary | ICD-10-CM | POA: Diagnosis not present

## 2023-10-17 DIAGNOSIS — Z9989 Dependence on other enabling machines and devices: Secondary | ICD-10-CM | POA: Diagnosis not present

## 2023-10-30 DIAGNOSIS — J189 Pneumonia, unspecified organism: Secondary | ICD-10-CM | POA: Diagnosis not present

## 2023-10-30 DIAGNOSIS — Z20822 Contact with and (suspected) exposure to covid-19: Secondary | ICD-10-CM | POA: Diagnosis not present

## 2023-11-06 DIAGNOSIS — J988 Other specified respiratory disorders: Secondary | ICD-10-CM | POA: Diagnosis not present

## 2023-11-06 DIAGNOSIS — E2740 Unspecified adrenocortical insufficiency: Secondary | ICD-10-CM | POA: Diagnosis not present

## 2023-12-05 ENCOUNTER — Emergency Department (HOSPITAL_BASED_OUTPATIENT_CLINIC_OR_DEPARTMENT_OTHER): Payer: Medicare HMO

## 2023-12-05 ENCOUNTER — Encounter (HOSPITAL_BASED_OUTPATIENT_CLINIC_OR_DEPARTMENT_OTHER): Payer: Self-pay | Admitting: Emergency Medicine

## 2023-12-05 ENCOUNTER — Emergency Department (HOSPITAL_BASED_OUTPATIENT_CLINIC_OR_DEPARTMENT_OTHER)
Admission: EM | Admit: 2023-12-05 | Discharge: 2023-12-05 | Disposition: A | Discharge status: Home / Self Care | Payer: Medicare HMO | Attending: Emergency Medicine | Admitting: Emergency Medicine

## 2023-12-05 ENCOUNTER — Other Ambulatory Visit: Payer: Self-pay

## 2023-12-05 DIAGNOSIS — N289 Disorder of kidney and ureter, unspecified: Secondary | ICD-10-CM | POA: Diagnosis not present

## 2023-12-05 DIAGNOSIS — Z79899 Other long term (current) drug therapy: Secondary | ICD-10-CM | POA: Insufficient documentation

## 2023-12-05 DIAGNOSIS — R109 Unspecified abdominal pain: Secondary | ICD-10-CM | POA: Diagnosis not present

## 2023-12-05 DIAGNOSIS — Z91040 Latex allergy status: Secondary | ICD-10-CM | POA: Diagnosis not present

## 2023-12-05 DIAGNOSIS — I10 Essential (primary) hypertension: Secondary | ICD-10-CM | POA: Diagnosis not present

## 2023-12-05 DIAGNOSIS — R748 Abnormal levels of other serum enzymes: Secondary | ICD-10-CM | POA: Insufficient documentation

## 2023-12-05 DIAGNOSIS — R1031 Right lower quadrant pain: Secondary | ICD-10-CM | POA: Diagnosis not present

## 2023-12-05 DIAGNOSIS — N281 Cyst of kidney, acquired: Secondary | ICD-10-CM | POA: Diagnosis not present

## 2023-12-05 DIAGNOSIS — Z7982 Long term (current) use of aspirin: Secondary | ICD-10-CM | POA: Insufficient documentation

## 2023-12-05 DIAGNOSIS — K838 Other specified diseases of biliary tract: Secondary | ICD-10-CM | POA: Diagnosis not present

## 2023-12-05 DIAGNOSIS — Q6102 Congenital multiple renal cysts: Secondary | ICD-10-CM

## 2023-12-05 LAB — CBC
HCT: 41.6 % (ref 36.0–46.0)
Hemoglobin: 13.9 g/dL (ref 12.0–15.0)
MCH: 30.3 pg (ref 26.0–34.0)
MCHC: 33.4 g/dL (ref 30.0–36.0)
MCV: 90.6 fL (ref 80.0–100.0)
Platelets: 255 10*3/uL (ref 150–400)
RBC: 4.59 MIL/uL (ref 3.87–5.11)
RDW: 14 % (ref 11.5–15.5)
WBC: 6.3 10*3/uL (ref 4.0–10.5)
nRBC: 0 % (ref 0.0–0.2)

## 2023-12-05 LAB — COMPREHENSIVE METABOLIC PANEL
ALT: 26 U/L (ref 0–44)
AST: 33 U/L (ref 15–41)
Albumin: 4.4 g/dL (ref 3.5–5.0)
Alkaline Phosphatase: 58 U/L (ref 38–126)
Anion gap: 10 (ref 5–15)
BUN: 19 mg/dL (ref 8–23)
CO2: 26 mmol/L (ref 22–32)
Calcium: 9.9 mg/dL (ref 8.9–10.3)
Chloride: 102 mmol/L (ref 98–111)
Creatinine, Ser: 0.95 mg/dL (ref 0.44–1.00)
GFR, Estimated: >60 mL/min (ref >60)
Glucose, Bld: 100 mg/dL — ABNORMAL HIGH (ref 70–99)
Potassium: 3.5 mmol/L (ref 3.5–5.1)
Sodium: 138 mmol/L (ref 135–145)
Total Bilirubin: 0.6 mg/dL (ref 0.0–1.2)
Total Protein: 7.2 g/dL (ref 6.5–8.1)

## 2023-12-05 LAB — URINALYSIS, ROUTINE W REFLEX MICROSCOPIC
Bacteria, UA: NONE SEEN
Bilirubin Urine: NEGATIVE
Glucose, UA: NEGATIVE mg/dL
Hgb urine dipstick: NEGATIVE
Nitrite: NEGATIVE
Protein, ur: 30 mg/dL — AB
Specific Gravity, Urine: 1.03 (ref 1.005–1.030)
pH: 6.5 (ref 5.0–8.0)

## 2023-12-05 LAB — LIPASE, BLOOD: Lipase: 104 U/L — ABNORMAL HIGH (ref 11–51)

## 2023-12-05 MED ORDER — KETOROLAC TROMETHAMINE 15 MG/ML IJ SOLN
15.0000 mg | Freq: Once | INTRAMUSCULAR | Status: AC
  Administered 2023-12-05: 15 mg via INTRAVENOUS
  Filled 2023-12-05: qty 1

## 2023-12-05 MED ORDER — IOHEXOL 300 MG/ML  SOLN
100.0000 mL | Freq: Once | INTRAMUSCULAR | Status: AC | PRN
  Administered 2023-12-05: 85 mL via INTRAVENOUS

## 2023-12-05 MED ORDER — OXYCODONE-ACETAMINOPHEN 5-325 MG PO TABS: 1.0000 | ORAL_TABLET | Freq: Four times a day (QID) | ORAL | 0 refills | Status: AC | PRN

## 2023-12-05 NOTE — ED Notes (Signed)
 Pt given discharge instructions and reviewed prescriptions. Opportunities given for questions. Pt verbalizes understanding. PIV removed x1. Jillyn Hidden, RN

## 2023-12-05 NOTE — Discharge Instructions (Addendum)
 Follow-up with your primary care doctor for reevaluation.  You need to have your lipase rechecked as well as an ultrasound of your kidneys.  Return to the emergency room if you have any worsening symptoms.

## 2023-12-05 NOTE — ED Notes (Signed)
 Patient transported to CT

## 2023-12-05 NOTE — ED Provider Notes (Addendum)
 [facility] AT [facility] Provider Note   CSN: [id number] Arrival date & time: 12/05/23  9143     History  Chief Complaint  Patient presents with   Abdominal Pain   Back Pain    Crystal Larsen is a 68 y.o. female.  Patient is a 68 year old female who presents with abdominal pain.  She has a history of GERD, gallstones, hypertension, IBS, lupus, adrenal insufficiency, prior pancreatitis. she is status postcholecystectomy.  She presents with pain in her right flank.  She started having pain in her right back during the night last night.  This morning it was radiating to her right lower abdomen.  It is eased off a little bit now.  It waxes and wanes in intensity.  A little bit worse with movement.  She does say that she was doing a lot of work at home with moving of boxes.  She denies any radiation down her legs.  No numbness or weakness to her legs.  No associated nausea or vomiting.  No change in her stools.  No fevers.  No urinary symptoms.       Home Medications Prior to Admission medications   Medication Sig Start Date End Date Taking? Authorizing Provider  albuterol  (VENTOLIN  HFA) 108 (90 Base) MCG/ACT inhaler Inhale 2 puffs into the lungs every 4 (four) hours as needed. 10/30/23  Yes [provider]  oxyCODONE -acetaminophen  (PERCOCET/ROXICET) 5-325 MG tablet Take 1 tablet by mouth every 6 (six) hours as needed for severe pain (pain score 7-10). 12/05/23  Yes Lenor Hollering, MD  acetaminophen  (TYLENOL ) 500 MG tablet Take 1,000 mg by mouth 3 (three) times daily.    [provider]  Alpha-Lipoic Acid (LIPOIC ACID PO) Take 1 capsule by mouth daily.    [provider]  amLODipine  (NORVASC ) 5 MG tablet Take 5 mg by mouth daily.    [provider]  aspirin  EC 81 MG tablet Take 81 mg by mouth at bedtime.    [provider]  Belimumab  (BENLYSTA  IV) 1 Dose by Intravenous (Continuous Infusion) route every 30 (thirty)  days. 05/13/20   [provider]  Cholecalciferol (VITAMIN D) 50 MCG (2000 UT) tablet Take 2,000 Units by mouth daily.    [provider]  desvenlafaxine  (PRISTIQ ) 50 MG 24 hr tablet TAKE 1 TABLET BY MOUTH DAILY 05/15/22   Dohmeier, Dedra, MD  dicyclomine  (BENTYL ) 20 MG tablet Take 20 mg by mouth daily as needed (abdominal pain).     [provider]  divalproex  (DEPAKOTE  ER) 250 MG 24 hr tablet Take 1 tablet (250 mg total) by mouth daily. 02/20/22   Dohmeier, Dedra, MD  doxylamine , Sleep, (UNISOM ) 25 MG tablet Take 1 tablet (25 mg total) by mouth at bedtime as needed. 02/13/22   Dohmeier, Dedra, MD  hydrocortisone  (CORTEF ) 10 MG tablet Take 5-10 mg by mouth See admin instructions. Take 15 mg by mouth in the morning and 5 mg in the afternoon.    [provider]  hydroxychloroquine  (PLAQUENIL ) 200 MG tablet Take 200 mg by mouth 2 (two) times daily. 11/25/19   [provider]  levocetirizine (XYZAL ) 5 MG tablet Take 5 mg by mouth at bedtime.    [provider]  levothyroxine  (SYNTHROID ) 200 MCG tablet Take 200 mcg by mouth daily before breakfast.    [provider]  lidocaine -prilocaine  (EMLA ) cream Apply 1 application topically as needed. Patient not taking: Reported on 10/30/2022 09/14/21   Dohmeier, Dedra, MD  losartan -hydrochlorothiazide  [facility])  100-12.5 MG tablet Take 1 tablet by mouth daily.    [provider]  Melatonin 10 MG TABS Take 10 mg by mouth at bedtime.    [provider]  methocarbamol  (ROBAXIN ) 500 MG tablet Take 1 tablet (500 mg total) by mouth every 6 (six) hours as needed for muscle spasms. 11/08/22   Colon Shove, MD  Multiple Vitamin (MULTI-VITAMIN DAILY PO) Take 1 tablet by mouth daily.    [provider]  mycophenolate  (CELLCEPT ) 500 MG tablet Take 500-1,000 mg by mouth See admin instructions. Take 1000 mg by mouth in the morning and 500 mg in the evening    [provider]   niacin  (NIASPAN ) 500 MG CR tablet Take 500 mg by mouth at bedtime.    [provider]  Omega-3 Fatty Acids (OMEGA 3 PO) Take 1 capsule by mouth at bedtime.    [provider]  oxymetazoline  (AFRIN) 0.05 % nasal spray Place 1 spray into both nostrils 2 (two) times daily as needed for congestion.    [provider]  pregabalin  (LYRICA ) 25 MG capsule Take 1 capsule (25 mg total) by mouth 2 (two) times daily. Patient not taking: Reported on 10/30/2022 02/13/22   Dohmeier, Dedra, MD  Probiotic Product (PROBIOTIC PO) Take 1 capsule by mouth daily.     [provider]      Allergies    Ultram [tramadol], Hydrocodone -acetaminophen , Adhesive [tape], Erythromycin, Latex, Neurontin [gabapentin], and Sulfonamide derivatives    Review of Systems   Review of Systems  Constitutional:  Negative for chills, diaphoresis, fatigue and fever.  HENT:  Negative for congestion, rhinorrhea and sneezing.   Eyes: Negative.   Respiratory:  Negative for cough, chest tightness and shortness of breath.   Cardiovascular:  Negative for chest pain and leg swelling.  Gastrointestinal:  Positive for abdominal pain. Negative for blood in stool, diarrhea, nausea and vomiting.  Genitourinary:  Positive for flank pain. Negative for difficulty urinating, frequency and hematuria.  Musculoskeletal:  Positive for back pain. Negative for arthralgias.  Skin:  Negative for rash.  Neurological:  Negative for dizziness, speech difficulty, weakness, numbness and headaches.    Physical Exam Updated Vital Signs BP 134/84   Pulse 63   Temp 97.8 F (36.6 C)   Resp 18   Ht 5' 6 (1.676 m)   Wt 79.4 kg   SpO2 100%   BMI 28.25 kg/m  Physical Exam Constitutional:      Appearance: She is well-developed.  HENT:     Head: Normocephalic and atraumatic.  Eyes:     Pupils: Pupils are equal, round, and reactive to light.  Cardiovascular:     Rate and Rhythm: Normal rate and regular rhythm.      Heart sounds: Normal heart sounds.  Pulmonary:     Effort: Pulmonary effort is normal. No respiratory distress.     Breath sounds: Normal breath sounds. No wheezing or rales.  Chest:     Chest wall: No tenderness.  Abdominal:     General: Bowel sounds are normal.     Palpations: Abdomen is soft.     Tenderness: There is abdominal tenderness in the right lower quadrant. There is no guarding or rebound.  Musculoskeletal:        General: Normal range of motion.     Cervical back: Normal range of motion and neck supple.  Lymphadenopathy:     Cervical: No cervical adenopathy.  Skin:    General: Skin is warm and dry.  Findings: No rash.  Neurological:     Mental Status: She is alert and oriented to person, place, and time.     ED Results / Procedures / Treatments   Labs (all labs ordered are listed, but only abnormal results are displayed) Labs Reviewed  LIPASE, BLOOD - Abnormal; Notable for the following components:      Result Value   Lipase 104 (*)    All other components within normal limits  COMPREHENSIVE METABOLIC PANEL - Abnormal; Notable for the following components:   Glucose, Bld 100 (*)    All other components within normal limits  URINALYSIS, ROUTINE W REFLEX MICROSCOPIC - Abnormal; Notable for the following components:   Ketones, ur TRACE (*)    Protein, ur 30 (*)    Leukocytes,Ua TRACE (*)    All other components within normal limits  CBC    EKG None  Radiology CT ABDOMEN PELVIS W CONTRAST Result Date: 12/05/2023 CLINICAL DATA:  Acute onset sharp right flank and abdominal pain EXAM: CT ABDOMEN AND PELVIS WITH CONTRAST TECHNIQUE: Multidetector CT imaging of the abdomen and pelvis was performed using the standard protocol following bolus administration of intravenous contrast. RADIATION DOSE REDUCTION: This exam was performed according to the departmental dose-optimization program which includes automated exposure control, adjustment of the mA and/or kV  according to patient size and/or use of iterative reconstruction technique. CONTRAST:  85mL OMNIPAQUE  IOHEXOL  300 MG/ML  SOLN COMPARISON:  CT abdomen and pelvis dated 12/17/2019, MRI abdomen dated 02/23/2016 FINDINGS: Lower chest: No focal consolidation or pulmonary nodule in the lung bases. No pleural effusion or pneumothorax demonstrated. Partially imaged heart size is normal. Hepatobiliary: No focal hepatic lesions. Mild intra and extrahepatic bile duct dilation, likely secondary to cholecystectomy. Pancreas: No focal lesions or main ductal dilation. Spleen: Normal in size without focal abnormality. Adrenals/Urinary Tract: No adrenal nodules. No calculi or hydronephrosis. Left lower pole hypoattenuating focus measuring 1.1 cm (2:39) does not measure simple attenuation. This lesion was previously characterized as likely hemorrhagic/proteinaceous cyst on MRI abdomen 02/23/2016 when it was subcentimeter. Interval increase in size of 1.3 cm left upper pole hypoattenuating lesion, incompletely characterized due to beam hardening artifact from adjacent spinal hardware. No focal bladder wall thickening. Stomach/Bowel: Normal appearance of the stomach. No evidence of bowel wall thickening, distention, or inflammatory changes. Right lower quadrant appendix measures up to 8 mm in diameter with an appendicolith at the appendiceal base, grossly unchanged compared to 12/17/2019. Vascular/Lymphatic: Aortic atherosclerosis. No enlarged abdominal or pelvic lymph nodes. Reproductive: No adnexal masses. Coarse calcifications in the uterine fundus, likely calcified leiomyomata. Other: No free fluid, fluid collection, or free air. Musculoskeletal: No acute or abnormal lytic or blastic osseous lesions. Postsurgical changes from L1-S1 spinal fixation with similar appearance of sacral screws extending beyond the anterior margin of the sacrum. IMPRESSION: 1. No acute abdominopelvic findings. 2. Grossly unchanged appearance of the right  lower quadrant appendix compared to 12/17/2019. 3. Interval increase in size of 1.3 cm left upper pole renal hypoattenuating lesion, incompletely characterized due to beam hardening artifact from adjacent spinal hardware and left lower pole hypoattenuating focus measuring 1.1 cm, which does not measure simple attenuation. These lesions may represent hemorrhagic/proteinaceous cysts. Given the extensive adjacent spinal hardware, a non-emergent renal ultrasound should be considered for further evaluation. 4.  Aortic Atherosclerosis (ICD10-I70.0). Electronically Signed   By: Limin  Xu M.D.   On: 12/05/2023 11:53    Procedures Procedures    Medications Ordered in ED Medications  iohexol  (OMNIPAQUE ) 300  MG/ML solution 100 mL (85 mLs Intravenous Contrast Given 12/05/23 1112)  ketorolac  (TORADOL ) 15 MG/ML injection 15 mg (15 mg Intravenous Given 12/05/23 1230)    ED Course/ Medical Decision Making/ A&P                                 Medical Decision Making Amount and/or Complexity of Data Reviewed Labs: ordered. Radiology: ordered.  Risk Prescription drug management.   Patient is a 68 year old who presents with flank pain.  She has pain in her right mid back and a little bit in her right lower abdomen.  Is not really reproducible on palpation.  She does not have any neurologic deficits or cauda equina signs.  Her labs are nonconcerning.  White count is normal.  Her lipase is mildly elevated but she does not have any pain over her upper abdomen which be more concerning for pancreatitis.  Other LFTs are normal.  Her urine is not consistent with infection.  No hematuria.  CT scan was performed which showed no acute abnormalities.  She does have an appendicolith but per CT reading, does not appear changed as compared to her prior imaging.  There is no inflammatory changes around the appendix.  Overall her abdominal exam is benign.  She did have some renal cysts which will need outpatient follow-up.   Discussed the findings with the patient.  This could represent musculoskeletal pain versus early appendicitis.  Encouraged her to have follow-up with her PCP for recheck.  Advised her that she will need to have her lipase rechecked as well as a renal ultrasound that can be arranged by her PCP.  Return precautions were given.  She was given a prescription for small amount of Percocet.  She reports an allergy  to hydrocodone  although she said she is taking oxycodone  without difficulty.  Final Clinical Impression(s) / ED Diagnoses Final diagnoses:  Right flank pain  Multiple renal cysts  Elevated lipase    Rx / DC Orders ED Discharge Orders          Ordered    oxyCODONE -acetaminophen  (PERCOCET/ROXICET) 5-325 MG tablet  Every 6 hours PRN        12/05/23 1423              Lenor Hollering, MD 12/05/23 1426    Lenor Hollering, MD 12/05/23 1427

## 2023-12-05 NOTE — ED Triage Notes (Signed)
 Pt c/o sharp pain that started this am in R flank and R abdomen. Denies N/V/D. Hx IBS

## 2023-12-12 DIAGNOSIS — F322 Major depressive disorder, single episode, severe without psychotic features: Secondary | ICD-10-CM | POA: Diagnosis not present

## 2023-12-12 DIAGNOSIS — N289 Disorder of kidney and ureter, unspecified: Secondary | ICD-10-CM | POA: Diagnosis not present

## 2023-12-12 DIAGNOSIS — N950 Postmenopausal bleeding: Secondary | ICD-10-CM | POA: Diagnosis not present

## 2023-12-12 DIAGNOSIS — M329 Systemic lupus erythematosus, unspecified: Secondary | ICD-10-CM | POA: Diagnosis not present

## 2023-12-12 DIAGNOSIS — R1030 Lower abdominal pain, unspecified: Secondary | ICD-10-CM | POA: Diagnosis not present

## 2023-12-13 ENCOUNTER — Other Ambulatory Visit: Payer: Self-pay | Admitting: Family Medicine

## 2023-12-13 DIAGNOSIS — N950 Postmenopausal bleeding: Secondary | ICD-10-CM

## 2023-12-14 ENCOUNTER — Ambulatory Visit
Admission: RE | Admit: 2023-12-14 | Discharge: 2023-12-14 | Disposition: A | Discharge status: Home / Self Care | Payer: Medicare HMO | Source: Ambulatory Visit | Attending: Family Medicine | Admitting: Family Medicine

## 2023-12-14 DIAGNOSIS — D259 Leiomyoma of uterus, unspecified: Secondary | ICD-10-CM | POA: Diagnosis not present

## 2023-12-14 DIAGNOSIS — N950 Postmenopausal bleeding: Secondary | ICD-10-CM | POA: Diagnosis not present

## 2023-12-17 DIAGNOSIS — M545 Low back pain, unspecified: Secondary | ICD-10-CM | POA: Diagnosis not present

## 2023-12-17 DIAGNOSIS — N950 Postmenopausal bleeding: Secondary | ICD-10-CM | POA: Diagnosis not present

## 2023-12-19 DIAGNOSIS — R1031 Right lower quadrant pain: Secondary | ICD-10-CM | POA: Diagnosis not present

## 2023-12-20 ENCOUNTER — Other Ambulatory Visit: Payer: Self-pay | Admitting: Family Medicine

## 2023-12-20 DIAGNOSIS — N289 Disorder of kidney and ureter, unspecified: Secondary | ICD-10-CM

## 2023-12-24 ENCOUNTER — Ambulatory Visit
Admission: RE | Admit: 2023-12-24 | Discharge: 2023-12-24 | Disposition: A | Discharge status: Home / Self Care | Payer: Medicare HMO | Source: Ambulatory Visit | Attending: Family Medicine | Admitting: Family Medicine

## 2023-12-24 DIAGNOSIS — N289 Disorder of kidney and ureter, unspecified: Secondary | ICD-10-CM

## 2023-12-24 DIAGNOSIS — N281 Cyst of kidney, acquired: Secondary | ICD-10-CM | POA: Diagnosis not present

## 2023-12-26 DIAGNOSIS — C44722 Squamous cell carcinoma of skin of right lower limb, including hip: Secondary | ICD-10-CM | POA: Diagnosis not present

## 2023-12-26 DIAGNOSIS — L821 Other seborrheic keratosis: Secondary | ICD-10-CM | POA: Diagnosis not present

## 2023-12-26 DIAGNOSIS — D485 Neoplasm of uncertain behavior of skin: Secondary | ICD-10-CM | POA: Diagnosis not present

## 2024-01-15 DIAGNOSIS — M329 Systemic lupus erythematosus, unspecified: Secondary | ICD-10-CM | POA: Diagnosis not present

## 2024-01-21 DIAGNOSIS — J189 Pneumonia, unspecified organism: Secondary | ICD-10-CM | POA: Diagnosis not present

## 2024-01-24 DIAGNOSIS — J189 Pneumonia, unspecified organism: Secondary | ICD-10-CM | POA: Diagnosis not present

## 2024-01-28 DIAGNOSIS — C44722 Squamous cell carcinoma of skin of right lower limb, including hip: Secondary | ICD-10-CM | POA: Diagnosis not present

## 2024-01-29 DIAGNOSIS — E2740 Unspecified adrenocortical insufficiency: Secondary | ICD-10-CM | POA: Diagnosis not present

## 2024-01-29 DIAGNOSIS — E039 Hypothyroidism, unspecified: Secondary | ICD-10-CM | POA: Diagnosis not present

## 2024-01-29 DIAGNOSIS — Z8701 Personal history of pneumonia (recurrent): Secondary | ICD-10-CM | POA: Diagnosis not present

## 2024-02-14 DIAGNOSIS — Z124 Encounter for screening for malignant neoplasm of cervix: Secondary | ICD-10-CM | POA: Diagnosis not present

## 2024-02-14 DIAGNOSIS — Z0000 Encounter for general adult medical examination without abnormal findings: Secondary | ICD-10-CM | POA: Diagnosis not present

## 2024-02-14 DIAGNOSIS — Z01411 Encounter for gynecological examination (general) (routine) with abnormal findings: Secondary | ICD-10-CM | POA: Diagnosis not present

## 2024-02-14 DIAGNOSIS — Z1331 Encounter for screening for depression: Secondary | ICD-10-CM | POA: Diagnosis not present

## 2024-02-18 ENCOUNTER — Ambulatory Visit: Admitting: Licensed Clinical Social Worker

## 2024-02-27 ENCOUNTER — Ambulatory Visit: Admitting: Licensed Clinical Social Worker

## 2024-02-27 DIAGNOSIS — F4323 Adjustment disorder with mixed anxiety and depressed mood: Secondary | ICD-10-CM
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
## 2024-02-27 NOTE — Progress Notes (Signed)
 [facility] Counselor/Therapist Progress Note  Patient ID: Crystal Larsen, MRN: [id number],    Date: 02/27/2024  Individualized Treatment Plan Strengths: "I like to help others."  Supports: "A group of ladies from my old church."   Goal/Needs for Treatment:  In order of importance to patient 1) "I want to be happier." 2) "I want to be more motivated to complete tasks." 3) "I want to be more engaged socially."   Client Statement of Needs: "I need courage."   Treatment Level: Moderate  Symptoms:Intrusive thoughts, agitation, irritability and excessive worry, lack of motivation  Client Treatment Preferences: Face to Face/Cognitive Behavioral Therapy   Healthcare consumer's goal for treatment:  Counselor, Phyllis Ginger MSW will support the patient's ability to achieve the goals identified. Cognitive Behavioral Therapy, Assertive Communication/Conflict Resolution Training, Relaxation Training, ACT, Humanistic and other evidenced-based practices will be used to promote progress towards healthy functioning.   Healthcare consumer will: Actively participate in therapy, working towards healthy functioning.    *Justification for Continuation/Discontinuation of Goal: R=Revised, O=Ongoing, A=Achieved, D=Discontinued  Goal 1) "I want to be happier." Baseline date 02/27/2024: Progress towards goal Ongoing; How Often - Daily Target Date Goal Was reviewed Status Code Progress towards goal/Likert rating  02/26/2025  O             "Being happy", can be broken down by enhancing overall well-being, building resilience, and developing healthy coping mechanisms rather than solely focusing on a fleeting emotional state. EXAMPLES:  Shifting Focus: Instead of aiming for a constant state of happiness, therapy can help you develop strategies to navigate difficult emotions and find meaning and purpose in life.  Common Therapy Goals: Self-Awareness: Understanding your thoughts, feelings, and  behaviors, and how they impact your life.  Emotional Regulation: Developing skills to manage and express emotions in a healthy way.  Resilience: Building the ability to bounce back from challenges and setbacks.  Improved Communication: Enhancing your ability to communicate your needs and boundaries effectively.  Self-Acceptance: Developing a positive self-image and accepting your strengths and weaknesses.  Meaning and Purpose: Exploring your values, interests, and goals to find a sense of purpose.  Practical Strategies: Mindfulness and Meditation: Practicing mindfulness can help you become more aware of your thoughts and feelings in the present moment.  Gratitude: Regularly reflecting on the good things in your life can foster a sense of appreciation and positivity.  Self-Care: Engaging in activities that nourish your mind, body, and spirit.  Building Connections: Cultivating meaningful relationships with others can provide support and a sense of belonging.   Goal 2) "I want to be more motivated to complete tasks." Baseline date 02/27/2024 Progress towards goal Ongoing; How Often - Daily Target Date Goal Was reviewed Status Code Progress towards goal  02/26/2025  O             Specific: "I will complete 3 daily tasks, such as making my bed, taking out the trash, and doing 10 minutes of exercise." Measurable: "I will track my completion of these tasks using a daily checklist." Achievable: "I will start with 3 tasks and gradually increase the number as I feel more motivated." Relevant: "Completing these tasks will help me feel more productive and reduce feelings of overwhelm." Time-bound: "I will work towards this goal for the next 4 weeks."  Goal 3) "I want to be more engaged socially." Baseline date 02/27/2024: Progress towards goal Ongoing; How Often - Daily Target Date Goal Was reviewed Status Code Progress towards goal  02/26/2025  O             Identify interests and activities: Explore  hobbies, clubs, or events that align with your interests and provide opportunities for social interaction.  Practice initiating conversations: Start with simple greetings or questions, and gradually work towards more engaging conversations.  Learn active listening and empathy skills: Pay attention to others' perspectives and responses, and practice expressing your own thoughts and feelings.  Seek feedback and reflection: Regularly discuss your experiences and challenges with your therapist, and reflect on areas for improvement.  Celebrate small victories: Acknowledge and appreciate your progress, which can help maintain motivation and build confidence.  This plan has been reviewed and created by the following participants:  This plan will be reviewed at least every 12 months. Date Behavioral Health Clinician Date Guardian/Patient   02/27/2024 Phyllis Ginger MSW,LCSW  02/27/2024 Verbal Consent Provided                  Interventions: Cognitive Behavioral Therapy, Assertive Communication/Conflict Resolution Training, Relaxation Training, ACT, Humanistic and other evidenced-based practices will be used to promote progress towards healthy functioning.   Diagnosis: Adjustment Disorder with mixed depression and anxiety   Phyllis Ginger MSW, LCSW/DATE 02/27/2024

## 2024-03-05 ENCOUNTER — Ambulatory Visit
Admission: RE | Admit: 2024-03-05 | Discharge: 2024-03-05 | Disposition: A | Discharge status: Home / Self Care | Source: Ambulatory Visit | Attending: Internal Medicine | Admitting: Internal Medicine

## 2024-03-05 ENCOUNTER — Other Ambulatory Visit: Payer: Self-pay | Admitting: Internal Medicine

## 2024-03-05 DIAGNOSIS — Z8701 Personal history of pneumonia (recurrent): Secondary | ICD-10-CM

## 2024-03-05 DIAGNOSIS — J189 Pneumonia, unspecified organism: Secondary | ICD-10-CM | POA: Diagnosis not present

## 2024-03-05 DIAGNOSIS — Z48817 Encounter for surgical aftercare following surgery on the skin and subcutaneous tissue: Secondary | ICD-10-CM | POA: Diagnosis not present

## 2024-03-12 ENCOUNTER — Ambulatory Visit: Admitting: Licensed Clinical Social Worker

## 2024-03-20 DIAGNOSIS — J069 Acute upper respiratory infection, unspecified: Secondary | ICD-10-CM | POA: Diagnosis not present

## 2024-03-20 DIAGNOSIS — R0981 Nasal congestion: Secondary | ICD-10-CM | POA: Diagnosis not present

## 2024-03-26 ENCOUNTER — Ambulatory Visit: Admitting: Licensed Clinical Social Worker

## 2024-03-27 DIAGNOSIS — T485X5A Adverse effect of other anti-common-cold drugs, initial encounter: Secondary | ICD-10-CM | POA: Diagnosis not present

## 2024-03-27 DIAGNOSIS — J019 Acute sinusitis, unspecified: Secondary | ICD-10-CM | POA: Diagnosis not present

## 2024-03-27 DIAGNOSIS — R0981 Nasal congestion: Secondary | ICD-10-CM | POA: Diagnosis not present

## 2024-04-03 DIAGNOSIS — M064 Inflammatory polyarthropathy: Secondary | ICD-10-CM | POA: Diagnosis not present

## 2024-04-03 DIAGNOSIS — M1991 Primary osteoarthritis, unspecified site: Secondary | ICD-10-CM | POA: Diagnosis not present

## 2024-04-03 DIAGNOSIS — M545 Low back pain, unspecified: Secondary | ICD-10-CM | POA: Diagnosis not present

## 2024-04-03 DIAGNOSIS — Z6827 Body mass index (BMI) 27.0-27.9, adult: Secondary | ICD-10-CM | POA: Diagnosis not present

## 2024-04-03 DIAGNOSIS — M329 Systemic lupus erythematosus, unspecified: Secondary | ICD-10-CM | POA: Diagnosis not present

## 2024-04-03 DIAGNOSIS — E2740 Unspecified adrenocortical insufficiency: Secondary | ICD-10-CM | POA: Diagnosis not present

## 2024-04-03 DIAGNOSIS — E663 Overweight: Secondary | ICD-10-CM | POA: Diagnosis not present

## 2024-04-03 DIAGNOSIS — M3214 Glomerular disease in systemic lupus erythematosus: Secondary | ICD-10-CM | POA: Diagnosis not present

## 2024-04-07 ENCOUNTER — Ambulatory Visit: Admitting: Licensed Clinical Social Worker

## 2024-04-11 DIAGNOSIS — T485X5A Adverse effect of other anti-common-cold drugs, initial encounter: Secondary | ICD-10-CM | POA: Diagnosis not present

## 2024-04-11 DIAGNOSIS — R0981 Nasal congestion: Secondary | ICD-10-CM | POA: Diagnosis not present

## 2024-04-11 DIAGNOSIS — J019 Acute sinusitis, unspecified: Secondary | ICD-10-CM | POA: Diagnosis not present

## 2024-04-11 DIAGNOSIS — J329 Chronic sinusitis, unspecified: Secondary | ICD-10-CM | POA: Diagnosis not present

## 2024-04-22 DIAGNOSIS — M25551 Pain in right hip: Secondary | ICD-10-CM | POA: Diagnosis not present

## 2024-04-22 DIAGNOSIS — M545 Low back pain, unspecified: Secondary | ICD-10-CM | POA: Diagnosis not present

## 2024-04-24 DIAGNOSIS — N057 Unspecified nephritic syndrome with diffuse crescentic glomerulonephritis: Secondary | ICD-10-CM | POA: Diagnosis not present

## 2024-04-24 DIAGNOSIS — I129 Hypertensive chronic kidney disease with stage 1 through stage 4 chronic kidney disease, or unspecified chronic kidney disease: Secondary | ICD-10-CM | POA: Diagnosis not present

## 2024-04-24 DIAGNOSIS — N182 Chronic kidney disease, stage 2 (mild): Secondary | ICD-10-CM | POA: Diagnosis not present

## 2024-04-24 DIAGNOSIS — E2740 Unspecified adrenocortical insufficiency: Secondary | ICD-10-CM | POA: Diagnosis not present

## 2024-04-24 DIAGNOSIS — R809 Proteinuria, unspecified: Secondary | ICD-10-CM | POA: Diagnosis not present

## 2024-04-24 DIAGNOSIS — D631 Anemia in chronic kidney disease: Secondary | ICD-10-CM | POA: Diagnosis not present

## 2024-04-24 DIAGNOSIS — N2581 Secondary hyperparathyroidism of renal origin: Secondary | ICD-10-CM | POA: Diagnosis not present

## 2024-04-24 DIAGNOSIS — N058 Unspecified nephritic syndrome with other morphologic changes: Secondary | ICD-10-CM | POA: Diagnosis not present

## 2024-04-24 DIAGNOSIS — M3214 Glomerular disease in systemic lupus erythematosus: Secondary | ICD-10-CM | POA: Diagnosis not present

## 2024-04-28 DIAGNOSIS — M79602 Pain in left arm: Secondary | ICD-10-CM | POA: Diagnosis not present

## 2024-04-28 DIAGNOSIS — M545 Low back pain, unspecified: Secondary | ICD-10-CM | POA: Diagnosis not present

## 2024-05-05 DIAGNOSIS — S42212A Unspecified displaced fracture of surgical neck of left humerus, initial encounter for closed fracture: Secondary | ICD-10-CM | POA: Diagnosis not present

## 2024-05-19 ENCOUNTER — Other Ambulatory Visit: Payer: Self-pay | Admitting: Family Medicine

## 2024-05-19 DIAGNOSIS — M79602 Pain in left arm: Secondary | ICD-10-CM

## 2024-06-06 DIAGNOSIS — Z6827 Body mass index (BMI) 27.0-27.9, adult: Secondary | ICD-10-CM | POA: Diagnosis not present

## 2024-06-06 DIAGNOSIS — M48062 Spinal stenosis, lumbar region with neurogenic claudication: Secondary | ICD-10-CM | POA: Diagnosis not present

## 2024-06-06 DIAGNOSIS — M4316 Spondylolisthesis, lumbar region: Secondary | ICD-10-CM | POA: Diagnosis not present

## 2024-06-09 ENCOUNTER — Other Ambulatory Visit: Payer: Self-pay | Admitting: Neurological Surgery

## 2024-06-09 DIAGNOSIS — M48062 Spinal stenosis, lumbar region with neurogenic claudication: Secondary | ICD-10-CM

## 2024-06-10 ENCOUNTER — Encounter: Payer: Self-pay | Admitting: Neurological Surgery

## 2024-06-12 ENCOUNTER — Other Ambulatory Visit

## 2024-06-20 DIAGNOSIS — L280 Lichen simplex chronicus: Secondary | ICD-10-CM | POA: Diagnosis not present

## 2024-06-20 DIAGNOSIS — D485 Neoplasm of uncertain behavior of skin: Secondary | ICD-10-CM | POA: Diagnosis not present

## 2024-06-20 DIAGNOSIS — L2089 Other atopic dermatitis: Secondary | ICD-10-CM | POA: Diagnosis not present

## 2024-06-20 DIAGNOSIS — I788 Other diseases of capillaries: Secondary | ICD-10-CM | POA: Diagnosis not present

## 2024-06-20 DIAGNOSIS — Z872 Personal history of diseases of the skin and subcutaneous tissue: Secondary | ICD-10-CM | POA: Diagnosis not present

## 2024-06-20 DIAGNOSIS — L814 Other melanin hyperpigmentation: Secondary | ICD-10-CM | POA: Diagnosis not present

## 2024-06-20 DIAGNOSIS — L432 Lichenoid drug reaction: Secondary | ICD-10-CM | POA: Diagnosis not present

## 2024-06-20 DIAGNOSIS — D225 Melanocytic nevi of trunk: Secondary | ICD-10-CM | POA: Diagnosis not present

## 2024-06-20 DIAGNOSIS — L821 Other seborrheic keratosis: Secondary | ICD-10-CM | POA: Diagnosis not present

## 2024-06-20 DIAGNOSIS — B079 Viral wart, unspecified: Secondary | ICD-10-CM | POA: Diagnosis not present

## 2024-06-23 ENCOUNTER — Ambulatory Visit
Admission: RE | Admit: 2024-06-23 | Discharge: 2024-06-23 | Disposition: A | Discharge status: Home / Self Care | Source: Ambulatory Visit | Attending: Neurological Surgery | Admitting: Neurological Surgery

## 2024-06-23 DIAGNOSIS — M48062 Spinal stenosis, lumbar region with neurogenic claudication: Secondary | ICD-10-CM

## 2024-06-23 MED ORDER — GADOPICLENOL 0.5 MMOL/ML IV SOLN
7.5000 mL | Freq: Once | INTRAVENOUS | Status: AC | PRN
  Administered 2024-06-23: 7.5 mL via INTRAVENOUS

## 2024-07-02 DIAGNOSIS — M48062 Spinal stenosis, lumbar region with neurogenic claudication: Secondary | ICD-10-CM | POA: Diagnosis not present

## 2024-07-02 DIAGNOSIS — Z6828 Body mass index (BMI) 28.0-28.9, adult: Secondary | ICD-10-CM | POA: Diagnosis not present

## 2024-07-07 DIAGNOSIS — Z79899 Other long term (current) drug therapy: Secondary | ICD-10-CM | POA: Diagnosis not present

## 2024-07-07 DIAGNOSIS — Z961 Presence of intraocular lens: Secondary | ICD-10-CM | POA: Diagnosis not present

## 2024-07-07 DIAGNOSIS — H52203 Unspecified astigmatism, bilateral: Secondary | ICD-10-CM | POA: Diagnosis not present

## 2024-07-15 DIAGNOSIS — Z1231 Encounter for screening mammogram for malignant neoplasm of breast: Secondary | ICD-10-CM | POA: Diagnosis not present

## 2024-07-29 DIAGNOSIS — M5415 Radiculopathy, thoracolumbar region: Secondary | ICD-10-CM | POA: Diagnosis not present

## 2024-07-29 DIAGNOSIS — M5115 Intervertebral disc disorders with radiculopathy, thoracolumbar region: Secondary | ICD-10-CM | POA: Diagnosis not present

## 2024-07-31 DIAGNOSIS — E2740 Unspecified adrenocortical insufficiency: Secondary | ICD-10-CM | POA: Diagnosis not present

## 2024-08-20 DIAGNOSIS — M48062 Spinal stenosis, lumbar region with neurogenic claudication: Secondary | ICD-10-CM | POA: Diagnosis not present

## 2024-08-20 DIAGNOSIS — M47814 Spondylosis without myelopathy or radiculopathy, thoracic region: Secondary | ICD-10-CM | POA: Diagnosis not present

## 2024-08-20 DIAGNOSIS — Z6828 Body mass index (BMI) 28.0-28.9, adult: Secondary | ICD-10-CM | POA: Diagnosis not present

## 2024-08-26 ENCOUNTER — Encounter: Payer: Self-pay | Admitting: Neurological Surgery

## 2024-08-26 ENCOUNTER — Other Ambulatory Visit: Payer: Self-pay | Admitting: Neurological Surgery

## 2024-08-26 DIAGNOSIS — M47814 Spondylosis without myelopathy or radiculopathy, thoracic region: Secondary | ICD-10-CM

## 2024-09-03 DIAGNOSIS — E7800 Pure hypercholesterolemia, unspecified: Secondary | ICD-10-CM | POA: Diagnosis not present

## 2024-09-03 DIAGNOSIS — I10 Essential (primary) hypertension: Secondary | ICD-10-CM | POA: Diagnosis not present

## 2024-09-03 DIAGNOSIS — D509 Iron deficiency anemia, unspecified: Secondary | ICD-10-CM | POA: Diagnosis not present

## 2024-09-05 ENCOUNTER — Ambulatory Visit
Admission: RE | Admit: 2024-09-05 | Discharge: 2024-09-05 | Disposition: A | Discharge status: Home / Self Care | Source: Ambulatory Visit | Attending: Neurological Surgery | Admitting: Neurological Surgery

## 2024-09-05 DIAGNOSIS — M4314 Spondylolisthesis, thoracic region: Secondary | ICD-10-CM | POA: Diagnosis not present

## 2024-09-05 DIAGNOSIS — M47814 Spondylosis without myelopathy or radiculopathy, thoracic region: Secondary | ICD-10-CM

## 2024-09-05 DIAGNOSIS — M5124 Other intervertebral disc displacement, thoracic region: Secondary | ICD-10-CM | POA: Diagnosis not present

## 2024-09-05 DIAGNOSIS — M4804 Spinal stenosis, thoracic region: Secondary | ICD-10-CM | POA: Diagnosis not present

## 2024-09-12 DIAGNOSIS — M48062 Spinal stenosis, lumbar region with neurogenic claudication: Secondary | ICD-10-CM | POA: Diagnosis not present

## 2024-09-12 DIAGNOSIS — M47814 Spondylosis without myelopathy or radiculopathy, thoracic region: Secondary | ICD-10-CM | POA: Diagnosis not present

## 2024-09-12 DIAGNOSIS — Z6828 Body mass index (BMI) 28.0-28.9, adult: Secondary | ICD-10-CM | POA: Diagnosis not present

## 2024-09-29 DIAGNOSIS — Z0100 Encounter for examination of eyes and vision without abnormal findings: Secondary | ICD-10-CM | POA: Diagnosis not present

## 2024-09-29 DIAGNOSIS — H43811 Vitreous degeneration, right eye: Secondary | ICD-10-CM | POA: Diagnosis not present

## 2024-09-30 DIAGNOSIS — L2089 Other atopic dermatitis: Secondary | ICD-10-CM | POA: Diagnosis not present

## 2024-09-30 DIAGNOSIS — L280 Lichen simplex chronicus: Secondary | ICD-10-CM | POA: Diagnosis not present

## 2024-10-01 DIAGNOSIS — M329 Systemic lupus erythematosus, unspecified: Secondary | ICD-10-CM | POA: Diagnosis not present

## 2024-10-01 DIAGNOSIS — Z79899 Other long term (current) drug therapy: Secondary | ICD-10-CM | POA: Diagnosis not present

## 2024-10-02 DIAGNOSIS — M064 Inflammatory polyarthropathy: Secondary | ICD-10-CM | POA: Diagnosis not present

## 2024-10-02 DIAGNOSIS — M1991 Primary osteoarthritis, unspecified site: Secondary | ICD-10-CM | POA: Diagnosis not present

## 2024-10-02 DIAGNOSIS — E663 Overweight: Secondary | ICD-10-CM | POA: Diagnosis not present

## 2024-10-02 DIAGNOSIS — M3214 Glomerular disease in systemic lupus erythematosus: Secondary | ICD-10-CM | POA: Diagnosis not present

## 2024-10-02 DIAGNOSIS — M5459 Other low back pain: Secondary | ICD-10-CM | POA: Diagnosis not present

## 2024-10-02 DIAGNOSIS — M329 Systemic lupus erythematosus, unspecified: Secondary | ICD-10-CM | POA: Diagnosis not present

## 2024-10-02 DIAGNOSIS — Z6828 Body mass index (BMI) 28.0-28.9, adult: Secondary | ICD-10-CM | POA: Diagnosis not present

## 2024-10-02 DIAGNOSIS — E2740 Unspecified adrenocortical insufficiency: Secondary | ICD-10-CM | POA: Diagnosis not present

## 2024-10-02 DIAGNOSIS — Z79899 Other long term (current) drug therapy: Secondary | ICD-10-CM | POA: Diagnosis not present

## 2024-10-13 DIAGNOSIS — N182 Chronic kidney disease, stage 2 (mild): Secondary | ICD-10-CM | POA: Diagnosis not present

## 2024-10-15 DIAGNOSIS — N182 Chronic kidney disease, stage 2 (mild): Secondary | ICD-10-CM | POA: Diagnosis not present

## 2024-10-20 DIAGNOSIS — H43811 Vitreous degeneration, right eye: Secondary | ICD-10-CM | POA: Diagnosis not present

## 2024-10-22 DIAGNOSIS — N057 Unspecified nephritic syndrome with diffuse crescentic glomerulonephritis: Secondary | ICD-10-CM | POA: Diagnosis not present

## 2024-10-22 DIAGNOSIS — N2581 Secondary hyperparathyroidism of renal origin: Secondary | ICD-10-CM | POA: Diagnosis not present

## 2024-10-22 DIAGNOSIS — N182 Chronic kidney disease, stage 2 (mild): Secondary | ICD-10-CM | POA: Diagnosis not present

## 2024-10-22 DIAGNOSIS — I10 Essential (primary) hypertension: Secondary | ICD-10-CM | POA: Diagnosis not present

## 2024-10-22 DIAGNOSIS — D631 Anemia in chronic kidney disease: Secondary | ICD-10-CM | POA: Diagnosis not present

## 2024-10-22 DIAGNOSIS — N058 Unspecified nephritic syndrome with other morphologic changes: Secondary | ICD-10-CM | POA: Diagnosis not present

## 2024-10-22 DIAGNOSIS — R809 Proteinuria, unspecified: Secondary | ICD-10-CM | POA: Diagnosis not present

## 2024-10-22 DIAGNOSIS — M3214 Glomerular disease in systemic lupus erythematosus: Secondary | ICD-10-CM | POA: Diagnosis not present

## 2024-10-22 DIAGNOSIS — D849 Immunodeficiency, unspecified: Secondary | ICD-10-CM | POA: Diagnosis not present
